# Patient Record
Sex: Female | Born: 1935 | Race: White | Smoking: Former smoker
Health system: Southern US, Community
[De-identification: ages and names within clinical notes are randomized; demographics above are authoritative.]

## PROBLEM LIST (undated history)

## (undated) DIAGNOSIS — I639 Cerebral infarction, unspecified: Secondary | ICD-10-CM

## (undated) DIAGNOSIS — F419 Anxiety disorder, unspecified: Secondary | ICD-10-CM

## (undated) DIAGNOSIS — I2510 Atherosclerotic heart disease of native coronary artery without angina pectoris: Secondary | ICD-10-CM

## (undated) DIAGNOSIS — I10 Essential (primary) hypertension: Secondary | ICD-10-CM

## (undated) DIAGNOSIS — T7840XA Allergy, unspecified, initial encounter: Secondary | ICD-10-CM

## (undated) DIAGNOSIS — K219 Gastro-esophageal reflux disease without esophagitis: Secondary | ICD-10-CM

## (undated) DIAGNOSIS — K5790 Diverticulosis of intestine, part unspecified, without perforation or abscess without bleeding: Secondary | ICD-10-CM

## (undated) DIAGNOSIS — G459 Transient cerebral ischemic attack, unspecified: Secondary | ICD-10-CM

## (undated) DIAGNOSIS — R319 Hematuria, unspecified: Secondary | ICD-10-CM

## (undated) DIAGNOSIS — E785 Hyperlipidemia, unspecified: Secondary | ICD-10-CM

## (undated) DIAGNOSIS — N289 Disorder of kidney and ureter, unspecified: Secondary | ICD-10-CM

## (undated) HISTORY — PX: APPENDECTOMY: SHX54

## (undated) HISTORY — DX: Anxiety disorder, unspecified: F41.9

## (undated) HISTORY — PX: TONSILLECTOMY: SUR1361

## (undated) HISTORY — DX: Allergy, unspecified, initial encounter: T78.40XA

## (undated) HISTORY — DX: Atherosclerotic heart disease of native coronary artery without angina pectoris: I25.10

## (undated) HISTORY — DX: Gastro-esophageal reflux disease without esophagitis: K21.9

## (undated) HISTORY — PX: EYE SURGERY: SHX253

## (undated) HISTORY — DX: Diverticulosis of intestine, part unspecified, without perforation or abscess without bleeding: K57.90

## (undated) HISTORY — PX: CHOLECYSTECTOMY: SHX55

## (undated) HISTORY — PX: ABDOMINAL HYSTERECTOMY: SHX81

## (undated) HISTORY — DX: Transient cerebral ischemic attack, unspecified: G45.9

## (undated) HISTORY — DX: Hyperlipidemia, unspecified: E78.5

## (undated) HISTORY — DX: Cerebral infarction, unspecified: I63.9

## (undated) HISTORY — PX: OTHER SURGICAL HISTORY: SHX169

## (undated) HISTORY — DX: Essential (primary) hypertension: I10

## (undated) HISTORY — DX: Hematuria, unspecified: R31.9

---
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
## 1998-09-22 ENCOUNTER — Encounter: Payer: Self-pay | Admitting: General Surgery

## 1998-09-24 ENCOUNTER — Observation Stay (HOSPITAL_COMMUNITY): Admission: RE | Admit: 1998-09-24 | Discharge: 1998-09-25 | Payer: Self-pay | Admitting: General Surgery

## 1999-02-03 ENCOUNTER — Ambulatory Visit (HOSPITAL_COMMUNITY): Admission: RE | Admit: 1999-02-03 | Discharge: 1999-02-03 | Payer: Self-pay | Admitting: Gastroenterology

## 2000-12-19 ENCOUNTER — Other Ambulatory Visit: Admission: RE | Admit: 2000-12-19 | Discharge: 2000-12-19 | Payer: Self-pay | Admitting: Family Medicine

## 2001-06-06 DIAGNOSIS — I639 Cerebral infarction, unspecified: Secondary | ICD-10-CM

## 2001-06-06 HISTORY — DX: Cerebral infarction, unspecified: I63.9

## 2001-06-25 ENCOUNTER — Emergency Department (HOSPITAL_COMMUNITY): Admission: EM | Admit: 2001-06-25 | Discharge: 2001-06-25 | Payer: Self-pay | Admitting: Emergency Medicine

## 2001-06-25 ENCOUNTER — Encounter: Payer: Self-pay | Admitting: Emergency Medicine

## 2001-06-25 ENCOUNTER — Encounter: Payer: Self-pay | Admitting: Neurology

## 2001-08-23 ENCOUNTER — Observation Stay (HOSPITAL_COMMUNITY): Admission: EM | Admit: 2001-08-23 | Discharge: 2001-08-24 | Payer: Self-pay | Admitting: *Deleted

## 2001-09-06 ENCOUNTER — Encounter: Admission: RE | Admit: 2001-09-06 | Discharge: 2001-09-06 | Payer: Self-pay | Admitting: Family Medicine

## 2004-06-21 ENCOUNTER — Ambulatory Visit (HOSPITAL_COMMUNITY): Admission: RE | Admit: 2004-06-21 | Discharge: 2004-06-21 | Payer: Self-pay | Admitting: Gastroenterology

## 2006-09-25 ENCOUNTER — Ambulatory Visit: Payer: Self-pay | Admitting: Cardiology

## 2006-09-25 LAB — CONVERTED CEMR LAB
BUN: 19 mg/dL (ref 6–23)
Basophils Relative: 1.3 % — ABNORMAL HIGH (ref 0.0–1.0)
CO2: 31 meq/L (ref 19–32)
Calcium: 10.3 mg/dL (ref 8.4–10.5)
GFR calc Af Amer: 52 mL/min
GFR calc non Af Amer: 43 mL/min
INR: 1 (ref 0.9–2.0)
Lymphocytes Relative: 37.5 % (ref 12.0–46.0)
Monocytes Relative: 12.6 % — ABNORMAL HIGH (ref 3.0–11.0)
Neutro Abs: 2.7 10*3/uL (ref 1.4–7.7)
Platelets: 226 10*3/uL (ref 150–400)
Prothrombin Time: 11.9 s (ref 10.0–14.0)
RBC: 4.36 M/uL (ref 3.87–5.11)

## 2006-09-26 ENCOUNTER — Inpatient Hospital Stay (HOSPITAL_BASED_OUTPATIENT_CLINIC_OR_DEPARTMENT_OTHER): Admission: RE | Admit: 2006-09-26 | Discharge: 2006-09-26 | Payer: Self-pay | Admitting: Internal Medicine

## 2006-09-26 ENCOUNTER — Ambulatory Visit: Payer: Self-pay | Admitting: Internal Medicine

## 2006-09-29 ENCOUNTER — Encounter: Payer: Self-pay | Admitting: Cardiology

## 2006-09-29 ENCOUNTER — Ambulatory Visit: Payer: Self-pay | Admitting: Cardiology

## 2006-09-29 ENCOUNTER — Ambulatory Visit (HOSPITAL_COMMUNITY): Admission: RE | Admit: 2006-09-29 | Discharge: 2006-09-29 | Payer: Self-pay | Admitting: Cardiology

## 2006-10-06 ENCOUNTER — Ambulatory Visit: Payer: Self-pay

## 2006-10-06 ENCOUNTER — Encounter: Payer: Self-pay | Admitting: Internal Medicine

## 2006-10-13 ENCOUNTER — Ambulatory Visit: Payer: Self-pay | Admitting: Internal Medicine

## 2007-01-30 ENCOUNTER — Inpatient Hospital Stay (HOSPITAL_COMMUNITY): Admission: EM | Admit: 2007-01-30 | Discharge: 2007-02-01 | Payer: Self-pay | Admitting: Emergency Medicine

## 2007-01-30 ENCOUNTER — Ambulatory Visit: Payer: Self-pay | Admitting: Cardiology

## 2007-02-19 ENCOUNTER — Ambulatory Visit: Payer: Self-pay

## 2008-01-21 LAB — CONVERTED CEMR LAB: LDL Cholesterol: 30 mg/dL

## 2008-07-15 ENCOUNTER — Ambulatory Visit: Payer: Self-pay | Admitting: Cardiology

## 2008-07-15 LAB — CONVERTED CEMR LAB: TSH: 2.14 microintl units/mL (ref 0.35–5.50)

## 2008-07-31 ENCOUNTER — Encounter: Payer: Self-pay | Admitting: Cardiology

## 2008-07-31 ENCOUNTER — Ambulatory Visit: Payer: Self-pay | Admitting: Cardiology

## 2008-07-31 LAB — CONVERTED CEMR LAB
Cholesterol: 116 mg/dL
LDL Cholesterol: 30 mg/dL

## 2008-08-07 ENCOUNTER — Ambulatory Visit: Payer: Self-pay

## 2008-09-22 DIAGNOSIS — Z8673 Personal history of transient ischemic attack (TIA), and cerebral infarction without residual deficits: Secondary | ICD-10-CM | POA: Insufficient documentation

## 2008-09-22 DIAGNOSIS — I10 Essential (primary) hypertension: Secondary | ICD-10-CM

## 2008-09-22 DIAGNOSIS — K5730 Diverticulosis of large intestine without perforation or abscess without bleeding: Secondary | ICD-10-CM | POA: Insufficient documentation

## 2008-09-22 DIAGNOSIS — E785 Hyperlipidemia, unspecified: Secondary | ICD-10-CM

## 2008-09-22 DIAGNOSIS — I6350 Cerebral infarction due to unspecified occlusion or stenosis of unspecified cerebral artery: Secondary | ICD-10-CM | POA: Insufficient documentation

## 2008-09-22 DIAGNOSIS — K219 Gastro-esophageal reflux disease without esophagitis: Secondary | ICD-10-CM

## 2008-09-22 DIAGNOSIS — I2510 Atherosclerotic heart disease of native coronary artery without angina pectoris: Secondary | ICD-10-CM | POA: Insufficient documentation

## 2008-09-24 ENCOUNTER — Ambulatory Visit: Payer: Self-pay | Admitting: Cardiology

## 2008-10-09 ENCOUNTER — Encounter: Payer: Self-pay | Admitting: Cardiology

## 2008-10-09 ENCOUNTER — Ambulatory Visit: Payer: Self-pay

## 2008-10-22 ENCOUNTER — Ambulatory Visit: Payer: Self-pay | Admitting: Cardiology

## 2008-10-23 ENCOUNTER — Telehealth: Payer: Self-pay | Admitting: Cardiology

## 2008-12-10 ENCOUNTER — Ambulatory Visit: Payer: Self-pay | Admitting: Cardiology

## 2009-04-02 ENCOUNTER — Encounter (INDEPENDENT_AMBULATORY_CARE_PROVIDER_SITE_OTHER): Payer: Self-pay | Admitting: *Deleted

## 2009-05-18 ENCOUNTER — Encounter: Admission: RE | Admit: 2009-05-18 | Discharge: 2009-05-18 | Payer: Self-pay | Admitting: Neurology

## 2009-06-30 ENCOUNTER — Inpatient Hospital Stay (HOSPITAL_COMMUNITY)
Admission: EM | Admit: 2009-06-30 | Discharge: 2009-07-02 | Payer: Self-pay | Source: Home / Self Care | Admitting: Emergency Medicine

## 2009-12-02 ENCOUNTER — Ambulatory Visit: Payer: Self-pay | Admitting: Cardiology

## 2010-06-30 LAB — HEMOGLOBIN AND HEMATOCRIT, BLOOD
HCT: 37.3 % (ref 36.0–46.0)
Hemoglobin: 12.5 g/dL (ref 12.0–15.0)

## 2010-06-30 LAB — BASIC METABOLIC PANEL
Calcium: 10.3 mg/dL (ref 8.4–10.5)
Creatinine, Ser: 1.02 mg/dL (ref 0.4–1.2)
GFR calc Af Amer: >60 mL/min (ref >60)
GFR calc non Af Amer: 53 mL/min — ABNORMAL LOW (ref >60)

## 2010-07-08 ENCOUNTER — Ambulatory Visit (HOSPITAL_COMMUNITY)
Admission: RE | Admit: 2010-07-08 | Discharge: 2010-07-08 | Disposition: A | Discharge status: Home / Self Care | Payer: No Typology Code available for payment source | Attending: Ophthalmology | Admitting: Ophthalmology

## 2010-07-08 DIAGNOSIS — Z79899 Other long term (current) drug therapy: Secondary | ICD-10-CM | POA: Insufficient documentation

## 2010-07-08 DIAGNOSIS — H2510 Age-related nuclear cataract, unspecified eye: Secondary | ICD-10-CM | POA: Insufficient documentation

## 2010-07-08 DIAGNOSIS — Z7982 Long term (current) use of aspirin: Secondary | ICD-10-CM | POA: Insufficient documentation

## 2010-07-08 DIAGNOSIS — I10 Essential (primary) hypertension: Secondary | ICD-10-CM | POA: Insufficient documentation

## 2010-08-12 ENCOUNTER — Encounter (HOSPITAL_COMMUNITY): Payer: No Typology Code available for payment source | Attending: Ophthalmology

## 2010-08-12 DIAGNOSIS — Z01818 Encounter for other preprocedural examination: Secondary | ICD-10-CM | POA: Insufficient documentation

## 2010-08-16 ENCOUNTER — Ambulatory Visit (HOSPITAL_COMMUNITY)
Admission: RE | Admit: 2010-08-16 | Discharge: 2010-08-16 | Disposition: A | Discharge status: Home / Self Care | Payer: No Typology Code available for payment source | Source: Ambulatory Visit | Attending: Ophthalmology | Admitting: Ophthalmology

## 2010-08-16 DIAGNOSIS — I10 Essential (primary) hypertension: Secondary | ICD-10-CM | POA: Insufficient documentation

## 2010-08-16 DIAGNOSIS — Z79899 Other long term (current) drug therapy: Secondary | ICD-10-CM | POA: Insufficient documentation

## 2010-08-16 DIAGNOSIS — H2510 Age-related nuclear cataract, unspecified eye: Secondary | ICD-10-CM | POA: Insufficient documentation

## 2010-08-22 LAB — DIFFERENTIAL
Basophils Absolute: 0 10*3/uL (ref 0.0–0.1)
Basophils Relative: 0 % (ref 0–1)
Neutro Abs: 4 10*3/uL (ref 1.7–7.7)
Neutrophils Relative %: 55 % (ref 43–77)

## 2010-08-22 LAB — BASIC METABOLIC PANEL
BUN: 11 mg/dL (ref 6–23)
Calcium: 9.6 mg/dL (ref 8.4–10.5)
GFR calc non Af Amer: 54 mL/min — ABNORMAL LOW (ref >60)
Potassium: 4.9 mEq/L (ref 3.5–5.1)
Sodium: 142 mEq/L (ref 135–145)

## 2010-08-22 LAB — CBC
HCT: 31.3 % — ABNORMAL LOW (ref 36.0–46.0)
HCT: 31.8 % — ABNORMAL LOW (ref 36.0–46.0)
HCT: 36.3 % (ref 36.0–46.0)
Hemoglobin: 10.3 g/dL — ABNORMAL LOW (ref 12.0–15.0)
Hemoglobin: 12.1 g/dL (ref 12.0–15.0)
Platelets: 230 10*3/uL (ref 150–400)
RBC: 3.58 MIL/uL — ABNORMAL LOW (ref 3.87–5.11)
RDW: 15.8 % — ABNORMAL HIGH (ref 11.5–15.5)
WBC: 5 10*3/uL (ref 4.0–10.5)
WBC: 7.3 10*3/uL (ref 4.0–10.5)

## 2010-08-22 LAB — COMPREHENSIVE METABOLIC PANEL
ALT: 13 U/L (ref 0–35)
Alkaline Phosphatase: 79 U/L (ref 39–117)
Alkaline Phosphatase: 88 U/L (ref 39–117)
BUN: 12 mg/dL (ref 6–23)
BUN: 17 mg/dL (ref 6–23)
CO2: 25 mEq/L (ref 19–32)
CO2: 27 mEq/L (ref 19–32)
Chloride: 101 mEq/L (ref 96–112)
Chloride: 107 mEq/L (ref 96–112)
GFR calc non Af Amer: 57 mL/min — ABNORMAL LOW (ref >60)
Glucose, Bld: 113 mg/dL — ABNORMAL HIGH (ref 70–99)
Glucose, Bld: 116 mg/dL — ABNORMAL HIGH (ref 70–99)
Potassium: 3.8 mEq/L (ref 3.5–5.1)
Potassium: 4.3 mEq/L (ref 3.5–5.1)
Total Bilirubin: 0.2 mg/dL — ABNORMAL LOW (ref 0.3–1.2)
Total Bilirubin: 0.4 mg/dL (ref 0.3–1.2)
Total Protein: 6.1 g/dL (ref 6.0–8.3)

## 2010-10-19 NOTE — Assessment & Plan Note (Signed)
[facility]                            CARDIOLOGY OFFICE NOTE   NAME:Amanda [name], Amanda [name]                           MRN:          [id number]  DATE:12/10/2008                            DOB:          03/01/1931    REASON FOR PRESENTATION:  Evaluate the patient with hypertrophic  cardiomyopathy and shortness of breath.   HISTORY OF PRESENT ILLNESS:  The patient returns for followup of the  above.  At the last appointment, I stopped her Diovan and started  verapamil as she was complaining of dyspnea.  She says she feels much  better with this.  She is able to do her volunteering without the  limitation she was having.  She has had no presyncope or  lightheadedness.  She thinks she would be able to walk 50 yards on a  football field without getting dyspneic.  The only time she has a  problem is if she comes in from the heat and walks up some stairs she  may get some chest discomfort that goes away quickly when she stops.  She is otherwise not having any resting complaints.   PAST MEDICAL HISTORY:  1. Hypertrophic cardiomyopathy (EF 65-70%).  Peak outflow gradient 225      with Valsalva.  This was on echo in May 2010).  2. Coronary artery disease (see the Oct 22, 2008, note for details).  3. Hypertension.  4. Hyperlipidemia.  5. CVA in 2003 with left MCA occlusion.  6. Diverticulosis.  7. Gastroesophageal reflux disease.  8. Cholecystectomy.  9. Hysterectomy.  10.Appendectomy.  11.Tonsillectomy.   ALLERGIES:  PENICILLIN.   MEDICATIONS:  1. Aspirin 81 mg daily.  2. Lipitor 40 mg daily.  3. Fish oil.  4. Omeprazole.  5. Vitamin D.  6. Verapamil 120 mg daily.   REVIEW OF SYSTEMS:  As stated in the HPI and otherwise negative for all  other systems.   PHYSICAL EXAMINATION:  GENERAL:  The patient is in no distress.  VITAL SIGNS:  Blood pressure 130/66, heart rate 73 and regular, weight  150 pounds.  NECK:  No jugular venous distention at 45 degrees,  carotid upstroke  brisk and symmetric, no bruits, no thyromegaly.  LYMPHATICS:  No adenopathy.  LUNGS:  Clear to auscultation bilaterally.  HEART:  PMI not displaced or sustained, S1 and S2 within normal limits,  no S3, 2/6 apical systolic murmur, no diastolic murmurs.  ABDOMEN:  Flat, positive bowel sounds normal in frequency and pitch, no  bruits, no rebound, no guarding, no midline pulsatile mass, no  hepatomegaly, no splenomegaly.  SKIN:  No rashes, no nodules.  EXTREMITIES:  Pulses 2+, no edema, no cyanosis, no clubbing.  NEUROLOGIC:  Grossly intact throughout.   ASSESSMENT AND PLAN:  1. Hypertrophic cardiomyopathy.  The patient seems to be improved on      the current dose of verapamil female.  At this point, I will leave it      where it is though we could go up if she has continued symptoms.      She will let me know.  She will otherwise remain on the meds as      listed.  2. Hypertension.  Blood pressure seems to be controlled with this dose      of verapamil.  She will continue on this.  3. Coronary disease.  She will continue with risk reduction.  At this      point, no further cardiovascular testing is suggested.  She had no      scar or ischemia on Cardiolite in March 2010.   FOLLOWUP:  I will see her back in 6 months or sooner if needed.     Rollene Rotunda, MD, [facility]  Electronically Signed    JH/MedQ  DD: 12/10/2008  DT: 12/10/2008  Job #: [id number]   cc:   Ernestina Penna, M.D.

## 2010-10-19 NOTE — Assessment & Plan Note (Signed)
[facility]                            CARDIOLOGY OFFICE NOTE   Amanda [name], Amanda [name]                           MRN:          [id number]  DATE:09/24/2008                            DOB:          September 09, 1930    PRIMARY CARE PHYSICIAN:  Ernestina Penna, MD   REASON FOR PRESENTATION:  Evaluate the patient with shortness of breath  and chest discomfort.   HISTORY OF PRESENT ILLNESS:  The patient presents for followup of the  above.  When I last saw her in February I sent her for stress perfusion  study to evaluate her known coronary artery disease and the above  symptoms.  There was no evidence of scar or ischemia.  Her EF was 83%,  which was normal.  She states she is still having symptoms such as  dyspnea with any exertion such as walking through her yard.  Her heart  will race.  She does have some chest discomfort, it is a heaviness.  She  has to stop which she is doing and it will go away.  It is sporadic.  It  is not always reproducible.  She does not have any resting complaints.  She is not describing any PND or orthopnea.  She is not having any  palpitations.  Denies any presyncope or syncope.   PAST MEDICAL HISTORY:  Coronary artery disease (LAD 30% followed by 60%  stenosis.  She had a pressure wire measurement demonstrating no  significant gradient.  The circumflex had 30% stenosis, the right  coronary artery had 40% stenosis.  The EF was 75-80%), hypertension,  hyperlipidemia, mild focal basal septal hypertrophy noted on echo in  2008, CVA in 2003 with a left MCA occlusion, diverticulosis,  gastroesophageal reflux disease, cholecystectomy, hysterectomy,  appendectomy, and tonsillectomy.   ALLERGIES:  PENICILLIN.   MEDICATIONS:  1. Aspirin 81 mg daily.  2. Lipitor 40 mg daily.  3. Fish oil.  4. Omeprazole.  5. Vitamin D.  6. Diovan 160/12.5.   REVIEW OF SYSTEMS:  As stated in the HPI and otherwise negative for all  other systems.   PHYSICAL EXAMINATION:  GENERAL:  The patient is pleasant and in no  distress.  VITAL SIGNS:  Blood pressure 128/78, heart rate 87 and regular.  HEENT:  Eyelids are unremarkable; pupils are equal, round, and reactive  to light; fundi not visualized; oral mucosa unremarkable.  NECK:  No jugular venous distention at 45 degrees, carotid upstroke  brisk and symmetric, no bruits, no thyromegaly.  LYMPHATICS:  No cervical, axillary, or inguinal adenopathy.  LUNGS:  Clear to auscultation bilaterally.  BACK:  No costovertebral angle tenderness.  CHEST:  Unremarkable.  HEART:  PMI not displaced or sustained; S1 and S2 within normal limits,  no S3, no S4; 3/6 apical systolic murmur radiating slightly at the  aortic outflow tract and holosystolic, no diastolic murmurs.  ABDOMEN:  Flat; positive bowel sounds, normal in frequency and pitch; no  bruits, no rebound, no guarding, no midline pulsatile mass; no  hepatomegaly, no splenomegaly.  SKIN:  No rashes, no nodules.  EXTREMITIES:  Pulses 2+, no edema.   ASSESSMENT AND PLAN:  1. Chest discomfort and dyspnea.  This is still not explained.  She      does not have an abnormal stress perfusion study.  I do not think      catheterization is indicated.  I wonder if this could be related to      some basal septal hypertrophy.  She has more prominent systolic      murmur than I had appreciated.  At this point, I am going to get an      echocardiogram to further evaluate this.  Ultimately, she may need      heart catheterization to sort out hemodynamics as well as to      definitively exclude coronary artery disease progression.  2. Hypertension.  Blood pressure is controlled.  She will continue the      meds as listed.  3. Dyslipidemia.  This is followed by Dr. Christell Constant.  I will make no      change in her medical regimen.  4. Hypertrophic cardiomyopathy as above.  I reviewed the 2008 echo and      we will repeat another.  5. Coronary artery disease.  She  will continue with risk reduction.  6. Follow up.  I will see her back in 6 months or sooner based on      advancing symptoms or the results of the above testing.     Rollene Rotunda, MD, [facility]  Electronically Signed    JH/MedQ  DD: 09/24/2008  DT: 09/25/2008  Job #: [id number]   cc:   Ernestina Penna, M.D.

## 2010-10-19 NOTE — Discharge Summary (Signed)
Amanda [name], [name] NO.:  [id number]   MEDICAL RECORD NO.:  [id number]          PATIENT TYPE:  INP   LOCATION:  2004                         FACILITY:  [facility]   PHYSICIAN:  Luis Abed, MD, FACCDATE OF BIRTH:  1931/03/03   DATE OF ADMISSION:  01/30/2007  DATE OF DISCHARGE:  02/01/2007                         DISCHARGE SUMMARY - REFERRING   DISCHARGE PHYSICIAN:  Luis Abed, MD, [facility]   DISCHARGE DIAGNOSES:  1. Near syncope of uncertain etiology.  2. Hyperlipidemia.  3. History as listed below.   SUMMARY OF HISTORY:  Amanda [name] is a 77 year old female who has spells that  present suddenly.  According to the H&P, they are presyncope.  However,  she denies dizziness.  These spells are followed by extreme chest  pressure, starting in the right, moving to the center of the chest and  then goes into the left eye.  Resolves without medications.  Associated  with right arm heaviness.  Each episode lasts about 10 seconds.  She has  not lost consciousness or fallen, but she feels like she is going to  fall if she does not grab something.  She has 3-4 episodes a day, and  this has been going on since May 2008.   PAST MEDICAL HISTORY:  1. Hypertension.  2. Hyperlipidemia.  3. CVA in 2003 with left MCA branch totaled.  4. Diverticulosis.  5. GERD.  6. Coronary artery disease in April 2008.  Catheterization showed      nonobstructive coronary disease with EF of 75-80%.   ALLERGIES:  PENICILLIN.   STUDIES:  Chest x-ray on August 26 showed no acute disease processes.  EKG showed normal sinus rhythm, normal axis, inferior Q waves, early R  waves, nonspecific ST-T wave changes.   LABORATORY DATA:  On August 26, H&H was 13.3 and 38.8.  Normal indices.  Platelets 247, WBC 7.0, PTT 28, PT 12.7.  Sodium 142, potassium 3.6, BUN  18, creatinine 1.05, glucose 97.  On August 27, sodium was 143,  potassium 3.9, BUN 21, creatinine 1.14, glucose 102, normal LFTs.  CK  MB,  relative index and troponins were within normal limits.  Fasting  lipids showed a total cholesterol of 108, triglycerides 172, HDL 36, LDL  38, TSH was 2.227.  Stools were heme negative x2.  During her  hospitalization telemetry showed sinus bradycardia, normal sinus rhythm  without ectopy.   HOSPITAL COURSE:  The patient was admitted February 2000 by Theodore Demark and Dr. Juanda Chance.  Overnight, she did not have any near syncope  symptoms.  She did have a brief episode of chest discomfort.  She ruled  out for myocardial infarction, and telemetry did not show any evidence  of dysrhythmia.  Dr. Myrtis Ser felt that if she remained another 24 hours  without symptoms or evidence of dysrhythmias on telemetry, she could be  discharged home with outpatient follow up.  By August 28, the patient  states that she felt better.  Telemetry again did not show any evidence  of dysrhythmias.  Dr. Myrtis Ser felt that she could be  discharged home with  outpatient follow up.   DISPOSITION:  The patient is discharged home.  She is asked to continue  her home medications.  These include Prevacid 30 mg p.o. daily, Diovan  HCT 160/12.5 daily, Lipitor 40 mg q.h.s.  She was also asked to begin  aspirin 81 mg daily.  Our office will call her with arrangements for an  event monitor and an adenosine Myoview to further evaluate her symptoms.  After these tests are performed, she will follow up with Dr. Diona Browner.  She was asked not to drive in the interim.  She will bring all  medications to all appointments.  She was asked also to follow up with  [facility] as scheduled.   TIME ON DISCHARGE:  Thirty minutes.      Joellyn Rued, PA-C      Luis Abed, MD, [facility]  Electronically Signed    EW/MEDQ  D:  02/01/2007  T:  02/01/2007  Job:  [id number]   cc:   [facility]  Jonelle Sidle, MD

## 2010-10-19 NOTE — H&P (Signed)
[name], [name] NO.:  [id number]   MEDICAL RECORD NO.:  [id number]          PATIENT TYPE:  INP   LOCATION:  2004                         FACILITY:  [facility]   PHYSICIAN:  Everardo Beals. Juanda Chance, MD, FACCDATE OF BIRTH:  [birth year]   DATE OF ADMISSION:  01/30/2007  DATE OF DISCHARGE:                              HISTORY & PHYSICAL   PRIMARY CARE PHYSICIAN:  Lindaann Pascal, PAC at [facility]   PRIMARY CARDIOLOGIST:  Jonelle Sidle, MD   CHIEF COMPLAINT:  Presyncope/chest pain.   HISTORY OF PRESENT ILLNESS:  Amanda [name] is a 77 year old female with a  history of moderate coronary artery disease.  She was catheterized in  April 2008 for symptoms that she describes as presyncope followed by  chest pain.  Her symptoms improved briefly after her for  catheterization, but in May 2008 she was having episodes 3-4 times a  day.   Each episode starts out as presyncope.  She denies dizziness.  She  states that the symptoms come on suddenly and are not associated with  exertion as they also occur at rest.  Each episode of presyncope is  followed by extreme chest pressure that starts in the right and moves to  the center of her chest and then goes to the left eye.  It then resolves  without medications.  There is associated right arm heaviness.  Each  episode lasts less than 10 seconds.  She has not fallen and has not had  complete syncope.  Her symptoms are exertional and nonexertional.  Each  episode is exactly the same.  She does feel like she may fall if she  does not grab on to something, but if she is standing and she grabs onto  something, then she does not completely lose consciousness and has not  fallen.  Currently she is symptom free.   PAST MEDICAL HISTORY:  1. Status post cardiac catheterization April 2008 showing an LAD 30%      and 60% (FFR 0.915 pressure wire), circumflex 30%, RCA 40%, EF 75-      80%.  2. Hypertension.  3.  Hyperlipidemia.  4. Family history of coronary artery disease  5. History of CVA in 2003 with the left MCA branch totaled.  6. Diverticulosis.  7. Gastroesophageal reflux disease.   SURGICAL HISTORY:  She is status post cholecystectomy, hysterectomy,  appendectomy, tonsillectomy, colonoscopy and cardiac cath.   ALLERGIES:  Penicillin.   CURRENT MEDICATIONS:  1. Prevacid 30 mg day.  2. Diovan/HCT 160/12.5 daily.  3. Lipitor 20 mg a day.   SOCIAL HISTORY:  She lives in [place] with her son.  She is retired from  [employer].  She has been widowed about 3 years.  She quit smoking about 25  years ago, was an approximately 10 pack-year history, and has never  abused alcohol or drugs.   FAMILY HISTORY:  Mother died at age 56.  Father died at age 22.  Neither  one had heart disease.  She has one brother and one sister, one died of  an MI in her 29s, one died of an MI in his early 45s.   REVIEW OF SYSTEMS:  She has vision loss and wears glasses.  She has  chronic dyspnea on exertion as well as orthopnea and says that she also  has PND daily.  She has palpitations.  Presyncope is described above.  She coughs daily which is nonproductive.  She feels that she does have  some issues with depression and anxiety.  She has chronic arthralgias.  She has alternating diarrhea and constipation as well as reflux symptoms  and occasional abdominal pain.   A 14 point review of systems is otherwise negative.   PHYSICAL EXAMINATION:  VITAL SIGNS:  Temperature is 97.1, blood pressure  133/73, pulse 55, respiratory 15, O2 saturation 96% on room air.  GENERAL:  She is a well-developed, well-nourished white female in no  acute distress.  HEENT is normal.  NECK:  There is no lymphadenopathy, thyromegaly, bruit or JVD noted.  CVA:  Heart is regular in rate and rhythm with an S1, S2 and S4, but no  significant murmur or rub is noted.  Distal pulses are intact in all  four extremities and no femoral bruits are  appreciated.  LUNGS:  Clear to auscultation bilaterally.  SKIN:  No rashes or lesions are noted.  ABDOMEN:  Soft, nontender with active bowel sounds.  EXTREMITIES:  No cyanosis, clubbing or edema noted.  MUSCULOSKELETAL:  No joint deformity, No spine or CVA tenderness.  NEUROLOGICAL:  She is alert and oriented.  Cranial nerves II through XII  grossly intact.   STUDIES:  Chest x-ray no acute disease.   EKG:  Sinus rhythm rate 66 with no acute ischemic changes.   LABORATORY VALUES:  Point care markers negative times one.  CK-MB,  troponin negative times one.  Hemoglobin 13.3, hematocrit 38.3, WBC 7.8,  platelets 247.  Sodium 42, potassium 3.6, chloride 104, CO2 28, BUN 18,  creatinine 0.05, glucose 98.   IMPRESSION:  1. Palpitations/presyncope/chest pain:  Her symptoms are suspicious      for cardiac arrhythmia.  We will admit and rule out MI.  She will      be monitored over night.  If cardiac enzymes are negative,      consideration can be given to      discharge with an outpatient Myoview and event monitor with      subsequent follow up.  She will be continued on her home      medications without the addition of beta blocker because of resting      bradycardia.  We will check a magnesium level and TSH .  Will also      check a fasting lipid profile.      Theodore Demark, PA-C      Bruce R. Juanda Chance, MD, [facility]  Electronically Signed    RB/MEDQ  D:  01/30/2007  T:  01/31/2007  Job:  [id number]   cc:   [facility] [place], [place]

## 2010-10-19 NOTE — Assessment & Plan Note (Signed)
[facility]ALTHC[facility]                            CARDIOLOGY OFFICE NOTE   Amanda [name], Amanda [name]                           MRN:          [id number]  DATE:10/22/2008                            DOB:          [birth year]    PRIMARY PHYSICIAN:  Dr. Rudi [name].   REASON FOR PRESENTATION:  Evaluate the patient with shortness of breath  and hypertropic cardiomyopathy.   HISTORY OF PRESENT ILLNESS:  The patient returns for followup.  When I  last saw her on the 21st of April, she was complaining of dyspnea  walking across her yard.  She seems to be changing the story a little  bit.  She states she cannot walk up a flight of stairs without being  short of breath.  She does think she will be able to walk 50 yards on  level ground without any severe symptoms.  However, she does  occasionally also get some episodes of feeling lightheaded.  This  happens occasionally with activity.  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  It lasted for only a second.  She is having some  fleeting chest discomfort but nothing reproducible or in an accelerated  pattern.  She is not having any substernal chest pressure, neck or arm  discomfort.  She is not describing any PND or orthopnea.   I did an echocardiogram earlier this month and it suggests that her  hypertropic cardiomyopathy with septal hypertrophy has increased.  Her  EF is 65-70%.  With the Valsalva maneuver, she had a peak outflow  gradient of 225.  I brought her back to discuss this finding and the  symptoms.   PAST MEDICAL HISTORY:  1. Coronary artery disease (LAD 30% followed by 60% stenosis.  She had      a pressure wire measurement demonstrating no significant gradient.      The circumflex had 30% stenosis.  The right coronary artery had 40%      stenosis.  The EF was 75-80%.  The patient had a stress perfusion      study in February  demonstrating an EF of 83% with no ischemia or      scar), hypertension, hyperlipidemia, basal septal hypertrophy as      described, CVA in 2003 with left MCA occlusion, diverticulosis,      gastroesophageal reflux disease, cholecystectomy, hysterectomy,      appendicectomy, and tonsillectomy.   ALLERGIES:  PENICILLIN.   MEDICATIONS:  1. Aspirin 81 mg.  2. Lipitor 40 mg.  3. Fish oil.  4. Omeprazole 20 mg daily.  5. Vitamin D.  6. Diovan 160/12.5.   REVIEW OF SYSTEMS:  As stated in the HPI and otherwise negative for all  other systems.   PHYSICAL EXAMINATION:  GENERAL:  The patient is pleasant and in no  distress.  VITAL SIGNS:  Blood pressure 138/72, heart rate 73 and regular, weight  152 pounds, and body mass index 29.  HEENT:  Eyelids unremarkable; pupils equal, round,  and reactive to  light; fundi not visualized; oral mucosa unremarkable.  NECK:  No jugular venous distension at 45 degrees, carotid upstroke  brisk and symmetrical, no bruits, no thyromegaly.  LYMPHATICS:  No cervical, axillary, or inguinal adenopathy.  LUNGS:  Clear to auscultation bilaterally.  BACK:  No costovertebral angle tenderness.  CHEST:  Unremarkable.  HEART:  PMI not displaced or sustained, S1 and S2 within normal limits,  no S3, no S4, no clicks, no rubs, no murmurs, 2/6 apical systolic murmur  radiating slightly at the aortic outflow tract and holosystolic, no  diastolic murmurs.  ABDOMEN:  Flat; positive bowel sounds, normal in frequency and pitch; no  bruits; no rebound; no guarding; no midline pulsatile mass; no  hepatomegaly; no splenomegaly.  SKIN:  No rashes, no nodules.  EXTREMITIES:  2+ pulses throughout, no edema, no cyanosis, no clubbing.  NEURO:  Oriented to person, place, and time.  Cranial nerves II through  XII grossly intact, motor grossly intact.   ASSESSMENT AND PLAN:  1. Hypertropic cardiomyopathy.  The patient does have a significant      gradient.  She has some symptoms  though it is a little bit      difficult to gauge these.  At this point, I do think it is prudent      to treat her blood pressure with verapamil, to also manage her      hypertropic cardiomyopathy.  Therefore, I am going to discontinue      the Diovan HCT and start sustained-release verapamil 120 mg daily.      I am going to see if this improves her symptoms as well as controls      her blood pressure.  2. Hypertension.  As above.  3. Dyslipidemia.  This is followed by Dr. Christell [name].  Given her      nonobstructive coronary disease, I do think that aggressive risk      reduction is warranted.  The goal should be an LDL less than 100      and HDL greater than 50.  4. Coronary artery disease.  As described.  She had a recent stress      perfusion study and no new symptoms suggestive of worsening plaque.      She will be managed with risk reduction.  5. Followup.  I would like to see her again in 2 months or sooner if      she has any problems with this change in medications or further      symptoms.     Amanda Rotunda, MD, [facility]  Electronically Signed    JH/MedQ  DD: 10/22/2008  DT: 10/23/2008  Job #: [id number]   cc:   Amanda [name], M.D.

## 2010-10-19 NOTE — Assessment & Plan Note (Signed)
HEALTHCARE                             PULMONARY OFFICE NOTE   NAME:Amanda [name], Amanda [name]                           MRN:          [id number]  DATE:10/13/2006                            DOB:          10-25-1930    CHIEF COMPLAINT:  Dyspnea.   HISTORY:  Delightful 77 year old white female with minimum smoking  history who presents paroxysms of dyspnea lasting from a few seconds to  several minutes going back 7-1/2 years now.  It has been much worse over  the last several months associated with worsening reflux symptoms since  she started a bone pill (she does not know what it is).  She describes  gagging and choking during these spells and feeling like she is going to  pass out.  She underwent a left heart cath because of this symptom plus  chest pain with near syncope and was found to have an isolated lesion  for which she underwent intervention on April 22 but still feels this  helps only a little.  Interestingly, inbetween these spells she has  excellent exercise tolerance with no reproducible dyspnea or chest pain  with exertion.  She has these spells both at rest and while lying down,  they may be a bit worse when she eats.  However, she denies any history  of pneumonia or aspiration syndromes.   Note that she has had a stroke in the past and does have hypertension,  has been on chronic ACE inhibitors but she does not know how long she  has been taking them.   PAST MEDICAL HISTORY:  Significant for hypertension and previous stroke.  Also she is status post cholecystectomy, remote colon surgery and  angioplasty, hysterectomy, and appendectomy.   ALLERGIES:  NONE KNOWN.   MEDICATIONS:  Taken in detail on the worksheet, significant for the fact  that she takes fish oil, moderate doses of beta blocker, and Zantac  twice a day which she says does not help her heartburn.   SOCIAL HISTORY:  She quit smoking 50 years ago.  She is retired.  She  took care of  her husband who died of bone cancer and has been a widow  now for several years, but feels like she has put this all behind her.   FAMILY HISTORY:  Positive for heart disease in her father, negative for  respiratory disease.   REVIEW OF SYSTEMS:  Taken in details on the worksheet, negative except  as outlined above.   PHYSICAL EXAMINATION:  This is a pleasant, ambulatory white female in no  acute distress.  She is afebrile, normal vital signs.  HEENT:  She is edentulous, oropharynx is clear, nasal turbinates normal.  NECK:  Supple without cervical adenopathy, tenderness, trachea is  midline, no thyromegaly.  LUNGS:  Fields reveal classic pseudo-wheeze only, overall air movement  is excellent.  There is minimal kyphotic deformity.  Regular rhythm without murmur, gallop, rub.  ABDOMEN:  Soft, benign with normal excursion in the supine position.  EXTREMITIES:  Warm without calf tenderness, cyanosis, clubbing, edema.   LABORATORY DATA:  Was reviewed from Dr. Kathi Der office indicating a  bicarb level of 23, normal hematocrit dated October 04, 2006 and a chest x-  ray dated April 18 showing mild elevation of the right hemidiaphragm,  unclear chronicity.  Echocardiogram was reviewed from Oct 06, 2006 and  shows normal left atrial size, normal right pressures with normal LV  function.   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  That is, the reflux  sets off upper airway inflammation which in turn goes unabated when  angiotensin converting enzyme inhibitors block the metabolism of upper  airway bradykinin, which is released from the inflammation of reflux in  a cyclical fashion.   This is the only thing  I know that would cause such paroxysms of dyspnea  at rest, associated with gagging and coughing that would resolve without  specific treatment leaving the patient with excellent exercise tolerance  between spells.   I therefore recommended the following:  1. Continue Zantac but take 1 dose with breakfast and 1 at bedtime and      institute a GERD diet (apparently she uses peppermint to control      come of her symptoms which is obviously not a good idea if she is      refluxing).  2. Stop the bone pill for now (I suspect it is biphosphonate which      is aggravating the problem).  3. Stop Altace and fish oil (because if she is refluxing, acid and oil      are both irritating to the upper airway).  4. And also stop Microzide and replace it with Diovan 160/12.5 one      daily (Dr. Antoine Poche feels that this is a reasonable substitution      for Altace in the setting of a patient with known ischemic heart      disease).  5. Follow up with Scott Long in 3 weeks.  If not improving I would      like to see her back here in [place].  If she is improving I      would simply avoid ACE inhibitors, and take caution with adding      back either fish oil or bisphosphonates to be on the lookout for      similar exacerbation symptoms.   One other option might be to treat her more aggressively for reflux but  my experience has been that once the ACE inhibitor is removed, the  reflux is much easier to manage.  If not, a trial of PPI therapy, (even  with Reglan) might be considered in this setting.   I have not addressed the elevation of the right hemidiaphragm which  appears to be chronic and obviously is not limiting this patient's  activity level.  If her x-rays in [place] show the same elevation  chronically then nothing further needs to be done.  If it is a new  elevation I would consider a CT scan of the chest and a fluoroscopy of the right hemidiaphragm to complete the  workup, but I will  defer this to Dr. Kathi Der and Lorin Picket Long's capable  hands and see her on a p.r.n. basis.     Casimiro Needle  Denice Paradise, MD, [facility]  Electronically Signed    MBW/MedQ  DD: 10/13/2006  DT: 10/13/2006  Job #: [id number]   cc:   Vernon Prey, MD

## 2010-10-19 NOTE — Assessment & Plan Note (Signed)
[facility] HEALTHCARE                            CARDIOLOGY OFFICE NOTE   Amanda [name], Amanda [name]                           MRN:          [id number]  DATE:07/15/2008                            DOB:          March 20, 1931    PRIMARY CARE PHYSICIAN:  Ernestina Penna, MD   REASON FOR PRESENTATION:  Evaluate the patient with presyncope.   HISTORY OF PRESENT ILLNESS:  The patient is now 77 years old.  We saw  her in 2008 for evaluation of chest pain.  She had catheterization  demonstrating disease as described below.  However, she was not  scheduled for followup.  She had been managed medically by her primary  care doctor since that time.  She is now referred back for evaluation of  presyncope.   She describes these episodes as happening from 3-4 years and in fact,  they are mentioned in previous notes.  However, they become more  frequent.  She says it happening daily multiple times a day.  It can  happen when she is sitting, standing, or walking.  She cannot bring them  on.  She does not lose consciousness and has had no frank syncope.  However, she says that she will feel things go gray.  It only last for a  second.  She always feels heart palpitations when it happens.  She may  get some chest pressure as well.  This happens after the episodes pass.  This last from a few seconds.  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  With  activity, she cannot bring on any chest pressure, neck or arm  discomfort.  She does not have any PND or orthopnea.   She had an MRI apparently which was normal.  She did wear an event  monitor, which demonstrated episodes of PVCs when she was having the  dizzy spells and the chest pain.   PAST MEDICAL HISTORY:  1. Coronary artery disease (LAD 30% followed by 60% stenosis.      Pressure wire measurement demonstrated no significant gradient.      The circumflex had 30% stenosis, the  right coronary artery had 40%      stenosed.  The EF was 75% to 80%).  2. Hypertension.  3. Hyperlipidemia.  4. 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  5. Diverticulosis.  6. Gastroesophageal reflux disease.   PAST SURGICAL HISTORY:  Cholecystectomy, hysterectomy, appendectomy, and  tonsillectomy.   ALLERGIES/INTOLERANCE:  PENICILLIN.   MEDICATIONS:  1. Omeprazole 20 mg daily.  2. Fish oil.  3. Lipitor 40 mg daily.  4. Aspirin 81 mg daily.   REVIEW OF SYSTEMS:  As stated in the HPI and otherwise negative for  other systems.   PHYSICAL EXAMINATION:  GENERAL:  The patient is pleasant in no distress.  VITAL SIGNS:  Blood pressure 133/60, heart rate 75 and regular, weight  153 pounds, and body mass index 29.  HEENT:  Eyelids unremarkable.  Pupils equal, round, and reactive to  light.  Fundi not visualized.  Oral mucosa unremarkable.  Edentulous,  dentures.  NECK:  No jugular venous distension at 45 degrees.  Carotid upstroke  brisk and symmetric.  No bruits.  No thyromegaly.  LYMPHATICS:  No cervical, axillary, or inguinal adenopathy.  LUNGS:  Clear to auscultation bilaterally.  BACK:  No costovertebral angle tenderness.  CHEST:  Unremarkable.  HEART:  PMI not displaced or sustained.  S1 and S2 within normal limits.  No S3.  A 2/6 apical systolic murmur radiating slightly at the outflow  tract.  No diastolic murmurs.  ABDOMEN:  Flat, positive bowel sounds, normal in frequency and pitch.  No bruits, no rebound, no guarding.  No midline pulsatile mass.  No  hepatomegaly or splenomegaly.  SKIN:  No rashes.  No nodules.  EXTREMITIES:  2+ pulses throughout.  No edema.  No cyanosis.  No  clubbing.  NEUROLOGIC:  Oriented to person, place, and time.  Cranial nerves II  through XII grossly intact.  Motor grossly intact.   EKG, sinus rhythm, rate 76, axis within normal limits, intervals within  normal limits, inferior Q-waves, early transition lead V2.  No acute ST-  T wave  changes.   ASSESSMENT AND PLAN:  1. Presyncope.  The patient is having presyncope.  However, this seems      to correlate with premature ventricular contractions.  I first told      her that she needs to quit the 5 cups of caffeine that she is      having daily to see if these symptoms completely go away.  If not,      might choose a beta-blocker.  If she has any frank syncope she is      to let me know.  I will check a TSH today as well.  2. Coronary disease.  The patient is having some mild chest pain.  She      had a 60% left anterior descending stenosis in the past.  While      there is a risk of having her a false positive study, which she      apparently had with treadmill testing in the past, I do plan on      another exercise treadmill test to follow up the chest pain and the      nonobstructive coronary disease that she had in the past.  She      needs aggressive risk reduction.  3. Dyslipidemia per Dr. Christell Constant.  Her goal should be an LDL less than      100 and HDL greater than 40.  She remains on Lipitor.  4. Hypertension.  Blood pressure is controlled and she will continue      the meds as listed.  5. Followup.  I will see her at the time of her treadmill.     Rollene Rotunda, MD, [facility]  Electronically Signed    JH/MedQ  DD: 07/15/2008  DT: 07/16/2008  Job #: [id number]   cc:   Ernestina Penna, M.D.

## 2010-10-19 NOTE — Cardiovascular Report (Signed)
Amanda [name], Amanda [name] NO.:  [id number]   MEDICAL RECORD NO.:  [id number]          PATIENT TYPE:  OIB   LOCATION:  2899                         FACILITY:  [facility]   PHYSICIAN:  Salvadore Farber, MD  DATE OF BIRTH:  05-22-1931   DATE OF PROCEDURE:  09/29/2006  DATE OF DISCHARGE:                            CARDIAC CATHETERIZATION   PROCEDURE:  Pressure wire assessment of the LAD.   INDICATIONS:  This is a 77 year old woman with hypertension and  hypercholesterolemia.  A Cardiolite in 2003 for chest pain showed  abnormal EKG but normal perfusion.  She has had chest pain with  exertion.  She underwent diagnostic angiography by Dr. Gala Romney on  April 22.  That demonstrated at least moderate stenosis in mid LAD.  Some views appeared to be mild and some potentially more severe in a  napkin ring fashion.  She returns today for pressure wire interrogation  to assess the severity and thus hemodynamic significance.   PROCEDURAL TECHNIQUE:  Informed consent was obtained.  Underwent 1%  lidocaine local anesthesia, a 6-French sheath was placed in the right  common femoral artery using modified Seldinger technique.  Anticoagulation was initiated with bivalirudin.  ACT was confirmed to be  greater than 225 seconds.  A 6-French CLS 3.5 guide was advanced over  wire and engaged in the ostium of the left main.  The pressure wire was  advanced into the left main and pressure normalized.  It was then  advanced beyond the lesion.  Intracoronary adenosine was administered  (40 then 60 mcg).  This demonstrated an FFR of 0.91.  This was well  above the threshold for hemodynamic significance.  Procedure was  therefore completed.  Wire and catheter were removed.  The arteriotomy  was closed using Angio-Seal device.  The patient was transferred to  holding room in stable condition having tolerated the procedure well.   COMPLICATIONS:  None.   FINDINGS:  Moderate LAD stenosis with FFR of  0.91.   IMPRESSION/PLAN:  Will evaluate for other causes of chest discomfort.      Salvadore Farber, MD  Electronically Signed     WED/MEDQ  D:  09/29/2006  T:  09/29/2006  Job:  [id number]

## 2010-10-22 NOTE — Discharge Summary (Signed)
[place]  Patient:    Amanda [name], Amanda [name] Visit Number: [id number] MRN: [id number]          Service Type: MED Location: [phone] Attending Physician:  Doneta Public Dictated by:   Jonah Blue, M.D. Admit Date:  08/23/2001 Discharge Date: 08/24/2001   CC:         Montey Hora, M.D.  Dr. Thad Ranger, Neurology   Discharge Summary  DISCHARGE DIAGNOSES: 1. Presyncope/weakness. 2. History of left basal ganglia cerebrovascular accident. 3. Hypertension. 4. Diverticulosis. 5. Hypercholesterolemia.  DISCHARGE MEDICATIONS: 1. Lipitor 10 mg p.o. q.h.s. 2. Uniretic 7.5/12.5 mg p.o. q.d. 3. Plavix 75 mg p.o. q.d. 4. Aspirin 81 mg p.o. q.d.  FOLLOWUP:  The patient is to follow up with Dr. Montey Hora at [facility] on Tuesday, March 25, at 2 p.m.  Suggest consideration of echocardiogram versus outpatient stress test at that time. The patient also has cholesterol studies pending at [facility] that should be followed up at that time.  PROCEDURES/DIAGNOSTIC STUDIES:  Serial EKGs and cardiac telemetry show the patient in sinus rhythm throughout hospitalization.  CONSULTANTS:  None.  ADMISSION HISTORY AND PHYSICAL:  Please see dictated H&P for further information.  In short, this is a 77 year old white female with a history of a left basal ganglion CVA who presents with intermittent light-headedness, spots before her eyes, nausea, and bilateral arm weakness, associated with palpitations and feeling "stupid."  The patient was sent over for evaluation by Dr. Dimple Casey and for observation on telemetry to rule out arrhythmias as a cause of presyncope.  HOSPITAL COURSE: #1 - CARDIOVASCULAR:  The patient was on telemetry throughout hospitalization and had no evidence of arrhythmias such as atrial fibrillation.  She had cardiac enzymes that were negative and an EKG that shows a history of acute ongoing ischemia.   As the patient may be having some kind of cardiogenic focus for her current issues, would suggest an echocardiogram and/or stress test as an outpatient.  The patient does have 2/6 systolic murmur, unclear whether this has been present in the past.  An echocardiogram may help elucidate this, however, the patient does state that she did have an echocardiogram in January when she was worked up for her stroke, and no evidence of an echocardiogram was available on the chart.  An outpatient stress test may also be a useful indicator of ongoing ischemic difficulties.  #2 - NEUROLOGIC:  The patient was neurologically intact throughout her hospitalization and her symptoms resolved during her hospitalization.  The patient risk factors should be controlled with aggressive hypertension therapy.  She is currently stable on her Uniretic.  The patient should also have aggressive lipid therapy and is on Lipitor at this time.  The patient did have cholesterol laboratories drawn prior to admission at [facility] and this should be followed up as an outpatient. Additionally, Plavix 75 mg p.o. q.d. was added in conjunction with aspirin upon recommendation from doctors Reynolds and Hensel.  No further issues developed during this hospitalization.  DISCHARGE LABORATORIES:  Chemistries on August 23, 2001, show sodium 139, potassium 3.4, chloride 106, CO2 27, BUN 17, creatinine 1.1, glucose 102. Cardiac enzymes x3 were negative.  Calcium was 9.4.  TSH was 4.261.  The patient was discharged to home without further incident. Dictated by:   Jonah Blue, M.D. Attending Physician:  Doneta Public DD:  08/24/01 TD:  08/27/01 Job: [id number]

## 2010-10-22 NOTE — Op Note (Signed)
Amanda [name], Amanda [name]                    ACCOUNT NO.:  [id number]   MEDICAL RECORD NO.:  [id number]          PATIENT TYPE:  AMB   LOCATION:  ENDO                         FACILITY:  [facility]   PHYSICIAN:  John C. Madilyn Fireman, M.D.    DATE OF BIRTH:  Oct 03, 1930   DATE OF PROCEDURE:  06/21/2004  DATE OF DISCHARGE:                                 OPERATIVE REPORT   PROCEDURE PERFORMED:  Colonoscopy.   INDICATIONS FOR PROCEDURE:  History of adenomatous colon polyps, due for  surveillance.   PROCEDURE:  The patient was placed in the left lateral decubitus position  and placed on the pulse monitor with continuous low-flow oxygen delivered by  nasal cannula. She was sedated with 50 mcg IV fentanyl and 5 milligrams IV  Versed. The Olympus video colonoscope was inserted into the rectum and  advanced to cecum, confirmed by transillumination of McBurney's point and  visualization to the ileocecal valve and appendiceal orifice. Prep was  excellent. The cecum, ascending, transverse and descending colon all  appeared normal with no masses, polyps, diverticula or other mucosal  abnormalities.  Within the sigmoid colon was seen several scattered  diverticula. No other abnormalities. The rectum appeared normal. Retroflexed  view of the anus revealed no obvious internal hemorrhoids. The scope was  then withdrawn and the patient returned to the recovery room in stable  condition. She tolerated the procedure well. There were no immediate  complications.   IMPRESSION:  Diverticulosis. Otherwise normal study.   PLAN:  Repeat colonoscopy in five years.      JCH/MEDQ  D:  06/21/2004  T:  06/21/2004  Job:  [id number]   cc:   Ernestina Penna, M.D.  [address] [place]  [place] [address]  Fax: [phone]

## 2010-10-22 NOTE — Consult Note (Signed)
. [facility]  Patient:    Amanda [name], Amanda [name] Visit Number: [id number] MRN: [id number]          Service Type: EMS Location: [place] Attending Physician:  Ilene Qua Dictated by:   Kelli Hope, M.D. Proc. Date: 06/25/01 Admit Date:  06/25/2001   CC:         Juliene Pina, M.D.  Monica Becton, M.D.   Consultation Report  DATE OF BIRTH:  10-17-1930  REASON FOR CONSULTATION:  Right-sided numbness.  HISTORY OF PRESENT ILLNESS:  This is the initial emergency room consultation evaluation of this 77 year old woman with a past medical history including hypertension who reports that two weeks ago she had an acute spell in which her "face became numb."  There was no clear lateralization of the symptoms. It was associated with some slurring of speech.  This got better, but since then, intermittently, she has had "numbness" over the right upper extremity which has been a little bit variable and occurring on a daily basis.  This symptom became significantly worse this morning to the point that she had difx with fine motor functions such as dialing a telephone.  She presented to an outside emergency room and was subsequently transferred.  The patient reports that she still has symptoms, although, these are somewhat better.  She denies any history of previous similar symptoms.  PAST MEDICAL HISTORY:  As above.  She also has gastroesophageal reflux disease and has been on hormone replacement therapy for over 30 years.  FAMILY HISTORY:  She thinks that her mother had a stroke because of difficulty using her arms late in life.  SOCIAL HISTORY:  She has not used tobacco.  She lives with her husband.  ALLERGIES:  ASPIRIN, although this actually gives her GI upset.  PENICILLIN.  MEDICATIONS: 1. "Stomach pill." 2. "Fluid pill." 3. "Hormone."  REVIEW OF SYSTEMS:  No headache, chest pain, shortness of breath, nausea and vomiting, or  diarrhea.  She does have occasional heartburn symptoms.  PHYSICAL EXAMINATION:  VITAL SIGNS:  Temperature 99.2, blood pressure 155/81, pulse 76, respirations 18, O2 sat 95% on room air.  GENERAL:  She is alert and oriented and in no acute distress.  Speech is fluent and not dysarthric.  Mood is euthymic and affect appropriate.  NECK:  Supple without carotid bruit.  HEART:  Regular rate and rhythm without murmurs.  NEUROLOGICAL:  Cranial nerves; funduscopic examination was benign.  Pupils are equal and briskly reactive.  Extraocular movements are normal without nystagmus. Face, tongue, and palate move normally and symmetrically. There is decreased pinprick sensation over the right side of the face compared to the left.  Motor examination; normal bulk and tone.  Normal strength in all tested extremity muscles.  Sensation; she reports a decreased pinprick sensation over the right upper and lower extremities versus the left.  Rapid alternating movements are a bit slow on the right.  Finger-to-nose and heel-to-shin are performed well.  Gait is normal and she is able to perform Romberg maneuver.  LABORATORY DATA:  BMET is normal.  CT of the head is grossly reviewed and demonstrates a couple of old strokes in the left basal ganglia.  Carotid Doppler performed in the emergency room is negative.  MRI performed in the emergency room reveals a small acute cortical infarct in the left frontoparietal area.  MRA reveals distal atherosclerotic disease in the superior branch of the left middle cerebral artery.  IMPRESSION: 1. Small brain left cortical  infarct, suspect an artery to artery embolus due    to distal atherosclerotic disease.  No evidence of cardiac or carotid    source. 2. Hypertension, fair control.  RECOMMENDATIONS: 1. Enteric-coated aspirin q.d.  I recommended a baby aspirin.  If she cannot    tolerate baby aspirin, she could take Plavix. 2. I recommend that she follow up  with her primary physician regarding the    following issues:    a. Blood pressure control with a goal of 130/85.    b. The need for hormone replacement therapy.    c. Cholesterol check. 3. She was advised to call [facility] in three to four    weeks for follow-up, although, this will not be necessary if she is able    to follow up with her primary physician about the above. Dictated by:   Kelli Hope, M.D. Attending Physician:  Ilene Qua DD:  06/25/01 TD:  06/26/01 Job: [id number]

## 2010-10-22 NOTE — H&P (Signed)
[facility]  Patient:    Amanda [name], Amanda [name] Visit Number: [id number] MRN: [id number]          Service Type: MED Location: [phone] Attending Physician:  Doneta Public Dictated by:   Mont Dutton, M.D. Admit Date:  08/23/2001 Discharge Date: 08/24/2001   CC:         [facility]   History and Physical  PRIMARY PHYSICIAN:  [facility].  CHIEF COMPLAINT:  Presyncope and weakness.  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  She presents with intermitted lightheadedness, spots before her eyes, nausea, and bilateral arm weakness.  This was associated with palpitations and "feeling stupid." These events have been witnessed by spouse and he concurs.  These episodes last seconds to minutes and occur daily.  REVIEW OF SYSTEMS:  Negative for URI symptoms.  Negative for GI bleed. Negative UTI symptoms.  Negative for chest pain, shortness of breath, or PND. Negative for heat or cold intolerance.  Negative seizures or headaches.  PAST MEDICAL HISTORY: 1. Left basal ganglion infarct on June 25, 2001.  At that time a CT,    noncontrast, on June 25, 2001, and MRI on June 25, 2001, showed    chronic small infarcts in the left parietal region.  MRA in June 25, 2001, showed occluded left MCA branch with stenosis.  Carotid Dopplers    negative for stenosis.  A neurologic consult with Kelli Hope, M.D. 2. Hypertension. 3. Diverticulosis. 4. Hypercholesterolemia. 5. No CAD history.  MEDICATIONS: 1. Lipitor 10 mg p.o. q.d. 2. Uniretic 7.5/12.5 mg q.d. 3. Aspirin 81 mg p.o. q.d.  ALLERGIES:  PENICILLIN causes hives.  PAST SURGICAL HISTORY:  History of appendectomy, cholecystectomy, hysterectomy, and tonsillectomy.  No prior cardiac surgery.  SOCIAL HISTORY:  The patient quit  smoking 20 years ago, but at that time had only been smoking one pack per month.  No alcohol.  No drugs.  She is a retired Engineer, manufacturing systems.  [place].  FAMILY HISTORY:  Diabetes, MIs, hypertension, stroke, and CAD.  PHYSICAL EXAMINATION:  Blood pressure 140/79, heart rate 107, respiratory rate 16.  GENERAL APPEARANCE:  Alert and oriented x 3.  In no apparent distress. Pleasant and appropriate.  HEENT:  TMs pearly gray bilaterally.  Nares pink.  Oropharynx without lesions.  NECK:  No thyromegaly.  No bruits.  No JVD.  LUNGS:  Clear to auscultation bilaterally.  CARDIOVASCULAR:  Regular.  ABDOMEN:  Soft and nontender.  Positive bowel sounds.  Negative for hepatosplenomegaly.  EXTREMITIES:  No edema.  Good pulses.  NEUROLOGIC:  CN II-XII intact.  Motor 5/5 in all muscle groups.  Sensory is intact.  Cerebellar function intact.  Gait intact.  LABORATORY DATA:  EKG:  Flipped T in V1 and aVL, normal sinus rhythm.  No labs.  ASSESSMENT AND PLAN:  A 77 year old female with known cerebrovascular disease with ongoing symptoms of presyncope and weakness.  EKG with T-wave inversion. No old EKG to compare for now.  Will put her in the hospital for 23-hour observation on telemetry to rule out arrhythmias as the cause of presyncope. EKG/enzymes to rule out acute ongoing ischemia or atrial fibrillation.  Check TSH.  Fasting lipid panel drawn at [facility] today. CVA risk factory control, including aggressive blood pressure decreased to a  goal of less than 135/75.  Aggressive lipid lowering goal of LDL less than 100.  Will add Plavix 75 mg p.o. q.d.  I spoke with Kelli Hope, M.D., and Santiago Bumpers. Hensel, M.D., who concur with plan. Dictated by:   Mont Dutton, M.D. Attending Physician:  Doneta Public DD:  08/23/01 TD:  08/24/01 Job: [id number]

## 2010-10-22 NOTE — Assessment & Plan Note (Signed)
[facility]                            CARDIOLOGY OFFICE NOTE   NAME:Amanda [name], Amanda [name]                           MRN:          [id number]  DATE:09/25/2006                            DOB:          Jul 05, 1930    REASON FOR CONSULTATION:  Chest pain and near-syncope.   HISTORY OF PRESENT ILLNESS:  Amanda [name] is a pleasant 77 year old woman  with a longstanding history of hypertension as well as previously-  documented transient ischemic attacks and hyperlipidemia.  She has no  clearly documented coronary artery disease based on prior testing which  included an exercise Cardiolite in [place] 2003 which did demonstrate  abnormal electrocardiographic changes, although normal perfusion imaging  and an ejection fraction greater than 70%.  She was followed by Dr.  Antoine Poche back in 2003 although has not seen him regularly since that  time.   Symptomatically, Amanda [name] describes a progressive 7-year history of  exertional chest pain.  More recently, over the last several months, she  has had an increase in frequency and intensity of these symptoms,  describing a dull discomfort in her chest associated with sweating,  shortness of breath, and a feeling as if she is nearly going to pass  out, although she has not had any frank syncope by her description.  These episodes occur with exertion, specifically walking up inclines and  steps, and she has had no rest symptoms whatsoever.  She denies any  major sense of palpitations associated with this.  She saw Dr. Christell Constant  recently and was referred to [place] for further discussion.   Copy of the recent electrocardiogram from April 18 showed ST changes  with high lateral ST segment depression and ST elevation of just under 1  mm in lead V1 with a Q wave present.  She also had nonspecific ST  changes in leads V1 through V3.  On today's tracing, these changes have  improved.  She continues with T-wave inversions in the high lateral  leads and is in sinus bradycardia with heart rates in the 50s.  She has  had no interval ischemic evaluation.   ALLERGIES:  No known drug allergies.  No reported contrast allergy or  iodine allergy.   PRESENT MEDICATIONS:  1. Hydrochlorothiazide 12.5 mg p.o. daily.  2. Plavix 75 mg p.o. daily.  3. Lipitor 10 mg p.o. daily.  4. Metoprolol 50 mg p.o. b.i.d.  5. Calcium supplements.  6. Omega 3 fish oil supplements.  7. Aspirin 81 mg p.o. b.i.d.  8. Altace 5 mg p.o. daily.   She was also recently given a prescription for sublingual nitroglycerin  and albuterol metered dose inhaler.   PAST MEDICAL HISTORY:  Is as outlined above.  Review of the chart  indicates prior irritable bowel syndrome and gastroesophageal reflux  disease.  No major surgeries reported.   SOCIAL HISTORY:  The patient is a widow.  She is retired from [employer].  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  She denies any tobacco or alcohol use history.   FAMILY  HISTORY:  Noncontributory for premature cardiovascular disease.  She has a brother with heart disease in his late 2s and sister who died  of a heart attack in her late 94s.   REVIEW OF SYSTEMS:  As described in the history of present illness.  She  denies any frank cough, hemoptysis, wheezing, palpitations, dizziness.  She has had some mild lower extremity edema.  No obvious claudication.  No other bleeding diathesis.  Otherwise, systems negative.   EXAMINATION:  VITAL SIGNS:  Blood pressure is 134/72, heart rate is 48,  weight is 152 pounds.  GENERAL:  The patient is comfortable, normally nourished, in no acute  distress, without active symptoms.  HEENT:  Conjunctivae and lids normal, oropharynx clear.  NECK:  Supple.  No elevated jugular venous pressure or loud bruits.  No  thyromegaly is noted.  LUNGS:  Somewhat diminished breath sounds, no labored breathing or  wheezes noted, no rhonchi.  CARDIAC:   Reveals a regular rate and rhythm.  Soft systolic murmur heard  at the base.  Preserved S2.  No S3 gallop or pericardial rub.  ABDOMEN:  Soft, nontender, normal active bowel sounds.  No obvious  hepatomegaly or bruits.  EXTREMITIES:  Exhibit no significant pitting edema.  Distal pulses are  2+.  SKIN:  Warm and dry.  No ulcerative changes noted.  MUSCULOSKELETAL:  No kyphosis is noted.  NEUROPSYCHIATRIC:  The patient is alert and oriented x3.  Affect is  normal.   IMPRESSION AND RECOMMENDATIONS:  1. Symptoms concerning for progressive exertional angina in a 75-year-      old woman with hypertension, hyperlipidemia, and previous transient      ischemic attack.  Previous noninvasive testing in 2003 demonstrated      no major perfusion defects with normal ejection fraction, although      with abnormal electrocardiographic changes.  I suppose it is      possible she had balanced ischemia at that time.  She does manifest      some dynamic ST-T wave changes based on recent electrocardiograms.      We had a discussion of the issues today and I have recommended a      diagnostic cardiac catheterization to clearly outline the coronary      anatomy and assess for any revascularization options.  We have      reviewed the potential risks and benefits of this and she is in      agreement to proceed.  She reports having a recent chest x-ray with      Dr. Christell Constant and we will try to obtain this report.  Baseline blood      work otherwise will be obtained and we will schedule this procedure      as soon as possible.  I      have encouraged her to continue her present regimen for the time      being.  2. Further plans to follow.     Jonelle Sidle, MD  Electronically Signed    SGM/MedQ  DD: 09/25/2006  DT: 09/25/2006  Job #: [id number]   cc:   Ernestina Penna, M.D.

## 2010-10-22 NOTE — Cardiovascular Report (Signed)
Amanda [name], LIEURANCE NO.:  [id number]   MEDICAL RECORD NO.:  [id number]          PATIENT TYPE:  OIB   LOCATION:  1962                         FACILITY:  [facility]   PHYSICIAN:  Bevelyn Buckles. Bensimhon, MDDATE OF BIRTH:  02-Oct-1930   DATE OF PROCEDURE:  09/26/2006  DATE OF DISCHARGE:                            CARDIAC CATHETERIZATION   PRIMARY CARE PHYSICIAN:  Ernestina Penna, M.D.   CARDIOLOGIST:  Jonelle Sidle, M.D.   INDICATIONS:  Ms. Maes is a very pleasant 77 year old woman with a  history of hypertension and hyperlipidemia.  She has a history as well  of TIAs.  She had a Cardiolite in [place] 2003 for chest pain which showed  abnormal EKG, but normal perfusion and an EF of 70%.  She saw Dr.  Diona Browner yesterday with a 7-year history of exertional chest pain which  has become progressive.  This was quite concerning for angina.  She also  had evidence of ST depression in the high lateral wall which was new.  She was referred for catheterization in our outpatient cardiac lab.  She  has not had nocturnal symptoms.   PROCEDURES PERFORMED:  1. Selective coronary angiography.  2. Left heart cath.  3. Left ventriculogram.   DESCRIPTION OF PROCEDURE:  The risks and benefits of catheterization  were explained, consent was signed and placed on the chart.  A 4-French  arterial sheath was placed in the right femoral artery and standard  catheters including a JL-4, 3-DRC and angled pigtail were used for  procedure.  All catheter exchanges were made over wire.  There were no  apparent complications.  Total of 95 mL of contrast was used for the  procedure.   Central aortic pressure 99/49 with a mean of 70.  LV pressure 120/4 with  an EDP of 11.  There was no aortic stenosis.   Left main was normal.   The LAD was a long vessel coursing to the apex.  It had a 30% tubular  lesion ostially.  In the mid portion of the LAD, there was a focal  calcified lesion and appeared to  be at least 60% stenotic, but perhaps  much worse.   Left circumflex gave off a small OM-1, a moderate-sized branching OM-2  and moderate-to-large OM-3.  There are 30% tandem lesions in the  proximal circumflex and some mild irregularities more distally leading  into the OM-3.   Right coronary artery is a moderate-sized vessel, gave off a moderate-to-  large PDA and 2 small posterolaterals.  There was some minor  irregularities proximally in the RCA and a 40% lesion in the midsection.   Left ventricle was small, was hyperdynamic with an EF of 75-80% with  cavity obliteration at end-systole.  There was significant mitral  regurgitation.  With her ectopic beats, it was difficult to gauge her  mitral regurgitation, otherwise.   ASSESSMENT:  1. Probable significant 1-vessel coronary artery disease with      calcified napkin ring lesion in the mid-left anterior descending.  2. Mild nonobstructive coronary artery disease elsewhere.  3. Hyperdynamic left ventricle.   PLAN/DISCUSSION:  I have reviewed the films with Dr. Samule Ohm and Dr.  Diona Browner.  We all suspect that this LAD lesion is probably high-grade.  We will load her with Plavix and she will be brought back on Friday for  IVUS versus pressure wire interrogation of the LAD with possible  percutaneous angioplasty and stenting.      Bevelyn Buckles. Bensimhon, MD  Electronically Signed     DRB/MEDQ  D:  09/26/2006  T:  09/26/2006  Job:  [id number]   cc:   Ernestina Penna, M.D.  Jonelle Sidle, MD

## 2011-01-11 ENCOUNTER — Encounter: Payer: Self-pay | Admitting: Cardiology

## 2011-03-18 LAB — DIFFERENTIAL
Basophils Absolute: 0
Basophils Relative: 1
Eosinophils Absolute: 0.2
Eosinophils Relative: 2
Lymphocytes Relative: 32
Lymphs Abs: 2.2
Monocytes Absolute: 0.7
Monocytes Relative: 9
Neutro Abs: 3.9
Neutrophils Relative %: 56

## 2011-03-18 LAB — COMPREHENSIVE METABOLIC PANEL
Alkaline Phosphatase: 65
BUN: 21
CO2: 27
Chloride: 103
Creatinine, Ser: 1.14
GFR calc non Af Amer: 47 — ABNORMAL LOW
Glucose, Bld: 102 — ABNORMAL HIGH
Potassium: 3.9
Total Bilirubin: 0.7

## 2011-03-18 LAB — CARDIAC PANEL(CRET KIN+CKTOT+MB+TROPI)
CK, MB: 1.7
Relative Index: INVALID
Total CK: 90
Troponin I: 0.02
Troponin I: 0.02

## 2011-03-18 LAB — LIPID PANEL
LDL Cholesterol: 38
Triglycerides: 172 — ABNORMAL HIGH

## 2011-03-18 LAB — CBC
HCT: 38.3
Hemoglobin: 13.3
MCHC: 34.7
MCV: 93.6
Platelets: 247
RBC: 4.09
RDW: 12.5
WBC: 7

## 2011-03-18 LAB — POCT CARDIAC MARKERS
CKMB, poc: 1.5
Troponin i, poc: <0.05

## 2011-03-18 LAB — APTT: aPTT: 28

## 2011-03-18 LAB — BASIC METABOLIC PANEL
Calcium: 10.3
Creatinine, Ser: 1.05
GFR calc Af Amer: >60
GFR calc non Af Amer: 51 — ABNORMAL LOW
Sodium: 142

## 2011-03-18 LAB — CK TOTAL AND CKMB (NOT AT ARMC)
CK, MB: 2.2
Relative Index: 1.9
Total CK: 114

## 2011-03-18 LAB — BASIC METABOLIC PANEL WITH GFR
BUN: 18
CO2: 28
Chloride: 104
Glucose, Bld: 97
Potassium: 3.6

## 2011-03-18 LAB — PROTIME-INR
INR: 0.9
Prothrombin Time: 12.7

## 2011-03-18 LAB — TROPONIN I: Troponin I: 0.02

## 2011-03-18 LAB — TSH: TSH: 2.227

## 2012-05-19 ENCOUNTER — Encounter: Payer: Self-pay | Admitting: Family Medicine

## 2013-04-09 ENCOUNTER — Inpatient Hospital Stay (HOSPITAL_COMMUNITY): Payer: Medicare Other

## 2013-04-09 ENCOUNTER — Observation Stay (HOSPITAL_COMMUNITY)
Admission: EM | Admit: 2013-04-09 | Discharge: 2013-04-11 | Disposition: A | Discharge status: Home / Self Care | Payer: Medicare Other | Attending: Internal Medicine | Admitting: Internal Medicine

## 2013-04-09 ENCOUNTER — Emergency Department (HOSPITAL_COMMUNITY): Payer: Medicare Other

## 2013-04-09 DIAGNOSIS — I10 Essential (primary) hypertension: Secondary | ICD-10-CM

## 2013-04-09 DIAGNOSIS — K5730 Diverticulosis of large intestine without perforation or abscess without bleeding: Secondary | ICD-10-CM

## 2013-04-09 DIAGNOSIS — G459 Transient cerebral ischemic attack, unspecified: Secondary | ICD-10-CM

## 2013-04-09 DIAGNOSIS — Z8673 Personal history of transient ischemic attack (TIA), and cerebral infarction without residual deficits: Secondary | ICD-10-CM | POA: Insufficient documentation

## 2013-04-09 DIAGNOSIS — I079 Rheumatic tricuspid valve disease, unspecified: Secondary | ICD-10-CM | POA: Insufficient documentation

## 2013-04-09 DIAGNOSIS — E785 Hyperlipidemia, unspecified: Secondary | ICD-10-CM | POA: Insufficient documentation

## 2013-04-09 DIAGNOSIS — R4701 Aphasia: Secondary | ICD-10-CM | POA: Insufficient documentation

## 2013-04-09 DIAGNOSIS — J3489 Other specified disorders of nose and nasal sinuses: Secondary | ICD-10-CM | POA: Insufficient documentation

## 2013-04-09 DIAGNOSIS — K219 Gastro-esophageal reflux disease without esophagitis: Secondary | ICD-10-CM | POA: Insufficient documentation

## 2013-04-09 DIAGNOSIS — I379 Nonrheumatic pulmonary valve disorder, unspecified: Secondary | ICD-10-CM | POA: Insufficient documentation

## 2013-04-09 DIAGNOSIS — I2510 Atherosclerotic heart disease of native coronary artery without angina pectoris: Secondary | ICD-10-CM

## 2013-04-09 DIAGNOSIS — I658 Occlusion and stenosis of other precerebral arteries: Principal | ICD-10-CM | POA: Insufficient documentation

## 2013-04-09 DIAGNOSIS — I6350 Cerebral infarction due to unspecified occlusion or stenosis of unspecified cerebral artery: Secondary | ICD-10-CM

## 2013-04-09 DIAGNOSIS — I639 Cerebral infarction, unspecified: Secondary | ICD-10-CM

## 2013-04-09 DIAGNOSIS — R4789 Other speech disturbances: Secondary | ICD-10-CM | POA: Insufficient documentation

## 2013-04-09 DIAGNOSIS — R4182 Altered mental status, unspecified: Secondary | ICD-10-CM

## 2013-04-09 LAB — COMPREHENSIVE METABOLIC PANEL
ALT: 13 U/L (ref 0–35)
Albumin: 3.9 g/dL (ref 3.5–5.2)
Alkaline Phosphatase: 84 U/L (ref 39–117)
BUN: 16 mg/dL (ref 6–23)
Chloride: 105 mEq/L (ref 96–112)
Potassium: 3.9 mEq/L (ref 3.5–5.1)
Sodium: 138 mEq/L (ref 135–145)
Total Bilirubin: 0.3 mg/dL (ref 0.3–1.2)
Total Protein: 7.2 g/dL (ref 6.0–8.3)

## 2013-04-09 LAB — CBC
Hemoglobin: 13.9 g/dL (ref 12.0–15.0)
MCHC: 35.4 g/dL (ref 30.0–36.0)
Platelets: 216 10*3/uL (ref 150–400)
RBC: 4.21 MIL/uL (ref 3.87–5.11)

## 2013-04-09 LAB — APTT: aPTT: 25 seconds (ref 24–37)

## 2013-04-09 LAB — POCT I-STAT, CHEM 8
Chloride: 108 mEq/L (ref 96–112)
HCT: 39 % (ref 36.0–46.0)
Hemoglobin: 13.3 g/dL (ref 12.0–15.0)
Potassium: 3.7 mEq/L (ref 3.5–5.1)
Sodium: 141 mEq/L (ref 135–145)
TCO2: 22 mmol/L (ref 0–100)

## 2013-04-09 LAB — PROTIME-INR
INR: 1 (ref 0.00–1.49)
Prothrombin Time: 13 seconds (ref 11.6–15.2)

## 2013-04-09 LAB — POCT I-STAT TROPONIN I: Troponin i, poc: 0.01 ng/mL (ref 0.00–0.08)

## 2013-04-09 LAB — DIFFERENTIAL
Basophils Relative: 1 % (ref 0–1)
Eosinophils Absolute: 0.2 10*3/uL (ref 0.0–0.7)
Lymphs Abs: 3 10*3/uL (ref 0.7–4.0)
Monocytes Relative: 9 % (ref 3–12)
Neutro Abs: 4 10*3/uL (ref 1.7–7.7)
Neutrophils Relative %: 51 % (ref 43–77)

## 2013-04-09 LAB — TROPONIN I: Troponin I: <0.3 ng/mL (ref <0.30)

## 2013-04-09 LAB — GLUCOSE, CAPILLARY: Glucose-Capillary: 86 mg/dL (ref 70–99)

## 2013-04-09 MED ORDER — ACETAMINOPHEN 650 MG RE SUPP
650.0000 mg | RECTAL | Status: DC | PRN
  Administered 2013-04-09 – 2013-04-10 (×2): 650 mg via RECTAL
  Filled 2013-04-09 (×2): qty 1

## 2013-04-09 MED ORDER — ACETAMINOPHEN 325 MG PO TABS: 650.0000 mg | ORAL_TABLET | ORAL | Status: DC | PRN

## 2013-04-09 MED ORDER — ENOXAPARIN SODIUM 40 MG/0.4ML ~~LOC~~ SOLN
40.0000 mg | SUBCUTANEOUS | Status: DC
  Administered 2013-04-09 – 2013-04-10 (×2): 40 mg via SUBCUTANEOUS
  Filled 2013-04-09 (×3): qty 0.4

## 2013-04-09 MED ORDER — ONDANSETRON HCL 4 MG/2ML IJ SOLN
INTRAMUSCULAR | Status: AC
  Administered 2013-04-09: 4 mg
  Filled 2013-04-09: qty 2

## 2013-04-09 MED ORDER — PANTOPRAZOLE SODIUM 40 MG PO TBEC
40.0000 mg | DELAYED_RELEASE_TABLET | Freq: Every day | ORAL | Status: DC
  Administered 2013-04-10 – 2013-04-11 (×2): 40 mg via ORAL
  Filled 2013-04-09 (×2): qty 1

## 2013-04-09 MED ORDER — ONDANSETRON HCL 4 MG/2ML IJ SOLN: 4.0000 mg | Freq: Three times a day (TID) | INTRAMUSCULAR | Status: AC | PRN

## 2013-04-09 MED ORDER — SODIUM CHLORIDE 0.9 % IV SOLN
Freq: Once | INTRAVENOUS | Status: AC
  Administered 2013-04-09: 18:00:00 via INTRAVENOUS

## 2013-04-09 MED ORDER — ONDANSETRON HCL 4 MG/2ML IJ SOLN
4.0000 mg | Freq: Once | INTRAMUSCULAR | Status: AC
  Filled 2013-04-09: qty 2

## 2013-04-09 MED ORDER — SENNOSIDES-DOCUSATE SODIUM 8.6-50 MG PO TABS: 1.0000 | ORAL_TABLET | Freq: Every evening | ORAL | Status: DC | PRN

## 2013-04-09 MED ORDER — ONDANSETRON HCL 4 MG/2ML IJ SOLN
4.0000 mg | Freq: Once | INTRAMUSCULAR | Status: AC
  Administered 2013-04-09: 4 mg via INTRAVENOUS
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
## 2013-04-09 NOTE — ED Notes (Signed)
Code stroke called at 1530; pt arrived at 1556; LNK at 1200; EDP exam 1558; Stroke Team arrival 8; neurologist arrival 1554; pt arrival in CT 1600; phlebotomist arrival 9; CT read at 51.
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
## 2013-04-10 ENCOUNTER — Encounter (HOSPITAL_COMMUNITY): Payer: Self-pay | Admitting: Radiology

## 2013-04-10 ENCOUNTER — Observation Stay (HOSPITAL_COMMUNITY): Payer: Medicare Other

## 2013-04-10 ENCOUNTER — Inpatient Hospital Stay (HOSPITAL_COMMUNITY): Payer: Medicare Other

## 2013-04-10 DIAGNOSIS — E785 Hyperlipidemia, unspecified: Secondary | ICD-10-CM

## 2013-04-10 DIAGNOSIS — I2510 Atherosclerotic heart disease of native coronary artery without angina pectoris: Secondary | ICD-10-CM

## 2013-04-10 DIAGNOSIS — I369 Nonrheumatic tricuspid valve disorder, unspecified: Secondary | ICD-10-CM

## 2013-04-10 DIAGNOSIS — G459 Transient cerebral ischemic attack, unspecified: Secondary | ICD-10-CM

## 2013-04-10 LAB — URINALYSIS, ROUTINE W REFLEX MICROSCOPIC
Glucose, UA: NEGATIVE mg/dL
Ketones, ur: NEGATIVE mg/dL
Protein, ur: NEGATIVE mg/dL
Urobilinogen, UA: 1 mg/dL (ref 0.0–1.0)

## 2013-04-10 LAB — URINE MICROSCOPIC-ADD ON

## 2013-04-10 LAB — LIPID PANEL
HDL: 45 mg/dL (ref >39)
LDL Cholesterol: 108 mg/dL — ABNORMAL HIGH (ref 0–99)
VLDL: 30 mg/dL (ref 0–40)

## 2013-04-10 LAB — RAPID URINE DRUG SCREEN, HOSP PERFORMED
Amphetamines: NOT DETECTED
Opiates: NOT DETECTED

## 2013-04-10 LAB — HEMOGLOBIN A1C: Hgb A1c MFr Bld: 5.6 % (ref <5.7)

## 2013-04-10 MED ORDER — IOHEXOL 350 MG/ML SOLN
50.0000 mL | Freq: Once | INTRAVENOUS | Status: AC | PRN
  Administered 2013-04-10: 50 mL via INTRAVENOUS

## 2013-04-10 MED ORDER — ASPIRIN EC 325 MG PO TBEC
325.0000 mg | DELAYED_RELEASE_TABLET | Freq: Every day | ORAL | Status: DC
  Administered 2013-04-10 – 2013-04-11 (×2): 325 mg via ORAL
  Filled 2013-04-10 (×2): qty 1

## 2013-04-10 NOTE — Progress Notes (Signed)
  Echocardiogram 2D Echocardiogram has been performed.  Amanda [name] 04/10/2013, 10:04 AM
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
## 2013-04-10 NOTE — Progress Notes (Signed)
VASCULAR LAB PRELIMINARY  PRELIMINARY  PRELIMINARY  PRELIMINARY  Carotid duplex  completed.    Preliminary report:  Bilateral:  1-39% ICA stenosis.  Vertebral artery flow is antegrade.      Jerre Vandrunen, RVT 04/10/2013, 1:08 PM

## 2013-04-10 NOTE — Progress Notes (Signed)
OT Cancellation Note  Patient Details Name: Jennife T Schlechter MRN: [id number] DOB: 11/11/1930   Cancelled Treatment:    Reason Eval/Treat Not Completed: OT screened, no needs identified, will sign off  Spoke with PT and pt has no OT needs at this time.   Earlie Raveling OTR/L [phone] 04/10/2013, 3:16 PM

## 2013-04-10 NOTE — Progress Notes (Signed)
Routine EEG completed.  

## 2013-04-10 NOTE — Progress Notes (Signed)
Patient arrived back to room, alert, verbalized thst eh si hungry with NPO order. MD of to change it to cardiac diet. Will continue to monitor,   Sim Boast, RN 04/10/13 1025
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
## 2013-04-10 NOTE — Progress Notes (Signed)
UR COMPLETED  

## 2013-04-11 DIAGNOSIS — K219 Gastro-esophageal reflux disease without esophagitis: Secondary | ICD-10-CM

## 2013-04-11 DIAGNOSIS — K5730 Diverticulosis of large intestine without perforation or abscess without bleeding: Secondary | ICD-10-CM

## 2013-04-11 MED ORDER — ASPIRIN 325 MG PO TBEC: 325.0000 mg | DELAYED_RELEASE_TABLET | Freq: Every day | ORAL | Status: DC

## 2013-04-11 NOTE — Procedures (Signed)
EEG report.  Brief clinical history:  79 years old female admitted to [facility] with acute delirium. Technique: this is a 17 channel routine scalp EEG performed at the bedside with bipolar and monopolar montages arranged in accordance to the international 10/20 system of electrode placement. One channel was dedicated to EKG recording.  The study was performed during wakefulness, drowsiness, and stage 2 sleep. No activating procedures performed.  Description:In the wakeful state, the best background consisted of a medium amplitude, posterior dominant, well sustained, symmetric and reactive 10 Hz rhyth Drowsiness demonstrated dropout of the alpha rhythm. Stage 2 sleep showed symmetric and synchronous sleep spindles without intermixed epileptiform discharges. No focal or generalized epileptiform discharges noted.  No pathologic areas of slowing seen.  EKG showed sinus rhythm.  Impression: this is a normal awake and asleep EEG. Please, be aware that a normal EEG does not exclude the possibility of epilepsy.  Clinical correlation is advised.  Wyatt Portela, MD
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
## 2013-04-11 NOTE — Progress Notes (Signed)
UR complete.  Briaunna Grindstaff RN, MSN 

## 2013-04-11 NOTE — Plan of Care (Signed)
D/c instructions given to pt. Pt is stable to be d/c. Med script given to pt. Pt is stable to be d/c. Tele and iv removed.
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
## 2013-07-23 ENCOUNTER — Ambulatory Visit: Payer: Self-pay | Admitting: Nurse Practitioner

## 2013-08-07 ENCOUNTER — Ambulatory Visit: Payer: Self-pay | Admitting: Nurse Practitioner

## 2014-11-25 ENCOUNTER — Encounter (INDEPENDENT_AMBULATORY_CARE_PROVIDER_SITE_OTHER): Payer: Medicare HMO | Admitting: Ophthalmology

## 2014-11-25 DIAGNOSIS — H35371 Puckering of macula, right eye: Secondary | ICD-10-CM | POA: Diagnosis not present

## 2014-11-25 DIAGNOSIS — H43813 Vitreous degeneration, bilateral: Secondary | ICD-10-CM | POA: Diagnosis not present

## 2014-11-25 DIAGNOSIS — H35033 Hypertensive retinopathy, bilateral: Secondary | ICD-10-CM | POA: Diagnosis not present

## 2014-11-25 DIAGNOSIS — H3531 Nonexudative age-related macular degeneration: Secondary | ICD-10-CM

## 2014-11-25 DIAGNOSIS — I10 Essential (primary) hypertension: Secondary | ICD-10-CM

## 2015-02-25 ENCOUNTER — Encounter (INDEPENDENT_AMBULATORY_CARE_PROVIDER_SITE_OTHER): Payer: Medicare HMO | Admitting: Ophthalmology

## 2015-02-25 DIAGNOSIS — H35033 Hypertensive retinopathy, bilateral: Secondary | ICD-10-CM | POA: Diagnosis not present

## 2015-02-25 DIAGNOSIS — H43813 Vitreous degeneration, bilateral: Secondary | ICD-10-CM

## 2015-02-25 DIAGNOSIS — H3562 Retinal hemorrhage, left eye: Secondary | ICD-10-CM | POA: Diagnosis not present

## 2015-02-25 DIAGNOSIS — I10 Essential (primary) hypertension: Secondary | ICD-10-CM

## 2015-02-25 DIAGNOSIS — H3531 Nonexudative age-related macular degeneration: Secondary | ICD-10-CM

## 2016-12-20 ENCOUNTER — Ambulatory Visit (INDEPENDENT_AMBULATORY_CARE_PROVIDER_SITE_OTHER): Payer: Medicare HMO | Admitting: Family Medicine

## 2016-12-20 ENCOUNTER — Encounter: Payer: Self-pay | Admitting: Family Medicine

## 2016-12-20 VITALS — BP 157/78 | HR 74 | Temp 97.0°F | Ht 59.0 in | Wt 141.0 lb

## 2016-12-20 DIAGNOSIS — I10 Essential (primary) hypertension: Secondary | ICD-10-CM | POA: Diagnosis not present

## 2016-12-20 DIAGNOSIS — Z8673 Personal history of transient ischemic attack (TIA), and cerebral infarction without residual deficits: Secondary | ICD-10-CM

## 2016-12-20 DIAGNOSIS — E785 Hyperlipidemia, unspecified: Secondary | ICD-10-CM

## 2016-12-20 NOTE — Progress Notes (Signed)
   HPI  Patient presents today to establish care.  Patient states that her previous PCP she has to find a new doctor.  She has history of CVA, she's tolerating Lipitor easily. She takes aspirin daily. She is active, she feels physically that she can do everything she like to do. She does bruise easily.  No chest pain, headache, leg edema. Medication compliance overall  PMH: CAD nonobstructive, GERD, diverticulosis, hyperlipidemia, CVA, TIA Surgical history: Appendectomy, cholecystectomy, tonsillectomy, hysterectomy Family history Brother and sister with heart attack Social history: Volunteers 2 days a week at [employer], former smoker, no alcohol or drug use. ROS: Per HPI  Objective: BP (!) 157/78   Pulse 74   Temp (!) 97 F (36.1 C) (Oral)   Ht '4\' 11"'$  (1.499 m)   Wt 141 lb (64 kg)   BMI 28.48 kg/m  Gen: NAD, alert, cooperative with exam HEENT: NCAT, EOMI, PERRL, TMs normal bilaterally, oropharynx moist and clear CV: RRR, good S1/S2, no murmur Resp: CTABL, no wheezes, non-labored Abd: SNTND, BS present, no guarding or organomegaly Ext: No edema, warm Neuro: Alert and oriented, No gross deficits  Assessment and plan:  # History of CVA, hyperlipidemia Checking labs today, LDL goal less than 70 Continue Lipitor Tolerating well, no residual symptoms of CVA  # Hypertension Elevated today, first blood pressure reading was better. Patient has evidence of orthostasis already, I don't believe that she could tolerate higher doses of losartan.     Orders Placed This Encounter  Procedures  . CMP14+EGFR  . CBC with Differential/Platelet  . LDL Cholesterol, Direct    Meds ordered this encounter  Medications  . oxybutynin (DITROPAN) 5 MG tablet    Sig: Take 1 tablet by mouth. 2 to 3 times a day    Refill:  2  . atorvastatin (LIPITOR) 20 MG tablet    Sig: Take 1 tablet by mouth at bedtime.  Marland Kitchen DISCONTD: omeprazole (PRILOSEC) 20 MG capsule    Sig: Take 1 capsule by  mouth daily.  Marland Kitchen DISCONTD: losartan (COZAAR) 50 MG tablet    Sig: Take 1 tablet by mouth daily.  Marland Kitchen DISCONTD: aspirin EC 325 MG tablet    Sig: Take 1 tablet by mouth daily.  . Multiple Vitamins-Minerals (ICAPS AREDS 2) CAPS    Sig: Take 2 capsules by mouth daily.    Laroy Apple, MD [facility] 12/20/2016, 2:33 PM

## 2016-12-20 NOTE — Patient Instructions (Signed)
Great to meet you!  Come back in 6  Months unless you need [place] sooner.

## 2016-12-21 LAB — CBC WITH DIFFERENTIAL/PLATELET
BASOS: 1 %
Basophils Absolute: 0 10*3/uL (ref 0.0–0.2)
EOS (ABSOLUTE): 0.3 10*3/uL (ref 0.0–0.4)
EOS: 5 %
HEMOGLOBIN: 13.4 g/dL (ref 11.1–15.9)
Hematocrit: 40.8 % (ref 34.0–46.6)
Immature Grans (Abs): 0 10*3/uL (ref 0.0–0.1)
Immature Granulocytes: 1 %
Lymphocytes Absolute: 2.3 10*3/uL (ref 0.7–3.1)
Lymphs: 34 %
MCH: 31.8 pg (ref 26.6–33.0)
MCHC: 32.8 g/dL (ref 31.5–35.7)
MCV: 97 fL (ref 79–97)
MONOCYTES: 10 %
MONOS ABS: 0.6 10*3/uL (ref 0.1–0.9)
Neutrophils Absolute: 3.3 10*3/uL (ref 1.4–7.0)
Neutrophils: 49 %
Platelets: 193 10*3/uL (ref 150–379)
RBC: 4.21 x10E6/uL (ref 3.77–5.28)
RDW: 13.3 % (ref 12.3–15.4)
WBC: 6.6 10*3/uL (ref 3.4–10.8)

## 2016-12-21 LAB — CMP14+EGFR
ALBUMIN: 4.5 g/dL (ref 3.5–4.7)
ALT: 17 IU/L (ref 0–32)
AST: 21 IU/L (ref 0–40)
Albumin/Globulin Ratio: 2.1 (ref 1.2–2.2)
Alkaline Phosphatase: 85 IU/L (ref 39–117)
BUN/Creatinine Ratio: 13 (ref 12–28)
BUN: 13 mg/dL (ref 8–27)
Bilirubin Total: 0.3 mg/dL (ref 0.0–1.2)
CALCIUM: 11.1 mg/dL — AB (ref 8.7–10.3)
CO2: 23 mmol/L (ref 20–29)
Chloride: 104 mmol/L (ref 96–106)
Creatinine, Ser: 1.04 mg/dL — ABNORMAL HIGH (ref 0.57–1.00)
GFR calc Af Amer: 57 mL/min/{1.73_m2} — ABNORMAL LOW (ref >59)
GFR, EST NON AFRICAN AMERICAN: 50 mL/min/{1.73_m2} — AB (ref >59)
GLOBULIN, TOTAL: 2.1 g/dL (ref 1.5–4.5)
GLUCOSE: 108 mg/dL — AB (ref 65–99)
Potassium: 4.4 mmol/L (ref 3.5–5.2)
SODIUM: 144 mmol/L (ref 134–144)
Total Protein: 6.6 g/dL (ref 6.0–8.5)

## 2016-12-21 LAB — LDL CHOLESTEROL, DIRECT: LDL Direct: 27 mg/dL (ref 0–99)

## 2016-12-22 ENCOUNTER — Other Ambulatory Visit: Payer: Self-pay | Admitting: Family Medicine

## 2016-12-22 ENCOUNTER — Telehealth: Payer: Self-pay | Admitting: Family Medicine

## 2016-12-22 NOTE — Telephone Encounter (Signed)
Pt aware of results 

## 2016-12-23 ENCOUNTER — Telehealth: Payer: Self-pay | Admitting: Family Medicine

## 2017-01-03 ENCOUNTER — Other Ambulatory Visit: Payer: Medicare HMO

## 2017-01-04 LAB — PTH, INTACT AND CALCIUM
Calcium: 10.4 mg/dL — ABNORMAL HIGH (ref 8.7–10.3)
PTH: 62 pg/mL (ref 15–65)

## 2017-01-09 ENCOUNTER — Ambulatory Visit (INDEPENDENT_AMBULATORY_CARE_PROVIDER_SITE_OTHER): Payer: Medicare HMO | Admitting: Family

## 2017-01-09 ENCOUNTER — Encounter: Payer: Self-pay | Admitting: Family

## 2017-01-09 VITALS — BP 142/76 | HR 86 | Temp 96.8°F | Ht 59.0 in | Wt 142.0 lb

## 2017-01-09 DIAGNOSIS — L259 Unspecified contact dermatitis, unspecified cause: Secondary | ICD-10-CM

## 2017-01-09 MED ORDER — TRIAMCINOLONE ACETONIDE 0.5 % EX OINT: 1.0000 "application " | TOPICAL_OINTMENT | Freq: Two times a day (BID) | CUTANEOUS | 0 refills | Status: DC

## 2017-01-09 NOTE — Patient Instructions (Signed)

## 2017-01-09 NOTE — Progress Notes (Signed)
   Subjective:    Patient ID: Amanda [name], female    DOB: 22-Mar-1931, 83 y.o.   MRN: [id number]  Rash  This is a recurrent problem. The current episode started 1 to 4 weeks ago. The problem has been waxing and waning since onset. The affected locations include the chest, left lower leg, right lower leg, right arm and left arm. The rash is characterized by redness and itchiness. She was exposed to plant contact. Pertinent negatives include no congestion, cough, fatigue, fever, joint pain, rhinorrhea, shortness of breath or sore throat. Past treatments include anti-itch cream and cold compress. The treatment provided mild relief.      Review of Systems  Constitutional: Negative for fatigue and fever.  HENT: Negative for congestion, rhinorrhea and sore throat.   Respiratory: Negative for cough and shortness of breath.   Musculoskeletal: Negative for joint pain.  Skin: Positive for rash.  All other systems reviewed and are negative.      Objective:   Physical Exam  Constitutional: She is oriented to person, place, and time. She appears well-developed and well-nourished. No distress.  HENT:  Head: Normocephalic.  Cardiovascular: Normal rate, regular rhythm, normal heart sounds and intact distal pulses.   No murmur heard. Pulmonary/Chest: Effort normal and breath sounds normal. No respiratory distress. She has no wheezes.  Abdominal: Soft. Bowel sounds are normal. She exhibits no distension. There is no tenderness.  Musculoskeletal: Normal range of motion. She exhibits no edema or tenderness.  Neurological: She is alert and oriented to person, place, and time.  Skin: Skin is warm and dry. Rash noted. Rash is papular (erythemas papular rash outlining clothing on chest, lower arm, and lower feet).  Psychiatric: She has a normal mood and affect. Her behavior is normal. Judgment and thought content normal.  Vitals reviewed.   BP (!) 142/76   Pulse 86   Temp (!) 96.8 F (36 C) (Oral)   Ht  4\' 11"  (1.499 m)   Wt 142 lb (64.4 kg)   BMI 28.68 kg/m      Assessment & Plan:  1. Contact dermatitis, unspecified contact dermatitis type, unspecified trigger Do not scratch  Cool compresses  Avoid irritants  Cool baths RTO prn  - triamcinolone ointment (KENALOG) 0.5 %; Apply 1 application topically 2 (two) times daily.  Dispense: 60 g; Refill: 0   Evelina Dun, FNP

## 2017-02-13 ENCOUNTER — Telehealth: Payer: Self-pay | Admitting: Family Medicine

## 2017-02-13 NOTE — Telephone Encounter (Signed)
Patient aware of recommendation.  

## 2017-02-13 NOTE — Telephone Encounter (Signed)
What symptoms do you have? Diarrhea, stomach cramps, severe stomach pains, she has been taking pepto bismol does not seem to help it she is going every 10 to 15 mins but not doing much just feels like she has to go  How long have you been sick? Yesterday at 5 am  Have you been seen for this problem? no  If your provider decides to give you a prescription, which pharmacy would you like for it to be sent to? [facility]   Patient informed that this information will be sent to the clinical staff for review and that they should receive a follow up call.

## 2017-02-13 NOTE — Telephone Encounter (Signed)
Agree with recommendations, there is nothing to do at this point in a diarrheal illness. If pain is very severe she should probably go to the ED considering age.   Recommend aggressive fluids.   Laroy Apple, MD [facility] 02/13/2017, 11:26 AM

## 2017-02-13 NOTE — Telephone Encounter (Signed)
I advised patient should be seen. Patient states that there was no way she could come in and be seen. She states that diarrhea is happening every 10-15 minutes. I advised patient to try immodium to see if it can slow it down so we can get her in here to be seen.

## 2017-04-05 ENCOUNTER — Other Ambulatory Visit: Payer: Self-pay | Admitting: *Deleted

## 2017-04-05 MED ORDER — LOSARTAN POTASSIUM 50 MG PO TABS: 50.0000 mg | ORAL_TABLET | Freq: Every day | ORAL | 3 refills | Status: DC

## 2017-04-05 MED ORDER — OMEPRAZOLE 20 MG PO CPDR: 20.0000 mg | DELAYED_RELEASE_CAPSULE | Freq: Every day | ORAL | 3 refills | Status: DC

## 2017-04-05 MED ORDER — ATORVASTATIN CALCIUM 20 MG PO TABS: 20.0000 mg | ORAL_TABLET | Freq: Every day | ORAL | 3 refills | Status: DC

## 2017-04-05 MED ORDER — VERAPAMIL HCL ER 120 MG PO TBCR
120.0000 mg | EXTENDED_RELEASE_TABLET | Freq: Every day | ORAL | 3 refills | Status: DC
Start: 2017-04-05 — End: 2018-04-03

## 2017-04-05 NOTE — Telephone Encounter (Signed)
Pt notified RXs sent in to [facility]

## 2017-04-05 NOTE — Telephone Encounter (Signed)
Refills sent in for verapamil, losartan, Lipitor, and omeprazole.  We will ask nursing to confirm correct meds were sent  Laroy Apple, MD [facility] 04/05/2017, 5:08 PM

## 2017-06-12 ENCOUNTER — Ambulatory Visit: Payer: Medicare HMO

## 2017-06-14 ENCOUNTER — Encounter: Payer: Self-pay | Admitting: Family Medicine

## 2017-07-18 ENCOUNTER — Ambulatory Visit (INDEPENDENT_AMBULATORY_CARE_PROVIDER_SITE_OTHER): Payer: Medicare HMO | Admitting: Family Medicine

## 2017-07-18 ENCOUNTER — Encounter: Payer: Self-pay | Admitting: Family Medicine

## 2017-07-18 VITALS — BP 156/82 | HR 73 | Temp 97.2°F | Ht 59.0 in | Wt 140.0 lb

## 2017-07-18 DIAGNOSIS — K529 Noninfective gastroenteritis and colitis, unspecified: Secondary | ICD-10-CM | POA: Diagnosis not present

## 2017-07-18 DIAGNOSIS — R5383 Other fatigue: Secondary | ICD-10-CM | POA: Diagnosis not present

## 2017-07-18 DIAGNOSIS — R0982 Postnasal drip: Secondary | ICD-10-CM | POA: Diagnosis not present

## 2017-07-18 DIAGNOSIS — R05 Cough: Secondary | ICD-10-CM | POA: Diagnosis not present

## 2017-07-18 DIAGNOSIS — R059 Cough, unspecified: Secondary | ICD-10-CM

## 2017-07-18 MED ORDER — LORATADINE 10 MG PO TABS: 10.0000 mg | ORAL_TABLET | Freq: Every day | ORAL | 11 refills | Status: DC

## 2017-07-18 NOTE — Progress Notes (Signed)
 Subjective: CC:? flu PCP: Bradshaw, Samuel L, MD HPI:Amanda [name] is a 84 y.o. female presenting to clinic today for:  1. Flu like symptoms  Patient reports mildly productive cough, nasal congestion and postnasal drip that started 2 weeks ago.  Denies hemoptysis, sinus pressure, headache, SOB, dizziness, rash, nausea, vomiting, fevers, chills, myalgia, sick contacts, recent travel.  Patient has used did nothing for symptoms.  Denies history of COPD or asthma.  No current tobacco use/ exposure.  2. Poor energy Patient reports that she has had poor energy for about a month and a half.  She notes that it seems to correlate with the sun going down early.  However she does report chronic intermittent diarrhea.  Denies any hematochezia, melena, fevers, chills, unplanned weight loss.  No personal or family history of thyroid disorder.  She does report cold intolerance.  ROS: Per HPI  Allergies  Allergen Reactions  . Livalo [Pitavastatin] Other (See Comments)    Causes dizziness  . Simvastatin Other (See Comments)    Causes dizziness   Past Medical History:  Diagnosis Date  . Coronary artery disease    LAD 30% followed by 60% stenosis; pressure wire measurement demonstrated no significant gradient; circumflex had 30% stenosis, right coronary artery has 40% stenosed; EF 75-80%  . Diverticulosis   . GERD (gastroesophageal reflux disease)   . Hyperlipidemia   . Hypertension   . Stroke (HCC) 2003   With a left MCA occlusion  . TIA (transient ischemic attack)     Current Outpatient Medications:  .  alendronate (FOSAMAX) 70 MG tablet, , Disp: , Rfl:  .  aspirin EC 325 MG EC tablet, Take 1 tablet (325 mg total) by mouth daily., Disp: 30 tablet, Rfl: 0 .  atorvastatin (LIPITOR) 20 MG tablet, Take 1 tablet (20 mg total) by mouth at bedtime., Disp: 90 tablet, Rfl: 3 .  losartan (COZAAR) 50 MG tablet, Take 1 tablet (50 mg total) by mouth daily., Disp: 90 tablet, Rfl: 3 .  Multiple  Vitamins-Minerals (ICAPS AREDS 2) CAPS, Take 2 capsules by mouth daily., Disp: , Rfl:  .  omeprazole (PRILOSEC) 20 MG capsule, Take 1 capsule (20 mg total) by mouth daily., Disp: 90 capsule, Rfl: 3 .  verapamil (CALAN-SR) 120 MG CR tablet, Take 1 tablet (120 mg total) by mouth at bedtime., Disp: 90 tablet, Rfl: 3 Social History   Socioeconomic History  . Marital status: Widowed    Spouse name: Not on file  . Number of children: Not on file  . Years of education: Not on file  . Highest education level: Not on file  Social Needs  . Financial resource strain: Not on file  . Food insecurity - worry: Not on file  . Food insecurity - inability: Not on file  . Transportation needs - medical: Not on file  . Transportation needs - non-medical: Not on file  Occupational History  . Occupation: [employer]    Employer: RETIRED    Comment: Retired  Tobacco Use  . Smoking status: Former Smoker    Last attempt to quit: 06/06/1985    Years since quitting: 32.1  . Smokeless tobacco: Never Used  . Tobacco comment: Approximately 10-pack-year history  Substance and Sexual Activity  . Alcohol use: No  . Drug use: No  . Sexual activity: Not on file  Other Topics Concern  . Not on file  Social History Narrative   Lives in [place] with her son.   Has been widowed for about   3 years.   Family History  Problem Relation Age of Onset  . Heart attack Sister   . Heart attack Brother     Objective: Office vital signs reviewed. BP (!) 156/82   Pulse 73   Temp (!) 97.2 F (36.2 C) (Oral)   Ht 4' 11" (1.499 m)   Wt 140 lb (63.5 kg)   SpO2 97%   BMI 28.28 kg/m   Physical Examination:  General: Awake, alert, well nourished, well appearing female, appears younger than stated age, No acute distress HEENT: Normal    Neck: No masses palpated. No lymphadenopathy    Ears: Tympanic membranes intact, normal light reflex, no erythema, no bulging    Eyes: PERRLA, extraocular membranes intact, sclera white     Nose: nasal turbinates moist, clear nasal discharge    Throat: moist mucus membranes, cobblestone appearance of oropharynx.  Airway is patent Cardio: regular rate and rhythm, S1S2 heard, no murmurs appreciated Pulm: clear to auscultation bilaterally, no wheezes, rhonchi or rales; normal work of breathing on room air  Assessment/ Plan: 84 y.o. female   1. Chronic diarrhea We will check electrolytes, kidney function and CBC.  Previous labs were reviewed; renal function was slightly impaired in July of last year. - CMP14+EGFR - CBC with Differential  2. Low energy Possibly related to diarrheal illness versus undiagnosed hypothyroidism versus seasonal change - CMP14+EGFR - CBC with Differential - TSH  3. Post-nasal drip No evidence of infection on today's exam.  She is relatively well-appearing.  Will treat as allergic rhinitis with Claritin 10 mg daily.  If persistent, could consider adding Flonase.  4. Cough Likely secondary to postnasal drip.  Pulmonary exam unremarkable today.  Strict return precautions and reasons for emergent evaluation in the emergency department review with patient.  They voiced understanding and will follow-up as needed.  Patient's blood pressure was not at goal despite use of prescribed blood pressure medications.  I did recommend that she follow-up with her PCP within the next 2 weeks for blood pressure recheck.  Elevation may be secondary to above issues.   Orders Placed This Encounter  Procedures  . CMP14+EGFR  . CBC with Differential  . TSH   Meds ordered this encounter  Medications  . loratadine (CLARITIN) 10 MG tablet    Sig: Take 1 tablet (10 mg total) by mouth daily.    Dispense:  30 tablet    Refill:  11      M , DO [facility] [phone]   

## 2017-07-18 NOTE — Patient Instructions (Signed)
You had labs performed today.  You will be contacted with the results of the labs once they are available, usually in the next 3 days for routine lab work.   Postnasal Drip Postnasal drip is the feeling of mucus going down the back of your throat. Mucus is a slimy substance that moistens and cleans your nose and throat, as well as the air pockets in face bones near your forehead and cheeks (sinuses). Small amounts of mucus pass from your nose and sinuses down the back of your throat all the time. This is normal. When you produce too much mucus or the mucus gets too thick, you can feel it. Some common causes of postnasal drip include:  Having more mucus because of: ? A cold or the flu. ? Allergies. ? Cold air. ? Certain medicines.  Having more mucus that is thicker because of: ? A sinus or nasal infection. ? Dry air. ? A food allergy.  Follow these instructions at home: Relieving discomfort  Gargle with a salt-water mixture 3-4 times a day or as needed. To make a salt-water mixture, completely dissolve -1 tsp of salt in 1 cup of warm water.  If the air in your home is dry, use a humidifier to add moisture to the air.  Use a saline spray or container (neti pot) to flush out the nose (nasal irrigation). These methods can help clear away mucus and keep the nasal passages moist. General instructions  Take over-the-counter and prescription medicines only as told by your health care provider.  Follow instructions from your health care provider about eating or drinking restrictions. You may need to avoid caffeine.  Avoid things that you know you are allergic to (allergens), like dust, mold, pollen, pets, or certain foods.  Drink enough fluid to keep your urine pale yellow.  Keep all follow-up visits as told by your health care provider. This is important. Contact a health care provider if:  You have a fever.  You have a sore throat.  You have difficulty swallowing.  You have  headache.  You have sinus pain.  You have a cough that does not go away.  The mucus from your nose becomes thick and is green or yellow in color.  You have cold or flu symptoms that last more than 10 days. Summary  Postnasal drip is the feeling of mucus going down the back of your throat.  If your health care provider approves, use nasal irrigation or a nasal spray 2?4 times a day.  Avoid things that you know you are allergic to (allergens), like dust, mold, pollen, pets, or certain foods. This information is not intended to replace advice given to you by your health care provider. Make sure you discuss any questions you have with your health care provider. Document Released: 09/05/2016 Document Revised: 09/05/2016 Document Reviewed: 09/05/2016 Elsevier Interactive Patient Education  [employer].

## 2017-07-19 LAB — CBC WITH DIFFERENTIAL/PLATELET
BASOS ABS: 0 10*3/uL (ref 0.0–0.2)
Basos: 1 %
EOS (ABSOLUTE): 0.3 10*3/uL (ref 0.0–0.4)
Eos: 5 %
HEMOGLOBIN: 13.9 g/dL (ref 11.1–15.9)
Hematocrit: 42.3 % (ref 34.0–46.6)
IMMATURE GRANS (ABS): 0 10*3/uL (ref 0.0–0.1)
Immature Granulocytes: 0 %
LYMPHS: 31 %
Lymphocytes Absolute: 2.1 10*3/uL (ref 0.7–3.1)
MCH: 31.2 pg (ref 26.6–33.0)
MCHC: 32.9 g/dL (ref 31.5–35.7)
MCV: 95 fL (ref 79–97)
MONOCYTES: 12 %
Monocytes Absolute: 0.8 10*3/uL (ref 0.1–0.9)
Neutrophils Absolute: 3.4 10*3/uL (ref 1.4–7.0)
Neutrophils: 51 %
Platelets: 219 10*3/uL (ref 150–379)
RBC: 4.45 x10E6/uL (ref 3.77–5.28)
RDW: 13.5 % (ref 12.3–15.4)
WBC: 6.6 10*3/uL (ref 3.4–10.8)

## 2017-07-19 LAB — CMP14+EGFR
ALK PHOS: 81 IU/L (ref 39–117)
ALT: 15 IU/L (ref 0–32)
AST: 13 IU/L (ref 0–40)
Albumin/Globulin Ratio: 1.9 (ref 1.2–2.2)
Albumin: 4.6 g/dL (ref 3.5–4.7)
BILIRUBIN TOTAL: 0.3 mg/dL (ref 0.0–1.2)
BUN/Creatinine Ratio: 16 (ref 12–28)
BUN: 15 mg/dL (ref 8–27)
CHLORIDE: 105 mmol/L (ref 96–106)
CO2: 23 mmol/L (ref 20–29)
Calcium: 10.6 mg/dL — ABNORMAL HIGH (ref 8.7–10.3)
Creatinine, Ser: 0.95 mg/dL (ref 0.57–1.00)
GFR calc Af Amer: 64 mL/min/{1.73_m2} (ref >59)
GFR calc non Af Amer: 55 mL/min/{1.73_m2} — ABNORMAL LOW (ref >59)
Globulin, Total: 2.4 g/dL (ref 1.5–4.5)
Glucose: 86 mg/dL (ref 65–99)
Potassium: 4.2 mmol/L (ref 3.5–5.2)
Sodium: 144 mmol/L (ref 134–144)
TOTAL PROTEIN: 7 g/dL (ref 6.0–8.5)

## 2017-07-19 LAB — TSH: TSH: 3.33 u[IU]/mL (ref 0.450–4.500)

## 2017-07-26 ENCOUNTER — Ambulatory Visit: Payer: Medicare HMO

## 2017-07-27 ENCOUNTER — Encounter: Payer: Self-pay | Admitting: Family Medicine

## 2017-08-01 ENCOUNTER — Ambulatory Visit (INDEPENDENT_AMBULATORY_CARE_PROVIDER_SITE_OTHER): Payer: Medicare HMO | Admitting: Family Medicine

## 2017-08-01 ENCOUNTER — Encounter: Payer: Self-pay | Admitting: Family Medicine

## 2017-08-01 VITALS — BP 164/86 | HR 83 | Temp 96.8°F | Ht 59.0 in | Wt 141.2 lb

## 2017-08-01 DIAGNOSIS — I10 Essential (primary) hypertension: Secondary | ICD-10-CM

## 2017-08-01 MED ORDER — LOSARTAN POTASSIUM 50 MG PO TABS: 50.0000 mg | ORAL_TABLET | Freq: Two times a day (BID) | ORAL | 3 refills | Status: DC

## 2017-08-01 NOTE — Progress Notes (Signed)
   HPI  Patient presents today to follow-up for hypertension.  Patient was seen for sick visit couple weeks ago and had elevated blood pressure.  We reviewed her medications carefully together. She tolerates losartan and verapamil well.  She has history of TIA x4 only residual right upper extremity weakness that is mild  PMH: Smoking status noted ROS: Per HPI  Objective: BP (!) 164/86   Pulse 83   Temp (!) 96.8 F (36 C) (Oral)   Ht 4\' 11"  (1.499 m)   Wt 141 lb 3.2 oz (64 kg)   BMI 28.52 kg/m  Gen: NAD, alert, cooperative with exam HEENT: NCAT CV: RRR, good S1/S2, no murmur Resp: CTABL, no wheezes, non-labored Ext: No edema, warm Neuro: Alert and oriented, No gross deficits  Assessment and plan:  #Hypertension Uncontrolled Considering history of TIA would recommend systolic goal of 492, we will start a slow progression of medicine to correct her blood pressure and shoot for 120 as tolerated. For now increase losartan to 50 mg twice daily Continue verapamil at current dose    Meds ordered this encounter  Medications  . losartan (COZAAR) 50 MG tablet    Sig: Take 1 tablet (50 mg total) by mouth 2 (two) times daily.    Dispense:  180 tablet    Refill:  [place], MD [place] 08/01/2017, 9:27 AM

## 2017-08-01 NOTE — Patient Instructions (Signed)
Great to meet you!  Increase losartan to 1 pill twice daily, I have sent a new Rx for you.   Come  Back in 3-4 weeks for repeat blood pressure check

## 2017-08-07 ENCOUNTER — Ambulatory Visit (INDEPENDENT_AMBULATORY_CARE_PROVIDER_SITE_OTHER): Payer: Medicare HMO | Admitting: *Deleted

## 2017-08-07 VITALS — BP 161/80 | HR 69 | Temp 97.2°F | Ht 59.0 in | Wt 142.0 lb

## 2017-08-07 DIAGNOSIS — Z0000 Encounter for general adult medical examination without abnormal findings: Secondary | ICD-10-CM

## 2017-08-07 NOTE — Progress Notes (Signed)
Subjective:   Amanda [name] is a 84 y.o. female who presents for Medicare Annual (Subsequent) preventive examination. She is retired from Orthoptist since 1996. She works several part time jobs in [employer]. 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 She enjoys word search puzzles, TV and candle making. For exercise she walks and she states that she eats somewhat healthy. She is very active in the community and in church. She works with "[employer]", she is in the choir and she attends two church services every Sunday. She lives in her home and her son lives there with her. She also has one daughter who lives local. She has one dog and fall hazards were discussed today. She states that she is doing well and that her health is better than it was a year ago.   Cardiac Risk Factors include: hypertension;advanced age (>62men, >68 women)     Objective:     Vitals: BP (!) 161/80   Pulse 69   Temp (!) 97.2 F (36.2 C) (Oral)   Ht 4\' 11"  (1.499 m)   Wt 142 lb (64.4 kg)   BMI 28.68 kg/m   Body mass index is 28.68 kg/m.  Advanced Directives 08/07/2017  Does Patient Have a Medical Advance Directive? No;Yes  Type of Paramedic of [place];Living will  Does patient want to make changes to medical advance directive? No - Patient declined  Copy of [place] in Chart? No - copy requested  Would patient like information on creating a medical advance directive? No - Patient declined    Tobacco Social History   Tobacco Use  Smoking Status Former Smoker  . Last attempt to quit: 06/06/1985  . Years since quitting: 32.1  Smokeless Tobacco Never Used  Tobacco Comment   Approximately 10-pack-year history     Counseling given: Not Answered Comment: Approximately 10-pack-year history   Clinical Intake:                       Past Medical History:  Diagnosis Date  . Allergy   . Anxiety   . Coronary  artery disease    LAD 30% followed by 60% stenosis; pressure wire measurement demonstrated no significant gradient; circumflex had 30% stenosis, right coronary artery has 40% stenosed; EF 75-80%  . Diverticulosis   . GERD (gastroesophageal reflux disease)   . Hyperlipidemia   . Hypertension   . Stroke [facility]) 2003   With a left MCA occlusion  . TIA (transient ischemic attack)    Past Surgical History:  Procedure Laterality Date  . ABDOMINAL HYSTERECTOMY     partial and complete - x  surgeries  . APPENDECTOMY    . CHOLECYSTECTOMY    . EYE SURGERY Bilateral    cataracts  . Hysterectomy-type unspecified    . TONSILLECTOMY     Family History  Problem Relation Age of Onset  . Cancer Mother        colonoscopy   . Cancer Father        unsure if cancer  --- tumor on brain  . Heart attack Sister   . Heart attack Brother   . Diabetes Brother   . GI problems Son   . COPD Sister   . Hypertension Brother   . Heart disease Brother   . Heart attack Brother   . Heart disease Brother   . Alcohol abuse Brother    Social  History   Socioeconomic History  . Marital status: Widowed    Spouse name: None  . Number of children: None  . Years of education: None  . Highest education level: None  Social Needs  . Financial resource strain: None  . Food insecurity - worry: None  . Food insecurity - inability: None  . Transportation needs - medical: None  . Transportation needs - non-medical: None  Occupational History  . Occupation: Horticulturist, commercial: RETIRED    Comment: Retired  Tobacco Use  . Smoking status: Former Smoker    Last attempt to quit: 06/06/1985    Years since quitting: 32.1  . Smokeless tobacco: Never Used  . Tobacco comment: Approximately 10-pack-year history  Substance and Sexual Activity  . Alcohol use: No  . Drug use: No  . Sexual activity: None  Other Topics Concern  . None  Social History Narrative   Lives in [place] with her son.   Has been widowed for about  3 years.    Outpatient Encounter Medications as of 08/07/2017  Medication Sig  . alendronate (FOSAMAX) 70 MG tablet Take 70 mg by mouth once a week.   Marland Kitchen aspirin EC 325 MG EC tablet Take 1 tablet (325 mg total) by mouth daily. (Patient taking differently: Take 325 mg by mouth daily. Takes 1/2 a day)  . atorvastatin (LIPITOR) 20 MG tablet Take 1 tablet (20 mg total) by mouth at bedtime.  Marland Kitchen losartan (COZAAR) 50 MG tablet Take 1 tablet (50 mg total) by mouth 2 (two) times daily. (Patient taking differently: Take 100 mg by mouth daily. )  . Multiple Vitamins-Minerals (ICAPS AREDS 2) CAPS Take 2 capsules by mouth daily.  Marland Kitchen omeprazole (PRILOSEC) 20 MG capsule Take 1 capsule (20 mg total) by mouth daily.  . verapamil (CALAN-SR) 120 MG CR tablet Take 1 tablet (120 mg total) by mouth at bedtime.  . vitamin E 1000 UNIT capsule Take 1,000 Units by mouth daily.  . [DISCONTINUED] loratadine (CLARITIN) 10 MG tablet Take 1 tablet (10 mg total) by mouth daily.   No facility-administered encounter medications on file as of 08/07/2017.     Activities of Daily Living In your present state of health, do you have any difficulty performing the following activities: 08/07/2017  Hearing? N  Vision? N  Difficulty concentrating or making decisions? N  Walking or climbing stairs? Y  Dressing or bathing? N  Doing errands, shopping? N  Preparing Food and eating ? N  Using the Toilet? N  In the past six months, have you accidently leaked urine? N  Do you have problems with loss of bowel control? N  Managing your Medications? N  Managing your Finances? N  Housekeeping or managing your Housekeeping? N  Some recent data might be hidden    Patient Care Team: Timmothy Euler, MD as PCP - General (Family Medicine) Cathe Mons, MD (Internal Medicine) Harlen Labs, MD as Referring Physician (Optometry)    Assessment:   This is a routine wellness examination for [place].  Exercise Activities and Dietary  recommendations Current Exercise Habits: Home exercise routine, Type of exercise: walking, Time (Minutes): 20, Frequency (Times/Week): 2, Weekly Exercise (Minutes/Week): 40, Intensity: Mild, Exercise limited by: None identified  Goals    . Increase physical activity     Stay active - she would like to find another part time job     . Prevent falls       Fall Risk Fall Risk  08/07/2017 08/01/2017 07/18/2017 01/09/2017 12/20/2016  Falls in the past year? No No No Yes Yes  Number falls in past yr: - - - 2 or more 2 or more  Injury with Fall? - - - No No   Is the patient's home free of loose throw rugs in walkways, pet beds, electrical cords, etc?  Fall hazards and risks were discussed today.  Depression Screen PHQ 2/9 Scores 08/07/2017 08/01/2017 07/18/2017 01/09/2017  PHQ - 2 Score 0 0 0 0  PHQ- 9 Score - - - -     Cognitive Function MMSE - Mini Mental State Exam 08/07/2017  Orientation to time 5  Orientation to Place 5  Registration 3  Attention/ Calculation 5  Recall 3  Language- name 2 objects 2  Language- repeat 1  Language- follow 3 step command 3  Language- read & follow direction 1  Write a sentence 1  Copy design 1  Total score 30         There is no immunization history on file for this patient.  Qualifies for Shingles Vaccine? She denies the shingrix and thinks she had the Zostavax years ago, although we do not have it on file here.   Screening Tests Health Maintenance  Topic Date Due  . INFLUENZA VACCINE  09/09/2017 (Originally 01/04/2017)  . PNA vac Low Risk Adult (1 of 2 - PCV13) 08/01/2018 (Originally 04/04/1998)  . TETANUS/TDAP  08/08/2018 (Originally 04/04/1950)  . DEXA SCAN  Completed    Cancer Screenings: Lung: Low Dose CT Chest recommended if Age 58-80 years, 30 pack-year currently moking OR have quit w/in 15years. Patient does qualify. Breast:  Up to date on Mammogram? Yes   Up to date of Bone Density/Dexa? Yes  Additional Screenings:  Hepatitis  B/HIV/Syphillis: Hepatitis C Screening:  Denied     Plan:   she will keep follow up with DR Wendi Snipes and other specialist  She will bring in a copy of her advanced directives. She has a f/u planned for 08/24/17 - for a BP check.   I have personally reviewed and noted the following in the patient's chart:   . Medical and social history . Use of alcohol, tobacco or illicit drugs  . Current medications and supplements . Functional ability and status . Nutritional status . Physical activity . Advanced directives . List of other physicians . Hospitalizations, surgeries, and ER visits in previous 12 months . Vitals . Screenings to include cognitive, depression, and falls . Referrals and appointments  In addition, I have reviewed and discussed with patient certain preventive protocols, quality metrics, and best practice recommendations. A written personalized care plan for preventive services as well as general preventive health recommendations were provided to patient.     [name] Peacock, Cameron Proud, LPN  12/04/2456   I have reviewed and agree with the above AWV documentation.   Laroy Apple, MD [facility] 08/08/2017, 2:27 PM

## 2017-08-07 NOTE — Patient Instructions (Signed)
  Ms. [place] , Thank you for taking time to come for your Medicare Wellness Visit. I appreciate your ongoing commitment to your health goals. Please review the following plan we discussed and let me know if I can assist you in the future.   These are the goals we discussed: Goals    . Increase physical activity     Stay active - she would like to find another part time job     . Prevent falls       This is a list of the screening recommended for you and due dates:  Health Maintenance  Topic Date Due  . Flu Shot  09/09/2017*  . Pneumonia vaccines (1 of 2 - PCV13) 08/01/2018*  . Tetanus Vaccine  08/08/2018*  . DEXA scan (bone density measurement)  Completed  *Topic was postponed. The date shown is not the original due date.    Keep follow up with DR Wendi Snipes (and then Dr Lajuana Ripple) Keep a check on your BP and see DR Wendi Snipes on the 21st Bring in a copy of advanced directives.

## 2017-08-09 ENCOUNTER — Other Ambulatory Visit: Payer: Self-pay | Admitting: *Deleted

## 2017-08-10 MED ORDER — ALENDRONATE SODIUM 70 MG PO TABS: 70.0000 mg | ORAL_TABLET | ORAL | 3 refills | Status: DC

## 2017-08-24 ENCOUNTER — Ambulatory Visit (INDEPENDENT_AMBULATORY_CARE_PROVIDER_SITE_OTHER): Payer: Medicare HMO | Admitting: Family Medicine

## 2017-08-24 ENCOUNTER — Encounter: Payer: Self-pay | Admitting: Family Medicine

## 2017-08-24 VITALS — BP 155/84 | HR 75 | Temp 96.7°F | Ht 59.0 in | Wt 142.6 lb

## 2017-08-24 DIAGNOSIS — I10 Essential (primary) hypertension: Secondary | ICD-10-CM | POA: Diagnosis not present

## 2017-08-24 MED ORDER — HYDROCHLOROTHIAZIDE 25 MG PO TABS: 25.0000 mg | ORAL_TABLET | Freq: Every day | ORAL | 3 refills | Status: DC

## 2017-08-24 MED ORDER — LISINOPRIL 40 MG PO TABS: 40.0000 mg | ORAL_TABLET | Freq: Every day | ORAL | 3 refills | Status: DC

## 2017-08-24 NOTE — Progress Notes (Signed)
   HPI  Patient presents today to follow-up for hypertension.  Patient had her losartan titrated last visit, the most of this is been recalled since.  Patient is tolerating medication well. Not checking blood pressure at home. Denies headache or chest pain.  Has history of TIA, has mild swelling in the legs at times  PMH: Smoking status noted ROS: Per HPI  Objective: BP (!) 155/84   Pulse 75   Temp (!) 96.7 F (35.9 C) (Oral)   Ht 4\' 11"  (1.499 m)   Wt 142 lb 9.6 oz (64.7 kg)   BMI 28.80 kg/m  Gen: NAD, alert, cooperative with exam HEENT: NCAT CV: RRR, good S1/S2, no murmur Resp: CTABL, no wheezes, non-labored Ext: No edema, warm Neuro: Alert and oriented, No gross deficits  Assessment and plan:  #Hypertension Uncontrolled, improved by about 10 mmHg with increasing losartan to 50 mg Change full dose losartan to full dose lisinopril Adding HCTZ Continue verapamil, was started previous to my treating her  Labs next visit  Meds ordered this encounter  Medications  . lisinopril (PRINIVIL,ZESTRIL) 40 MG tablet    Sig: Take 1 tablet (40 mg total) by mouth daily.    Dispense:  90 tablet    Refill:  3  . hydrochlorothiazide (HYDRODIURIL) 25 MG tablet    Sig: Take 1 tablet (25 mg total) by mouth daily.    Dispense:  90 tablet    Refill:  [place], MD [facility] 08/24/2017, 8:10 AM

## 2017-08-25 ENCOUNTER — Telehealth: Payer: Self-pay | Admitting: Family Medicine

## 2017-08-25 NOTE — Telephone Encounter (Signed)
Pt instructed to continue BP med until she receives med from mail order Pt verbalizes understanding

## 2017-09-05 ENCOUNTER — Encounter: Payer: Self-pay | Admitting: Family Medicine

## 2017-09-05 ENCOUNTER — Ambulatory Visit (INDEPENDENT_AMBULATORY_CARE_PROVIDER_SITE_OTHER): Payer: Medicare HMO | Admitting: Family Medicine

## 2017-09-05 VITALS — BP 117/79 | HR 79 | Temp 96.9°F | Ht 59.0 in | Wt 139.2 lb

## 2017-09-05 DIAGNOSIS — I10 Essential (primary) hypertension: Secondary | ICD-10-CM

## 2017-09-05 DIAGNOSIS — T50905A Adverse effect of unspecified drugs, medicaments and biological substances, initial encounter: Secondary | ICD-10-CM | POA: Diagnosis not present

## 2017-09-05 MED ORDER — TELMISARTAN 80 MG PO TABS
80.0000 mg | ORAL_TABLET | Freq: Every day | ORAL | 3 refills | Status: DC
Start: 2017-09-05 — End: 2017-10-02

## 2017-09-05 NOTE — Patient Instructions (Signed)
Great to see you!  Stop lisinopril, start telmisartan  Come back as scheduled

## 2017-09-05 NOTE — Progress Notes (Signed)
   HPI  Patient presents today here with side effects from blood pressure medication.  Patient was seen about 2 weeks ago and changed from losartan to lisinopril, HCTZ was also added at that time.  Patient states that she has had hallucinations including being sure that there are objects on a shelf and then reaching up not to find any object at all.  She is seeing flashing lights. She has also had confusion, memory loss, and confused speech at times. She denies dysuria, fever, chills, sweats.  She denies any other symptoms or concerns.  She states that this started at the time that she changed her medications.   PMH: Smoking status noted ROS: Per HPI  Objective: BP 117/79   Pulse 79   Temp (!) 96.9 F (36.1 C) (Oral)   Ht 4\' 11"  (1.499 m)   Wt 139 lb 3.2 oz (63.1 kg)   BMI 28.11 kg/m  Gen: NAD, alert, cooperative with exam HEENT: NCAT CV: RRR, good S1/S2, no murmur Resp: CTABL, no wheezes, non-labored Ext: No edema, warm Neuro: Alert and oriented, No gross deficits  Patient denies any suicidal thoughts, thoughts of hurting anyone else  Assessment and plan:  #Hypertension, hallucinations, medication reaction Patient with new onset confusion and hallucinations after changing blood pressure medication. She initially felt that it was due to HCTZ, however we also switched lisinopril at that time.  On review of adverse medication effects there is a less than 1% chance of hallucinations, confusion with lisinopril but not HCTZ. Discontinue lisinopril, changed to Micardis Follow-up as scheduled in 9 days   Meds ordered this encounter  Medications  . telmisartan (MICARDIS) 80 MG tablet    Sig: Take 1 tablet (80 mg total) by mouth daily.    Dispense:  30 tablet    Refill:  [place], MD [place] 09/05/2017, 2:33 PM

## 2017-09-14 ENCOUNTER — Ambulatory Visit (INDEPENDENT_AMBULATORY_CARE_PROVIDER_SITE_OTHER): Payer: Medicare HMO | Admitting: Family Medicine

## 2017-09-14 ENCOUNTER — Encounter: Payer: Self-pay | Admitting: Family Medicine

## 2017-09-14 VITALS — BP 132/71 | HR 69 | Temp 96.8°F | Ht 59.0 in | Wt 137.4 lb

## 2017-09-14 DIAGNOSIS — I10 Essential (primary) hypertension: Secondary | ICD-10-CM | POA: Diagnosis not present

## 2017-09-14 DIAGNOSIS — Z23 Encounter for immunization: Secondary | ICD-10-CM | POA: Diagnosis not present

## 2017-09-14 LAB — BMP8+EGFR
BUN / CREAT RATIO: 21 (ref 12–28)
BUN: 25 mg/dL (ref 8–27)
CO2: 25 mmol/L (ref 20–29)
Calcium: 10.9 mg/dL — ABNORMAL HIGH (ref 8.7–10.3)
Chloride: 101 mmol/L (ref 96–106)
Creatinine, Ser: 1.2 mg/dL — ABNORMAL HIGH (ref 0.57–1.00)
GFR calc Af Amer: 48 mL/min/{1.73_m2} — ABNORMAL LOW (ref >59)
GFR calc non Af Amer: 42 mL/min/{1.73_m2} — ABNORMAL LOW (ref >59)
GLUCOSE: 99 mg/dL (ref 65–99)
POTASSIUM: 4.2 mmol/L (ref 3.5–5.2)
SODIUM: 141 mmol/L (ref 134–144)

## 2017-09-14 NOTE — Progress Notes (Signed)
   HPI  Patient presents today here for follow-up hypertension.  Patient was started on lisinopril and HCTZ on 3/21, patient began having hallucinations and dizziness.  Dizziness was felt to be from orthostasis, however hallucinations could be due to ACE inhibitor.  Lisinopril was stopped, her hallucinations stopped also.  Patient is now taking telmisartan, she is tolerating it well. She has 10 to 20 seconds of dizziness when first standing up out of bed sometimes, she understands to sit up first and then stand up, this usually resolves her symptoms.  We discussed Prevnar, she would like to proceed with that today  PMH: Smoking status noted ROS: Per HPI  Objective: BP 132/71   Pulse 69   Temp (!) 96.8 F (36 C) (Oral)   Ht '4\' 11"'$  (1.499 m)   Wt 137 lb 6.4 oz (62.3 kg)   BMI 27.75 kg/m  Gen: NAD, alert, cooperative with exam HEENT: NCAT CV: RRR, good S1/S2, no murmur Resp: CTABL, no wheezes, non-labored Ext: No edema, warm Neuro: Alert and oriented, No gross deficits  Assessment and plan:  #Hypertension Well controlled on telmisartan plus HCTZ Patient had hallucinations from lisinopril, this was added to the allergy list. labs  #Need for pneumonia vaccine-counseling provided for all vaccines vaccine components     Orders Placed This Encounter  Procedures  . Pneumococcal conjugate vaccine 13-valent  . [place], MD [facility] 09/14/2017, 9:07 AM

## 2017-09-14 NOTE — Patient Instructions (Signed)
Great to see you! Come back to see Dr. Darnell Level in 4 months

## 2017-10-02 ENCOUNTER — Other Ambulatory Visit: Payer: Self-pay | Admitting: *Deleted

## 2017-10-02 MED ORDER — TELMISARTAN 80 MG PO TABS: 80.0000 mg | ORAL_TABLET | Freq: Every day | ORAL | 0 refills | Status: DC

## 2018-01-15 ENCOUNTER — Ambulatory Visit (INDEPENDENT_AMBULATORY_CARE_PROVIDER_SITE_OTHER): Payer: Medicare HMO

## 2018-01-15 ENCOUNTER — Encounter: Payer: Self-pay | Admitting: Family Medicine

## 2018-01-15 ENCOUNTER — Ambulatory Visit (INDEPENDENT_AMBULATORY_CARE_PROVIDER_SITE_OTHER): Payer: Medicare HMO | Admitting: Family Medicine

## 2018-01-15 VITALS — BP 128/66 | HR 65 | Temp 96.9°F | Ht 59.0 in | Wt 135.0 lb

## 2018-01-15 DIAGNOSIS — M810 Age-related osteoporosis without current pathological fracture: Secondary | ICD-10-CM | POA: Diagnosis not present

## 2018-01-15 DIAGNOSIS — Z780 Asymptomatic menopausal state: Secondary | ICD-10-CM | POA: Diagnosis not present

## 2018-01-15 DIAGNOSIS — I10 Essential (primary) hypertension: Secondary | ICD-10-CM

## 2018-01-15 DIAGNOSIS — R2689 Other abnormalities of gait and mobility: Secondary | ICD-10-CM

## 2018-01-15 NOTE — Progress Notes (Signed)
Subjective: CC: HTN PCP: [name], DO XIH:WTUU T Philley is a 84 y.o. female presenting to clinic today for:  1. Hypertension Patient reports compliance with Micardis and hydrochlorothiazide.  She denies any adverse side effects Denies headache, dizziness, nausea, vomiting, chest pain, LE swelling, abdominal pain.  She reports some shortness of breath with going up hills.  Otherwise no shortness of breath with activity.  She notes some problems with vision but she sees an eye specialist who is helping her with this.  Denies any double vision or loss of vision.  2.  Balance issue Patient reports she had many years of balance issues.  She denies any falls but states that when she wakes up each morning it takes her several seconds to collect herself.  At times, she has to hold onto the wall to stabilize herself.  She does have a history of TIA in the past but never any actual CVAs with deficits.  She is not on any sedating medications.  She has mentioned this to her PCP in the past but notes that it was never worked up.    ROS: Per HPI  Allergies  Allergen Reactions  . Livalo [Pitavastatin] Other (See Comments)    Causes dizziness  . Simvastatin Other (See Comments)    Causes dizziness  . Lisinopril     Hallucinations, resolved on ARB   Past Medical History:  Diagnosis Date  . Allergy   . Anxiety   . Coronary artery disease    LAD 30% followed by 60% stenosis; pressure wire measurement demonstrated no significant gradient; circumflex had 30% stenosis, right coronary artery has 40% stenosed; EF 75-80%  . Diverticulosis   . GERD (gastroesophageal reflux disease)   . Hyperlipidemia   . Hypertension   . Stroke [facility]) 2003   With a left MCA occlusion  . TIA (transient ischemic attack)     Current Outpatient Medications:  .  alendronate (FOSAMAX) 70 MG tablet, Take 1 tablet (70 mg total) by mouth once a week., Disp: 12 tablet, Rfl: 3 .  aspirin EC 325 MG EC tablet, Take 1  tablet (325 mg total) by mouth daily. (Patient taking differently: Take 325 mg by mouth daily. Takes 1/2 a day), Disp: 30 tablet, Rfl: 0 .  atorvastatin (LIPITOR) 20 MG tablet, Take 1 tablet (20 mg total) by mouth at bedtime., Disp: 90 tablet, Rfl: 3 .  hydrochlorothiazide (HYDRODIURIL) 25 MG tablet, Take 1 tablet (25 mg total) by mouth daily., Disp: 90 tablet, Rfl: 3 .  Multiple Vitamins-Minerals (ICAPS AREDS 2) CAPS, Take 2 capsules by mouth daily., Disp: , Rfl:  .  omeprazole (PRILOSEC) 20 MG capsule, Take 1 capsule (20 mg total) by mouth daily., Disp: 90 capsule, Rfl: 3 .  telmisartan (MICARDIS) 80 MG tablet, Take 1 tablet (80 mg total) by mouth daily., Disp: 90 tablet, Rfl: 0 .  verapamil (CALAN-SR) 120 MG CR tablet, Take 1 tablet (120 mg total) by mouth at bedtime., Disp: 90 tablet, Rfl: 3 Social History   Socioeconomic History  . Marital status: Widowed    Spouse name: Not on file  . Number of children: Not on file  . Years of education: Not on file  . Highest education level: Not on file  Occupational History  . 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 Financial resource strain: Not on file  . Food insecurity:    Worry: Not on file  Inability: Not on file  . Transportation needs:    Medical: Not on file    Non-medical: Not on file  Tobacco Use  . Smoking status: Former Smoker    Last attempt to quit: 06/06/1985    Years since quitting: 32.6  . Smokeless tobacco: Never Used  . Tobacco comment: Approximately 10-pack-year history  Substance and Sexual Activity  . Alcohol use: No  . Drug use: No  . Sexual activity: Not on file  Lifestyle  . Physical activity:    Days per week: Not on file    Minutes per session: Not on file  . Stress: Not on file  Relationships  . Social connections:    Talks on phone: Not on file    Gets together: Not on file    Attends religious service: Not on file    Active member of club or organization: Not  on file    Attends meetings of clubs or organizations: Not on file    Relationship status: Not on file  . Intimate partner violence:    Fear of current or ex partner: Not on file    Emotionally abused: Not on file    Physically abused: Not on file    Forced sexual activity: Not on file  Other Topics Concern  . Not on file  Social History Narrative   Lives in [place] with her son.   Has been widowed for about 3 years.   Family History  Problem Relation Age of Onset  . Cancer Mother        colonoscopy   . Cancer Father        unsure if cancer  --- tumor on brain  . Heart attack Sister   . Heart attack Brother   . Diabetes Brother   . GI problems Son   . COPD Sister   . Hypertension Brother   . Heart disease Brother   . Heart attack Brother   . Heart disease Brother   . Alcohol abuse Brother     Objective: Office vital signs reviewed. BP 128/66   Pulse 65   Temp (!) 96.9 F (36.1 C) (Oral)   Ht 4\' 11"  (1.499 m)   Wt 135 lb (61.2 kg)   BMI 27.27 kg/m   Physical Examination:  General: Awake, alert, well nourished, No acute distress HEENT: Normal    Eyes: PERRLA, extraocular membranes intact, sclera white    Nose: nasal turbinates moist, no nasal discharge    Throat: moist mucus membranes, no erythema, no tonsillar exudate.  Symmetric rise of palate noted.  Airway is patent Cardio: regular rate and rhythm, S1S2 heard, no murmurs appreciated Pulm: clear to auscultation bilaterally, no wheezes, rhonchi or rales; normal work of breathing on room air Extremities: warm, well perfused, No edema, cyanosis or clubbing; +2 pulses bilaterally MSK: normal gait and normal station Neuro: Cranial nerves II through XII grossly intact.  She has normal upper cerebellar testing.  Negative Romberg.  Assessment/ Plan: 84 y.o. female   1. Essential hypertension Blood pressure at goal.  No changes.  Will check BMP given elevation in creatinine at last visit. - Basic Metabolic  Panel  2. Age-related osteoporosis without current pathological fracture Treated with Fosamax.  From the records she has been on this medication for at least 1 year with [place].  She establish care in 2018 and it is difficult to tell as to how long exactly she is been on this medicine.  She thinks she is been  on it for many years.  If she is been on it for greater than 5 years, would recommend holding bisphosphonate and considering use of Prolia.  Will obtain bone density scan.  Patient to schedule this. - DG [facility] DEXA; Future  3. Balance problem No focal neurologic deficits on exam.  However, if no significant improvement with physical therapy or she develops neurologic symptoms, would recommend imaging given history of TIA.  Will refer to physical therapy and follow-up in the next 3 months. - Ambulatory referral to Physical Therapy   Orders Placed This Encounter  Procedures  . DG [facility] DEXA    Standing Status:   Future    Number of Occurrences:   1    Standing Expiration Date:   03/18/2019    Order Specific Question:   Reason for Exam (SYMPTOM  OR DIAGNOSIS REQUIRED)    Answer:   osteoporosis  . Basic Metabolic Panel  . Ambulatory referral to Physical Therapy    Referral Priority:   Routine    Referral Type:   Physical Medicine    Referral Reason:   Specialty Services Required    Requested Specialty:   Physical Therapy    Number of Visits Requested:   1   No orders of the defined types were placed in this encounter.    [name], DO [place] [phone]

## 2018-01-15 NOTE — Patient Instructions (Addendum)
You had labs performed today.  You will be contacted with the results of the labs once they are available, usually in the next 3 business days for routine lab work.   You have been referred to physical therapy for help with your balance.

## 2018-01-16 ENCOUNTER — Other Ambulatory Visit: Payer: Self-pay | Admitting: Family Medicine

## 2018-01-16 DIAGNOSIS — R7989 Other specified abnormal findings of blood chemistry: Secondary | ICD-10-CM

## 2018-01-16 LAB — BASIC METABOLIC PANEL
BUN / CREAT RATIO: 17 (ref 12–28)
BUN: 24 mg/dL (ref 8–27)
CHLORIDE: 103 mmol/L (ref 96–106)
CO2: 23 mmol/L (ref 20–29)
CREATININE: 1.4 mg/dL — AB (ref 0.57–1.00)
Calcium: 10.3 mg/dL (ref 8.7–10.3)
GFR calc Af Amer: 40 mL/min/{1.73_m2} — ABNORMAL LOW (ref >59)
GFR calc non Af Amer: 35 mL/min/{1.73_m2} — ABNORMAL LOW (ref >59)
GLUCOSE: 104 mg/dL — AB (ref 65–99)
POTASSIUM: 4.3 mmol/L (ref 3.5–5.2)
Sodium: 141 mmol/L (ref 134–144)

## 2018-01-17 ENCOUNTER — Other Ambulatory Visit: Payer: Self-pay

## 2018-01-17 MED ORDER — TELMISARTAN 80 MG PO TABS: 80.0000 mg | ORAL_TABLET | Freq: Every day | ORAL | 1 refills | Status: DC

## 2018-02-09 DIAGNOSIS — H04123 Dry eye syndrome of bilateral lacrimal glands: Secondary | ICD-10-CM | POA: Diagnosis not present

## 2018-02-14 ENCOUNTER — Telehealth: Payer: Self-pay | Admitting: *Deleted

## 2018-02-14 NOTE — Telephone Encounter (Signed)
Pt would like for Kristen to call her back

## 2018-02-14 NOTE — Telephone Encounter (Signed)
Yes given renal dysfunction and I believe she has been on bisphosphonate for several years (she is due for drug holiday), I would prefer her to use Prolia.  I will be happy to fill out any forms she may need to get financial assistance if that is the limiting factor.

## 2018-02-14 NOTE — Telephone Encounter (Signed)
    Insurance will not cover Evenity until she has tried and failed a bisphosphonate and Prolia. Summary of benefits for Prolia received. Patient would owe around $250. Would probably be eligible for [employer] that would likely cover that entire cost.   She is currently taking alendronate without any problems but her most recent labs show decreased kidney function. Would it be better for her to switch to Prolia because of kidney disease? She prefers to stay on alendronate if that is an option.

## 2018-02-15 ENCOUNTER — Telehealth: Payer: Self-pay | Admitting: Family Medicine

## 2018-02-15 NOTE — Telephone Encounter (Signed)
Spoke with pt and she has an appt with [facility]

## 2018-02-15 NOTE — Telephone Encounter (Signed)
Patient is calling back to speak with someone who called her yesterday to see if  she had her from her Kidney specialist. Patient just got a letter in the mail from them and calling the nurse back to let her know what the letter stated. Please advise.

## 2018-02-24 ENCOUNTER — Telehealth: Payer: Self-pay | Admitting: Family Medicine

## 2018-02-26 ENCOUNTER — Encounter: Payer: Self-pay | Admitting: Family Medicine

## 2018-02-26 ENCOUNTER — Ambulatory Visit (INDEPENDENT_AMBULATORY_CARE_PROVIDER_SITE_OTHER): Payer: Medicare HMO | Admitting: Family Medicine

## 2018-02-26 ENCOUNTER — Ambulatory Visit: Payer: Medicare HMO | Admitting: Family Medicine

## 2018-02-26 VITALS — BP 124/64 | HR 66 | Temp 97.0°F | Ht 59.0 in | Wt 133.0 lb

## 2018-02-26 DIAGNOSIS — I10 Essential (primary) hypertension: Secondary | ICD-10-CM

## 2018-02-26 DIAGNOSIS — H6593 Unspecified nonsuppurative otitis media, bilateral: Secondary | ICD-10-CM | POA: Diagnosis not present

## 2018-02-26 DIAGNOSIS — M25511 Pain in right shoulder: Secondary | ICD-10-CM

## 2018-02-26 NOTE — Progress Notes (Signed)
Subjective:    Patient ID: Amanda [name], female    DOB: 04-Nov-1930, 84 y.o.   MRN: [id number]  Chief Complaint:  BP elevated on Friday, some visual changes at the time, BP n (159/65 on Friday)   HPI: Amanda [name] is a 84 y.o. female presenting on 02/26/2018 for BP elevated on Friday, some visual changes at the time, BP n (159/65 on Friday)  Pt presents today for evaluation of elevated blood pressure and visual changes that occurred on Friday 02/23/18. Pt states she woke up Friday morning with blurred vision. She states she got up to go to the kitchen and felt as if the room was spinning. States she then developed pain in her right shoulder that radiated across the center of her chest and into her left shoulder. She reports this lasted for a brief period of time and completely resolved. Pt states she checked her blood pressure and it was 159/65 and her heart rated was 63. She denies any confusion, weakness, slurred speech, facial droop, or loss of function during this episode. She states she did have chills. She denies any repeat of these symptoms over the weekend and states her blood pressure this morning was 115/57 and her heart rate was 75. She denies chest pain, shortness of breath, diaphoresis, headache, or dizziness.   1. Essential hypertension   2. Acute pain of right shoulder   3. Bilateral otitis media with effusion      Relevant past medical, surgical, family and social history reviewed and updated as indicated. Interim medical history since our last visit reviewed. Allergies and medications reviewed and updated. DATA REVIEWED: CHART IN EPIC  Family History reviewed for pertinent findings.  Past Medical History:  Diagnosis Date  . Allergy   . Anxiety   . Coronary artery disease    LAD 30% followed by 60% stenosis; pressure wire measurement demonstrated no significant gradient; circumflex had 30% stenosis, right coronary artery has 40% stenosed; EF 75-80%  . Diverticulosis   .  GERD (gastroesophageal reflux disease)   . Hyperlipidemia   . Hypertension   . Stroke [facility]) 2003   With a left MCA occlusion  . TIA (transient ischemic attack)     Past Surgical History:  Procedure Laterality Date  . ABDOMINAL HYSTERECTOMY     partial and complete - x  surgeries  . APPENDECTOMY    . CHOLECYSTECTOMY    . EYE SURGERY Bilateral    cataracts  . Hysterectomy-type unspecified    . TONSILLECTOMY      Social History   Socioeconomic History  . Marital status: Widowed    Spouse name: Not on file  . Number of children: Not on file  . Years of education: Not on file  . Highest education level: Not on file  Occupational History  . 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 Financial resource strain: Not on file  . Food insecurity:    Worry: Not on file    Inability: Not on file  . Transportation needs:    Medical: Not on file    Non-medical: Not on file  Tobacco Use  . Smoking status: Former Smoker    Last attempt to quit: 06/06/1985    Years since quitting: 32.7  . Smokeless tobacco: Never Used  . Tobacco comment: Approximately 10-pack-year history  Substance and Sexual Activity  . Alcohol use: No  . Drug use: No  .  Sexual activity: Not on file  Lifestyle  . Physical activity:    Days per week: Not on file    Minutes per session: Not on file  . Stress: Not on file  Relationships  . Social connections:    Talks on phone: Not on file    Gets together: Not on file    Attends religious service: Not on file    Active member of club or organization: Not on file    Attends meetings of clubs or organizations: Not on file    Relationship status: Not on file  . Intimate partner violence:    Fear of current or ex partner: Not on file    Emotionally abused: Not on file    Physically abused: Not on file    Forced sexual activity: Not on file  Other Topics Concern  . Not on file  Social History Narrative   Lives in [place]  with her son.   Has been widowed for about 3 years.    Outpatient Encounter Medications as of 02/26/2018  Medication Sig  . alendronate (FOSAMAX) 70 MG tablet Take 1 tablet (70 mg total) by mouth once a week.  Marland Kitchen aspirin EC 81 MG tablet Take 81 mg by mouth daily.  Marland Kitchen atorvastatin (LIPITOR) 20 MG tablet Take 1 tablet (20 mg total) by mouth at bedtime.  . hydrochlorothiazide (HYDRODIURIL) 25 MG tablet Take 1 tablet (25 mg total) by mouth daily.  . Multiple Vitamins-Minerals (ICAPS AREDS 2) CAPS Take 2 capsules by mouth daily.  Marland Kitchen omeprazole (PRILOSEC) 20 MG capsule Take 1 capsule (20 mg total) by mouth daily.  Marland Kitchen telmisartan (MICARDIS) 80 MG tablet Take 1 tablet (80 mg total) by mouth daily.  . verapamil (CALAN-SR) 120 MG CR tablet Take 1 tablet (120 mg total) by mouth at bedtime.  . [DISCONTINUED] aspirin EC 325 MG EC tablet Take 1 tablet (325 mg total) by mouth daily. (Patient taking differently: Take 81 mg by mouth daily. )   No facility-administered encounter medications on file as of 02/26/2018.     Allergies  Allergen Reactions  . Livalo [Pitavastatin] Other (See Comments)    Causes dizziness  . Simvastatin Other (See Comments)    Causes dizziness  . Lisinopril     Hallucinations, resolved on ARB    Review of Systems  Constitutional: Positive for chills. Negative for diaphoresis, fatigue and fever.  HENT: Negative for ear discharge, ear pain, sinus pressure, sinus pain, sore throat and tinnitus.        Fullness in ears  Eyes: Positive for visual disturbance. Negative for photophobia.  Respiratory: Negative for cough, chest tightness and shortness of breath.   Cardiovascular: Positive for chest pain (One episode on Friday) and palpitations.  Gastrointestinal: Negative for abdominal pain, constipation, diarrhea, nausea and vomiting.  Musculoskeletal:       One episode of bilateral shoulder pain on Friday  Skin: Negative for color change and pallor.  Neurological: Positive for  dizziness. Negative for tremors, seizures, syncope, facial asymmetry, speech difficulty, weakness, light-headedness, numbness and headaches.  Psychiatric/Behavioral: Negative for confusion.  All other systems reviewed and are negative.       Objective:    BP 124/64   Pulse 66   Temp (!) 97 F (36.1 C) (Oral)   Ht 4\' 11"  (1.499 m)   Wt 133 lb (60.3 kg)   BMI 26.86 kg/m    Wt Readings from Last 3 Encounters:  02/26/18 133 lb (60.3 kg)  01/15/18 135 lb (  61.2 kg)  09/14/17 137 lb 6.4 oz (62.3 kg)    Physical Exam  Constitutional: She is oriented to person, place, and time. Vital signs are normal. She appears well-developed and well-nourished. She is cooperative. No distress.  HENT:  Head: Normocephalic and atraumatic.  Right Ear: Hearing, external ear and ear canal normal. Tympanic membrane is not injected, not perforated and not erythematous. A middle ear effusion is present.  Left Ear: Hearing, external ear and ear canal normal. Tympanic membrane is not injected, not perforated and not erythematous. A middle ear effusion is present.  Nose: Nose normal.  Mouth/Throat: Uvula is midline, oropharynx is clear and moist and mucous membranes are normal.  Eyes: Pupils are equal, round, and reactive to light. Conjunctivae and EOM are normal.  Neck: Trachea normal, normal range of motion and phonation normal. Neck supple. Normal carotid pulses and no JVD present. Carotid bruit is not present. No tracheal deviation present. No thyromegaly present.  Cardiovascular: Normal rate, regular rhythm, S1 normal, S2 normal, normal heart sounds and intact distal pulses. Exam reveals no gallop.  No murmur heard. Pulmonary/Chest: Effort normal and breath sounds normal. No respiratory distress.  Abdominal: Soft. Normal appearance and bowel sounds are normal.  Lymphadenopathy:    She has no cervical adenopathy.  Neurological: She is alert and oriented to person, place, and time. She has normal strength  and normal reflexes. No cranial nerve deficit or sensory deficit. She displays a negative Romberg sign. GCS eye subscore is 4. GCS verbal subscore is 5. GCS motor subscore is 6.  Skin: Skin is warm, dry and intact. Capillary refill takes less than 2 seconds.  Psychiatric: She has a normal mood and affect. Her speech is normal and behavior is normal. Judgment and thought content normal. Cognition and memory are normal.  Nursing note and vitals reviewed.   Results for orders placed or performed in visit on 25/00/37  Basic Metabolic Panel  Result Value Ref Range   Glucose 104 (H) 65 - 99 mg/dL   BUN 24 8 - 27 mg/dL   Creatinine, Ser 1.40 (H) 0.57 - 1.00 mg/dL   GFR calc non Af Amer 35 (L) >59 mL/min/1.73   GFR calc Af Amer 40 (L) >59 mL/min/1.73   BUN/Creatinine Ratio 17 12 - 28   Sodium 141 134 - 144 mmol/L   Potassium 4.3 3.5 - 5.2 mmol/L   Chloride 103 96 - 106 mmol/L   CO2 23 20 - 29 mmol/L   Calcium 10.3 8.7 - 10.3 mg/dL   EKG: SR with partial BBB, no acute changes, no ST elevation or ectopy. Compared to EKG from 04/10/2013, comparable findings. Monia Pouch, FNP-C    Assessment & Plan:   1. Essential hypertension Symptoms concerning for TIA. Continue to monitor BP at home and keep a log. Call 911 for return of symptoms and for any confusion, severe headache, unilateral weakness, slurred speech, loss of function, or syncope. Increase baby Aspirin to two 81 mg tablets per day.  -EKG, Future  2. Acute pain of right shoulder - EKG; Future  3. Bilateral otitis media with effusion Pt to take Claritin 10 mg daily. Pt states she will buy this over the counter.   Continue all other maintenance medications.  Follow up plan: Return in about 4 weeks (around 03/26/2018), or if symptoms worsen or fail to improve.  Educational handout given for Otitis media with effusion, hypertension  The above assessment and management plan was discussed with the patient. The patient verbalized  understanding of and has agreed to the management plan. Patient is aware to call the clinic if symptoms persist or worsen. Patient is aware when to return to the clinic for a follow-up visit. Patient educated on when it is appropriate to go to the emergency department.   Monia Pouch, FNP-C [facility] [phone]

## 2018-02-26 NOTE — Patient Instructions (Addendum)
Otitis Media With Effusion Otitis media with effusion (OME) occurs when there is inflammation of the middle ear and fluid in the middle ear space. There are no signs and symptoms of infection. The middle ear space contains air and the bones for hearing. Air in the middle ear space helps to transmit sound to the brain. OME is a common condition in children, and it often occurs after an ear infection. This condition may be present for several weeks or longer after an ear infection. Most cases of this condition get better on their own. What are the causes? OME is caused by a blockage of the eustachian tube in one or both ears. These tubes drain fluid in the ears to the back of the nose (nasopharynx). If the tissue in the tube swells up (edema), the tube closes. This prevents fluid from draining. Blockage can be caused by:  Ear infections.  Colds and other upper respiratory infections.  Allergies.  Irritants, such as tobacco smoke.  Enlarged adenoids. The adenoids are areas of soft tissue located high in the back of the throat, behind the nose and the roof of the mouth. They are part of the body's natural defense (immune) system.  A mass in the nasopharynx.  Damage to the ear caused by pressure changes (barotrauma).  What increases the risk? Your child is more likely to develop this condition if:  He or she has repeated ear and sinus infections.  He or she has allergies.  He or she is exposed to tobacco smoke.  He or she attends daycare.  He or she is not breastfed.  What are the signs or symptoms? Symptoms of this condition may not be obvious. Sometimes this condition does not have any symptoms, or symptoms may overlap with those of a cold or upper respiratory tract illness. Symptoms of this condition include:  Temporary hearing loss.  A feeling of fullness in the ear without pain.  Irritability or agitation.  Balance (vestibular) problems.  As a result of hearing loss, your  child may:  Listen to the TV at a loud volume.  Not respond to questions.  Ask "What?" often when spoken to.  Mistake or confuse one sound or word for another.  Perform poorly at school.  Have a poor attention span.  Become agitated or irritated easily.  How is this diagnosed? This condition is diagnosed with an ear exam. Your child's health care provider will look inside your child's ear with an instrument (otoscope) to check for redness, swelling, and fluid. Other tests may be done, including:  A test to check the movement of the eardrum (pneumatic otoscopy). This is done by squeezing a small amount of air into the ear.  A test that changes air pressure in the middle ear to check how well the eardrum moves and to see if the eustachian tube is working (tympanogram).  Hearing test (audiogram). This test involves playing tones at different pitches to see if your child can hear each tone.  How is this treated? Treatment for this condition depends on the cause. In many cases, the fluid goes away on its own. In some cases, your child may need a procedure to create a hole in the eardrum to allow fluid to drain (myringotomy) and to insert small drainage tubes (tympanostomy tubes) into the eardrums. These tubes help to drain fluid and prevent infection. This procedure may be recommended if:  OME does not get better over several months.  Your child has many ear infections  within several months.  Your child has noticeable hearing loss.  Your child has problems with speech and language development.  Surgery may also be done to remove the adenoids (adenoidectomy). Follow these instructions at home:  Give over-the-counter and prescription medicines only as told by your child's health care provider.  Keep children away from any tobacco smoke.  Keep all follow-up visits as told by your child's health care provider. This is important. How is this prevented?  Keep your child's  vaccinations up to date. Make sure your child gets all recommended vaccinations, including a pneumonia and flu vaccine.  Encourage hand washing. Your child should wash his or her hands often with soap and water. If there is no soap and water, he or she should use hand sanitizer.  Avoid exposing your child to tobacco smoke.  Breastfeed your baby, if possible. Babies who are breastfed as long as possible are less likely to develop this condition. Contact a health care provider if:  Your child's hearing does not get better after 3 months.  Your child's hearing is worse.  Your child has ear pain.  Your child has a fever.  Your child has drainage from the ear.  Your child is dizzy.  Your child has a lump on his or her neck. Get help right away if:  Your child has bleeding from the nose.  Your child cannot move part of her or his face.  Your child has trouble breathing.  Your child cannot smell.  Your child develops severe congestion.  Your child develops weakness.  Your child who is younger than 3 months has a temperature of 100F (38C) or higher. Summary  Otitis media with effusion (OME) occurs when there is inflammation of the middle ear and fluid in the middle ear space.  This condition is caused by blockage of one or both eustachian tubes, which drain fluid in the ears to the back of the nose.  Symptoms of this condition can include temporary hearing loss, a feeling of fullness in the ear, irritability or agitation, and balance (vestibular) problems. Sometimes, there are no symptoms.  This condition is diagnosed with an ear exam and tests, such as pneumatic otoscopy, tympanogram, and audiogram.  Treatment for this condition depends on the cause. In many cases, the fluid goes away on its own. This information is not intended to replace advice given to you by your health care provider. Make sure you discuss any questions you have with your health care provider. Document  Released: 08/13/2003 Document Revised: 04/14/2016 Document Reviewed: 04/14/2016 Elsevier Interactive Patient Education  2017 [employer]  Hypertension Hypertension is another name for high blood pressure. High blood pressure forces your heart to work harder to pump blood. This can cause problems over time. There are two numbers in a blood pressure reading. There is a top number (systolic) over a bottom number (diastolic). It is best to have a blood pressure below 120/80. Healthy choices can help lower your blood pressure. You may need medicine to help lower your blood pressure if:  Your blood pressure cannot be lowered with healthy choices.  Your blood pressure is higher than 130/80.  Follow these instructions at home: Eating and drinking  If directed, follow the DASH eating plan. This diet includes: ? Filling half of your plate at each meal with fruits and vegetables. ? Filling one quarter of your plate at each meal with whole grains. Whole grains include whole wheat pasta, brown rice, and whole grain bread. ? Eating  or drinking low-fat dairy products, such as skim milk or low-fat yogurt. ? Filling one quarter of your plate at each meal with low-fat (lean) proteins. Low-fat proteins include fish, skinless chicken, eggs, beans, and tofu. ? Avoiding fatty meat, cured and processed meat, or chicken with skin. ? Avoiding premade or processed food.  Eat less than 1,500 mg of salt (sodium) a day.  Limit alcohol use to no more than 1 drink a day for nonpregnant women and 2 drinks a day for men. One drink equals 12 oz of beer, 5 oz of wine, or 1 oz of hard liquor. Lifestyle  Work with your doctor to stay at a healthy weight or to lose weight. Ask your doctor what the best weight is for you.  Get at least 30 minutes of exercise that causes your heart to beat faster (aerobic exercise) most days of the week. This may include walking, swimming, or biking.  Get at least 30 minutes of exercise  that strengthens your muscles (resistance exercise) at least 3 days a week. This may include lifting weights or pilates.  Do not use any products that contain nicotine or tobacco. This includes cigarettes and e-cigarettes. If you need help quitting, ask your doctor.  Check your blood pressure at home as told by your doctor.  Keep all follow-up visits as told by your doctor. This is important. Medicines  Take over-the-counter and prescription medicines only as told by your doctor. Follow directions carefully.  Do not skip doses of blood pressure medicine. The medicine does not work as well if you skip doses. Skipping doses also puts you at risk for problems.  Ask your doctor about side effects or reactions to medicines that you should watch for. Contact a doctor if:  You think you are having a reaction to the medicine you are taking.  You have headaches that keep coming back (recurring).  You feel dizzy.  You have swelling in your ankles.  You have trouble with your vision. Get help right away if:  You get a very bad headache.  You start to feel confused.  You feel weak or numb.  You feel faint.  You get very bad pain in your: ? Chest. ? Belly (abdomen).  You throw up (vomit) more than once.  You have trouble breathing. Summary  Hypertension is another name for high blood pressure.  Making healthy choices can help lower blood pressure. If your blood pressure cannot be controlled with healthy choices, you may need to take medicine. This information is not intended to replace advice given to you by your health care provider. Make sure you discuss any questions you have with your health care provider. Document Released: 11/09/2007 Document Revised: 04/20/2016 Document Reviewed: 04/20/2016 Elsevier Interactive Patient Education  [employer].

## 2018-02-26 NOTE — Telephone Encounter (Signed)
LMTCB

## 2018-02-28 NOTE — Telephone Encounter (Signed)
Talked with patient. Advised that Dr Lajuana Ripple prefers Prolia over Fosamax because of her history of kidney failure. Patient would like more information before making a decision. I will mail her info on Prolia. She has a follow up appt at the end of October and will discuss this then.

## 2018-03-05 ENCOUNTER — Telehealth: Payer: Self-pay | Admitting: Family Medicine

## 2018-03-06 NOTE — Telephone Encounter (Signed)
Patient states that she thinks the pharmacy called her and not [place] and she will call them.

## 2018-03-12 NOTE — Telephone Encounter (Signed)
Patient seen for office visit

## 2018-03-23 DIAGNOSIS — N183 Chronic kidney disease, stage 3 (moderate): Secondary | ICD-10-CM | POA: Diagnosis not present

## 2018-03-26 ENCOUNTER — Encounter: Payer: Self-pay | Admitting: Family Medicine

## 2018-03-26 ENCOUNTER — Ambulatory Visit (INDEPENDENT_AMBULATORY_CARE_PROVIDER_SITE_OTHER): Payer: Medicare HMO | Admitting: Family Medicine

## 2018-03-26 VITALS — BP 124/68 | HR 68 | Temp 97.0°F | Ht 59.0 in | Wt 135.0 lb

## 2018-03-26 DIAGNOSIS — I10 Essential (primary) hypertension: Secondary | ICD-10-CM

## 2018-03-26 DIAGNOSIS — H6591 Unspecified nonsuppurative otitis media, right ear: Secondary | ICD-10-CM | POA: Diagnosis not present

## 2018-03-26 MED ORDER — LORATADINE 10 MG PO TABS: 10.0000 mg | ORAL_TABLET | Freq: Every day | ORAL | 11 refills | Status: DC

## 2018-03-26 MED ORDER — FLUTICASONE PROPIONATE 50 MCG/ACT NA SUSP: 2.0000 | Freq: Every day | NASAL | 6 refills | Status: DC

## 2018-03-26 NOTE — Patient Instructions (Signed)
Otitis Media With Effusion Otitis media with effusion (OME) occurs when there is inflammation of the middle ear and fluid in the middle ear space. There are no signs and symptoms of infection. The middle ear space contains air and the bones for hearing. Air in the middle ear space helps to transmit sound to the brain. OME is a common condition in children, and it often occurs after an ear infection. This condition may be present for several weeks or longer after an ear infection. Most cases of this condition get better on their own. What are the causes? OME is caused by a blockage of the eustachian tube in one or both ears. These tubes drain fluid in the ears to the back of the nose (nasopharynx). If the tissue in the tube swells up (edema), the tube closes. This prevents fluid from draining. Blockage can be caused by:  Ear infections.  Colds and other upper respiratory infections.  Allergies.  Irritants, such as tobacco smoke.  Enlarged adenoids. The adenoids are areas of soft tissue located high in the back of the throat, behind the nose and the roof of the mouth. They are part of the body's natural defense (immune) system.  A mass in the nasopharynx.  Damage to the ear caused by pressure changes (barotrauma).  What increases the risk? Your child is more likely to develop this condition if:  He or she has repeated ear and sinus infections.  He or she has allergies.  He or she is exposed to tobacco smoke.  He or she attends daycare.  He or she is not breastfed.  What are the signs or symptoms? Symptoms of this condition may not be obvious. Sometimes this condition does not have any symptoms, or symptoms may overlap with those of a cold or upper respiratory tract illness. Symptoms of this condition include:  Temporary hearing loss.  A feeling of fullness in the ear without pain.  Irritability or agitation.  Balance (vestibular) problems.  As a result of hearing loss, your  child may:  Listen to the TV at a loud volume.  Not respond to questions.  Ask "What?" often when spoken to.  Mistake or confuse one sound or word for another.  Perform poorly at school.  Have a poor attention span.  Become agitated or irritated easily.  How is this diagnosed? This condition is diagnosed with an ear exam. Your child's health care provider will look inside your child's ear with an instrument (otoscope) to check for redness, swelling, and fluid. Other tests may be done, including:  A test to check the movement of the eardrum (pneumatic otoscopy). This is done by squeezing a small amount of air into the ear.  A test that changes air pressure in the middle ear to check how well the eardrum moves and to see if the eustachian tube is working (tympanogram).  Hearing test (audiogram). This test involves playing tones at different pitches to see if your child can hear each tone.  How is this treated? Treatment for this condition depends on the cause. In many cases, the fluid goes away on its own. In some cases, your child may need a procedure to create a hole in the eardrum to allow fluid to drain (myringotomy) and to insert small drainage tubes (tympanostomy tubes) into the eardrums. These tubes help to drain fluid and prevent infection. This procedure may be recommended if:  OME does not get better over several months.  Your child has many ear infections   within several months.  Your child has noticeable hearing loss.  Your child has problems with speech and language development.  Surgery may also be done to remove the adenoids (adenoidectomy). Follow these instructions at home:  Give over-the-counter and prescription medicines only as told by your child's health care provider.  Keep children away from any tobacco smoke.  Keep all follow-up visits as told by your child's health care provider. This is important. How is this prevented?  Keep your child's  vaccinations up to date. Make sure your child gets all recommended vaccinations, including a pneumonia and flu vaccine.  Encourage hand washing. Your child should wash his or her hands often with soap and water. If there is no soap and water, he or she should use hand sanitizer.  Avoid exposing your child to tobacco smoke.  Breastfeed your baby, if possible. Babies who are breastfed as long as possible are less likely to develop this condition. Contact a health care provider if:  Your child's hearing does not get better after 3 months.  Your child's hearing is worse.  Your child has ear pain.  Your child has a fever.  Your child has drainage from the ear.  Your child is dizzy.  Your child has a lump on his or her neck. Get help right away if:  Your child has bleeding from the nose.  Your child cannot move part of her or his face.  Your child has trouble breathing.  Your child cannot smell.  Your child develops severe congestion.  Your child develops weakness.  Your child who is younger than 3 months has a temperature of 100F (38C) or higher. Summary  Otitis media with effusion (OME) occurs when there is inflammation of the middle ear and fluid in the middle ear space.  This condition is caused by blockage of one or both eustachian tubes, which drain fluid in the ears to the back of the nose.  Symptoms of this condition can include temporary hearing loss, a feeling of fullness in the ear, irritability or agitation, and balance (vestibular) problems. Sometimes, there are no symptoms.  This condition is diagnosed with an ear exam and tests, such as pneumatic otoscopy, tympanogram, and audiogram.  Treatment for this condition depends on the cause. In many cases, the fluid goes away on its own. This information is not intended to replace advice given to you by your health care provider. Make sure you discuss any questions you have with your health care provider. Document  Released: 08/13/2003 Document Revised: 04/14/2016 Document Reviewed: 04/14/2016 Elsevier Interactive Patient Education  2017 [employer]  

## 2018-03-26 NOTE — Progress Notes (Signed)
Subjective:    Patient ID: Amanda [name], female    DOB: 1930/10/14, 84 y.o.   MRN: [id number]  Chief Complaint:  Hypertension (4 week rck on BP. Dr Hinda Lenis stopped her HCTZ and questions if Fosamax is necessary considering she has elevated kidney functions)   HPI: Amanda [name] is a 84 y.o. female presenting on 03/26/2018 for Hypertension (4 week rck on BP. Dr Hinda Lenis stopped her HCTZ and questions if Fosamax is necessary considering she has elevated kidney functions)  Pt presents today for follow up from hypertension on 02/26/18. Pt states she has been doing well since the previous visit. States she still has dizziness when she gets out of the bed on some days. States this is not every day and it only lasts a short time and then subsides. Denies syncope, weakness, loss of function, confusion, or slurred speech during these dizzy episodes. States her HCTZ was stopped by her kidney doctor and has been tolerating this change well. Pt states he would also like her to stop her Fosamax. Her last T-Score was -2.7 on 01/15/18. Pt has osteopenia per results and does require treatment. She will follow up with her PCP to discuss this further. States her blood pressure readings have been good at home.   Relevant past medical, surgical, family, and social history reviewed and updated as indicated.  Allergies and medications reviewed and updated.   Past Medical History:  Diagnosis Date  . Allergy   . Anxiety   . Coronary artery disease    LAD 30% followed by 60% stenosis; pressure wire measurement demonstrated no significant gradient; circumflex had 30% stenosis, right coronary artery has 40% stenosed; EF 75-80%  . Diverticulosis   . GERD (gastroesophageal reflux disease)   . Hyperlipidemia   . Hypertension   . Stroke [facility]) 2003   With a left MCA occlusion  . TIA (transient ischemic attack)     Past Surgical History:  Procedure Laterality Date  . ABDOMINAL HYSTERECTOMY     partial and complete  - x  surgeries  . APPENDECTOMY    . CHOLECYSTECTOMY    . EYE SURGERY Bilateral    cataracts  . Hysterectomy-type unspecified    . TONSILLECTOMY      Social History   Socioeconomic History  . Marital status: Widowed    Spouse name: Not on file  . Number of children: Not on file  . Years of education: Not on file  . Highest education level: Not on file  Occupational History  . 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 Financial resource strain: Not on file  . Food insecurity:    Worry: Not on file    Inability: Not on file  . Transportation needs:    Medical: Not on file    Non-medical: Not on file  Tobacco Use  . Smoking status: Former Smoker    Last attempt to quit: 06/06/1985    Years since quitting: 32.8  . Smokeless tobacco: Never Used  . Tobacco comment: Approximately 10-pack-year history  Substance and Sexual Activity  . Alcohol use: No  . Drug use: No  . Sexual activity: Not on file  Lifestyle  . Physical activity:    Days per week: Not on file    Minutes per session: Not on file  . Stress: Not on file  Relationships  . Social connections:    Talks on phone: Not on file  Gets together: Not on file    Attends religious service: Not on file    Active member of club or organization: Not on file    Attends meetings of clubs or organizations: Not on file    Relationship status: Not on file  . Intimate partner violence:    Fear of current or ex partner: Not on file    Emotionally abused: Not on file    Physically abused: Not on file    Forced sexual activity: Not on file  Other Topics Concern  . Not on file  Social History Narrative   Lives in [place] with her son.   Has been widowed for about 3 years.    Outpatient Encounter Medications as of 03/26/2018  Medication Sig  . alendronate (FOSAMAX) 70 MG tablet Take 1 tablet (70 mg total) by mouth once a week.  Marland Kitchen aspirin EC 81 MG tablet Take 81 mg by mouth daily. Take 2  tablets daily  . atorvastatin (LIPITOR) 20 MG tablet Take 1 tablet (20 mg total) by mouth at bedtime.  . fluticasone (FLONASE) 50 MCG/ACT nasal spray Place 2 sprays into both nostrils daily.  Marland Kitchen loratadine (CLARITIN) 10 MG tablet Take 1 tablet (10 mg total) by mouth daily.  . Multiple Vitamins-Minerals (ICAPS AREDS 2) CAPS Take 2 capsules by mouth daily.  Marland Kitchen omeprazole (PRILOSEC) 20 MG capsule Take 1 capsule (20 mg total) by mouth daily.  Marland Kitchen telmisartan (MICARDIS) 80 MG tablet Take 1 tablet (80 mg total) by mouth daily.  . verapamil (CALAN-SR) 120 MG CR tablet Take 1 tablet (120 mg total) by mouth at bedtime.  . [DISCONTINUED] hydrochlorothiazide (HYDRODIURIL) 25 MG tablet Take 1 tablet (25 mg total) by mouth daily.   No facility-administered encounter medications on file as of 03/26/2018.     Allergies  Allergen Reactions  . Livalo [Pitavastatin] Other (See Comments)    Causes dizziness  . Simvastatin Other (See Comments)    Causes dizziness  . Lisinopril     Hallucinations, resolved on ARB    Review of Systems  Constitutional: Negative for chills, fatigue and fever.  HENT: Negative for congestion, sinus pressure and sinus pain.        Ear pressure  Eyes: Negative for photophobia and visual disturbance.  Respiratory: Negative for cough, chest tightness and shortness of breath.   Cardiovascular: Negative for chest pain, palpitations and leg swelling.  Gastrointestinal: Negative for abdominal pain, constipation, diarrhea, nausea and vomiting.  Musculoskeletal: Negative for gait problem.  Neurological: Positive for dizziness (intermittent with getting out of bed in morning ). Negative for tremors, seizures, syncope, facial asymmetry, speech difficulty, weakness, light-headedness, numbness and headaches.  Psychiatric/Behavioral: Negative for confusion.  All other systems reviewed and are negative.       Objective:    BP 124/68 (BP Location: Left Arm, Patient Position: Sitting, Cuff  Size: Normal)   Pulse 68   Temp (!) 97 F (36.1 C) (Oral)   Ht 4\' 11"  (1.499 m)   Wt 135 lb (61.2 kg)   BMI 27.27 kg/m    Wt Readings from Last 3 Encounters:  03/26/18 135 lb (61.2 kg)  02/26/18 133 lb (60.3 kg)  01/15/18 135 lb (61.2 kg)    Physical Exam  Constitutional: She is oriented to person, place, and time. She appears well-developed and well-nourished. She is cooperative.  HENT:  Head: Normocephalic and atraumatic.  Right Ear: Hearing, external ear and ear canal normal. A middle ear effusion is present.  Left Ear:  Hearing, tympanic membrane, external ear and ear canal normal.  Mouth/Throat: Uvula is midline, oropharynx is clear and moist and mucous membranes are normal.  Eyes: Pupils are equal, round, and reactive to light. Conjunctivae, EOM and lids are normal.  Neck: Trachea normal, normal range of motion, full passive range of motion without pain and phonation normal. Neck supple. No JVD present. Carotid bruit is not present. No thyroid mass and no thyromegaly present.  Cardiovascular: Normal rate, regular rhythm, S1 normal, S2 normal, normal heart sounds and intact distal pulses. Exam reveals no gallop and no friction rub.  No murmur heard. Pulmonary/Chest: Effort normal and breath sounds normal. No respiratory distress.  Lymphadenopathy:    She has no cervical adenopathy.  Neurological: She is alert and oriented to person, place, and time. She has normal strength and normal reflexes. No cranial nerve deficit or sensory deficit. She displays a negative Romberg sign.  Skin: Skin is warm and dry. Capillary refill takes less than 2 seconds.  Psychiatric: She has a normal mood and affect. Her speech is normal and behavior is normal. Judgment and thought content normal. Cognition and memory are normal.  Nursing note and vitals reviewed.   Results for orders placed or performed in visit on 56/38/93  Basic Metabolic Panel  Result Value Ref Range   Glucose 104 (H) 65 - 99  mg/dL   BUN 24 8 - 27 mg/dL   Creatinine, Ser 1.40 (H) 0.57 - 1.00 mg/dL   GFR calc non Af Amer 35 (L) >59 mL/min/1.73   GFR calc Af Amer 40 (L) >59 mL/min/1.73   BUN/Creatinine Ratio 17 12 - 28   Sodium 141 134 - 144 mmol/L   Potassium 4.3 3.5 - 5.2 mmol/L   Chloride 103 96 - 106 mmol/L   CO2 23 20 - 29 mmol/L   Calcium 10.3 8.7 - 10.3 mg/dL       Pertinent labs & imaging results that were available during my care of the patient were reviewed by me and considered in my medical decision making.  Assessment & Plan:  Amanda [name] was seen today for hypertension.  Diagnoses and all orders for this visit:  Right otitis media with effusion Avoid cigarette smoke. Medications as prescribed.  -     fluticasone (FLONASE) 50 MCG/ACT nasal spray; Place 2 sprays into both nostrils daily. -     loratadine (CLARITIN) 10 MG tablet; Take 1 tablet (10 mg total) by mouth daily.  Essential hypertension Dr. Lowanda Foster discontinued HCTZ. Pt to monitor BP at home and keep a record. Follow up with PCP in 2 weeks for reevaluation of BP and to discuss discontinuing Fosamax.      Continue all other maintenance medications.  Follow up plan: Return in about 2 weeks (around 04/09/2018), or if symptoms worsen or fail to improve.  Educational handout given for otitis media with effusion  The above assessment and management plan was discussed with the patient. The patient verbalized understanding of and has agreed to the management plan. Patient is aware to call the clinic if symptoms persist or worsen. Patient is aware when to return to the clinic for a follow-up visit. Patient educated on when it is appropriate to go to the emergency department.   Monia Pouch, FNP-C [facility] [phone]

## 2018-03-27 ENCOUNTER — Other Ambulatory Visit: Payer: Medicare HMO

## 2018-03-27 DIAGNOSIS — R809 Proteinuria, unspecified: Secondary | ICD-10-CM | POA: Diagnosis not present

## 2018-03-27 DIAGNOSIS — D509 Iron deficiency anemia, unspecified: Secondary | ICD-10-CM | POA: Diagnosis not present

## 2018-03-27 DIAGNOSIS — D519 Vitamin B12 deficiency anemia, unspecified: Secondary | ICD-10-CM | POA: Diagnosis not present

## 2018-03-27 DIAGNOSIS — Z79899 Other long term (current) drug therapy: Secondary | ICD-10-CM | POA: Diagnosis not present

## 2018-03-27 DIAGNOSIS — Z1159 Encounter for screening for other viral diseases: Secondary | ICD-10-CM | POA: Diagnosis not present

## 2018-03-27 DIAGNOSIS — E559 Vitamin D deficiency, unspecified: Secondary | ICD-10-CM | POA: Diagnosis not present

## 2018-03-27 DIAGNOSIS — I10 Essential (primary) hypertension: Secondary | ICD-10-CM | POA: Diagnosis not present

## 2018-03-27 DIAGNOSIS — N183 Chronic kidney disease, stage 3 (moderate): Secondary | ICD-10-CM | POA: Diagnosis not present

## 2018-03-29 ENCOUNTER — Other Ambulatory Visit: Payer: Medicare HMO

## 2018-04-03 ENCOUNTER — Other Ambulatory Visit: Payer: Self-pay

## 2018-04-03 MED ORDER — TELMISARTAN 80 MG PO TABS: 80.0000 mg | ORAL_TABLET | Freq: Every day | ORAL | 1 refills | Status: DC

## 2018-04-03 MED ORDER — VERAPAMIL HCL ER 120 MG PO TBCR: 120.0000 mg | EXTENDED_RELEASE_TABLET | Freq: Every day | ORAL | 1 refills | Status: DC

## 2018-04-04 ENCOUNTER — Other Ambulatory Visit: Payer: Self-pay | Admitting: *Deleted

## 2018-04-04 MED ORDER — ATORVASTATIN CALCIUM 20 MG PO TABS: 20.0000 mg | ORAL_TABLET | Freq: Every day | ORAL | 0 refills | Status: DC

## 2018-04-04 MED ORDER — OMEPRAZOLE 20 MG PO CPDR: 20.0000 mg | DELAYED_RELEASE_CAPSULE | Freq: Every day | ORAL | 0 refills | Status: DC

## 2018-04-06 ENCOUNTER — Other Ambulatory Visit (HOSPITAL_COMMUNITY): Payer: Self-pay | Admitting: Nephrology

## 2018-04-06 DIAGNOSIS — N183 Chronic kidney disease, stage 3 unspecified: Secondary | ICD-10-CM

## 2018-04-09 ENCOUNTER — Other Ambulatory Visit (HOSPITAL_COMMUNITY): Payer: Self-pay | Admitting: Nephrology

## 2018-04-09 DIAGNOSIS — I10 Essential (primary) hypertension: Secondary | ICD-10-CM

## 2018-04-13 ENCOUNTER — Ambulatory Visit (HOSPITAL_COMMUNITY)
Admission: RE | Admit: 2018-04-13 | Discharge: 2018-04-13 | Disposition: A | Discharge status: Home / Self Care | Payer: Medicare HMO | Source: Ambulatory Visit | Attending: Nephrology | Admitting: Nephrology

## 2018-04-13 ENCOUNTER — Ambulatory Visit (HOSPITAL_COMMUNITY): Payer: Self-pay

## 2018-04-13 DIAGNOSIS — I10 Essential (primary) hypertension: Secondary | ICD-10-CM | POA: Diagnosis not present

## 2018-04-13 DIAGNOSIS — N183 Chronic kidney disease, stage 3 unspecified: Secondary | ICD-10-CM

## 2018-04-13 DIAGNOSIS — N189 Chronic kidney disease, unspecified: Secondary | ICD-10-CM | POA: Diagnosis not present

## 2018-04-13 DIAGNOSIS — I129 Hypertensive chronic kidney disease with stage 1 through stage 4 chronic kidney disease, or unspecified chronic kidney disease: Secondary | ICD-10-CM | POA: Diagnosis not present

## 2018-04-13 NOTE — Progress Notes (Signed)
*  PRELIMINARY RESULTS* Echocardiogram 2D Echocardiogram has been performed.  Amanda [name] 04/13/2018, 12:02 PM

## 2018-04-19 DIAGNOSIS — N183 Chronic kidney disease, stage 3 (moderate): Secondary | ICD-10-CM | POA: Diagnosis not present

## 2018-04-19 DIAGNOSIS — D638 Anemia in other chronic diseases classified elsewhere: Secondary | ICD-10-CM | POA: Diagnosis not present

## 2018-04-19 DIAGNOSIS — I509 Heart failure, unspecified: Secondary | ICD-10-CM | POA: Diagnosis not present

## 2018-04-23 ENCOUNTER — Encounter: Payer: Self-pay | Admitting: Family Medicine

## 2018-04-23 ENCOUNTER — Ambulatory Visit (INDEPENDENT_AMBULATORY_CARE_PROVIDER_SITE_OTHER): Payer: Medicare HMO | Admitting: Family Medicine

## 2018-04-23 VITALS — BP 140/71 | HR 72 | Temp 97.2°F | Wt 140.0 lb

## 2018-04-23 DIAGNOSIS — I10 Essential (primary) hypertension: Secondary | ICD-10-CM | POA: Diagnosis not present

## 2018-04-23 DIAGNOSIS — R42 Dizziness and giddiness: Secondary | ICD-10-CM | POA: Insufficient documentation

## 2018-04-23 DIAGNOSIS — N289 Disorder of kidney and ureter, unspecified: Secondary | ICD-10-CM

## 2018-04-23 DIAGNOSIS — M810 Age-related osteoporosis without current pathological fracture: Secondary | ICD-10-CM

## 2018-04-23 DIAGNOSIS — Z23 Encounter for immunization: Secondary | ICD-10-CM

## 2018-04-23 HISTORY — DX: Disorder of kidney and ureter, unspecified: N28.9

## 2018-04-23 NOTE — Progress Notes (Signed)
Subjective: CC: Osteoporosis PCP: [name], DO Amanda [name] is a 84 y.o. female presenting to clinic today for:  1. Osteoporosis/ HTN/ Impaired renal function/ dizziness Patient noted to have a T score of -2.7 on last DEXA scan, despite use of Fosamax.  Is my recommendation to proceed with Prolia at that time.  Patient notes today that she is afraid to use Prolia and that her kidney specialist told her not to use anything for her bones.  She would like to discuss this further with him prior to starting it.  She does report a fall since her last visit, citing that she became dizzy when she got up from bed to go to the bathroom.  She notes this happens often when she gets up in the morning and occasionally when she changes positions during the day.  She notes that he took her off of her hydrochlorothiazide as well.  She reports compliance with her other blood pressure medication.  She saw Monia Pouch here in office for the inner ear issue recently, who recommended starting Claritin.  She is taking this currently and states that does help with some of the sinuses.  No chest pain, shortness of breath, lower extremity edema.   ROS: Per HPI  Allergies  Allergen Reactions  . Livalo [Pitavastatin] Other (See Comments)    Causes dizziness  . Simvastatin Other (See Comments)    Causes dizziness  . Lisinopril     Hallucinations, resolved on ARB   Past Medical History:  Diagnosis Date  . Allergy   . Anxiety   . Coronary artery disease    LAD 30% followed by 60% stenosis; pressure wire measurement demonstrated no significant gradient; circumflex had 30% stenosis, right coronary artery has 40% stenosed; EF 75-80%  . Diverticulosis   . GERD (gastroesophageal reflux disease)   . Hyperlipidemia   . Hypertension   . Stroke [facility]) 2003   With a left MCA occlusion  . TIA (transient ischemic attack)     Current Outpatient Medications:  .  aspirin EC 81 MG tablet, Take 81 mg by mouth  daily. Take 2 tablets daily, Disp: , Rfl:  .  atorvastatin (LIPITOR) 20 MG tablet, Take 1 tablet (20 mg total) by mouth at bedtime., Disp: 90 tablet, Rfl: 0 .  fluticasone (FLONASE) 50 MCG/ACT nasal spray, Place 2 sprays into both nostrils daily., Disp: 16 g, Rfl: 6 .  loratadine (CLARITIN) 10 MG tablet, Take 1 tablet (10 mg total) by mouth daily., Disp: 30 tablet, Rfl: 11 .  Multiple Vitamins-Minerals (ICAPS AREDS 2) CAPS, Take 2 capsules by mouth daily., Disp: , Rfl:  .  omeprazole (PRILOSEC) 20 MG capsule, Take 1 capsule (20 mg total) by mouth daily., Disp: 90 capsule, Rfl: 0 .  telmisartan (MICARDIS) 80 MG tablet, Take 1 tablet (80 mg total) by mouth daily., Disp: 90 tablet, Rfl: 1 .  verapamil (CALAN-SR) 120 MG CR tablet, Take 1 tablet (120 mg total) by mouth at bedtime., Disp: 90 tablet, Rfl: 1 .  alendronate (FOSAMAX) 70 MG tablet, Take 1 tablet (70 mg total) by mouth once a week. (Patient not taking: Reported on 04/23/2018), Disp: 12 tablet, Rfl: 3 Social History   Socioeconomic History  . Marital status: Widowed    Spouse name: Not on file  . Number of children: Not on file  . Years of education: Not on file  . Highest education level: Not on file  Occupational History  . Occupation: [employer]  Employer: RETIRED    Comment: Retired  Scientific laboratory technician  . Financial resource strain: Not on file  . Food insecurity:    Worry: Not on file    Inability: Not on file  . Transportation needs:    Medical: Not on file    Non-medical: Not on file  Tobacco Use  . Smoking status: Former Smoker    Last attempt to quit: 06/06/1985    Years since quitting: 32.9  . Smokeless tobacco: Never Used  . Tobacco comment: Approximately 10-pack-year history  Substance and Sexual Activity  . Alcohol use: No  . Drug use: No  . Sexual activity: Not on file  Lifestyle  . Physical activity:    Days per week: Not on file    Minutes per session: Not on file  . Stress: Not on file  Relationships  . Social  connections:    Talks on phone: Not on file    Gets together: Not on file    Attends religious service: Not on file    Active member of club or organization: Not on file    Attends meetings of clubs or organizations: Not on file    Relationship status: Not on file  . Intimate partner violence:    Fear of current or ex partner: Not on file    Emotionally abused: Not on file    Physically abused: Not on file    Forced sexual activity: Not on file  Other Topics Concern  . Not on file  Social History Narrative   Lives in [place] with her son.   Has been widowed for about 3 years.   Family History  Problem Relation Age of Onset  . Cancer Mother        colonoscopy   . Cancer Father        unsure if cancer  --- tumor on brain  . Heart attack Sister   . Heart attack Brother   . Diabetes Brother   . GI problems Son   . COPD Sister   . Hypertension Brother   . Heart disease Brother   . Heart attack Brother   . Heart disease Brother   . Alcohol abuse Brother     Objective: Office vital signs reviewed. BP 140/71   Pulse 72   Temp (!) 97.2 F (36.2 C)   Wt 140 lb (63.5 kg)   BMI 28.28 kg/m   Physical Examination:  General: Awake, alert, well nourished, No acute distress HEENT: Normal; sclera white, MMM Cardio: regular rate and rhythm, S1S2 heard, no murmurs appreciated Pulm: clear to auscultation bilaterally, no wheezes, rhonchi or rales; normal work of breathing on room air Extremities: warm, well perfused, trace pedal edema. No cyanosis or clubbing; +2 pulses bilaterally MSK: normal gait/ station  Assessment/ Plan: 84 y.o. female   1. Age-related osteoporosis without current pathological fracture We discussed the recommendation to start Prolia.  I will also reach out to her nephrologist to see what the concern was regarding medication.  I suspect that the recommendation was discontinued the bisphosphonates.  Pearly is typically well tolerated in the renal impairment.   We will recheck the patient again once I have spoken to Dr. Maryruth Eve.  In the interim, I have given her a handout with regards to adequate vitamin D and calcium intake and weightbearing exercises.  2. Essential hypertension Controlled during today's visit.  Tolerating discontinuation of hydrochlorothiazide.  3. Postural dizziness I suspect that her symptoms are related to orthostatic hypotension.  She also has a history of balance issue in the past (hx CVA) and was recommended to physical therapy.  We discussed changing positions slowly, hydrating well on these of compression hose.  Handout was provided today.  4. Impaired renal function Med list updated to reflect recommendations by nephrologist.   [name], [place] [phone]

## 2018-04-23 NOTE — Patient Instructions (Signed)
As we discussed, I think that your dizziness is related to something called orthostatic hypotension.  This is when your blood pressure drops related to you changing positions.  You can consider obtaining compression hose from the pharmacy to help reduce this during the day.  Making sure that you stay hydrated and change positions slowly will also help.  As we discussed, I do recommend Prolia, particularly given your kidney function.  I will reach out to your kidney specialist so that he can discuss this with you further as well.  Orthostatic Hypotension Orthostatic hypotension is a sudden drop in blood pressure that happens when you quickly change positions, such as when you get up from a seated or lying position. Blood pressure is a measurement of how strongly, or weakly, your blood is pressing against the walls of your arteries. Arteries are blood vessels that carry blood from your heart throughout your body. When blood pressure is too low, you may not get enough blood to your brain or to the rest of your organs. This can cause weakness, light-headedness, rapid heartbeat, and fainting. This can last for just a few seconds or for up to a few minutes. Orthostatic hypotension is usually not a serious problem. However, if it happens frequently or gets worse, it may be a sign of something more serious. What are the causes? This condition may be caused by:  Sudden changes in posture, such as standing up quickly after you have been sitting or lying down.  Blood loss.  Loss of body fluids (dehydration).  Heart problems.  Hormone (endocrine) problems.  Pregnancy.  Severe infection.  Lack of certain nutrients.  Severe allergic reactions (anaphylaxis).  Certain medicines, such as blood pressure medicine or medicines that make the body lose excess fluids (diuretics). Sometimes, this condition can be caused by not taking medicine as directed, such as taking too much of a certain medicine.  What  increases the risk? Certain factors can make you more likely to develop orthostatic hypotension, including:  Age. Risk increases as you get older.  Conditions that affect the heart or the central nervous system.  Taking certain medicines, such as blood pressure medicine or diuretics.  Being pregnant.  What are the signs or symptoms? Symptoms of this condition may include:  Weakness.  Light-headedness.  Dizziness.  Blurred vision.  Fatigue.  Rapid heartbeat.  Fainting, in severe cases.  How is this diagnosed? This condition is diagnosed based on:  Your medical history.  Your symptoms.  Your blood pressure measurement. Your health care provider will check your blood pressure when you are: ? Lying down. ? Sitting. ? Standing.  A blood pressure reading is recorded as two numbers, such as "120 over 80" (or 120/80). The first ("top") number is called the systolic pressure. It is a measure of the pressure in your arteries as your heart beats. The second ("bottom") number is called the diastolic pressure. It is a measure of the pressure in your arteries when your heart relaxes between beats. Blood pressure is measured in a unit called mm Hg. Healthy blood pressure for adults is 120/80. If your blood pressure is below 90/60, you may be diagnosed with hypotension. Other information or tests that may be used to diagnose orthostatic hypotension include:  Your other vital signs, such as your heart rate and temperature.  Blood tests.  Tilt table test. For this test, you will be safely secured to a table that moves you from a lying position to an upright position. Your  heart rhythm and blood pressure will be monitored during the test.  How is this treated? Treatment for this condition may include:  Changing your diet. This may involve eating more salt (sodium) or drinking more water.  Taking medicines to raise your blood pressure.  Changing the dosage of certain medicines you  are taking that might be lowering your blood pressure.  Wearing compression stockings. These stockings help to prevent blood clots and reduce swelling in your legs.  In some cases, you may need to go to the hospital for:  Fluid replacement. This means you will receive fluids through an IV tube.  Blood replacement. This means you will receive donated blood through an IV tube (transfusion).  Treating an infection or heart problems, if this applies.  Monitoring. You may need to be monitored while medicines that you are taking wear off.  Follow these instructions at home: Eating and drinking   Drink enough fluid to keep your urine clear or pale yellow.  Eat a healthy diet and follow instructions from your health care provider about eating or drinking restrictions. A healthy diet includes: ? Fresh fruits and vegetables. ? Whole grains. ? Lean meats. ? Low-fat dairy products.  Eat extra salt only as directed. Do not add extra salt to your diet unless your health care provider told you to do that.  Eat frequent, small meals.  Avoid standing up suddenly after eating. Medicines  Take over-the-counter and prescription medicines only as told by your health care provider. ? Follow instructions from your health care provider about changing the dosage of your current medicines, if this applies. ? Do not stop or adjust any of your medicines on your own. General instructions  Wear compression stockings as told by your health care provider.  Get up slowly from lying down or sitting positions. This gives your blood pressure a chance to adjust.  Avoid hot showers and excessive heat as directed by your health care provider.  Return to your normal activities as told by your health care provider. Ask your health care provider what activities are safe for you.  Do not use any products that contain nicotine or tobacco, such as cigarettes and e-cigarettes. If you need help quitting, ask your  health care provider.  Keep all follow-up visits as told by your health care provider. This is important. Contact a health care provider if:  You vomit.  You have diarrhea.  You have a fever for more than 2-3 days.  You feel more thirsty than usual.  You feel weak and tired. Get help right away if:  You have chest pain.  You have a fast or irregular heartbeat.  You develop numbness in any part of your body.  You cannot move your arms or your legs.  You have trouble speaking.  You become sweaty or feel lightheaded.  You faint.  You feel short of breath.  You have trouble staying awake.  You feel confused. This information is not intended to replace advice given to you by your health care provider. Make sure you discuss any questions you have with your health care provider. Document Released: 05/13/2002 Document Revised: 02/09/2016 Document Reviewed: 11/13/2015 Elsevier Interactive Patient Education  2018 [employer].

## 2018-05-28 ENCOUNTER — Encounter: Payer: Self-pay | Admitting: Family Medicine

## 2018-05-28 ENCOUNTER — Ambulatory Visit (INDEPENDENT_AMBULATORY_CARE_PROVIDER_SITE_OTHER): Payer: Medicare HMO | Admitting: Family Medicine

## 2018-05-28 VITALS — BP 134/77 | HR 86 | Temp 97.3°F | Ht 59.0 in | Wt 137.0 lb

## 2018-05-28 DIAGNOSIS — M5442 Lumbago with sciatica, left side: Secondary | ICD-10-CM

## 2018-05-28 MED ORDER — METHYLPREDNISOLONE ACETATE 80 MG/ML IJ SUSP
80.0000 mg | Freq: Once | INTRAMUSCULAR | Status: AC
  Administered 2018-05-28: 80 mg via INTRAMUSCULAR

## 2018-05-28 MED ORDER — LIDOCAINE 5 % EX PTCH: 1.0000 | MEDICATED_PATCH | CUTANEOUS | 0 refills | Status: DC

## 2018-05-28 NOTE — Patient Instructions (Addendum)
Biofreeze or Thermacare topical  Sciatica  Sciatica is pain, numbness, weakness, or tingling along your sciatic nerve. The sciatic nerve starts in the lower back and goes down the back of each leg. Sciatica happens when this nerve is pinched or has pressure put on it. Sciatica usually goes away on its own or with treatment. Sometimes, sciatica may keep coming back (recur). Follow these instructions at home: Medicines  Take over-the-counter and prescription medicines only as told by your doctor.  Do not drive or use heavy machinery while taking prescription pain medicine. Managing pain  If directed, put ice on the affected area. ? Put ice in a plastic bag. ? Place a towel between your skin and the bag. ? Leave the ice on for 20 minutes, 2-3 times a day.  After icing, apply heat to the affected area before you exercise or as often as told by your doctor. Use the heat source that your doctor tells you to use, such as a moist heat pack or a heating pad. ? Place a towel between your skin and the heat source. ? Leave the heat on for 20-30 minutes. ? Remove the heat if your skin turns bright red. This is especially important if you are unable to feel pain, heat, or cold. You may have a greater risk of getting burned. Activity  Return to your normal activities as told by your doctor. Ask your doctor what activities are safe for you. ? Avoid activities that make your sciatica worse.  Take short rests during the day. Rest in a lying or standing position. This is usually better than sitting to rest. ? When you rest for a long time, do some physical activity or stretching between periods of rest. ? Avoid sitting for a long time without moving. Get up and move around at least one time each hour.  Exercise and stretch regularly, as told by your doctor.  Do not lift anything that is heavier than 10 lb (4.5 kg) while you have symptoms of sciatica. ? Avoid lifting heavy things even when you do not  have symptoms. ? Avoid lifting heavy things over and over.  When you lift objects, always lift in a way that is safe for your body. To do this, you should: ? Bend your knees. ? Keep the object close to your body. ? Avoid twisting. General instructions  Use good posture. ? Avoid leaning forward when you are sitting. ? Avoid hunching over when you are standing.  Stay at a healthy weight.  Wear comfortable shoes that support your feet. Avoid wearing high heels.  Avoid sleeping on a mattress that is too soft or too hard. You might have less pain if you sleep on a mattress that is firm enough to support your back.  Keep all follow-up visits as told by your doctor. This is important. Contact a doctor if:  You have pain that: ? Wakes you up when you are sleeping. ? Gets worse when you lie down. ? Is worse than the pain you have had in the past. ? Lasts longer than 4 weeks.  You lose weight for without trying. Get help right away if:  You cannot control when you pee (urinate) or poop (have a bowel movement).  You have weakness in any of these areas and it gets worse. ? Lower back. ? Lower belly (pelvis). ? Butt (buttocks). ? Legs.  You have redness or swelling of your back.  You have a burning feeling when you pee. This  information is not intended to replace advice given to you by your health care provider. Make sure you discuss any questions you have with your health care provider. Document Released: 03/01/2008 Document Revised: 10/29/2015 Document Reviewed: 01/30/2015 Elsevier Interactive Patient Education  2019 [employer] Acute Back Pain, Adult Acute back pain is sudden and usually short-lived. It is often caused by an injury to the muscles and tissues in the back. The injury may result from:  A muscle or ligament getting overstretched or torn (strained). Ligaments are tissues that connect bones to each other. Lifting something improperly can cause a back  strain.  Wear and tear (degeneration) of the spinal disks. Spinal disks are circular tissue that provides cushioning between the bones of the spine (vertebrae).  Twisting motions, such as while playing sports or doing yard work.  A hit to the back.  Arthritis. You may have a physical exam, lab tests, and imaging tests to find the cause of your pain. Acute back pain usually goes away with rest and home care. Follow these instructions at home: Managing pain, stiffness, and swelling  Take over-the-counter and prescription medicines only as told by your health care provider.  Your health care provider may recommend applying ice during the first 24-48 hours after your pain starts. To do this: ? Put ice in a plastic bag. ? Place a towel between your skin and the bag. ? Leave the ice on for 20 minutes, 2-3 times a day.  If directed, apply heat to the affected area as often as told by your health care provider. Use the heat source that your health care provider recommends, such as a moist heat pack or a heating pad. ? Place a towel between your skin and the heat source. ? Leave the heat on for 20-30 minutes. ? Remove the heat if your skin turns bright red. This is especially important if you are unable to feel pain, heat, or cold. You have a greater risk of getting burned. Activity   Do not stay in bed. Staying in bed for more than 1-2 days can delay your recovery.  Sit up and stand up straight. Avoid leaning forward when you sit, or hunching over when you stand. ? If you work at a desk, sit close to it so you do not need to lean over. Keep your chin tucked in. Keep your neck drawn back, and keep your elbows bent at a right angle. Your arms should look like the letter "L." ? Sit high and close to the steering wheel when you drive. Add lower back (lumbar) support to your car seat, if needed.  Take short walks on even surfaces as soon as you are able. Try to increase the length of time you  walk each day.  Do not sit, drive, or stand in one place for more than 30 minutes at a time. Sitting or standing for long periods of time can put stress on your back.  Do not drive or use heavy machinery while taking prescription pain medicine.  Use proper lifting techniques. When you bend and lift, use positions that put less stress on your back: ? [place] your knees. ? Keep the load close to your body. ? Avoid twisting.  Exercise regularly as told by your health care provider. Exercising helps your back heal faster and helps prevent back injuries by keeping muscles strong and flexible.  Work with a physical therapist to make a safe exercise program, as recommended by your health care provider. Do  any exercises as told by your physical therapist. Lifestyle  Maintain a healthy weight. Extra weight puts stress on your back and makes it difficult to have good posture.  Avoid activities or situations that make you feel anxious or stressed. Stress and anxiety increase muscle tension and can make back pain worse. Learn ways to manage anxiety and stress, such as through exercise. General instructions  Sleep on a firm mattress in a comfortable position. Try lying on your side with your knees slightly bent. If you lie on your back, put a pillow under your knees.  Follow your treatment plan as told by your health care provider. This may include: ? Cognitive or behavioral therapy. ? Acupuncture or massage therapy. ? Meditation or yoga. Contact a health care provider if:  You have pain that is not relieved with rest or medicine.  You have increasing pain going down into your legs or buttocks.  Your pain does not improve after 2 weeks.  You have pain at night.  You lose weight without trying.  You have a fever or chills. Get help right away if:  You develop new bowel or bladder control problems.  You have unusual weakness or numbness in your arms or legs.  You develop nausea or  vomiting.  You develop abdominal pain.  You feel faint. Summary  Acute back pain is sudden and usually short-lived.  Use proper lifting techniques. When you bend and lift, use positions that put less stress on your back.  Take over-the-counter and prescription medicines and apply heat or ice as directed by your health care provider. This information is not intended to replace advice given to you by your health care provider. Make sure you discuss any questions you have with your health care provider. Document Released: 05/23/2005 Document Revised: 12/28/2017 Document Reviewed: 01/04/2017 Elsevier Interactive Patient Education  2019 [employer].

## 2018-05-28 NOTE — Progress Notes (Signed)
Subjective:    Patient ID: Amanda [name], female    DOB: 07-19-1930, 84 y.o.   MRN: [id number]  Chief Complaint:  Lower back pain radiating into left leg   HPI: Amanda [name] is a 84 y.o. female presenting on 05/28/2018 for Lower back pain radiating into left leg  Pt presents today with complaints of lower back pain that is radiating into her left leg. This is a new problem. The current episode started around 8 days ago. The problem occurs daily. The problem has been waxing and waning. The quality of the pain is described as aching and throbbing with shooting pains into left leg. The pain is at a severity of 6/10. The pain is worse with certain movements and activities. Associated symptoms include left leg and hip pain. Pt denies fever, chills, weakness, loss of function, bowel or bladder incontinence, or saddle anesthesia. Pt has tried nothing for the symptoms.    Relevant past medical, surgical, family, and social history reviewed and updated as indicated.  Allergies and medications reviewed and updated.   Past Medical History:  Diagnosis Date  . Allergy   . Anxiety   . Coronary artery disease    LAD 30% followed by 60% stenosis; pressure wire measurement demonstrated no significant gradient; circumflex had 30% stenosis, right coronary artery has 40% stenosed; EF 75-80%  . Diverticulosis   . GERD (gastroesophageal reflux disease)   . Hyperlipidemia   . Hypertension   . Stroke [facility] 2003   With a left MCA occlusion  . TIA (transient ischemic attack)     Past Surgical History:  Procedure Laterality Date  . ABDOMINAL HYSTERECTOMY     partial and complete - x  surgeries  . APPENDECTOMY    . CHOLECYSTECTOMY    . EYE SURGERY Bilateral    cataracts  . Hysterectomy-type unspecified    . TONSILLECTOMY      Social History   Socioeconomic History  . Marital status: Widowed    Spouse name: Not on file  . Number of children: Not on file  . Years of education: Not on file  .  Highest education level: Not on file  Occupational History  . 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 Financial resource strain: Not on file  . Food insecurity:    Worry: Not on file    Inability: Not on file  . Transportation needs:    Medical: Not on file    Non-medical: Not on file  Tobacco Use  . Smoking status: Former Smoker    Last attempt to quit: 06/06/1985    Years since quitting: 32.9  . Smokeless tobacco: Never Used  . Tobacco comment: Approximately 10-pack-year history  Substance and Sexual Activity  . Alcohol use: No  . Drug use: No  . Sexual activity: Not on file  Lifestyle  . Physical activity:    Days per week: Not on file    Minutes per session: Not on file  . Stress: Not on file  Relationships  . Social connections:    Talks on phone: Not on file    Gets together: Not on file    Attends religious service: Not on file    Active member of club or organization: Not on file    Attends meetings of clubs or organizations: Not on file    Relationship status: Not on file  . Intimate partner violence:    Fear  of current or ex partner: Not on file    Emotionally abused: Not on file    Physically abused: Not on file    Forced sexual activity: Not on file  Other Topics Concern  . Not on file  Social History Narrative   Lives in [place] with her son.   Has been widowed for about 3 years.    Outpatient Encounter Medications as of 05/28/2018  Medication Sig  . aspirin EC 81 MG tablet Take 81 mg by mouth daily. Take 2 tablets daily  . atorvastatin (LIPITOR) 20 MG tablet Take 1 tablet (20 mg total) by mouth at bedtime.  . fluticasone (FLONASE) 50 MCG/ACT nasal spray Place 2 sprays into both nostrils daily.  Marland Kitchen loratadine (CLARITIN) 10 MG tablet Take 1 tablet (10 mg total) by mouth daily.  . Multiple Vitamins-Minerals (ICAPS AREDS 2) CAPS Take 2 capsules by mouth daily.  Marland Kitchen omeprazole (PRILOSEC) 20 MG capsule Take 1 capsule (20 mg  total) by mouth daily.  Marland Kitchen telmisartan (MICARDIS) 80 MG tablet Take 1 tablet (80 mg total) by mouth daily.  . verapamil (CALAN-SR) 120 MG CR tablet Take 1 tablet (120 mg total) by mouth at bedtime.  . lidocaine (LIDODERM) 5 % Place 1 patch onto the skin daily. Remove & Discard patch within 12 hours or as directed by MD  . [EXPIRED] methylPREDNISolone acetate (DEPO-MEDROL) injection 80 mg    No facility-administered encounter medications on file as of 05/28/2018.     Allergies  Allergen Reactions  . Livalo [Pitavastatin] Other (See Comments)    Causes dizziness  . Simvastatin Other (See Comments)    Causes dizziness  . Lisinopril     Hallucinations, resolved on ARB    Review of Systems  Constitutional: Negative for chills, fatigue and fever.  Respiratory: Negative for chest tightness and shortness of breath.   Cardiovascular: Negative for chest pain, palpitations and leg swelling.  Gastrointestinal: Negative for abdominal pain, constipation, diarrhea, nausea and vomiting.  Genitourinary: Positive for difficulty urinating. Negative for flank pain, frequency and urgency.  Musculoskeletal: Positive for back pain. Negative for gait problem, myalgias, neck pain and neck stiffness.  Neurological: Negative for weakness, numbness and headaches.  Psychiatric/Behavioral: Negative for confusion.  All other systems reviewed and are negative.       Objective:    BP 134/77 (BP Location: Left Arm, Cuff Size: Normal)   Pulse 86   Temp (!) 97.3 F (36.3 C) (Oral)   Ht 4\' 11"  (1.499 m)   Wt 137 lb (62.1 kg)   BMI 27.67 kg/m    Wt Readings from Last 3 Encounters:  05/28/18 137 lb (62.1 kg)  04/23/18 140 lb (63.5 kg)  03/26/18 135 lb (61.2 kg)    Physical Exam Vitals signs and nursing note reviewed.  Constitutional:      General: She is in acute distress (mild).     Appearance: Normal appearance. She is well-developed and well-groomed.  HENT:     Head: Normocephalic and atraumatic.    Eyes:     Conjunctiva/sclera: Conjunctivae normal.     Pupils: Pupils are equal, round, and reactive to light.  Neck:     Musculoskeletal: Normal range of motion and neck supple. No neck rigidity or muscular tenderness.  Cardiovascular:     Rate and Rhythm: Normal rate and regular rhythm.     Pulses: Normal pulses.     Heart sounds: Normal heart sounds. No murmur. No friction rub. No gallop.   Pulmonary:  Effort: Pulmonary effort is normal. No respiratory distress.     Breath sounds: Normal breath sounds.  Abdominal:     General: Bowel sounds are normal.     Palpations: Abdomen is soft.     Tenderness: There is no abdominal tenderness.  Musculoskeletal:     Cervical back: Normal.     Thoracic back: Normal.     Lumbar back: She exhibits decreased range of motion, tenderness and pain. She exhibits no bony tenderness, no swelling, no edema, no deformity, no laceration, no spasm and normal pulse.       Back:     Comments: Positive left straight leg raise test. Pain with flexion, extension, and lateral rotation.   Skin:    General: Skin is warm and dry.     Capillary Refill: Capillary refill takes less than 2 seconds.  Neurological:     General: No focal deficit present.     Mental Status: She is alert and oriented to person, place, and time.  Psychiatric:        Mood and Affect: Mood normal.        Behavior: Behavior normal. Behavior is cooperative.        Thought Content: Thought content normal.        Judgment: Judgment normal.     Results for orders placed or performed in visit on 03/00/92  Basic Metabolic Panel  Result Value Ref Range   Glucose 104 (H) 65 - 99 mg/dL   BUN 24 8 - 27 mg/dL   Creatinine, Ser 1.40 (H) 0.57 - 1.00 mg/dL   GFR calc non Af Amer 35 (L) >59 mL/min/1.73   GFR calc Af Amer 40 (L) >59 mL/min/1.73   BUN/Creatinine Ratio 17 12 - 28   Sodium 141 134 - 144 mmol/L   Potassium 4.3 3.5 - 5.2 mmol/L   Chloride 103 96 - 106 mmol/L   CO2 23 20 - 29  mmol/L   Calcium 10.3 8.7 - 10.3 mg/dL       Pertinent labs & imaging results that were available during my care of the patient were reviewed by me and considered in my medical decision making.  Assessment & Plan:  Aithana was seen today for lower back pain radiating into left leg.  Diagnoses and all orders for this visit:  Acute midline low back pain with left-sided sciatica Back strengthening and stretching exercises. Can take overt the counter Tylenol as needed for pain. Topical Biofreeze or Thermacare if beneficial. Lidoderm patches. Report any new or worsening symptoms.  -     methylPREDNISolone acetate (DEPO-MEDROL) injection 80 mg -     lidocaine (LIDODERM) 5 %; Place 1 patch onto the skin daily. Remove & Discard patch within 12 hours or as directed by MD     Continue all other maintenance medications.  Follow up plan: Return in about 4 weeks (around 06/25/2018), or if symptoms worsen or fail to improve.  Educational handout given for back pain, sciatica   The above assessment and management plan was discussed with the patient. The patient verbalized understanding of and has agreed to the management plan. Patient is aware to call the clinic if symptoms persist or worsen. Patient is aware when to return to the clinic for a follow-up visit. Patient educated on when it is appropriate to go to the emergency department.   Monia Pouch, FNP-C [facility] [phone]

## 2018-05-31 ENCOUNTER — Telehealth: Payer: Self-pay

## 2018-05-31 NOTE — Telephone Encounter (Signed)
Patient aware and verbalizes understanding. 

## 2018-05-31 NOTE — Telephone Encounter (Signed)
She can get this over the counter.  

## 2018-05-31 NOTE — Telephone Encounter (Signed)
Insurance denied prior auth for Lidocaine patch

## 2018-06-07 ENCOUNTER — Other Ambulatory Visit: Payer: Self-pay | Admitting: Family Medicine

## 2018-07-20 ENCOUNTER — Ambulatory Visit (INDEPENDENT_AMBULATORY_CARE_PROVIDER_SITE_OTHER): Payer: Medicare HMO | Admitting: Family Medicine

## 2018-07-20 VITALS — BP 165/77 | HR 64 | Temp 97.4°F | Ht 59.0 in | Wt 137.0 lb

## 2018-07-20 DIAGNOSIS — N183 Chronic kidney disease, stage 3 unspecified: Secondary | ICD-10-CM

## 2018-07-20 DIAGNOSIS — R42 Dizziness and giddiness: Secondary | ICD-10-CM | POA: Diagnosis not present

## 2018-07-20 DIAGNOSIS — I10 Essential (primary) hypertension: Secondary | ICD-10-CM

## 2018-07-20 DIAGNOSIS — Z79899 Other long term (current) drug therapy: Secondary | ICD-10-CM | POA: Diagnosis not present

## 2018-07-20 DIAGNOSIS — E782 Mixed hyperlipidemia: Secondary | ICD-10-CM | POA: Diagnosis not present

## 2018-07-20 DIAGNOSIS — E559 Vitamin D deficiency, unspecified: Secondary | ICD-10-CM | POA: Diagnosis not present

## 2018-07-20 DIAGNOSIS — R809 Proteinuria, unspecified: Secondary | ICD-10-CM | POA: Diagnosis not present

## 2018-07-20 DIAGNOSIS — D509 Iron deficiency anemia, unspecified: Secondary | ICD-10-CM | POA: Diagnosis not present

## 2018-07-20 MED ORDER — TELMISARTAN 80 MG PO TABS: 80.0000 mg | ORAL_TABLET | Freq: Every day | ORAL | 1 refills | Status: DC

## 2018-07-20 NOTE — Addendum Note (Signed)
Addended by: [name] on: 07/20/2018 08:56 AM   Modules accepted: Orders

## 2018-07-20 NOTE — Progress Notes (Signed)
Subjective: CC: HTN PCP: [name], DO Amanda [name] is a 85 y.o. female presenting to clinic today for:  1. HTN, HLD, CKD3 Patient here for interval follow-up of hypertension, hyperlipidemia.  She reports compliance with Lipitor, verapamil.  She was unaware that she was taking Micardis until she found at the bottom of her purse.  Hydrochlorothiazide was discontinued by her renal specialist because she was having orthostasis.  Of note, she also has history of CKD 3 and is under the care of Dr. Lowanda Foster in [place].  She has labs to collect for him today and will be following up with him within the next week.  She denies any chest pain, shortness of breath, lower extremity edema.  She does have some dizziness with position changes or if she bends over too far.  She denies any recent falls.   ROS: Per HPI  Allergies  Allergen Reactions  . Livalo [Pitavastatin] Other (See Comments)    Causes dizziness  . Simvastatin Other (See Comments)    Causes dizziness  . Lisinopril     Hallucinations, resolved on ARB   Past Medical History:  Diagnosis Date  . Allergy   . Anxiety   . Coronary artery disease    LAD 30% followed by 60% stenosis; pressure wire measurement demonstrated no significant gradient; circumflex had 30% stenosis, right coronary artery has 40% stenosed; EF 75-80%  . Diverticulosis   . GERD (gastroesophageal reflux disease)   . Hyperlipidemia   . Hypertension   . Stroke [facility]) 2003   With a left MCA occlusion  . TIA (transient ischemic attack)     Current Outpatient Medications:  .  aspirin EC 81 MG tablet, Take 81 mg by mouth daily. Take 2 tablets daily, Disp: , Rfl:  .  atorvastatin (LIPITOR) 20 MG tablet, TAKE 1 TABLET (20 MG TOTAL) BY MOUTH AT BEDTIME., Disp: 90 tablet, Rfl: 0 .  fluticasone (FLONASE) 50 MCG/ACT nasal spray, Place 2 sprays into both nostrils daily., Disp: 16 g, Rfl: 6 .  lidocaine (LIDODERM) 5 %, Place 1 patch onto the skin daily. Remove  & Discard patch within 12 hours or as directed by MD, Disp: 30 patch, Rfl: 0 .  loratadine (CLARITIN) 10 MG tablet, Take 1 tablet (10 mg total) by mouth daily., Disp: 30 tablet, Rfl: 11 .  Multiple Vitamins-Minerals (ICAPS AREDS 2) CAPS, Take 2 capsules by mouth daily., Disp: , Rfl:  .  omeprazole (PRILOSEC) 20 MG capsule, TAKE 1 CAPSULE (20 MG TOTAL) BY MOUTH DAILY., Disp: 90 capsule, Rfl: 0 .  telmisartan (MICARDIS) 80 MG tablet, Take 1 tablet (80 mg total) by mouth daily., Disp: 90 tablet, Rfl: 1 .  verapamil (CALAN-SR) 120 MG CR tablet, Take 1 tablet (120 mg total) by mouth at bedtime., Disp: 90 tablet, Rfl: 1 Social History   Socioeconomic History  . Marital status: Widowed    Spouse name: Not on file  . Number of children: Not on file  . Years of education: Not on file  . Highest education level: Not on file  Occupational History  . 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 Financial resource strain: Not on file  . Food insecurity:    Worry: Not on file    Inability: Not on file  . Transportation needs:    Medical: Not on file    Non-medical: Not on file  Tobacco Use  . Smoking status: Former Smoker  Last attempt to quit: 06/06/1985    Years since quitting: 33.1  . Smokeless tobacco: Never Used  . Tobacco comment: Approximately 10-pack-year history  Substance and Sexual Activity  . Alcohol use: No  . Drug use: No  . Sexual activity: Not on file  Lifestyle  . Physical activity:    Days per week: Not on file    Minutes per session: Not on file  . Stress: Not on file  Relationships  . Social connections:    Talks on phone: Not on file    Gets together: Not on file    Attends religious service: Not on file    Active member of club or organization: Not on file    Attends meetings of clubs or organizations: Not on file    Relationship status: Not on file  . Intimate partner violence:    Fear of current or ex partner: Not on file     Emotionally abused: Not on file    Physically abused: Not on file    Forced sexual activity: Not on file  Other Topics Concern  . Not on file  Social History Narrative   Lives in [place] with her son.   Has been widowed for about 3 years.   Family History  Problem Relation Age of Onset  . Cancer Mother        colonoscopy   . Cancer Father        unsure if cancer  --- tumor on brain  . Heart attack Sister   . Heart attack Brother   . Diabetes Brother   . GI problems Son   . COPD Sister   . Hypertension Brother   . Heart disease Brother   . Heart attack Brother   . Heart disease Brother   . Alcohol abuse Brother     Objective: Office vital signs reviewed. BP (!) 165/77   Pulse 64   Temp (!) 97.4 F (36.3 C) (Oral)   Ht 4\' 11"  (1.499 m)   Wt 137 lb (62.1 kg)   BMI 27.67 kg/m   Physical Examination:  General: Awake, alert, well nourished, No acute distress HEENT: Normal, sclera white, MMM Cardio: regular rate and rhythm, S1S2 heard, no murmurs appreciated Pulm: clear to auscultation bilaterally, no wheezes, rhonchi or rales; normal work of breathing on room air Extremities: warm, well perfused, No edema, cyanosis or clubbing; +2 pulses bilaterally  Orthostatic VS for the past 24 hrs:  BP- Lying Pulse- Lying BP- Sitting Pulse- Sitting BP- Standing at 0 minutes Pulse- Standing at 0 minutes  07/20/18 0831 151/74 70 160/75 67 148/84 66     Assessment/ Plan: 85 y.o. female   1. Essential hypertension Not at goal but has been previously at goal.  Unsure why the sudden change.  We collected orthostatic vital signs which were fairly unremarkable.  I am unsure that patient is totally been compliant with medication regimen as she seemed surprised that she was on Micardis until she found the bottle in her purse.  I have recommended that she resume use of dual therapy and continue to monitor blood pressures closely.  She has follow-up with nephrology soon.  We will wait her  recheck with them before augmenting her blood pressure regimen.  She is currently on max dose of Micardis at 80 mg and on verapamil 120 mg daily.  Could consider increasing verapamil dose.  2. CKD (chronic kidney disease) stage 3, GFR 30-59 ml/min (HCC) Has follow-up with Dr. Lowanda Foster in  the next week.  3. Postural dizziness We discussed hydration and changing positions slowly.  4. Mixed hyperlipidemia Continue atorvastatin 20 mg daily.   No orders of the defined types were placed in this encounter.  No orders of the defined types were placed in this encounter.    [name], DO [place] [phone]

## 2018-07-20 NOTE — Patient Instructions (Signed)
I will reach out to your kidney specialist about your blood pressure regimen. I was under the impression you were still taking the Micardis (temisartan).  Keep an eye on blood pressures.  Your goal is less than 150/90.

## 2018-07-25 DIAGNOSIS — D509 Iron deficiency anemia, unspecified: Secondary | ICD-10-CM | POA: Diagnosis not present

## 2018-07-25 DIAGNOSIS — N183 Chronic kidney disease, stage 3 (moderate): Secondary | ICD-10-CM | POA: Diagnosis not present

## 2018-08-14 ENCOUNTER — Encounter: Payer: Self-pay | Admitting: Nurse Practitioner

## 2018-08-14 ENCOUNTER — Ambulatory Visit (INDEPENDENT_AMBULATORY_CARE_PROVIDER_SITE_OTHER): Payer: Medicare HMO | Admitting: Nurse Practitioner

## 2018-08-14 VITALS — BP 151/80 | HR 81 | Temp 96.9°F | Ht 59.0 in | Wt 138.0 lb

## 2018-08-14 DIAGNOSIS — N3000 Acute cystitis without hematuria: Secondary | ICD-10-CM

## 2018-08-14 DIAGNOSIS — R300 Dysuria: Secondary | ICD-10-CM

## 2018-08-14 LAB — URINALYSIS, COMPLETE
BILIRUBIN UA: NEGATIVE
Ketones, UA: NEGATIVE
Nitrite, UA: POSITIVE — AB
PH UA: 7 (ref 5.0–7.5)
Specific Gravity, UA: 1.01 (ref 1.005–1.030)
Urobilinogen, Ur: 2 mg/dL — ABNORMAL HIGH (ref 0.2–1.0)

## 2018-08-14 LAB — MICROSCOPIC EXAMINATION: WBC, UA: >30 /hpf — AB (ref 0–5)

## 2018-08-14 MED ORDER — CEPHALEXIN 500 MG PO CAPS: 500.0000 mg | ORAL_CAPSULE | Freq: Two times a day (BID) | ORAL | 0 refills | Status: DC

## 2018-08-14 NOTE — Patient Instructions (Signed)
Urinary Tract Infection, Adult A urinary tract infection (UTI) is an infection of any part of the urinary tract. The urinary tract includes:  The kidneys.  The ureters.  The bladder.  The urethra. These organs make, store, and get rid of pee (urine) in the body. What are the causes? This is caused by germs (bacteria) in your genital area. These germs grow and cause swelling (inflammation) of your urinary tract. What increases the risk? You are more likely to develop this condition if:  You have a small, thin tube (catheter) to drain pee.  You cannot control when you pee or poop (incontinence).  You are female, and: ? You use these methods to prevent pregnancy: ? A medicine that kills sperm (spermicide). ? A device that blocks sperm (diaphragm). ? You have low levels of a female hormone (estrogen). ? You are pregnant.  You have genes that add to your risk.  You are sexually active.  You take antibiotic medicines.  You have trouble peeing because of: ? A prostate that is bigger than normal, if you are female. ? A blockage in the part of your body that drains pee from the bladder (urethra). ? A kidney stone. ? A nerve condition that affects your bladder (neurogenic bladder). ? Not getting enough to drink. ? Not peeing often enough.  You have other conditions, such as: ? Diabetes. ? A weak disease-fighting system (immune system). ? Sickle cell disease. ? Gout. ? Injury of the spine. What are the signs or symptoms? Symptoms of this condition include:  Needing to pee right away (urgently).  Peeing often.  Peeing small amounts often.  Pain or burning when peeing.  Blood in the pee.  Pee that smells bad or not like normal.  Trouble peeing.  Pee that is cloudy.  Fluid coming from the vagina, if you are female.  Pain in the belly or lower back. Other symptoms include:  Throwing up (vomiting).  No urge to eat.  Feeling mixed up (confused).  Being tired  and grouchy (irritable).  A fever.  Watery poop (diarrhea). How is this treated? This condition may be treated with:  Antibiotic medicine.  Other medicines.  Drinking enough water. Follow these instructions at home:  Medicines  Take over-the-counter and prescription medicines only as told by your doctor.  If you were prescribed an antibiotic medicine, take it as told by your doctor. Do not stop taking it even if you start to feel better. General instructions  Make sure you: ? Pee until your bladder is empty. ? Do not hold pee for a long time. ? Empty your bladder after sex. ? Wipe from front to back after pooping if you are a female. Use each tissue one time when you wipe.  Drink enough fluid to keep your pee pale yellow.  Keep all follow-up visits as told by your doctor. This is important. Contact a doctor if:  You do not get better after 1-2 days.  Your symptoms go away and then come back. Get help right away if:  You have very bad back pain.  You have very bad pain in your lower belly.  You have a fever.  You are sick to your stomach (nauseous).  You are throwing up. Summary  A urinary tract infection (UTI) is an infection of any part of the urinary tract.  This condition is caused by germs in your genital area.  There are many risk factors for a UTI. These include having a small, thin   tube to drain pee and not being able to control when you pee or poop.  Treatment includes antibiotic medicines for germs.  Drink enough fluid to keep your pee pale yellow. This information is not intended to replace advice given to you by your health care provider. Make sure you discuss any questions you have with your health care provider. Document Released: 11/09/2007 Document Revised: 11/30/2017 Document Reviewed: 11/30/2017 Elsevier Interactive Patient Education  2019 [employer] Inc[employer]  

## 2018-08-14 NOTE — Progress Notes (Signed)
   Subjective:    Patient ID: Marshayla T Bonnell, female    DOB: 08/25/1930, 85 y.o.   MRN: [id number]   Chief Complaint: Dysuria   HPI Patient come sin c/o dysuria, urgency  and frequency . She has been drinking cranberry juice and not helping. Started yesterday and is worse today.   Review of Systems  Constitutional: Negative.   HENT: Negative.   Respiratory: Negative.   Cardiovascular: Negative.   Genitourinary: Positive for dysuria, frequency and urgency.  Neurological: Negative.   Psychiatric/Behavioral: Negative.   All other systems reviewed and are negative.      Objective:   Physical Exam Vitals signs and nursing note reviewed.  Constitutional:      Appearance: Normal appearance.  Cardiovascular:     Rate and Rhythm: Normal rate and regular rhythm.     Heart sounds: Normal heart sounds.  Pulmonary:     Effort: Pulmonary effort is normal.     Breath sounds: Normal breath sounds.  Abdominal:     General: Abdomen is flat.     Palpations: Abdomen is soft.     Tenderness: There is no abdominal tenderness. There is no right CVA tenderness or left CVA tenderness.  Skin:    General: Skin is warm and dry.  Neurological:     General: No focal deficit present.     Mental Status: She is alert and oriented to person, place, and time.  Psychiatric:        Mood and Affect: Mood normal.        Behavior: Behavior normal.    BP (!) 151/80   Pulse 81   Temp (!) 96.9 F (36.1 C) (Oral)   Ht 4\' 11"  (1.499 m)   Wt 138 lb (62.6 kg)   BMI 27.87 kg/m   UA- nit (+) WBC >30       Assessment & Plan:  Shanette T Sosa in today with chief complaint of Dysuria   1. Dysuria - Urinalysis, Complete  2. Acute cystitis without hematuria Take medication as prescribe Cotton underwear Take shower not bath Cranberry juice, yogurt Force fluids AZO over the counter X2 days Culture pending RTO prn   Mary-Margaret Hassell Done, FNP  - Urine Culture - cephALEXin (KEFLEX) 500 MG capsule;  Take 1 capsule (500 mg total) by mouth 2 (two) times daily.  Dispense: 14 capsule; Refill: 0

## 2018-08-16 LAB — URINE CULTURE

## 2018-10-02 ENCOUNTER — Other Ambulatory Visit: Payer: Self-pay | Admitting: Family Medicine

## 2018-11-05 ENCOUNTER — Other Ambulatory Visit: Payer: Self-pay

## 2018-11-05 ENCOUNTER — Encounter (INDEPENDENT_AMBULATORY_CARE_PROVIDER_SITE_OTHER): Payer: Self-pay

## 2018-11-06 ENCOUNTER — Other Ambulatory Visit: Payer: Self-pay

## 2018-11-06 ENCOUNTER — Ambulatory Visit (INDEPENDENT_AMBULATORY_CARE_PROVIDER_SITE_OTHER): Payer: Medicare HMO | Admitting: Family Medicine

## 2018-11-06 ENCOUNTER — Encounter: Payer: Self-pay | Admitting: Family Medicine

## 2018-11-06 VITALS — BP 151/70 | HR 65 | Temp 97.7°F | Ht 59.0 in | Wt 135.0 lb

## 2018-11-06 DIAGNOSIS — I10 Essential (primary) hypertension: Secondary | ICD-10-CM

## 2018-11-06 DIAGNOSIS — Z23 Encounter for immunization: Secondary | ICD-10-CM | POA: Diagnosis not present

## 2018-11-06 DIAGNOSIS — E782 Mixed hyperlipidemia: Secondary | ICD-10-CM | POA: Diagnosis not present

## 2018-11-06 DIAGNOSIS — N183 Chronic kidney disease, stage 3 unspecified: Secondary | ICD-10-CM

## 2018-11-06 DIAGNOSIS — R42 Dizziness and giddiness: Secondary | ICD-10-CM

## 2018-11-06 LAB — LIPID PANEL

## 2018-11-06 MED ORDER — VERAPAMIL HCL ER 180 MG PO TBCR: 180.0000 mg | EXTENDED_RELEASE_TABLET | Freq: Every day | ORAL | 0 refills | Status: DC

## 2018-11-06 MED ORDER — PRESERVISION AREDS 2 PO CAPS: 1.0000 | ORAL_CAPSULE | Freq: Two times a day (BID) | ORAL | 2 refills | Status: DC

## 2018-11-06 NOTE — Addendum Note (Signed)
Addended by: [name] on: 11/06/2018 12:42 PM   Modules accepted: Orders

## 2018-11-06 NOTE — Addendum Note (Signed)
Addended byCarrolyn Leigh on: 11/06/2018 01:27 PM   Modules accepted: Orders

## 2018-11-06 NOTE — Patient Instructions (Signed)
You had labs performed today.  You will be contacted with the results of the labs once they are available, usually in the next 3 business days for routine lab work.  If you had a pap smear or biopsy performed, expect to be contacted in about 7-10 days.  We discussed changing positions slowly. Monitor your blood pressure daily.  Call me if your blood pressures continue to be >150/90 or go below 120/70 or if you feel bad on the new dose of the Verapamil.  Remember to stop the 120mg  tablet when you get the new 180mg  tablets in the mail.  Orthostatic Hypotension Blood pressure is a measurement of how strongly, or weakly, your blood is pressing against the walls of your arteries. Orthostatic hypotension is a sudden drop in blood pressure that happens when you quickly change positions, such as when you get up from sitting or lying down. Arteries are blood vessels that carry blood from your heart throughout your body. When blood pressure is too low, you may not get enough blood to your brain or to the rest of your organs. This can cause weakness, light-headedness, rapid heartbeat, and fainting. This can last for just a few seconds or for up to a few minutes. Orthostatic hypotension is usually not a serious problem. However, if it happens frequently or gets worse, it may be a sign of something more serious. What are the causes? This condition may be caused by:  Sudden changes in posture, such as standing up quickly after you have been sitting or lying down.  Blood loss.  Loss of body fluids (dehydration).  Heart problems.  Hormone (endocrine) problems.  Pregnancy.  Severe infection.  Lack of certain nutrients.  Severe allergic reactions (anaphylaxis).  Certain medicines, such as blood pressure medicine or medicines that make the body lose excess fluids (diuretics). Sometimes, this condition can be caused by not taking medicine as directed, such as taking too much of a certain medicine. What  increases the risk? The following factors may make you more likely to develop this condition:  Age. Risk increases as you get older.  Conditions that affect the heart or the central nervous system.  Taking certain medicines, such as blood pressure medicine or diuretics.  Being pregnant. What are the signs or symptoms? Symptoms of this condition may include:  Weakness.  Light-headedness.  Dizziness.  Blurred vision.  Fatigue.  Rapid heartbeat.  Fainting, in severe cases. How is this diagnosed? This condition is diagnosed based on:  Your medical history.  Your symptoms.  Your blood pressure measurement. Your health care provider will check your blood pressure when you are: ? Lying down. ? Sitting. ? Standing. A blood pressure reading is recorded as two numbers, such as "120 over 80" (or 120/80). The first ("top") number is called the systolic pressure. It is a measure of the pressure in your arteries as your heart beats. The second ("bottom") number is called the diastolic pressure. It is a measure of the pressure in your arteries when your heart relaxes between beats. Blood pressure is measured in a unit called mm Hg. Healthy blood pressure for most adults is 120/80. If your blood pressure is below 90/60, you may be diagnosed with hypotension. Other information or tests that may be used to diagnose orthostatic hypotension include:  Your other vital signs, such as your heart rate and temperature.  Blood tests.  Tilt table test. For this test, you will be safely secured to a table that moves you from  a lying position to an upright position. Your heart rhythm and blood pressure will be monitored during the test. How is this treated? This condition may be treated by:  Changing your diet. This may involve eating more salt (sodium) or drinking more water.  Taking medicines to raise your blood pressure.  Changing the dosage of certain medicines you are taking that might be  lowering your blood pressure.  Wearing compression stockings. These stockings help to prevent blood clots and reduce swelling in your legs. In some cases, you may need to go to the hospital for:  Fluid replacement. This means you will receive fluids through an IV.  Blood replacement. This means you will receive donated blood through an IV (transfusion).  Treating an infection or heart problems, if this applies.  Monitoring. You may need to be monitored while medicines that you are taking wear off. Follow these instructions at home: Eating and drinking   Drink enough fluid to keep your urine pale yellow.  Eat a healthy diet, and follow instructions from your health care provider about eating or drinking restrictions. A healthy diet includes: ? Fresh fruits and vegetables. ? Whole grains. ? Lean meats. ? Low-fat dairy products.  Eat extra salt only as directed. Do not add extra salt to your diet unless your health care provider told you to do that.  Eat frequent, small meals.  Avoid standing up suddenly after eating. Medicines  Take over-the-counter and prescription medicines only as told by your health care provider. ? Follow instructions from your health care provider about changing the dosage of your current medicines, if this applies. ? Do not stop or adjust any of your medicines on your own. General instructions   Wear compression stockings as told by your health care provider.  Get up slowly from lying down or sitting positions. This gives your blood pressure a chance to adjust.  Avoid hot showers and excessive heat as directed by your health care provider.  Return to your normal activities as told by your health care provider. Ask your health care provider what activities are safe for you.  Do not use any products that contain nicotine or tobacco, such as cigarettes, e-cigarettes, and chewing tobacco. If you need help quitting, ask your health care provider.  Keep  all follow-up visits as told by your health care provider. This is important. Contact a health care provider if you:  Vomit.  Have diarrhea.  Have a fever for more than 2-3 days.  Feel more thirsty than usual.  Feel weak and tired. Get help right away if you:  Have chest pain.  Have a fast or irregular heartbeat.  Develop numbness in any part of your body.  Cannot move your arms or your legs.  Have trouble speaking.  Become sweaty or feel light-headed.  Faint.  Feel short of breath.  Have trouble staying awake.  Feel confused. Summary  Orthostatic hypotension is a sudden drop in blood pressure that happens when you quickly change positions.  Orthostatic hypotension is usually not a serious problem.  It is diagnosed by having your blood pressure taken lying down, sitting, and then standing.  It may be treated by changing your diet or adjusting your medicines. This information is not intended to replace advice given to you by your health care provider. Make sure you discuss any questions you have with your health care provider. Document Released: 05/13/2002 Document Revised: 11/16/2017 Document Reviewed: 11/16/2017 Elsevier Interactive Patient Education  [employer].

## 2018-11-06 NOTE — Progress Notes (Signed)
Subjective: CC: HTN PCP: [name], DO YIR:SWNI T Deshazo is a 85 y.o. female presenting to clinic today for:  1.  Hypertension/hyperlipidemia/CKD 3 Patient here for interval follow-up on above issues.  She reports compliance with verapamil 120 mg daily and Micardis 80 mg daily.  She also takes Lipitor 20 mg daily.  Medical history significant for CVA in the past.  She continues to follow with Dr. Lowanda Foster regularly and was told to get her labs done today here as they are not seeing patients in office right now.  She denies any chest pain, shortness of breath, lower extremity edema.  She does have occasional dizziness particularly if she bends over and then goes to stand up.  ROS: Per HPI  Allergies  Allergen Reactions  . Livalo [Pitavastatin] Other (See Comments)    Causes dizziness  . Simvastatin Other (See Comments)    Causes dizziness  . Lisinopril     Hallucinations, resolved on ARB   Past Medical History:  Diagnosis Date  . Allergy   . Anxiety   . Coronary artery disease    LAD 30% followed by 60% stenosis; pressure wire measurement demonstrated no significant gradient; circumflex had 30% stenosis, right coronary artery has 40% stenosed; EF 75-80%  . Diverticulosis   . GERD (gastroesophageal reflux disease)   . Hyperlipidemia   . Hypertension   . Impaired renal function 04/23/2018  . Stroke [facility]) 2003   With a left MCA occlusion  . TIA (transient ischemic attack)     Current Outpatient Medications:  .  aspirin EC 81 MG tablet, Take 81 mg by mouth daily. Take 2 tablets daily, Disp: , Rfl:  .  atorvastatin (LIPITOR) 20 MG tablet, TAKE 1 TABLET AT BEDTIME, Disp: 90 tablet, Rfl: 0 .  iron polysaccharides (NIFEREX) 150 MG capsule, Take 150 mg by mouth daily., Disp: , Rfl:  .  loratadine (CLARITIN) 10 MG tablet, Take 1 tablet (10 mg total) by mouth daily., Disp: 30 tablet, Rfl: 11 .  Multiple Vitamins-Minerals (ICAPS AREDS 2) CAPS, Take 2 capsules by mouth daily.,  Disp: , Rfl:  .  omeprazole (PRILOSEC) 20 MG capsule, TAKE 1 CAPSULE EVERY DAY, Disp: 90 capsule, Rfl: 0 .  telmisartan (MICARDIS) 80 MG tablet, Take 1 tablet (80 mg total) by mouth daily., Disp: 90 tablet, Rfl: 1 .  verapamil (CALAN-SR) 120 MG CR tablet, TAKE 1 TABLET AT BEDTIME, Disp: 90 tablet, Rfl: 1 Social History   Socioeconomic History  . Marital status: Widowed    Spouse name: Not on file  . Number of children: Not on file  . Years of education: Not on file  . Highest education level: Not on file  Occupational History  . 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 Financial resource strain: Not on file  . Food insecurity:    Worry: Not on file    Inability: Not on file  . Transportation needs:    Medical: Not on file    Non-medical: Not on file  Tobacco Use  . Smoking status: Former Smoker    Last attempt to quit: 06/06/1985    Years since quitting: 33.4  . Smokeless tobacco: Never Used  . Tobacco comment: Approximately 10-pack-year history  Substance and Sexual Activity  . Alcohol use: No  . Drug use: No  . Sexual activity: Not on file  Lifestyle  . Physical activity:    Days per week: Not on file  Minutes per session: Not on file  . Stress: Not on file  Relationships  . Social connections:    Talks on phone: Not on file    Gets together: Not on file    Attends religious service: Not on file    Active member of club or organization: Not on file    Attends meetings of clubs or organizations: Not on file    Relationship status: Not on file  . Intimate partner violence:    Fear of current or ex partner: Not on file    Emotionally abused: Not on file    Physically abused: Not on file    Forced sexual activity: Not on file  Other Topics Concern  . Not on file  Social History Narrative   Lives in [place] with her son.   Has been widowed for about 3 years.   Family History  Problem Relation Age of Onset  . Cancer Mother         colonoscopy   . Cancer Father        unsure if cancer  --- tumor on brain  . Heart attack Sister   . Heart attack Brother   . Diabetes Brother   . GI problems Son   . COPD Sister   . Hypertension Brother   . Heart disease Brother   . Heart attack Brother   . Heart disease Brother   . Alcohol abuse Brother     Objective: Office vital signs reviewed. BP (!) 151/70   Pulse 65   Temp 97.7 F (36.5 C) (Oral)   Ht _0  (1.499 m)   Wt 135 lb (61.2 kg)   BMI 27.27 kg/m   Physical Examination:  General: Awake, alert, well nourished, No acute distress HEENT: Normal, sclera white, moist mucous membranes Cardio: regular rate and rhythm, S1S2 heard, no murmurs appreciated Pulm: clear to auscultation bilaterally, no wheezes, rhonchi or rales; normal work of breathing on room air Extremities: warm, well perfused, No edema, cyanosis or clubbing; +2 pulses bilaterally   Assessment/ Plan: 85 y.o. female   1. Essential hypertension Not controlled.   Increase verapamil to 100 mg daily.  Patient to continue Micardis at max dose of 80 mg daily.  Check renal function panel.  We will forward this to her nephrologist once resulted.  She is to continue monitoring blood pressures daily and will follow-up in 3 months, sooner if needed.  We discussed goals and thresholds which require further evaluation. - verapamil (CALAN-SR) 180 MG CR tablet; Take 1 tablet (180 mg total) by mouth at bedtime.  Dispense: 90 tablet; Refill: 0  2. CKD (chronic kidney disease) stage 3, GFR 30-59 ml/min (HCC) As above - CBC - VITAMIN D 25 Hydroxy (Vit-D Deficiency, Fractures) - CMP14+EGFR  3. Mixed hyperlipidemia Check lipid panel - Lipid Panel  4. Postural dizziness We discussed changing positions slowly and adequate hydration.    Orders Placed This Encounter  Procedures  . CBC  . Lipid Panel  . VITAMIN D 25 Hydroxy (Vit-D Deficiency, Fractures)  . CMP14+EGFR   Meds ordered this encounter   Medications  . verapamil (CALAN-SR) 180 MG CR tablet    Sig: Take 1 tablet (180 mg total) by mouth at bedtime.    Dispense:  90 tablet    Refill:  0    To replace 158m.     AJanora Norlander DO [phone]

## 2018-11-07 LAB — CMP14+EGFR
ALT: 9 IU/L (ref 0–32)
AST: 14 IU/L (ref 0–40)
Albumin/Globulin Ratio: 2.2 (ref 1.2–2.2)
Albumin: 4.4 g/dL (ref 3.6–4.6)
Alkaline Phosphatase: 92 IU/L (ref 39–117)
BUN/Creatinine Ratio: 13 (ref 12–28)
BUN: 13 mg/dL (ref 8–27)
Bilirubin Total: 0.3 mg/dL (ref 0.0–1.2)
CO2: 24 mmol/L (ref 20–29)
Calcium: 10.5 mg/dL — ABNORMAL HIGH (ref 8.7–10.3)
Chloride: 105 mmol/L (ref 96–106)
Creatinine, Ser: 1.03 mg/dL — ABNORMAL HIGH (ref 0.57–1.00)
GFR calc Af Amer: 57 mL/min/{1.73_m2} — ABNORMAL LOW (ref >59)
GFR calc non Af Amer: 50 mL/min/{1.73_m2} — ABNORMAL LOW (ref >59)
Globulin, Total: 2 g/dL (ref 1.5–4.5)
Glucose: 77 mg/dL (ref 65–99)
Potassium: 4.3 mmol/L (ref 3.5–5.2)
Sodium: 142 mmol/L (ref 134–144)
Total Protein: 6.4 g/dL (ref 6.0–8.5)

## 2018-11-07 LAB — CBC
Hematocrit: 39.5 % (ref 34.0–46.6)
Hemoglobin: 12.9 g/dL (ref 11.1–15.9)
MCH: 30.9 pg (ref 26.6–33.0)
MCHC: 32.7 g/dL (ref 31.5–35.7)
MCV: 95 fL (ref 79–97)
Platelets: 175 10*3/uL (ref 150–450)
RBC: 4.17 x10E6/uL (ref 3.77–5.28)
RDW: 13.9 % (ref 11.7–15.4)
WBC: 7.5 10*3/uL (ref 3.4–10.8)

## 2018-11-07 LAB — LIPID PANEL
Chol/HDL Ratio: 2.5 ratio (ref 0.0–4.4)
Cholesterol, Total: 102 mg/dL (ref 100–199)
HDL: 41 mg/dL (ref >39)
LDL Calculated: 14 mg/dL (ref 0–99)
Triglycerides: 234 mg/dL — ABNORMAL HIGH (ref 0–149)
VLDL Cholesterol Cal: 47 mg/dL — ABNORMAL HIGH (ref 5–40)

## 2018-11-07 LAB — VITAMIN D 25 HYDROXY (VIT D DEFICIENCY, FRACTURES): Vit D, 25-Hydroxy: 35.1 ng/mL (ref 30.0–100.0)

## 2018-11-23 ENCOUNTER — Other Ambulatory Visit: Payer: Self-pay | Admitting: Family Medicine

## 2018-12-31 ENCOUNTER — Telehealth: Payer: Self-pay | Admitting: Family Medicine

## 2018-12-31 ENCOUNTER — Ambulatory Visit (INDEPENDENT_AMBULATORY_CARE_PROVIDER_SITE_OTHER): Payer: Medicare HMO | Admitting: Physician Assistant

## 2018-12-31 ENCOUNTER — Encounter: Payer: Self-pay | Admitting: Physician Assistant

## 2018-12-31 DIAGNOSIS — K921 Melena: Secondary | ICD-10-CM

## 2018-12-31 NOTE — Progress Notes (Signed)
Telephone visit  Subjective: IR:JJOACZYS rectal PCP: [name], DO AYT:KZSW Amanda [name] is a 85 y.o. female calls for telephone consult today. Patient provides verbal consent for consult held via phone.  Patient is identified with 2 separate identifiers.  At this time the entire area is on COVID-19 social distancing and stay home orders are in place.  Patient is of higher risk and therefore we are performing this by a virtual method.  Location of patient: home Location of provider: [facility] Others present for call: no   I am seeing this patient for Dr. Lajuana Ripple, she is out of town this week.  This patient is having a phone visit for recent problem.  She has had diarrhea for several days.  She has had bright red bleeding in the rectal area and was concerned about this.  Her mother had colon cancer  She states that she is not having pain at the rectum.  She does take iron.  But she has taken that for many years.  She does not remember it ever making a change in her stools.  She also reports that her stools are black, very black.  She has not been using any over-the-counter stomach aids like Pepto-Bismol.  She reports that on a colonoscopy a few years ago she did have polyps.  However she was seen by Dr. Britta Mccreedy, and he is no longer in the area.  We will try to get her in with someone in [place] as soon as possible.   ROS: Per HPI  Allergies  Allergen Reactions  . Livalo [Pitavastatin] Other (See Comments)    Causes dizziness  . Simvastatin Other (See Comments)    Causes dizziness  . Lisinopril     Hallucinations, resolved on ARB   Past Medical History:  Diagnosis Date  . Allergy   . Anxiety   . Coronary artery disease    LAD 30% followed by 60% stenosis; pressure wire measurement demonstrated no significant gradient; circumflex had 30% stenosis, right coronary artery has 40% stenosed; EF 75-80%  . Diverticulosis   . GERD (gastroesophageal reflux disease)   .  Hyperlipidemia   . Hypertension   . Impaired renal function 04/23/2018  . Stroke [facility]) 2003   With a left MCA occlusion  . TIA (transient ischemic attack)     Current Outpatient Medications:  .  aspirin EC 81 MG tablet, Take 81 mg by mouth daily. Take 2 tablets daily, Disp: , Rfl:  .  atorvastatin (LIPITOR) 20 MG tablet, TAKE 1 TABLET AT BEDTIME, Disp: 90 tablet, Rfl: 0 .  iron polysaccharides (NIFEREX) 150 MG capsule, Take 150 mg by mouth daily., Disp: , Rfl:  .  loratadine (CLARITIN) 10 MG tablet, Take 1 tablet (10 mg total) by mouth daily., Disp: 30 tablet, Rfl: 11 .  Multiple Vitamins-Minerals (PRESERVISION AREDS 2) CAPS, Take 1 capsule by mouth 2 (two) times a day., Disp: 180 capsule, Rfl: 2 .  omeprazole (PRILOSEC) 20 MG capsule, TAKE 1 CAPSULE EVERY DAY, Disp: 90 capsule, Rfl: 0 .  telmisartan (MICARDIS) 80 MG tablet, Take 1 tablet (80 mg total) by mouth daily., Disp: 90 tablet, Rfl: 1 .  verapamil (CALAN-SR) 180 MG CR tablet, Take 1 tablet (180 mg total) by mouth at bedtime., Disp: 90 tablet, Rfl: 0  Assessment/ Plan: 85 y.o. female   1. Melena - CBC with Differential/Platelet; Future - CMP14+EGFR; Future - Iron; Future - Ambulatory referral to Gastroenterology  2. Hematochezia - CBC with  Differential/Platelet; Future - CMP14+EGFR; Future - Iron; Future - Ambulatory referral to Gastroenterology    No follow-ups on file.  Continue all other maintenance medications as listed above.  Start time: 9:20 AM End time: 9:32 AM  No orders of the defined types were placed in this encounter.   Particia Nearing PA-C [place] [phone]

## 2018-12-31 NOTE — Chronic Care Management (AMB) (Signed)
°  Chronic Care Management   Outreach Note  12/31/2018 Name: Nerida T Tuller MRN: [id number] DOB: [birth year]  Referred by: [name], DO Reason for referral : Chronic Care Management (Initial CCM outreach was unsuccessful. )   An unsuccessful telephone outreach was attempted today. The patient was referred to the case management team by for assistance with chronic care management and care coordination.   Follow Up Plan: The care management team will reach out to the patient again over the next 7 days.   [place]  [email or web address]   ??[phone]

## 2018-12-31 NOTE — Addendum Note (Signed)
Addended by: Liliane Bade on: 12/31/2018 09:51 AM   Modules accepted: Orders

## 2019-01-01 LAB — CBC WITH DIFFERENTIAL/PLATELET
Basophils Absolute: 0.1 10*3/uL (ref 0.0–0.2)
Basos: 1 %
EOS (ABSOLUTE): 0.3 10*3/uL (ref 0.0–0.4)
Eos: 4 %
Hematocrit: 34 % (ref 34.0–46.6)
Hemoglobin: 11.6 g/dL (ref 11.1–15.9)
Immature Grans (Abs): 0 10*3/uL (ref 0.0–0.1)
Immature Granulocytes: 0 %
Lymphocytes Absolute: 2.1 10*3/uL (ref 0.7–3.1)
Lymphs: 30 %
MCH: 32.5 pg (ref 26.6–33.0)
MCHC: 34.1 g/dL (ref 31.5–35.7)
MCV: 95 fL (ref 79–97)
Monocytes Absolute: 0.8 10*3/uL (ref 0.1–0.9)
Monocytes: 12 %
Neutrophils Absolute: 3.5 10*3/uL (ref 1.4–7.0)
Neutrophils: 53 %
Platelets: 157 10*3/uL (ref 150–450)
RBC: 3.57 x10E6/uL — ABNORMAL LOW (ref 3.77–5.28)
RDW: 13.2 % (ref 11.7–15.4)
WBC: 6.8 10*3/uL (ref 3.4–10.8)

## 2019-01-01 LAB — CMP14+EGFR
ALT: 8 IU/L (ref 0–32)
AST: 12 IU/L (ref 0–40)
Albumin/Globulin Ratio: 2.3 — ABNORMAL HIGH (ref 1.2–2.2)
Albumin: 4.1 g/dL (ref 3.6–4.6)
Alkaline Phosphatase: 75 IU/L (ref 39–117)
BUN/Creatinine Ratio: 18 (ref 12–28)
BUN: 20 mg/dL (ref 8–27)
Bilirubin Total: 0.3 mg/dL (ref 0.0–1.2)
CO2: 23 mmol/L (ref 20–29)
Calcium: 9.9 mg/dL (ref 8.7–10.3)
Chloride: 105 mmol/L (ref 96–106)
Creatinine, Ser: 1.14 mg/dL — ABNORMAL HIGH (ref 0.57–1.00)
GFR calc Af Amer: 51 mL/min/{1.73_m2} — ABNORMAL LOW (ref >59)
GFR calc non Af Amer: 44 mL/min/{1.73_m2} — ABNORMAL LOW (ref >59)
Globulin, Total: 1.8 g/dL (ref 1.5–4.5)
Glucose: 97 mg/dL (ref 65–99)
Potassium: 3.9 mmol/L (ref 3.5–5.2)
Sodium: 141 mmol/L (ref 134–144)
Total Protein: 5.9 g/dL — ABNORMAL LOW (ref 6.0–8.5)

## 2019-01-01 LAB — IRON: Iron: 88 ug/dL (ref 27–139)

## 2019-01-02 NOTE — Chronic Care Management (AMB) (Signed)
Chronic Care Management   Note  01/02/2019 Name: Amanda [name] MRN: [id number] DOB: 03-19-1931  Amanda [name] is a 85 y.o. year old female who is a primary care patient of [name], DO. I reached out to Myisha T Hopkin by phone today in response to a referral sent by [name]'s health plan.    [name] was given information about Chronic Care Management services today including:  1. CCM service includes personalized support from designated clinical staff supervised by her physician, including individualized plan of care and coordination with other care providers 2. 24/7 contact phone numbers for assistance for urgent and routine care needs. 3. Service will only be billed when office clinical staff spend 20 minutes or more in a month to coordinate care. 4. Only one practitioner may furnish and bill the service in a calendar month. 5. The patient may stop CCM services at any time (effective at the end of the month) by phone call to the office staff. 6. The patient will be responsible for cost sharing (co-pay) of up to 20% of the service fee (after annual deductible is met).  Patient agreed to services and verbal consent obtained.   Follow up plan: Telephone appointment with CCM team member scheduled for: 01/08/2019  [place]  [email or web address]   ??[phone]

## 2019-01-03 ENCOUNTER — Encounter: Payer: Self-pay | Admitting: Gastroenterology

## 2019-01-07 ENCOUNTER — Encounter: Payer: Self-pay | Admitting: *Deleted

## 2019-01-08 ENCOUNTER — Telehealth: Payer: Medicare HMO

## 2019-01-29 ENCOUNTER — Ambulatory Visit (INDEPENDENT_AMBULATORY_CARE_PROVIDER_SITE_OTHER): Payer: Medicare HMO | Admitting: *Deleted

## 2019-01-29 DIAGNOSIS — N183 Chronic kidney disease, stage 3 unspecified: Secondary | ICD-10-CM

## 2019-01-29 DIAGNOSIS — K921 Melena: Secondary | ICD-10-CM

## 2019-01-29 DIAGNOSIS — D508 Other iron deficiency anemias: Secondary | ICD-10-CM | POA: Diagnosis not present

## 2019-01-30 ENCOUNTER — Other Ambulatory Visit: Payer: Self-pay | Admitting: Family Medicine

## 2019-01-30 DIAGNOSIS — I10 Essential (primary) hypertension: Secondary | ICD-10-CM

## 2019-01-31 ENCOUNTER — Encounter: Payer: Self-pay | Admitting: *Deleted

## 2019-01-31 DIAGNOSIS — D649 Anemia, unspecified: Secondary | ICD-10-CM | POA: Insufficient documentation

## 2019-01-31 NOTE — Chronic Care Management (AMB) (Signed)
Chronic Care Management   Initial Visit Note  01/29/2019 Name: Amanda [name] MRN: [id number] DOB: 12/19/1930  Referred by: [name], DO Reason for referral : Chronic Care Management (RNCM Initial Visit)   Amanda [name] is a 85 y.o. year old female who is a primary care patient of [name], DO. The CCM team was consulted for assistance with chronic disease management and care coordination needs.   Review of patient status, including review of consultants reports, relevant laboratory and other test results, and collaboration with appropriate care team members and the patient's provider was performed as part of comprehensive patient evaluation and provision of chronic care management services.    SDOH (Social Determinants of Health) screening performed today: Physical Activity. See Care Plan for related entries.   I talked with Amanda [name] by telephone today. She retired from [employer] in 1996 but works part time for [employer] in [place]. 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 She typically works a couple of hours twice a week and really enjoys the work that she does. She lives at home and her son lives in her basement. He has it fixed up like an apartment but often joins her for meals and socialization. She was seen recently for episodes of rectal bleeding and that has resolved but she continues to have significant difficulty with constipation which is likely in part due to taking an iron supplement. A referral to GI was made and she has an upcoming appt with Dr Gordy Levan in [place] on 02/13/19.   Medications: Outpatient Encounter Medications as of 01/29/2019  Medication Sig  . aspirin EC 81 MG tablet Take 81 mg by mouth daily. Take 2 tablets daily  . atorvastatin (LIPITOR) 20 MG tablet TAKE 1 TABLET AT BEDTIME  . iron polysaccharides (NIFEREX) 150 MG capsule Take 150 mg by mouth daily.  Marland Kitchen loratadine (CLARITIN) 10 MG tablet Take 1  tablet (10 mg total) by mouth daily.  . Multiple Vitamins-Minerals (PRESERVISION AREDS 2) CAPS Take 1 capsule by mouth 2 (two) times a day.  Marland Kitchen omeprazole (PRILOSEC) 20 MG capsule TAKE 1 CAPSULE EVERY DAY  . telmisartan (MICARDIS) 80 MG tablet Take 1 tablet (80 mg total) by mouth daily.  . [DISCONTINUED] verapamil (CALAN-SR) 180 MG CR tablet Take 1 tablet (180 mg total) by mouth at bedtime.   No facility-administered encounter medications on file as of 01/29/2019.      Objective:  Lab Results  Component Value Date   WBC 6.8 12/31/2018   HGB 11.6 12/31/2018   HCT 34.0 12/31/2018   MCV 95 12/31/2018   PLT 157 12/31/2018     Chemistry      Component Value Date/Time   NA 141 12/31/2018 0951   K 3.9 12/31/2018 0951   CL 105 12/31/2018 0951   CO2 23 12/31/2018 0951   BUN 20 12/31/2018 0951   CREATININE 1.14 (H) 12/31/2018 0951      Component Value Date/Time   CALCIUM 9.9 12/31/2018 0951   ALKPHOS 75 12/31/2018 0951   AST 12 12/31/2018 0951   ALT 8 12/31/2018 0951   BILITOT 0.3 12/31/2018 0951       Assessment: Goals Addressed            This Visit's Progress     Patient Stated   . "I need to refill my iron pills" (pt-stated)       Current Barriers:  . Chronic  Disease Management support and education needs related to anemia  Nurse Case Manager Clinical Goal(s):  Marland Kitchen Over the next 5 days, patient will talk with Dr Florentina Addison office and request a refill on niferex (iron supplement)  Interventions:  . Reviewed recent labs including CBC . Discussed her need for iron supplements . Discussed side effects of iron . Advised to continue taking iron as directed by provider . Advised to call her pharmacy or Dr Florentina Addison office for refills of Niferex and to let me know if there are any problems with getting a refill  Patient Self Care Activities:  . Performs ADL's independently . Performs IADL's independently  Initial goal documentation     . "I would like to keep from  getting constipated" (pt-stated)       Current Barriers:  . Chronic Disease Management support and education needs related to constipation.  Nurse Case Manager Clinical Goal(s):  Marland Kitchen Over the next 30 days, patient will demonstrate improved self-management by experiencing less frequent episodes of constipation.  . Over the next 30 days, patient will utilize OTC treatments and lifestyle modifications to decrease constipation.  . Over the next 30 days, patient will keep all scheduled medical appointments including new patient appointment with gastroenterologist to address recent history of rectal bleeding  Interventions:  . Advised patient to increase water intake, especially if she is using a fiber supplement.  . Provided education to patient re: OTC medications for constipation treatment and prevention . Reviewed medications with patient and discussed how iron supplements can cause constipation . Discussed plans with patient for ongoing care management follow up and provided patient with direct contact information for care management team . Discussed current treatment methods: o Patient is using "fiber power" in water and enemas as needed . Recommended Mirilax or docusate sodium 100mg  (1 to 3 capsules a day) taken daily for maintenance and prevention . Discussed other OTC options for treatment in addition to above that is used for prevention o Glycerin suppositories, fleets enema. Patient uses these occasionally . Advised to keep appointment with GI in September although rectal bleeding has stopped  Patient Self Care Activities:  . Performs ADL's independently . Performs IADL's independently  Initial goal documentation          Plan:   The care management team will reach out to the patient again over the next 30 days.    Patient will keep appt with Dr Gordy Levan on 02/13/19 for GI eval  Patient will contact Dr Lowanda Foster for refill of iron  [name] BSN, RN-BC [place] / [place] Management Direct Dial: [phone]

## 2019-01-31 NOTE — Patient Instructions (Signed)
Visit Information  Goals Addressed            This Visit's Progress     Patient Stated   . "I need to refill my iron pills" (pt-stated)       Current Barriers:  . Chronic Disease Management support and education needs related to anemia  Nurse Case Manager Clinical Goal(s):  Marland Kitchen Over the next 5 days, patient will talk with Dr Florentina Addison office and request a refill on niferex (iron supplement)  Interventions:  . Reviewed recent labs including CBC . Discussed her need for iron supplements . Discussed side effects of iron . Advised to continue taking iron as directed by provider . Advised to call her pharmacy or Dr Florentina Addison office for refills of Niferex and to let me know if there are any problems with getting a refill  Patient Self Care Activities:  . Performs ADL's independently . Performs IADL's independently  Initial goal documentation     . "I would like to keep from getting constipated" (pt-stated)       Current Barriers:  . Chronic Disease Management support and education needs related to constipation.  Nurse Case Manager Clinical Goal(s):  Marland Kitchen Over the next 30 days, patient will demonstrate improved self-management by experiencing less frequent episodes of constipation.  . Over the next 30 days, patient will utilize OTC treatments and lifestyle modifications to decrease constipation.  . Over the next 30 days, patient will keep all scheduled medical appointments including new patient appointment with gastroenterologist to address recent history of rectal bleeding  Interventions:  . Advised patient to increase water intake, especially if she is using a fiber supplement.  . Provided education to patient re: OTC medications for constipation treatment and prevention . Reviewed medications with patient and discussed how iron supplements can cause constipation . Discussed plans with patient for ongoing care management follow up and provided patient with direct contact  information for care management team . Discussed current treatment methods: o Patient is using "fiber power" in water and enemas as needed . Recommended Mirilax or docusate sodium '100mg'$  (1 to 3 capsules a day) taken daily for maintenance and prevention . Discussed other OTC options for treatment in addition to above that is used for prevention o Glycerin suppositories, fleets enema. Patient uses these occasionally . Advised to keep appointment with GI in September although rectal bleeding has stopped  Patient Self Care Activities:  . Performs ADL's independently . Performs IADL's independently  Initial goal documentation         The patient verbalized understanding of instructions provided today and declined a print copy of patient instruction materials.   The care management team will reach out to the patient again over the next 30 days.    Ms. Coval was given information about Chronic Care Management services today including:  1. CCM service includes personalized support from designated clinical staff supervised by her physician, including individualized plan of care and coordination with other care providers 2. 24/7 contact phone numbers for assistance for urgent and routine care needs. 3. Service will only be billed when office clinical staff spend 20 minutes or more in a month to coordinate care. 4. Only one practitioner may furnish and bill the service in a calendar month. 5. The patient may stop CCM services at any time (effective at the end of the month) by phone call to the office staff. 6. The patient will be responsible for cost sharing (co-pay) of up to 20% of the service  fee (after annual deductible is met).  Patient agreed to services and verbal consent obtained.   [name] BSN, RN-BC Embedded Chronic Care Manager [facility] / [place] Management Direct Dial: [phone]

## 2019-02-05 ENCOUNTER — Other Ambulatory Visit: Payer: Self-pay

## 2019-02-06 ENCOUNTER — Ambulatory Visit (INDEPENDENT_AMBULATORY_CARE_PROVIDER_SITE_OTHER): Payer: Medicare HMO | Admitting: Family Medicine

## 2019-02-06 ENCOUNTER — Encounter: Payer: Self-pay | Admitting: Family Medicine

## 2019-02-06 VITALS — BP 163/69 | HR 61 | Temp 97.1°F | Ht 59.0 in | Wt 133.0 lb

## 2019-02-06 DIAGNOSIS — I10 Essential (primary) hypertension: Secondary | ICD-10-CM

## 2019-02-06 DIAGNOSIS — E782 Mixed hyperlipidemia: Secondary | ICD-10-CM | POA: Diagnosis not present

## 2019-02-06 DIAGNOSIS — Z8673 Personal history of transient ischemic attack (TIA), and cerebral infarction without residual deficits: Secondary | ICD-10-CM | POA: Diagnosis not present

## 2019-02-06 DIAGNOSIS — K5903 Drug induced constipation: Secondary | ICD-10-CM

## 2019-02-06 DIAGNOSIS — I129 Hypertensive chronic kidney disease with stage 1 through stage 4 chronic kidney disease, or unspecified chronic kidney disease: Secondary | ICD-10-CM

## 2019-02-06 DIAGNOSIS — N183 Chronic kidney disease, stage 3 unspecified: Secondary | ICD-10-CM

## 2019-02-06 DIAGNOSIS — Z23 Encounter for immunization: Secondary | ICD-10-CM | POA: Diagnosis not present

## 2019-02-06 DIAGNOSIS — K921 Melena: Secondary | ICD-10-CM | POA: Diagnosis not present

## 2019-02-06 LAB — HEMOGLOBIN, FINGERSTICK: Hemoglobin: 9.6 g/dL — ABNORMAL LOW (ref 11.1–15.9)

## 2019-02-06 NOTE — Progress Notes (Signed)
Subjective: CC: HTN PCP: [name], DO LU:[id number] Amanda [name] is a 85 y.o. female presenting to clinic today for:  1. HTN, HLD, CKD3 History: Nephrologist is Dr. Lowanda Foster in [place]. Patient here for interval follow-up of hypertension, hyperlipidemia and CKD 3.  She reports compliance with Micardis, verapamil and Lipitor.  She did not take her medicines this morning because she wanted to be fasting for labs.  No chest pain, shortness of breath.  Vision is stable.  Urine output normal.  She does not often monitor blood pressures at home but when she does she notes that systolics can sometimes be in the 150s.  She is not sure if she is taking her medicine when she does take her blood pressure though.  2.  GI bleed Patient was seen on 27 July for melena and hematochezia.  She notes that this was an isolated incident and she has not seen further bleeding.  She did discontinue her iron last week because she was having significant constipation.  She is wanting to know if she needs to resume this.  She otherwise notes that she feels well.  No dizziness, loss of consciousness, fatigue.  She denies frank blood in stool.  No abdominal pain.  3.  Skin lesions Patient reports very skin lesion along bilateral forearms and chest.  She notes that a couple of them are crusty and wants to know what they are.  Denies any significant change in size, shape or color.  ROS: Per HPI  Allergies  Allergen Reactions  . Livalo [Pitavastatin] Other (See Comments)    Causes dizziness  . Simvastatin Other (See Comments)    Causes dizziness  . Lisinopril     Hallucinations, resolved on ARB   Past Medical History:  Diagnosis Date  . Allergy   . Anxiety   . Coronary artery disease    LAD 30% followed by 60% stenosis; pressure wire measurement demonstrated no significant gradient; circumflex had 30% stenosis, right coronary artery has 40% stenosed; EF 75-80%  . Diverticulosis   . GERD (gastroesophageal  reflux disease)   . Hyperlipidemia   . Hypertension   . Impaired renal function 04/23/2018  . Stroke [facility]) 2003   With a left MCA occlusion  . TIA (transient ischemic attack)     Current Outpatient Medications:  .  aspirin EC 81 MG tablet, Take 81 mg by mouth daily. Take 2 tablets daily, Disp: , Rfl:  .  atorvastatin (LIPITOR) 20 MG tablet, TAKE 1 TABLET AT BEDTIME, Disp: 90 tablet, Rfl: 0 .  iron polysaccharides (NIFEREX) 150 MG capsule, Take 150 mg by mouth daily., Disp: , Rfl:  .  loratadine (CLARITIN) 10 MG tablet, Take 1 tablet (10 mg total) by mouth daily., Disp: 30 tablet, Rfl: 11 .  Multiple Vitamins-Minerals (PRESERVISION AREDS 2) CAPS, Take 1 capsule by mouth 2 (two) times a day., Disp: 180 capsule, Rfl: 2 .  omeprazole (PRILOSEC) 20 MG capsule, TAKE 1 CAPSULE EVERY DAY, Disp: 90 capsule, Rfl: 0 .  telmisartan (MICARDIS) 80 MG tablet, Take 1 tablet (80 mg total) by mouth daily., Disp: 90 tablet, Rfl: 1 .  verapamil (CALAN-SR) 180 MG CR tablet, TAKE 1 TABLET (180 MG TOTAL) BY MOUTH AT BEDTIME. REPLACES 120MG ., Disp: 90 tablet, Rfl: 1 Social History   Socioeconomic History  . Marital status: Widowed    Spouse name: Not on file  . Number of children: 2  . Years of education: 40  . Highest education level: High school graduate  Occupational History  . 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 Financial resource strain: Not very hard  . Food insecurity    Worry: Never true    Inability: Never true  . Transportation needs    Medical: No    Non-medical: No  Tobacco Use  . Smoking status: Former Smoker    Quit date: 06/06/1985    Years since quitting: 33.6  . Smokeless tobacco: Never Used  . Tobacco comment: Approximately 10-pack-year history  Substance and Sexual Activity  . Alcohol use: No  . Drug use: No  . Sexual activity: Not on file  Lifestyle  . Physical activity    Days per week: 0 days    Minutes per session: 0 min  .  Stress: Only a little  Relationships  . Social connections    Talks on phone: More than three times a week    Gets together: More than three times a week    Attends religious service: More than 4 times per year    Active member of club or organization: Yes    Attends meetings of clubs or organizations: More than 4 times per year    Relationship status: Widowed  . Intimate partner violence    Fear of current or ex partner: No    Emotionally abused: No    Physically abused: No    Forced sexual activity: No  Other Topics Concern  . Not on file  Social History Narrative   Lives in [place] with her son.   Has been widowed for about 3 years.   Family History  Problem Relation Age of Onset  . Cancer Mother        colonoscopy   . Cancer Father        unsure if cancer  --- tumor on brain  . Heart attack Sister   . Heart attack Brother   . Diabetes Brother   . GI problems Son   . COPD Sister   . Hypertension Brother   . Heart disease Brother   . Heart attack Brother   . Heart disease Brother   . Alcohol abuse Brother     Objective: Office vital signs reviewed. BP (!) 163/69   Pulse 61   Temp (!) 97.1 F (36.2 C) (Temporal)   Ht 4\' 11"  (1.499 m)   Wt 133 lb (60.3 kg)   BMI 26.86 kg/m   Physical Examination:  General: Awake, alert, well nourished, No acute distress HEENT: Normal, sclera white, MMM Cardio: regular rate and rhythm, S1S2 heard, no murmurs appreciated Pulm: clear to auscultation bilaterally, no wheezes, rhonchi or rales; normal work of breathing on room air Extremities: warm, well perfused, No edema, cyanosis or clubbing; +2 pulses bilaterally Skin: Several small actinic keratosis noted along bilateral forearms.  She has several sun spots noted along the chest and upper extremities.  She is a Fitzpatrick 1.   Assessment/ Plan: 85 y.o. female   1. Essential hypertension Not controlled.  I suspect this is because she has not taken her medications this  morning.  I would like her to come back in 2 weeks and have the blood pressure checked with the nurse.  I instructed her to take her blood pressure medicine prior to arrival.  She voiced good understanding.  2. CKD (chronic kidney disease) stage 3, GFR 30-59 ml/min (HCC) She is followed by nephrology and had a chemistry done at the end of July  which showed slightly worsening kidney disease.  Her creatinine was up from 1.03-1.14.  3. Mixed hyperlipidemia Continue statin for secondary prevention  4. History of CVA (cerebrovascular accident)  5. Melena Her hemoglobin has dropped with an additional 2 points.  In July it was 11.6.  Today a fingerstick hemoglobin was down to 9.6.  I have advised her to go back on her iron and keep her GI follow-up on 9/9.  We discussed red flag signs and symptoms warranting further evaluation emergency department. - Hemoglobin, fingerstick  6. Drug induced constipation Encourage stool softener and adequate water intake.   Orders Placed This Encounter  Procedures  . Hemoglobin, fingerstick   No orders of the defined types were placed in this encounter.    [name], DO [place] [phone]

## 2019-02-06 NOTE — Patient Instructions (Addendum)
Keep your appointment with the GI doctor for the blood you saw in your stool. You can use Colace to soften stools.  Make sure you drink enough water for the constipation.  Come back in 2 weeks to have your blood pressure checked by the nurse.  Make sure that you take your blood pressure medication before that visit.

## 2019-02-07 ENCOUNTER — Ambulatory Visit: Payer: Medicare HMO | Admitting: Nurse Practitioner

## 2019-02-13 ENCOUNTER — Encounter: Payer: Self-pay | Admitting: *Deleted

## 2019-02-13 ENCOUNTER — Telehealth: Payer: Self-pay | Admitting: *Deleted

## 2019-02-13 ENCOUNTER — Other Ambulatory Visit: Payer: Self-pay | Admitting: *Deleted

## 2019-02-13 ENCOUNTER — Ambulatory Visit (INDEPENDENT_AMBULATORY_CARE_PROVIDER_SITE_OTHER): Payer: Medicare HMO | Admitting: Nurse Practitioner

## 2019-02-13 ENCOUNTER — Encounter: Payer: Self-pay | Admitting: Nurse Practitioner

## 2019-02-13 ENCOUNTER — Other Ambulatory Visit: Payer: Self-pay

## 2019-02-13 ENCOUNTER — Encounter: Payer: Self-pay | Admitting: Gastroenterology

## 2019-02-13 ENCOUNTER — Telehealth: Payer: Self-pay | Admitting: Family Medicine

## 2019-02-13 DIAGNOSIS — I10 Essential (primary) hypertension: Secondary | ICD-10-CM

## 2019-02-13 DIAGNOSIS — K589 Irritable bowel syndrome without diarrhea: Secondary | ICD-10-CM

## 2019-02-13 DIAGNOSIS — K5900 Constipation, unspecified: Secondary | ICD-10-CM | POA: Insufficient documentation

## 2019-02-13 DIAGNOSIS — K921 Melena: Secondary | ICD-10-CM | POA: Insufficient documentation

## 2019-02-13 DIAGNOSIS — D649 Anemia, unspecified: Secondary | ICD-10-CM | POA: Diagnosis not present

## 2019-02-13 MED ORDER — PEG 3350-KCL-NA BICARB-NACL 420 G PO SOLR: 4000.0000 mL | Freq: Once | ORAL | 0 refills | Status: AC

## 2019-02-13 MED ORDER — VERAPAMIL HCL ER 180 MG PO TBCR: EXTENDED_RELEASE_TABLET | ORAL | 0 refills | Status: DC

## 2019-02-13 NOTE — Patient Instructions (Signed)
Your health issues we discussed today were:   Anemia (low blood count) with rectal bleeding and black stools: 1. Because of your family history of colon cancer and your bleeding we will plan for a colonoscopy and upper endoscopy to further evaluate these 2. Further recommendations to follow based on the results of your procedures. 3. As we discussed, if you start having worsening weakness, fatigue, notice a large amount of bleeding proceed to the emergency room or call our office 4. Call us if you have any worsening or severe symptoms  Constipation: 1. Take Colace stool softener once a day, every day 2. Call us in 1 to 2 weeks if this is not improving your constipation symptoms 3. Call us for any worsening or severe symptoms  Overall I recommend:  1. Continue your other current medications 2. Return for follow-up in 4 months 3. Call us if you have any questions or concerns   Because of recent events of COVID-19 ("Coronavirus"), follow CDC recommendations:  1. Wash your hand frequently 2. Avoid touching your face 3. Stay away from people who are sick 4. If you have symptoms such as fever, cough, shortness of breath then call your healthcare provider for further guidance 5. If you are sick, STAY AT HOME unless otherwise directed by your healthcare provider. 6. Follow directions from state and national officials regarding staying safe   At [facility] we value your feedback. You may receive a survey about your visit today. Please share your experience as we strive to create trusting relationships with our patients to provide genuine, compassionate, quality care.  We appreciate your understanding and patience as we review any laboratory studies, imaging, and other diagnostic tests that are ordered as we care for you. Our office policy is 5 business days for review of these results, and any emergent or urgent results are addressed in a timely manner for your best interest.  If you do not hear from our office in 1 week, please contact us.   We also encourage the use of MyChart, which contains your medical information for your review as well. If you are not enrolled in this feature, an access code is on this after visit summary for your convenience. Thank you for allowing [place] to be involved in your care.  It was great to see you today!  I hope you have a great Fall!!

## 2019-02-13 NOTE — Telephone Encounter (Signed)
PA approved for EGD via [employer]. Auth# [id number] dates 04/08/2019-05/08/2019

## 2019-02-13 NOTE — Assessment & Plan Note (Addendum)
The patient described a isolated experience with hematochezia and melena.  Her hemoglobin is typically baseline 13 but a hemoglobin drawn at her first evaluation of this by primary care found to be in the 11 range.  Since then she has had a follow-up with a fingerstick hemoglobin that had declined into the 9 range.  She does have a family history of colon cancer.  Because of hematochezia and family history, last colonoscopy in 2016 we will proceed with a colonoscopy to evaluate given active and ongoing symptoms.  Proceed with colonoscopy with Dr. Oneida Alar in the near future. The risks, benefits, and alternatives have been discussed in detail with the patient. They state understanding and desire to proceed.   The patient is not on any anticoagulants, anxiolytics, chronic pain medications, antidepressants, or antidiabetics.  She does take iron which we will have her hold for 1 week prior to her procedures.

## 2019-02-13 NOTE — Progress Notes (Signed)
Primary Care Physician:  [name], DO Primary Gastroenterologist:  Dr. Oneida Alar  Chief Complaint  Patient presents with   Rectal Bleeding    month ago   Constipation    HPI:   Amanda [name] is a 85 y.o. female who presents on referral from primary care for melena and hematochezia.  Reviewed information provided with the referral including primary care office visit dated 12/31/2018 for melena and hematochezia.  There is a call from the patient and this was apparently an urgent/working appointment.  This is a virtual office visit due to COVID-19/coronavirus pandemic.  Did several days of bright red blood per rectum.  Mother had colon cancer.  She takes iron but has been for many years.  She also reports pitch black stools, denies use of Pepto-Bismol.  Colonoscopy a few years ago and did have polyps and this is apparently completed by Dr. Britta Mccreedy.  Labs ordered including CBC, CMP, iron.  Recommended referral to GI.  Reviewed labs which were drawn 12/31/2018.  Iron normal at 88, CMP with stable elevated creatinine at 1.14, LFTs normal.  CBC completed which found hemoglobin normal at 11.6 though baseline appears to be 12.9-13.9.  Patient had a follow-up primary care visit 02/06/2019 and notes no further bleeding.  She stopped her iron last week due to significant constipation.  Point-of-care hemoglobin fingerstick found hemoglobin at that time to be 9.6 which was a 2 g drop from previous value 1 month prior.  Recommended restart iron and keep GI appointment.  Her precautions were given.  Also recommended a stool softener with adequate water and fiber intake for constipation.  It appears the patient had a colonoscopy 07/16/2014 at [facility], with indications of patient's immediate family history of colon cancer.  This found moderate diverticulosis throughout the entire examined colon, otherwise normal.  Presumed 5-year repeat exam.  Today she states she's doing ok overall. She  states she has not seen any blood or black stools in about a month. Once in a while GERD symptoms, is on Prilosec once daily. Has mid-to-lower abdominal pain, intermittent, crampy. Has a history of IBS for years. Has about 2 stools a day but with straining. Stool softener hasn't helped her constipation and at times has to use an enema; admits she hasn't been taking stool softener in a while. About 2 weeks ago, she started having symptoms when she lays down at night "like she's going to get sick" but self resolved. Denies N/V, fever, chills, unintentional weight loss. Denies URI or flu-like symptoms. Denies loss of sense of taste or smell. Has chronic dizziness x 5 years. Denies any other symptoms of significant anemia.Denies chest pain, dyspnea, lightheadedness, syncope, near syncope. Denies any other upper or lower GI symptoms.  Past Medical History:  Diagnosis Date   Allergy    Anxiety    Coronary artery disease    LAD 30% followed by 60% stenosis; pressure wire measurement demonstrated no significant gradient; circumflex had 30% stenosis, right coronary artery has 40% stenosed; EF 75-80%   Diverticulosis    GERD (gastroesophageal reflux disease)    Hyperlipidemia    Hypertension    Impaired renal function 04/23/2018   Stroke ([place]) 2003   With a left MCA occlusion   TIA (transient ischemic attack)     Past Surgical History:  Procedure Laterality Date   ABDOMINAL HYSTERECTOMY     partial and complete - x  surgeries   APPENDECTOMY     CHOLECYSTECTOMY  EYE SURGERY Bilateral    cataracts   Hysterectomy-type unspecified     TONSILLECTOMY      Current Outpatient Medications  Medication Sig Dispense Refill   aspirin EC 81 MG tablet Take 81 mg by mouth daily.      atorvastatin (LIPITOR) 20 MG tablet TAKE 1 TABLET AT BEDTIME 90 tablet 0   iron polysaccharides (NIFEREX) 150 MG capsule Take 150 mg by mouth daily.     loratadine (CLARITIN) 10 MG tablet Take 1 tablet  (10 mg total) by mouth daily. 30 tablet 11   Multiple Vitamins-Minerals (PRESERVISION AREDS 2) CAPS Take 1 capsule by mouth 2 (two) times a day. 180 capsule 2   omeprazole (PRILOSEC) 20 MG capsule TAKE 1 CAPSULE EVERY DAY 90 capsule 0   telmisartan (MICARDIS) 80 MG tablet Take 1 tablet (80 mg total) by mouth daily. 90 tablet 1   verapamil (CALAN-SR) 180 MG CR tablet TAKE 1 TABLET (180 MG TOTAL) BY MOUTH AT BEDTIME. REPLACES 120MG . 90 tablet 1   No current facility-administered medications for this visit.     Allergies as of 02/13/2019 - Review Complete 02/13/2019  Allergen Reaction Noted   Livalo [pitavastatin] Other (See Comments) 05/19/2012   Simvastatin Other (See Comments) 05/19/2012   Lisinopril  09/14/2017    Family History  Problem Relation Age of Onset   Colon cancer Mother 44       dx on colonoscopy    Cancer Father        unsure if cancer  --- tumor on brain   Heart attack Sister    Heart attack Brother    Diabetes Brother    GI problems Son    COPD Sister    Hypertension Brother    Heart disease Brother    Heart attack Brother    Heart disease Brother    Alcohol abuse Brother     Social History   Socioeconomic History   Marital status: Widowed    Spouse name: Not on file   Number of children: 2   Years of education: 12   Highest education level: High school graduate  Occupational History   Occupation: Horticulturist, commercial: RETIRED    Comment: Retired  Scientist, product/process development strain: Not very hard   Food insecurity    Worry: Never true    Inability: Never true   Transportation needs    Medical: No    Non-medical: No  Tobacco Use   Smoking status: Former Smoker    Quit date: 06/06/1985    Years since quitting: 33.7   Smokeless tobacco: Never Used   Tobacco comment: Approximately 10-pack-year history  Substance and Sexual Activity   Alcohol use: No   Drug use: No   Sexual activity: Not on file  Lifestyle    Physical activity    Days per week: 0 days    Minutes per session: 0 min   Stress: Only a little  Relationships   Social connections    Talks on phone: More than three times a week    Gets together: More than three times a week    Attends religious service: More than 4 times per year    Active member of club or organization: Yes    Attends meetings of clubs or organizations: More than 4 times per year    Relationship status: Widowed   Intimate partner violence    Fear of current or ex partner: No    Emotionally abused:  No    Physically abused: No    Forced sexual activity: No  Other Topics Concern   Not on file  Social History Narrative   Lives in [place] with her son.   Has been widowed for about 3 years.    Review of Systems: Complete ROS negative except as per HPI.    Physical Exam: BP (!) 155/80    Pulse 69    Temp (!) 96.8 F (36 C) (Temporal)    Ht 5' (1.524 m)    Wt 134 lb 9.6 oz (61.1 kg)    BMI 26.29 kg/m  General:   Alert and oriented. Pleasant and cooperative. Well-nourished and well-developed.  Eyes:  Without icterus, sclera clear and conjunctiva pink.  Ears:  Normal auditory acuity. Cardiovascular:  S1, S2 present without murmurs appreciated. Extremities without clubbing or edema. Respiratory:  Clear to auscultation bilaterally. No wheezes, rales, or rhonchi. No distress.  Gastrointestinal:  +BS, soft, non-tender and non-distended. No HSM noted. No guarding or rebound. No masses appreciated.  Rectal:  Deferred  Musculoskalatal:  Symmetrical without gross deformities. Neurologic:  Alert and oriented x4;  grossly normal neurologically. Psych:  Alert and cooperative. Normal mood and affect. Heme/Lymph/Immune: No excessive bruising noted.    02/13/2019 9:44 AM   Disclaimer: This note was dictated with voice recognition software. Similar sounding words can inadvertently be transcribed and may not be corrected upon review.

## 2019-02-13 NOTE — Assessment & Plan Note (Signed)
The patient complains of constipation.  She does have stools about twice a day but not significant straining.  She feels her rectal bleeding was likely due to hemorrhoids related to her constipation.  It appears primary care told her to take Colace stool softener daily, although she states she has not been taking this because she was not told to.  I recommended she take Colace once a day, every day.  Call in 1 to 2 weeks if no improvement in constipation which point we can likely add MiraLAX 1-2 times a day as needed.  Follow-up in 4 months.

## 2019-02-13 NOTE — Telephone Encounter (Signed)
30 day supply sent to [facility]

## 2019-02-13 NOTE — Assessment & Plan Note (Addendum)
Noted anemia with precipitous drop in hemoglobin from 13-range to a 9 range over the course of about 2 months.  She did have isolated frank hematochezia and melena.  She does have a history of anemia and is on iron for this.  The patient is not having any symptoms of significant anemia.  She does have some mild kidney disease but this likely would not result in 2 g drop in hemoglobin.  She does have a family history of colon cancer.  She does have a history of GERD.  Given this we will plan for endoscopic evaluation as per below.  Further recommendations to follow.  Follow-up in 4 months.  ER precautions were given related to bleeding and anemia symptoms.

## 2019-02-13 NOTE — Assessment & Plan Note (Addendum)
The patient was previously having melena.  She is on iron chronically, although her melena has resolved despite the fact that she is on iron.  Seems to be isolated in about 1 month ago.  She is had a significant decline in her hemoglobin.  She does have a history of GERD and is on PPI daily.  At this point we will plan for colonoscopy as well as upper endoscopy to evaluate because of significant anemia and a precipitous drop in her hemoglobin.  Further recommendations to follow.  Proceed with EGD with Dr. Oneida Alar in near future: the risks, benefits, and alternatives have been discussed with the patient in detail. The patient states understanding and desires to proceed.  The patient is not on any anticoagulants, anxiolytics, chronic pain medications, antidepressants, or antidiabetics.  She does take iron which we will have her hold for 1 week prior to her procedures.

## 2019-02-14 ENCOUNTER — Other Ambulatory Visit: Payer: Self-pay | Admitting: Family Medicine

## 2019-02-18 ENCOUNTER — Other Ambulatory Visit: Payer: Self-pay | Admitting: Nurse Practitioner

## 2019-02-18 DIAGNOSIS — N3000 Acute cystitis without hematuria: Secondary | ICD-10-CM

## 2019-02-19 ENCOUNTER — Other Ambulatory Visit: Payer: Self-pay

## 2019-02-20 ENCOUNTER — Ambulatory Visit (INDEPENDENT_AMBULATORY_CARE_PROVIDER_SITE_OTHER): Payer: Medicare HMO | Admitting: Family Medicine

## 2019-02-20 ENCOUNTER — Ambulatory Visit (INDEPENDENT_AMBULATORY_CARE_PROVIDER_SITE_OTHER): Payer: Medicare HMO | Admitting: *Deleted

## 2019-02-20 ENCOUNTER — Encounter: Payer: Self-pay | Admitting: Family Medicine

## 2019-02-20 ENCOUNTER — Telehealth: Payer: Medicare HMO

## 2019-02-20 VITALS — BP 138/65 | HR 66 | Temp 98.3°F | Ht 60.0 in | Wt 134.2 lb

## 2019-02-20 VITALS — BP 138/65 | HR 66

## 2019-02-20 DIAGNOSIS — N3001 Acute cystitis with hematuria: Secondary | ICD-10-CM | POA: Diagnosis not present

## 2019-02-20 DIAGNOSIS — I10 Essential (primary) hypertension: Secondary | ICD-10-CM

## 2019-02-20 DIAGNOSIS — R399 Unspecified symptoms and signs involving the genitourinary system: Secondary | ICD-10-CM | POA: Diagnosis not present

## 2019-02-20 LAB — URINALYSIS, COMPLETE
Bilirubin, UA: NEGATIVE
Glucose, UA: NEGATIVE
Ketones, UA: NEGATIVE
Nitrite, UA: NEGATIVE
Protein,UA: NEGATIVE
Specific Gravity, UA: 1.015 (ref 1.005–1.030)
Urobilinogen, Ur: 0.2 mg/dL (ref 0.2–1.0)
pH, UA: 7 (ref 5.0–7.5)

## 2019-02-20 LAB — MICROSCOPIC EXAMINATION
Renal Epithel, UA: NONE SEEN /hpf
WBC, UA: >30 /hpf — AB (ref 0–5)

## 2019-02-20 MED ORDER — CEPHALEXIN 500 MG PO CAPS: 500.0000 mg | ORAL_CAPSULE | Freq: Two times a day (BID) | ORAL | 0 refills | Status: AC

## 2019-02-20 NOTE — Progress Notes (Signed)
Patient is here today for a 2 week nurse bp recheck. Patient BP 138/65   Pulse 66

## 2019-02-20 NOTE — Progress Notes (Signed)
Subjective: CC: UTi PCP: [name], DO [id number] Amanda [name] is a 85 y.o. female presenting to clinic today for:  1. Urinary symptoms Patient reports onset Friday .  Reports dysuria, urinary frequency, urgency. Denies hematuria, fevers, chills, abdominal pain, nausea, vomiting, back pain, vaginal discharge.  Patient has used cranberry juice and tablets for symptoms.  Patient denies a h/o frequent or recurrent UTIs.      ROS: Per HPI  Allergies  Allergen Reactions  . Livalo [Pitavastatin] Other (See Comments)    Causes dizziness  . Simvastatin Other (See Comments)    Causes dizziness  . Lisinopril     Hallucinations, resolved on ARB   Past Medical History:  Diagnosis Date  . Allergy   . Anxiety   . Coronary artery disease    LAD 30% followed by 60% stenosis; pressure wire measurement demonstrated no significant gradient; circumflex had 30% stenosis, right coronary artery has 40% stenosed; EF 75-80%  . Diverticulosis   . GERD (gastroesophageal reflux disease)   . Hyperlipidemia   . Hypertension   . Impaired renal function 04/23/2018  . Stroke [facility]) 2003   With a left MCA occlusion  . TIA (transient ischemic attack)     Current Outpatient Medications:  .  aspirin EC 81 MG tablet, Take 81 mg by mouth daily. , Disp: , Rfl:  .  atorvastatin (LIPITOR) 20 MG tablet, TAKE 1 TABLET AT BEDTIME, Disp: 90 tablet, Rfl: 0 .  iron polysaccharides (NIFEREX) 150 MG capsule, Take 150 mg by mouth daily., Disp: , Rfl:  .  loratadine (CLARITIN) 10 MG tablet, Take 1 tablet (10 mg total) by mouth daily., Disp: 30 tablet, Rfl: 11 .  Multiple Vitamins-Minerals (PRESERVISION AREDS 2) CAPS, Take 1 capsule by mouth 2 (two) times a day., Disp: 180 capsule, Rfl: 2 .  omeprazole (PRILOSEC) 20 MG capsule, TAKE 1 CAPSULE EVERY DAY, Disp: 90 capsule, Rfl: 0 .  telmisartan (MICARDIS) 80 MG tablet, Take 1 tablet (80 mg total) by mouth daily., Disp: 90 tablet, Rfl: 1 .  verapamil (CALAN-SR) 180 MG CR  tablet, TAKE 1 TABLET (180 MG TOTAL) BY MOUTH AT BEDTIME. REPLACES 120MG ., Disp: 30 tablet, Rfl: 0 Social History   Socioeconomic History  . Marital status: Widowed    Spouse name: Not on file  . Number of children: 2  . Years of education: 67  . Highest education level: High school graduate  Occupational History  . 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 Financial resource strain: Not very hard  . Food insecurity    Worry: Never true    Inability: Never true  . Transportation needs    Medical: No    Non-medical: No  Tobacco Use  . Smoking status: Former Smoker    Quit date: 06/06/1985    Years since quitting: 33.7  . Smokeless tobacco: Never Used  . Tobacco comment: Approximately 10-pack-year history  Substance and Sexual Activity  . Alcohol use: No  . Drug use: No  . Sexual activity: Not on file  Lifestyle  . Physical activity    Days per week: 0 days    Minutes per session: 0 min  . Stress: Only a little  Relationships  . Social connections    Talks on phone: More than three times a week    Gets together: More than three times a week    Attends religious service: More than 4 times per year  Active member of club or organization: Yes    Attends meetings of clubs or organizations: More than 4 times per year    Relationship status: Widowed  . Intimate partner violence    Fear of current or ex partner: No    Emotionally abused: No    Physically abused: No    Forced sexual activity: No  Other Topics Concern  . Not on file  Social History Narrative   Lives in [place] with her son.   Has been widowed for about 3 years.   Family History  Problem Relation Age of Onset  . Colon cancer Mother 54       dx on colonoscopy   . Cancer Father        unsure if cancer  --- tumor on brain  . Heart attack Sister   . Heart attack Brother   . Diabetes Brother   . GI problems Son   . COPD Sister   . Hypertension Brother   . Heart  disease Brother   . Heart attack Brother   . Heart disease Brother   . Alcohol abuse Brother     Objective: Office vital signs reviewed. BP 138/65   Pulse 66   Temp 98.3 F (36.8 C) (Temporal)   Ht 5' (1.524 m)   Wt 134 lb 3.2 oz (60.9 kg)   SpO2 96%   BMI 26.21 kg/m   Physical Examination:  General: Awake, alert, well nourished, No acute distress GU: No suprapubic tenderness palpation.  No CVA tenderness palpation.  Assessment/ Plan: 85 y.o. female   1. Acute cystitis with hematuria Home care instructions reviewed and reasons for return discussed.  Follow-up PRN - Urinalysis, Complete - Urine Culture - cephALEXin (KEFLEX) 500 MG capsule; Take 1 capsule (500 mg total) by mouth 2 (two) times daily for 7 days.  Dispense: 14 capsule; Refill: 0   Orders Placed This Encounter  Procedures  . Urine Culture  . Urinalysis, Complete   No orders of the defined types were placed in this encounter.    [name], DO [name] [phone]

## 2019-02-20 NOTE — Patient Instructions (Signed)
Urinary Tract Infection, Adult A urinary tract infection (UTI) is an infection of any part of the urinary tract. The urinary tract includes:  The kidneys.  The ureters.  The bladder.  The urethra. These organs make, store, and get rid of pee (urine) in the body. What are the causes? This is caused by germs (bacteria) in your genital area. These germs grow and cause swelling (inflammation) of your urinary tract. What increases the risk? You are more likely to develop this condition if:  You have a small, thin tube (catheter) to drain pee.  You cannot control when you pee or poop (incontinence).  You are female, and: ? You use these methods to prevent pregnancy: ? A medicine that kills sperm (spermicide). ? A device that blocks sperm (diaphragm). ? You have low levels of a female hormone (estrogen). ? You are pregnant.  You have genes that add to your risk.  You are sexually active.  You take antibiotic medicines.  You have trouble peeing because of: ? A prostate that is bigger than normal, if you are female. ? A blockage in the part of your body that drains pee from the bladder (urethra). ? A kidney stone. ? A nerve condition that affects your bladder (neurogenic bladder). ? Not getting enough to drink. ? Not peeing often enough.  You have other conditions, such as: ? Diabetes. ? A weak disease-fighting system (immune system). ? Sickle cell disease. ? Gout. ? Injury of the spine. What are the signs or symptoms? Symptoms of this condition include:  Needing to pee right away (urgently).  Peeing often.  Peeing small amounts often.  Pain or burning when peeing.  Blood in the pee.  Pee that smells bad or not like normal.  Trouble peeing.  Pee that is cloudy.  Fluid coming from the vagina, if you are female.  Pain in the belly or lower back. Other symptoms include:  Throwing up (vomiting).  No urge to eat.  Feeling mixed up (confused).  Being tired  and grouchy (irritable).  A fever.  Watery poop (diarrhea). How is this treated? This condition may be treated with:  Antibiotic medicine.  Other medicines.  Drinking enough water. Follow these instructions at home:  Medicines  Take over-the-counter and prescription medicines only as told by your doctor.  If you were prescribed an antibiotic medicine, take it as told by your doctor. Do not stop taking it even if you start to feel better. General instructions  Make sure you: ? Pee until your bladder is empty. ? Do not hold pee for a long time. ? Empty your bladder after sex. ? Wipe from front to back after pooping if you are a female. Use each tissue one time when you wipe.  Drink enough fluid to keep your pee pale yellow.  Keep all follow-up visits as told by your doctor. This is important. Contact a doctor if:  You do not get better after 1-2 days.  Your symptoms go away and then come back. Get help right away if:  You have very bad back pain.  You have very bad pain in your lower belly.  You have a fever.  You are sick to your stomach (nauseous).  You are throwing up. Summary  A urinary tract infection (UTI) is an infection of any part of the urinary tract.  This condition is caused by germs in your genital area.  There are many risk factors for a UTI. These include having a small, thin   tube to drain pee and not being able to control when you pee or poop.  Treatment includes antibiotic medicines for germs.  Drink enough fluid to keep your pee pale yellow. This information is not intended to replace advice given to you by your health care provider. Make sure you discuss any questions you have with your health care provider. Document Released: 11/09/2007 Document Revised: 05/10/2018 Document Reviewed: 11/30/2017 Elsevier Patient Education  2020 Elsevier Inc.  

## 2019-02-21 ENCOUNTER — Telehealth: Payer: Self-pay

## 2019-02-21 NOTE — Telephone Encounter (Signed)
Called pt to reschedule TCS/EGD w/SLF that was for 04/08/19 d/t SLF will not be available 04/08/19. Procedure rescheduled to 05/10/19 at 9:45am. Pt is aware we will call her to move procedure up sooner if someone cancels. COVID test rescheduled to 05/08/19 at 9:00am. Endo scheduler informed. New appt letter and procedure instructions mailed.

## 2019-02-21 NOTE — Telephone Encounter (Signed)
Date of service for procedure updated on [employer] website for PA of EGD. PA# [id number], valid 05/10/19-06/09/19.

## 2019-02-22 ENCOUNTER — Ambulatory Visit: Payer: Medicare HMO | Admitting: *Deleted

## 2019-02-22 DIAGNOSIS — I10 Essential (primary) hypertension: Secondary | ICD-10-CM

## 2019-02-22 DIAGNOSIS — D508 Other iron deficiency anemias: Secondary | ICD-10-CM

## 2019-02-22 DIAGNOSIS — K5903 Drug induced constipation: Secondary | ICD-10-CM

## 2019-02-22 LAB — URINE CULTURE

## 2019-02-22 NOTE — Patient Instructions (Signed)
    Follow up plan: A HIPPA compliant phone message was left for the patient providing contact information and requesting a return call.  The care management team will reach out to the patient again over the next 7 days.   [name], BSN, RN-BC Embedded Chronic Care Manager [facility] / [place] Management Direct Dial: [phone]

## 2019-02-22 NOTE — Chronic Care Management (AMB) (Signed)
  Chronic Care Management   Follow-Up Note  02/22/2019 Name: Amanda [name] MRN: [id number] DOB: 06/21/1930  I reached out to Ms Schlanger today to follow-up on our recent CCM visit but was unable to speak with her. Chart review was performed in preparation for today's call. Of note, her most recent blood pressure reading was WNL. Previous readings were elevated.   Goals Addressed            This Visit's Progress     Patient Stated   . "I would like to keep from getting constipated" (pt-stated)       Current Barriers:  . Chronic Disease Management support and education needs related to constipation.  Nurse Case Manager Clinical Goal(s):  Marland Kitchen Over the next 30 days, patient will demonstrate improved self-management by experiencing less frequent episodes of constipation.  . Over the next 30 days, patient will utilize OTC treatments and lifestyle modifications to decrease constipation.   Interventions:  . Chart reviewed o Virtual visit last week with Dr Gordy Levan. He recommended colace as well for constipation and mentioned that they may add Miralax if necessary o Scheduled for colonoscopy in December. Will be put on a cancellation list to work-in sooner if a spot opens up . Reached out to patient by telephone but had to leave a message .  RN CCM will reach back out next week  Patient Self Care Activities:  . Performs ADL's independently . Performs IADL's independently  Please see past updates related to this goal by clicking on the "Past Updates" button in the selected goal           Follow up plan: A HIPPA compliant phone message was left for the patient providing contact information and requesting a return call.  The care management team will reach out to the patient again over the next 7 days.   [name], BSN, RN-BC Embedded Chronic Care Manager [facility] / [place] [facility] Direct Dial: [phone]

## 2019-02-25 ENCOUNTER — Other Ambulatory Visit: Payer: Self-pay | Admitting: Family Medicine

## 2019-02-26 ENCOUNTER — Telehealth: Payer: Self-pay | Admitting: *Deleted

## 2019-02-26 ENCOUNTER — Other Ambulatory Visit: Payer: Self-pay | Admitting: *Deleted

## 2019-02-26 NOTE — Telephone Encounter (Signed)
Called patient. We have moved procedure up to 11/13 at 10:30am. Patient aware will mail new prep ionstructions. She is scheduled for new covid testing appt for 11/11 at 9:05am. Called endo and aware of appt change.   humana PA updated for EGD. [id number] dates 04/19/2019-05/19/2019

## 2019-03-06 ENCOUNTER — Ambulatory Visit (INDEPENDENT_AMBULATORY_CARE_PROVIDER_SITE_OTHER): Payer: Medicare HMO | Admitting: Family Medicine

## 2019-03-06 ENCOUNTER — Encounter: Payer: Self-pay | Admitting: Family Medicine

## 2019-03-06 DIAGNOSIS — J329 Chronic sinusitis, unspecified: Secondary | ICD-10-CM

## 2019-03-06 DIAGNOSIS — J40 Bronchitis, not specified as acute or chronic: Secondary | ICD-10-CM | POA: Diagnosis not present

## 2019-03-06 MED ORDER — HYDROCODONE-HOMATROPINE 5-1.5 MG/5ML PO SYRP: 5.0000 mL | ORAL_SOLUTION | Freq: Four times a day (QID) | ORAL | 0 refills | Status: AC | PRN

## 2019-03-06 MED ORDER — AZITHROMYCIN 250 MG PO TABS: ORAL_TABLET | ORAL | 0 refills | Status: DC

## 2019-03-06 NOTE — Progress Notes (Signed)
    Subjective:    Patient ID: Amanda [name], female    DOB: 23-Dec-1930, 85 y.o.   MRN: [id number]   HPI: Amanda [name] is a 85 y.o. female presenting for coughed all night. Couldn't sleep. Couldn't lay down. Walked from room to room all night long. Deep cough. Dry. Denies dyspnea. Denies fever. Couldn't speak, voice was gone. Having rhinorrhea and PND as well as UR congestion.   Depression screen [facility] 2/9 02/06/2019 01/29/2019 11/06/2018 08/14/2018 07/20/2018  Decreased Interest 1 1 0 0 0  Down, Depressed, Hopeless 1 0 0 0 0  PHQ - 2 Score 2 1 0 0 0  Altered sleeping 0 - 0 - 0  Tired, decreased energy 2 - 0 - 0  Change in appetite - - 0 - 0  Feeling bad or failure about yourself  0 - 0 - 0  Trouble concentrating 1 - 0 - 0  Moving slowly or fidgety/restless 0 - 0 - 0  Suicidal thoughts 0 - 0 - 0  PHQ-9 Score 5 - 0 - 0  Difficult doing work/chores Not difficult at all - - - -  Some recent data might be hidden     Relevant past medical, surgical, family and social history reviewed and updated as indicated.  Interim medical history since our last visit reviewed. Allergies and medications reviewed and updated.  ROS:  Review of Systems  Constitutional: Negative.   HENT: Positive for congestion and sore throat.   Eyes: Negative for visual disturbance.  Respiratory: Positive for cough. Negative for shortness of breath.   Cardiovascular: Negative for chest pain.  Gastrointestinal: Negative for abdominal pain.  Musculoskeletal: Negative for arthralgias.     Social History   Tobacco Use  Smoking Status Former Smoker  . Quit date: 06/06/1985  . Years since quitting: 33.7  Smokeless Tobacco Never Used  Tobacco Comment   Approximately 10-pack-year history       Objective:     Wt Readings from Last 3 Encounters:  02/20/19 134 lb 3.2 oz (60.9 kg)  02/13/19 134 lb 9.6 oz (61.1 kg)  02/06/19 133 lb (60.3 kg)     Exam deferred. Pt. Harboring due to COVID 19. Phone visit performed.    Assessment & Plan:  No diagnosis found.  No orders of the defined types were placed in this encounter.   No orders of the defined types were placed in this encounter.     There are no diagnoses linked to this encounter.  Virtual Visit via telephone Note  I discussed the limitations, risks, security and privacy concerns of performing an evaluation and management service by telephone and the availability of in person appointments. The patient was identified with two identifiers. Pt.expressed understanding and agreed to proceed. Pt. Is at home. Dr. Livia Snellen is in his office.  Follow Up Instructions:   I discussed the assessment and treatment plan with the patient. The patient was provided an opportunity to ask questions and all were answered. The patient agreed with the plan and demonstrated an understanding of the instructions.   The patient was advised to call back or seek an in-person evaluation if the symptoms worsen or if the condition fails to improve as anticipated.   Total minutes including chart review and phone contact time: 12   Follow up plan: No follow-ups on file.  Claretta Fraise, MD [place]

## 2019-03-29 ENCOUNTER — Other Ambulatory Visit: Payer: Self-pay | Admitting: Family Medicine

## 2019-03-29 DIAGNOSIS — H6591 Unspecified nonsuppurative otitis media, right ear: Secondary | ICD-10-CM

## 2019-04-05 ENCOUNTER — Other Ambulatory Visit (HOSPITAL_COMMUNITY): Payer: Medicare HMO

## 2019-04-15 ENCOUNTER — Other Ambulatory Visit: Payer: Self-pay | Admitting: Family Medicine

## 2019-04-17 ENCOUNTER — Other Ambulatory Visit (HOSPITAL_COMMUNITY)
Admission: RE | Admit: 2019-04-17 | Discharge: 2019-04-17 | Disposition: A | Discharge status: Home / Self Care | Payer: Medicare HMO | Source: Ambulatory Visit | Attending: Gastroenterology | Admitting: Gastroenterology

## 2019-04-17 ENCOUNTER — Other Ambulatory Visit: Payer: Self-pay

## 2019-04-17 DIAGNOSIS — Z01812 Encounter for preprocedural laboratory examination: Secondary | ICD-10-CM | POA: Insufficient documentation

## 2019-04-17 DIAGNOSIS — Z20828 Contact with and (suspected) exposure to other viral communicable diseases: Secondary | ICD-10-CM | POA: Insufficient documentation

## 2019-04-17 LAB — SARS CORONAVIRUS 2 (TAT 6-24 HRS): SARS Coronavirus 2: NEGATIVE

## 2019-04-19 ENCOUNTER — Ambulatory Visit (HOSPITAL_COMMUNITY)
Admission: RE | Admit: 2019-04-19 | Discharge: 2019-04-19 | Disposition: A | Discharge status: Home / Self Care | Payer: Medicare HMO | Source: Ambulatory Visit | Attending: Gastroenterology | Admitting: Gastroenterology

## 2019-04-19 ENCOUNTER — Encounter (HOSPITAL_COMMUNITY)
Admission: RE | Disposition: A | Discharge status: Home / Self Care | Payer: Self-pay | Source: Ambulatory Visit | Attending: Gastroenterology

## 2019-04-19 ENCOUNTER — Encounter (HOSPITAL_COMMUNITY): Payer: Self-pay | Admitting: *Deleted

## 2019-04-19 ENCOUNTER — Other Ambulatory Visit: Payer: Self-pay

## 2019-04-19 DIAGNOSIS — Z888 Allergy status to other drugs, medicaments and biological substances status: Secondary | ICD-10-CM | POA: Diagnosis not present

## 2019-04-19 DIAGNOSIS — K449 Diaphragmatic hernia without obstruction or gangrene: Secondary | ICD-10-CM | POA: Insufficient documentation

## 2019-04-19 DIAGNOSIS — Z7982 Long term (current) use of aspirin: Secondary | ICD-10-CM | POA: Diagnosis not present

## 2019-04-19 DIAGNOSIS — I2510 Atherosclerotic heart disease of native coronary artery without angina pectoris: Secondary | ICD-10-CM | POA: Diagnosis not present

## 2019-04-19 DIAGNOSIS — K644 Residual hemorrhoidal skin tags: Secondary | ICD-10-CM | POA: Insufficient documentation

## 2019-04-19 DIAGNOSIS — Z87891 Personal history of nicotine dependence: Secondary | ICD-10-CM | POA: Diagnosis not present

## 2019-04-19 DIAGNOSIS — K317 Polyp of stomach and duodenum: Secondary | ICD-10-CM | POA: Diagnosis not present

## 2019-04-19 DIAGNOSIS — I10 Essential (primary) hypertension: Secondary | ICD-10-CM | POA: Diagnosis not present

## 2019-04-19 DIAGNOSIS — D122 Benign neoplasm of ascending colon: Secondary | ICD-10-CM | POA: Insufficient documentation

## 2019-04-19 DIAGNOSIS — R1310 Dysphagia, unspecified: Secondary | ICD-10-CM | POA: Insufficient documentation

## 2019-04-19 DIAGNOSIS — K222 Esophageal obstruction: Secondary | ICD-10-CM

## 2019-04-19 DIAGNOSIS — K921 Melena: Secondary | ICD-10-CM | POA: Insufficient documentation

## 2019-04-19 DIAGNOSIS — K648 Other hemorrhoids: Secondary | ICD-10-CM | POA: Diagnosis not present

## 2019-04-19 DIAGNOSIS — K635 Polyp of colon: Secondary | ICD-10-CM | POA: Diagnosis not present

## 2019-04-19 DIAGNOSIS — K219 Gastro-esophageal reflux disease without esophagitis: Secondary | ICD-10-CM | POA: Diagnosis not present

## 2019-04-19 DIAGNOSIS — D123 Benign neoplasm of transverse colon: Secondary | ICD-10-CM | POA: Insufficient documentation

## 2019-04-19 DIAGNOSIS — E785 Hyperlipidemia, unspecified: Secondary | ICD-10-CM | POA: Diagnosis not present

## 2019-04-19 DIAGNOSIS — Z79899 Other long term (current) drug therapy: Secondary | ICD-10-CM | POA: Diagnosis not present

## 2019-04-19 DIAGNOSIS — Z8673 Personal history of transient ischemic attack (TIA), and cerebral infarction without residual deficits: Secondary | ICD-10-CM | POA: Diagnosis not present

## 2019-04-19 DIAGNOSIS — K5710 Diverticulosis of small intestine without perforation or abscess without bleeding: Secondary | ICD-10-CM | POA: Diagnosis not present

## 2019-04-19 DIAGNOSIS — Q438 Other specified congenital malformations of intestine: Secondary | ICD-10-CM | POA: Diagnosis not present

## 2019-04-19 DIAGNOSIS — D120 Benign neoplasm of cecum: Secondary | ICD-10-CM | POA: Insufficient documentation

## 2019-04-19 DIAGNOSIS — K589 Irritable bowel syndrome without diarrhea: Secondary | ICD-10-CM

## 2019-04-19 DIAGNOSIS — K5730 Diverticulosis of large intestine without perforation or abscess without bleeding: Secondary | ICD-10-CM | POA: Diagnosis not present

## 2019-04-19 HISTORY — PX: ESOPHAGOGASTRODUODENOSCOPY: SHX5428

## 2019-04-19 HISTORY — PX: COLONOSCOPY: SHX5424

## 2019-04-19 HISTORY — PX: POLYPECTOMY: SHX5525

## 2019-04-19 SURGERY — COLONOSCOPY
Anesthesia: Moderate Sedation

## 2019-04-19 MED ORDER — MEPERIDINE HCL 100 MG/ML IJ SOLN
INTRAMUSCULAR | Status: DC | PRN
  Administered 2019-04-19 (×2): 25 mg

## 2019-04-19 MED ORDER — MIDAZOLAM HCL 5 MG/5ML IJ SOLN
INTRAMUSCULAR | Status: AC
  Filled 2019-04-19: qty 10

## 2019-04-19 MED ORDER — MEPERIDINE HCL 100 MG/ML IJ SOLN
INTRAMUSCULAR | Status: AC
  Filled 2019-04-19: qty 2

## 2019-04-19 MED ORDER — LIDOCAINE VISCOUS HCL 2 % MT SOLN
OROMUCOSAL | Status: AC
  Filled 2019-04-19: qty 15

## 2019-04-19 MED ORDER — STERILE WATER FOR IRRIGATION IR SOLN
Status: DC | PRN
  Administered 2019-04-19: 1.5 mL

## 2019-04-19 MED ORDER — SODIUM CHLORIDE 0.9 % IV SOLN
INTRAVENOUS | Status: DC
  Administered 2019-04-19: 09:00:00 1000 mL via INTRAVENOUS

## 2019-04-19 MED ORDER — OMEPRAZOLE 20 MG PO CPDR: 20.0000 mg | DELAYED_RELEASE_CAPSULE | Freq: Every day | ORAL | 3 refills | Status: DC

## 2019-04-19 MED ORDER — LIDOCAINE VISCOUS HCL 2 % MT SOLN
OROMUCOSAL | Status: DC | PRN
  Administered 2019-04-19: 1 via OROMUCOSAL

## 2019-04-19 MED ORDER — MIDAZOLAM HCL 5 MG/5ML IJ SOLN
INTRAMUSCULAR | Status: DC | PRN
  Administered 2019-04-19 (×2): 2 mg via INTRAVENOUS

## 2019-04-19 NOTE — H&P (Addendum)
Primary Care Physician:  [name], DO Primary Gastroenterologist:  Dr. Oneida Alar  Pre-Procedure History & Physical: HPI:  Amanda [name] is a 85 y.o. female here for BRBPR/MELENA/DYSPHAGIA.  Past Medical History:  Diagnosis Date  . Allergy   . Anxiety   . Coronary artery disease    LAD 30% followed by 60% stenosis; pressure wire measurement demonstrated no significant gradient; circumflex had 30% stenosis, right coronary artery has 40% stenosed; EF 75-80%  . Diverticulosis   . GERD (gastroesophageal reflux disease)   . Hyperlipidemia   . Hypertension   . Impaired renal function 04/23/2018  . Stroke [facility]) 2003   With a left MCA occlusion  . TIA (transient ischemic attack)     Past Surgical History:  Procedure Laterality Date  . ABDOMINAL HYSTERECTOMY     partial and complete - x  surgeries  . APPENDECTOMY    . CHOLECYSTECTOMY    . EYE SURGERY Bilateral    cataracts  . Hysterectomy-type unspecified    . TONSILLECTOMY      Prior to Admission medications   Medication Sig Start Date End Date Taking? Authorizing Provider  aspirin EC 81 MG tablet Take 81 mg by mouth daily.    Yes [provider]  iron polysaccharides (NIFEREX) 150 MG capsule Take 150 mg by mouth daily.   Yes [provider]  loratadine (CLARITIN) 10 MG tablet Take 1 tablet (10 mg total) by mouth daily. 03/29/19  Yes Rakes, Connye Burkitt, FNP  Multiple Vitamins-Minerals (PRESERVISION AREDS 2) CAPS Take 1 capsule by mouth 2 (two) times a day. 11/06/18  Yes Gottschalk, Ashly M, DO  omeprazole (PRILOSEC) 20 MG capsule TAKE 1 CAPSULE EVERY DAY 04/15/19  Yes Ronnie Doss M, DO  telmisartan (MICARDIS) 80 MG tablet TAKE 1 TABLET EVERY DAY 02/26/19  Yes Gottschalk, Ashly M, DO  verapamil (CALAN-SR) 180 MG CR tablet TAKE 1 TABLET (180 MG TOTAL) BY MOUTH AT BEDTIME. REPLACES 120MG . 02/13/19  Yes Gottschalk, Ashly M, DO  atorvastatin (LIPITOR) 20 MG tablet TAKE 1 TABLET EVERY NIGHT AT BEDTIME 04/15/19    Ronnie Doss M, DO    Allergies as of 02/13/2019 - Review Complete 02/13/2019  Allergen Reaction Noted  . Livalo [pitavastatin] Other (See Comments) 05/19/2012  . Simvastatin Other (See Comments) 05/19/2012  . Lisinopril  09/14/2017    Family History  Problem Relation Age of Onset  . Colon cancer Mother 70       dx on colonoscopy   . Cancer Father        unsure if cancer  --- tumor on brain  . Heart attack Sister   . Heart attack Brother   . Diabetes Brother   . GI problems Son   . COPD Sister   . Hypertension Brother   . Heart disease Brother   . Heart attack Brother   . Heart disease Brother   . Alcohol abuse Brother     Social History   Socioeconomic History  . Marital status: Widowed    Spouse name: Not on file  . Number of children: 2  . Years of education: 27  . Highest education level: High school graduate  Occupational History  . 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 Financial resource strain: Not very hard  . Food insecurity    Worry: Never true    Inability: Never true  . Transportation needs    Medical: No    Non-medical:  No  Tobacco Use  . Smoking status: Former Smoker    Quit date: 06/06/1985    Years since quitting: 33.8  . Smokeless tobacco: Never Used  . Tobacco comment: Approximately 10-pack-year history  Substance and Sexual Activity  . Alcohol use: No  . Drug use: No  . Sexual activity: Not on file  Lifestyle  . Physical activity    Days per week: 0 days    Minutes per session: 0 min  . Stress: Only a little  Relationships  . Social connections    Talks on phone: More than three times a week    Gets together: More than three times a week    Attends religious service: More than 4 times per year    Active member of club or organization: Yes    Attends meetings of clubs or organizations: More than 4 times per year    Relationship status: Widowed  . Intimate partner violence    Fear of  current or ex partner: No    Emotionally abused: No    Physically abused: No    Forced sexual activity: No  Other Topics Concern  . Not on file  Social History Narrative   Lives in [place] with her son.   Has been widowed for about 3 years.    Review of Systems: See HPI, otherwise negative ROS   Physical Exam: BP (!) 159/73   Pulse 77   Temp 97.8 F (36.6 C) (Axillary)   Resp 16   Ht 5' (1.524 m)   Wt 59 kg   SpO2 96%   BMI 25.39 kg/m  General:   Alert,  pleasant and cooperative in NAD Head:  Normocephalic and atraumatic. Neck:  Supple; Lungs:  Clear throughout to auscultation.    Heart:  Regular rate and rhythm. Abdomen:  Soft, nontender and nondistended. Normal bowel sounds, without guarding, and without rebound.   Neurologic:  Alert and  oriented x4;  grossly normal neurologically.  Impression/Plan:     BRBPR/MELENA/DYSPHAGIA  PLAN: EGD/?DILATION/TCS TODAY. DISCUSSED PROCEDURE, BENEFITS, & RISKS: < 1% chance of medication reaction, bleeding, perforation, ASPIRATION, or rupture of spleen/liver requiring surgery to fix it and missed polyps < 1 cm 10-20% of the time.

## 2019-04-19 NOTE — Op Note (Signed)
[facility] Patient Name: Amanda [name] Procedure Date: 04/19/2019 10:10 AM MRN: [id number] Date of Birth: 1931/04/19 Attending MD: Barney Drain MD, MD CSN: [id number] Age: 85 Admit Type: Outpatient Procedure:                Upper GI endoscopy with COLD FORCEPS                            BIOPSY/ESOPHAGEAL DILATION Indications:              Dysphagia, Melena, PMHX: CVA. ON ASA DAILY. Providers:                Barney Drain MD, MD, Gerome Sam, RN, Aram Candela Referring MD:             Koleen Distance. Gottschalk Medicines:                TCS + Meperidine 25 mg IV, Midazolam 1 mg IV Complications:            No immediate complications. Estimated Blood Loss:     Estimated blood loss was minimal. Procedure:                Pre-Anesthesia Assessment:                           - Prior to the procedure, a History and Physical                            was performed, and patient medications and                            allergies were reviewed. The patient's tolerance of                            previous anesthesia was also reviewed. The risks                            and benefits of the procedure and the sedation                            options and risks were discussed with the patient.                            All questions were answered, and informed consent                            was obtained. Prior Anticoagulants: The patient has                            taken no previous anticoagulant or antiplatelet                            agents except for aspirin. ASA Grade Assessment: II                            -  A patient with mild systemic disease. After                            reviewing the risks and benefits, the patient was                            deemed in satisfactory condition to undergo the                            procedure. After obtaining informed consent, the                            endoscope was passed under direct vision.                         Throughout the procedure, the patient's blood                            pressure, pulse, and oxygen saturations were                            monitored continuously. The GIF-H190 [id number]                            scope was introduced through the mouth, and                            advanced to the second part of duodenum. The upper                            GI endoscopy was accomplished without difficulty.                            The patient tolerated the procedure well. Scope In: 11:10:42 AM Scope Out: 11:22:44 AM Total Procedure Duration: 0 hours 12 minutes 2 seconds  Findings:      A mild Schatzki ring was found at the gastroesophageal junction. A       guidewire was placed and the scope was withdrawn. Dilation was performed       with a Savary dilator with mild resistance at 16 mm and 17 mm.      A small hiatal hernia was present.      Multiple small sessile polyps with no bleeding and no stigmata of recent       bleeding were found in the gastric fundus, in the gastric body and in       the gastric antrum. Biopsies were taken with a cold forceps for       histology.      The duodenal bulb was normal. Biopsies for histology were taken with a       cold forceps for evaluation of celiac disease.      A medium non-bleeding diverticulum was found in the second portion of       the duodenum.      Normal mucosa was found in the second portion of the duodenum. Biopsies       for histology were taken with a cold forceps  for evaluation of celiac       disease. Impression:               - Mild Schatzki ring. Dilated.                           - Small hiatal hernia.                           - Multiple gastric polyps. Biopsied.                           - Non-bleeding duodenal diverticulum.                           - NO OBVIOUS SOURCE FOR MELENA/ANEMIA IDENTIFIED Moderate Sedation:      Moderate (conscious) sedation was administered by the endoscopy nurse        and supervised by the endoscopist. The following parameters were       monitored: oxygen saturation, heart rate, blood pressure, and response       to care. Total physician intraservice time was 55 minutes. Recommendation:           - Patient has a contact number available for                            emergencies. The signs and symptoms of potential                            delayed complications were discussed with the                            patient. Return to normal activities tomorrow.                            Written discharge instructions were provided to the                            patient.                           - Resume previous diet.                           - Continue present medications. OMEPRAZOLE DAILY.                           - Await pathology results.                           - Return to GI office in 4 months. Procedure Code(s):        --- Professional ---                           [phone], Esophagogastroduodenoscopy, flexible,                            transoral; with insertion of guide wire followed by  passage of dilator(s) through esophagus over guide                            wire                           43239, 59, Esophagogastroduodenoscopy, flexible,                            transoral; with biopsy, single or multiple                           99153, Moderate sedation; each additional 15                            minutes intraservice time                           99153, Moderate sedation; each additional 15                            minutes intraservice time                           99153, Moderate sedation; each additional 15                            minutes intraservice time                           G0500, Moderate sedation services provided by the                            same physician or other qualified health care                            professional performing a gastrointestinal                             endoscopic service that sedation supports,                            requiring the presence of an independent trained                            observer to assist in the monitoring of the                            patient's level of consciousness and physiological                            status; initial 15 minutes of intra-service time;                            patient age 54 years or older (additional time 68  be reported with [phone], as appropriate) Diagnosis Code(s):        --- Professional ---                           K22.2, Esophageal obstruction                           K44.9, Diaphragmatic hernia without obstruction or                            gangrene                           K31.7, Polyp of stomach and duodenum                           R13.10, Dysphagia, unspecified                           K92.1, Melena (includes Hematochezia)                           K57.10, Diverticulosis of small intestine without                            perforation or abscess without bleeding CPT copyright 2019 [employer]. All rights reserved. The codes documented in this report are preliminary and upon coder review may  be revised to meet current compliance requirements. Barney Drain, MD Barney Drain MD, MD 04/19/2019 11:47:26 AM This report has been signed electronically. Number of Addenda: 0

## 2019-04-19 NOTE — Op Note (Signed)
[facility] Patient Name: Amanda [name] Procedure Date: 04/19/2019 10:11 AM MRN: [id number] Date of Birth: 03-26-1931 Attending MD: Barney Drain MD, MD CSN: [id number] Age: 85 Admit Type: Outpatient Procedure:                Colonoscopy WITH COLD SNARE POLYPECTOMY Indications:              Hematochezia Providers:                Barney Drain MD, MD, Gerome Sam, RN, Aram Candela Referring MD:             Koleen Distance. Gottschalk Medicines:                Meperidine 50 mg IV, Midazolam 4 mg IV Complications:            No immediate complications. Estimated Blood Loss:     Estimated blood loss was minimal. Procedure:                Pre-Anesthesia Assessment:                           - Prior to the procedure, a History and Physical                            was performed, and patient medications and                            allergies were reviewed. The patient's tolerance of                            previous anesthesia was also reviewed. The risks                            and benefits of the procedure and the sedation                            options and risks were discussed with the patient.                            All questions were answered, and informed consent                            was obtained. Prior Anticoagulants: The patient has                            taken no previous anticoagulant or antiplatelet                            agents except for aspirin. ASA Grade Assessment: II                            - A patient with mild systemic disease. After  reviewing the risks and benefits, the patient was                            deemed in satisfactory condition to undergo the                            procedure. After obtaining informed consent, the                            colonoscope was passed under direct vision.                            Throughout the procedure, the patient's blood            pressure, pulse, and oxygen saturations were                            monitored continuously. The PCF-H190DL [id number]                            was introduced through the anus and advanced to the                            8 cm into the ileum. The colonoscopy was somewhat                            difficult due to a tortuous colon. Successful                            completion of the procedure was aided by                            straightening and shortening the scope to obtain                            bowel loop reduction and COLOWRAP. The patient                            tolerated the procedure well. The quality of the                            bowel preparation was good. The terminal ileum,                            ileocecal valve, appendiceal orifice, and rectum                            were photographed. Scope In: 10:40:58 AM Scope Out: 11:03:39 AM Scope Withdrawal Time: 0 hours 18 minutes 13 seconds  Total Procedure Duration: 0 hours 22 minutes 41 seconds  Findings:      The terminal ileum appeared normal.      Four sessile polyps were found in the hepatic flexure, proximal       ascending colon and cecum. The polyps were 2 to 6 mm  in size. These       polyps were removed with a cold snare. Resection and retrieval were       complete.      Multiple small and large-mouthed diverticula were found in the entire       colon.      External and internal hemorrhoids were found.      The recto-sigmoid colon, sigmoid colon and descending colon were       moderately tortuous. Impression:               - The examined portion of the ileum was normal.                           - Four 2 to 6 mm polyps at the hepatic flexure, in                            the proximal ascending colon and in the cecum(2),                            removed with a cold snare. Resected and retrieved.                           - Diverticulosis in the THE ENTIRE COLON.                            - External and internal hemorrhoids.                           - Tortuous LEFT colon.                           - BRBPR LIKELY DUE TO HEMORRHOIDS OR DIVERTICULOSIS Moderate Sedation:      Moderate (conscious) sedation was administered by the endoscopy nurse       and supervised by the endoscopist. The following parameters were       monitored: oxygen saturation, heart rate, blood pressure, and response       to care. Total physician intraservice time was 55 minutes. Recommendation:           - Patient has a contact number available for                            emergencies. The signs and symptoms of potential                            delayed complications were discussed with the                            patient. Return to normal activities tomorrow.                            Written discharge instructions were provided to the                            patient.                           -  High fiber diet.                           - Continue present medications.                           - Await pathology results.                           - Repeat colonoscopy is not recommended due to                            current age (25 years or older) for surveillance.                           - Return to GI office in 4 months. Procedure Code(s):        --- Professional ---                           [phone], Colonoscopy, flexible; with removal of                            tumor(s), polyp(s), or other lesion(s) by snare                            technique                           99153, Moderate sedation; each additional 15                            minutes intraservice time                           99153, Moderate sedation; each additional 15                            minutes intraservice time                           99153, Moderate sedation; each additional 15                            minutes intraservice time                           G0500, Moderate sedation services  provided by the                            same physician or other qualified health care                            professional performing a gastrointestinal                            endoscopic service that sedation supports,  requiring the presence of an independent trained                            observer to assist in the monitoring of the                            patient's level of consciousness and physiological                            status; initial 15 minutes of intra-service time;                            patient age 43 years or older (additional time may                            be reported with [phone], as appropriate) Diagnosis Code(s):        --- Professional ---                           K64.8, Other hemorrhoids                           K63.5, Polyp of colon                           K92.1, Melena (includes Hematochezia)                           K57.30, Diverticulosis of large intestine without                            perforation or abscess without bleeding                           Q43.8, Other specified congenital malformations of                            intestine CPT copyright 2019 American Medical Association. All rights reserved. The codes documented in this report are preliminary and upon coder review may  be revised to meet current compliance requirements. Barney Drain, MD Barney Drain MD, MD 04/19/2019 11:41:13 AM This report has been signed electronically. Number of Addenda: 0

## 2019-04-19 NOTE — Discharge Instructions (Signed)
I STRETCHED YOUR ESOPHAGUS BECAUSE OF YOUR PROBLEM SWALLOWING, WHICH IS DUE TO AN ESOPHAGEAL STRICTURE. YOU HAVE GASTRITIS DUE TO ASPIRIN and stomach polyps. YOUR RECTAL BLEEDING IS DUE TO HAVE MODERATE INTERNAL HEMORRHOIDS or diverticulosis IN YOUR LEFT and RIGHT COLON. You had four polyps removed. You have external and internal hemorrhoids.    FOLLOW A HIGH FIBER/LOW FAT DIET. AVOID ITEMS THAT CAUSE BLOATING.    CONTINUE OMEPRAZOLE.  TAKE 30 MINUTES PRIOR TO YOUR FIRST MEAL.   YOUR BIOPSY RESULTS WILL BE BACK IN 5 BUSINESS DAYS.  Follow up in 4 mos.  We do not routinely screen for polyps after the age of 21.   ENDOSCOPY Care After Read the instructions outlined below and refer to this sheet in the next week. These discharge instructions provide you with general information on caring for yourself after you leave the hospital. While your treatment has been planned according to the most current medical practices available, unavoidable complications occasionally occur. If you have any problems or questions after discharge, call DR. FIELDS, [phone].  ACTIVITY  You may resume your regular activity, but move at a slower pace for the next 24 hours.   Take frequent rest periods for the next 24 hours.   Walking will help get rid of the air and reduce the bloated feeling in your belly (abdomen).   No driving for 24 hours (because of the medicine (anesthesia) used during the test).   You may shower.   Do not sign any important legal documents or operate any machinery for 24 hours (because of the anesthesia used during the test).    NUTRITION  Drink plenty of fluids.   You may resume your normal diet as instructed by your doctor.   Begin with a light meal and progress to your normal diet. Heavy or fried foods are harder to digest and may make you feel sick to your stomach (nauseated).   Avoid alcoholic beverages for 24 hours or as instructed.    MEDICATIONS  You may resume  your normal medications.   WHAT YOU CAN EXPECT TODAY  Some feelings of bloating in the abdomen.   Passage of more gas than usual.   Spotting of blood in your stool or on the toilet paper  .  IF YOU HAD POLYPS REMOVED DURING THE ENDOSCOPY:  Eat a soft diet IF YOU HAVE NAUSEA, BLOATING, ABDOMINAL PAIN, OR VOMITING.    FINDING OUT THE RESULTS OF YOUR TEST Not all test results are available during your visit. DR. Oneida Alar WILL CALL YOU WITHIN 14 DAYS OF YOUR PROCEDUE WITH YOUR RESULTS. Do not assume everything is normal if you have not heard from DR. FIELDS, CALL HER OFFICE AT [phone].  SEEK IMMEDIATE MEDICAL ATTENTION AND CALL THE OFFICE: [phone] IF:  You have more than a spotting of blood in your stool.   Your belly is swollen (abdominal distention).   You are nauseated or vomiting.   You have a temperature over 101F.   You have abdominal pain or discomfort that is severe or gets worse throughout the day.   Gastritis  Gastritis is an inflammation (the body's way of reacting to injury and/or infection) of the stomach. It is often caused by viral or bacterial (germ) infections. It can also be caused BY ASPIRIN, BC/GOODY POWDER'S, (IBUPROFEN) MOTRIN, OR ALEVE (NAPROXEN), chemicals (including alcohol), SPICY FOODS, and medications. This illness may be associated with generalized malaise (feeling tired, not well), UPPER ABDOMINAL STOMACH cramps, and fever. One common bacterial cause  of gastritis is an organism known as H. Pylori. This can be treated with antibiotics.   Polyps, Colon  A polyp is extra tissue that grows inside your body. Colon polyps grow in the large intestine. The large intestine, also called the colon, is part of your digestive system. It is a long, hollow tube at the end of your digestive tract where your body makes and stores stool. Most polyps are not dangerous. They are benign. This means they are not cancerous. But over time, some types of polyps can turn  into cancer. Polyps that are smaller than a pea are usually not harmful. But larger polyps could someday become or may already be cancerous. To be safe, doctors remove all polyps and test them.   PREVENTION There is not one sure way to prevent polyps. You might be able to lower your risk of getting them if you:  Eat more fruits and vegetables and less fatty food.   Do not smoke.   Avoid alcohol.   Exercise every day.   Lose weight if you are overweight.   Eating more calcium and folate can also lower your risk of getting polyps. Some foods that are rich in calcium are milk, cheese, and broccoli. Some foods that are rich in folate are chickpeas, kidney beans, and spinach.  Diverticulosis Diverticulosis is a common condition that develops when small pouches (diverticula) form in the wall of the colon. The risk of diverticulosis increases with age. It happens more often in people who eat a low-fiber diet. Most individuals with diverticulosis have no symptoms. Those individuals with symptoms usually experience belly (abdominal) pain, constipation, or loose stools (diarrhea).  HOME CARE INSTRUCTIONS Increase the amount of fiber in your diet as directed by your caregiver or dietician. This may reduce symptoms of diverticulosis.  Drink at least 6 to 8 glasses of water each day to prevent constipation.  Try not to strain when you have a bowel movement.  Avoiding nuts and seeds to prevent complications is NOT NECESSARY.  FOODS HAVING HIGH FIBER CONTENT INCLUDE: Fruits. Apple, peach, pear, tangerine, raisins, prunes.  Vegetables. Brussels sprouts, asparagus, broccoli, cabbage, carrot, cauliflower, romaine lettuce, spinach, summer squash, tomato, winter squash, zucchini.  Starchy Vegetables. Baked beans, kidney beans, lima beans, split peas, lentils, potatoes (with skin).    SEEK IMMEDIATE MEDICAL CARE IF: You develop increasing pain or severe bloating.  You have an oral temperature above 101F.   You develop vomiting or bowel movements that are bloody or black.

## 2019-04-22 LAB — SURGICAL PATHOLOGY

## 2019-04-22 NOTE — Progress Notes (Signed)
CC'D TO PCP °

## 2019-04-24 ENCOUNTER — Encounter (HOSPITAL_COMMUNITY): Payer: Self-pay | Admitting: Gastroenterology

## 2019-04-25 NOTE — Progress Notes (Signed)
PATIENT SCHEDULED AND ON RECALL  °

## 2019-05-03 ENCOUNTER — Telehealth: Payer: Self-pay | Admitting: Family Medicine

## 2019-05-03 NOTE — Telephone Encounter (Signed)
Patient is on cephelexin and is having dysuria again and has 2 pills left. Recommended to wait til Monday and come leave a urine for culture, will call Monday for appt. Caryl Pina, MD [facility] 05/03/2019, 1:01 PM

## 2019-05-06 ENCOUNTER — Other Ambulatory Visit: Payer: Self-pay

## 2019-05-07 ENCOUNTER — Ambulatory Visit (INDEPENDENT_AMBULATORY_CARE_PROVIDER_SITE_OTHER): Payer: Medicare HMO | Admitting: Family Medicine

## 2019-05-07 ENCOUNTER — Encounter: Payer: Self-pay | Admitting: Family Medicine

## 2019-05-07 VITALS — BP 156/69 | HR 80 | Temp 97.3°F | Ht 60.0 in | Wt 133.0 lb

## 2019-05-07 DIAGNOSIS — N390 Urinary tract infection, site not specified: Secondary | ICD-10-CM

## 2019-05-07 DIAGNOSIS — J302 Other seasonal allergic rhinitis: Secondary | ICD-10-CM | POA: Diagnosis not present

## 2019-05-07 DIAGNOSIS — N3001 Acute cystitis with hematuria: Secondary | ICD-10-CM

## 2019-05-07 DIAGNOSIS — R300 Dysuria: Secondary | ICD-10-CM | POA: Diagnosis not present

## 2019-05-07 LAB — MICROSCOPIC EXAMINATION: WBC, UA: >30 /hpf — AB (ref 0–5)

## 2019-05-07 LAB — URINALYSIS, COMPLETE
Bilirubin, UA: NEGATIVE
Glucose, UA: NEGATIVE
Ketones, UA: NEGATIVE
Nitrite, UA: POSITIVE — AB
Specific Gravity, UA: 1.02 (ref 1.005–1.030)
Urobilinogen, Ur: 0.2 mg/dL (ref 0.2–1.0)
pH, UA: 7 (ref 5.0–7.5)

## 2019-05-07 MED ORDER — CEPHALEXIN 500 MG PO CAPS: 500.0000 mg | ORAL_CAPSULE | Freq: Two times a day (BID) | ORAL | 0 refills | Status: AC

## 2019-05-07 MED ORDER — LORATADINE 10 MG PO TABS: 10.0000 mg | ORAL_TABLET | Freq: Every day | ORAL | 3 refills | Status: DC

## 2019-05-07 MED ORDER — PHENAZOPYRIDINE HCL 100 MG PO TABS: 100.0000 mg | ORAL_TABLET | Freq: Three times a day (TID) | ORAL | 0 refills | Status: AC | PRN

## 2019-05-07 NOTE — Progress Notes (Signed)
Subjective: CC: UTI PCP: [name], DO LU:[id number] Amanda [name] is a 85 y.o. female presenting to clinic today for:  1. Urinary symptoms Patient reports onset of dysuria, urinary frequency and urinary incontinence on Friday.  Denies hematuria, fevers, chills, abdominal pain, nausea, vomiting, back pain, vaginal discharge.  Patient has used nothing for symptoms.  Patient reports a h/o frequent or recurrent UTIs.  Infection he has been treated for a UTI at least 3 other times this year.  She notes a history of having her "bladder stretched" many years ago in [place].  She has not since seen a urologist.  Medical history is significant for CKD 3, she is followed by [facility].  She does admit to not drinking enough water.  She uses depend pads.   ROS: Per HPI  Allergies  Allergen Reactions  . Livalo [Pitavastatin] Other (See Comments)    Causes dizziness  . Simvastatin Other (See Comments)    Causes dizziness  . Lisinopril     Hallucinations, resolved on ARB   Past Medical History:  Diagnosis Date  . Allergy   . Anxiety   . Coronary artery disease    LAD 30% followed by 60% stenosis; pressure wire measurement demonstrated no significant gradient; circumflex had 30% stenosis, right coronary artery has 40% stenosed; EF 75-80%  . Diverticulosis   . GERD (gastroesophageal reflux disease)   . Hyperlipidemia   . Hypertension   . Impaired renal function 04/23/2018  . Stroke [facility]) 2003   With a left MCA occlusion  . TIA (transient ischemic attack)     Current Outpatient Medications:  .  aspirin EC 81 MG tablet, Take 81 mg by mouth daily. , Disp: , Rfl:  .  atorvastatin (LIPITOR) 20 MG tablet, TAKE 1 TABLET EVERY NIGHT AT BEDTIME, Disp: 90 tablet, Rfl: 0 .  iron polysaccharides (NIFEREX) 150 MG capsule, Take 150 mg by mouth daily., Disp: , Rfl:  .  loratadine (CLARITIN) 10 MG tablet, Take 1 tablet (10 mg total) by mouth daily., Disp: 30 tablet, Rfl: 3 .  Multiple  Vitamins-Minerals (PRESERVISION AREDS 2) CAPS, Take 1 capsule by mouth 2 (two) times a day., Disp: 180 capsule, Rfl: 2 .  omeprazole (PRILOSEC) 20 MG capsule, Take 1 capsule (20 mg total) by mouth daily., Disp: 90 capsule, Rfl: 3 .  telmisartan (MICARDIS) 80 MG tablet, TAKE 1 TABLET EVERY DAY, Disp: 90 tablet, Rfl: 1 .  verapamil (CALAN-SR) 180 MG CR tablet, TAKE 1 TABLET (180 MG TOTAL) BY MOUTH AT BEDTIME. REPLACES 120MG ., Disp: 30 tablet, Rfl: 0 Social History   Socioeconomic History  . Marital status: Widowed    Spouse name: Not on file  . Number of children: 2  . Years of education: 33  . Highest education level: High school graduate  Occupational History  . 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 Financial resource strain: Not very hard  . Food insecurity    Worry: Never true    Inability: Never true  . Transportation needs    Medical: No    Non-medical: No  Tobacco Use  . Smoking status: Former Smoker    Quit date: 06/06/1985    Years since quitting: 33.9  . Smokeless tobacco: Never Used  . Tobacco comment: Approximately 10-pack-year history  Substance and Sexual Activity  . Alcohol use: No  . Drug use: No  . Sexual activity: Not on file  Lifestyle  .  Physical activity    Days per week: 0 days    Minutes per session: 0 min  . Stress: Only a little  Relationships  . Social connections    Talks on phone: More than three times a week    Gets together: More than three times a week    Attends religious service: More than 4 times per year    Active member of club or organization: Yes    Attends meetings of clubs or organizations: More than 4 times per year    Relationship status: Widowed  . Intimate partner violence    Fear of current or ex partner: No    Emotionally abused: No    Physically abused: No    Forced sexual activity: No  Other Topics Concern  . Not on file  Social History Narrative   Lives in [place] with her son.    Has been widowed for about 3 years.   Family History  Problem Relation Age of Onset  . Colon cancer Mother 50       dx on colonoscopy   . Cancer Father        unsure if cancer  --- tumor on brain  . Heart attack Sister   . Heart attack Brother   . Diabetes Brother   . GI problems Son   . COPD Sister   . Hypertension Brother   . Heart disease Brother   . Heart attack Brother   . Heart disease Brother   . Alcohol abuse Brother     Objective: Office vital signs reviewed. BP (!) 156/69   Pulse 80   Temp (!) 97.3 F (36.3 C) (Temporal)   Ht 5' (1.524 m)   Wt 133 lb (60.3 kg)   SpO2 97%   BMI 25.97 kg/m   Physical Examination:  General: Awake, alert, well nourished, No acute distress HEENT: Normal, sclera white, MMM GU: no suprapubic TTP, no CVA TTP  Assessment/ Plan: 85 y.o. female   1. Acute cystitis with hematuria Urinalysis with nitrites, leukocytes and trace blood.  Urine microscopy with greater than 30 white blood cells, 3-10 red blood cells, many bacteria.  Urine has been sent for culture.  Will treat with Keflex twice daily for 7 days.  Low-dose Pyridium also sent for dysuria.  I have referred her to urology for recurrent UTI. - urinalysis- dip and micro - Urine culture - cephALEXin (KEFLEX) 500 MG capsule; Take 1 capsule (500 mg total) by mouth 2 (two) times daily for 7 days.  Dispense: 14 capsule; Refill: 0 - phenazopyridine (PYRIDIUM) 100 MG tablet; Take 1 tablet (100 mg total) by mouth 3 (three) times daily as needed for up to 2 days for pain (urinary).  Dispense: 6 tablet; Refill: 0  2. Recurrent UTI - Ambulatory referral to Urology  3.  Allergic rhinitis Not discussed during today's visit.  Patient simply needed a refill. - loratadine (CLARITIN) 10 MG tablet; Take 1 tablet (10 mg total) by mouth daily.  Dispense: 90 tablet; Refill: 3   Orders Placed This Encounter  Procedures  . Urine culture  . urinalysis- dip and micro  . Ambulatory referral to  Urology    Referral Priority:   Routine    Referral Type:   Consultation    Referral Reason:   Specialty Services Required    Requested Specialty:   Urology    Number of Visits Requested:   1   Meds ordered this encounter  Medications  . cephALEXin (KEFLEX)  500 MG capsule    Sig: Take 1 capsule (500 mg total) by mouth 2 (two) times daily for 7 days.    Dispense:  14 capsule    Refill:  0  . phenazopyridine (PYRIDIUM) 100 MG tablet    Sig: Take 1 tablet (100 mg total) by mouth 3 (three) times daily as needed for up to 2 days for pain (urinary).    Dispense:  6 tablet    Refill:  0  . loratadine (CLARITIN) 10 MG tablet    Sig: Take 1 tablet (10 mg total) by mouth daily.    Dispense:  90 tablet    Refill:  Harrison, Harman [phone]

## 2019-05-08 ENCOUNTER — Other Ambulatory Visit (HOSPITAL_COMMUNITY): Payer: Medicare HMO

## 2019-05-09 LAB — URINE CULTURE

## 2019-06-12 ENCOUNTER — Other Ambulatory Visit: Payer: Self-pay | Admitting: Family Medicine

## 2019-06-12 DIAGNOSIS — I10 Essential (primary) hypertension: Secondary | ICD-10-CM

## 2019-06-18 ENCOUNTER — Ambulatory Visit: Payer: Medicare HMO | Admitting: Nurse Practitioner

## 2019-06-18 ENCOUNTER — Encounter: Payer: Self-pay | Admitting: Gastroenterology

## 2019-06-18 ENCOUNTER — Other Ambulatory Visit: Payer: Self-pay

## 2019-06-18 ENCOUNTER — Encounter: Payer: Self-pay | Admitting: Nurse Practitioner

## 2019-06-18 VITALS — BP 148/73 | HR 71 | Temp 96.6°F | Ht 60.0 in | Wt 135.4 lb

## 2019-06-18 DIAGNOSIS — D649 Anemia, unspecified: Secondary | ICD-10-CM

## 2019-06-18 DIAGNOSIS — K5900 Constipation, unspecified: Secondary | ICD-10-CM

## 2019-06-18 DIAGNOSIS — K219 Gastro-esophageal reflux disease without esophagitis: Secondary | ICD-10-CM

## 2019-06-18 MED ORDER — OMEPRAZOLE 20 MG PO CPDR: 20.0000 mg | DELAYED_RELEASE_CAPSULE | Freq: Two times a day (BID) | ORAL | 3 refills | Status: DC

## 2019-06-18 NOTE — Progress Notes (Signed)
CC'ED TO PCP 

## 2019-06-18 NOTE — Assessment & Plan Note (Signed)
Noted anemia with recent colonoscopy and endoscopy unremarkable.  She has not had her CBC checked in about 6 months.  I will put in to have this rechecked.  Follow-up in 6 months, continue taking iron in the meantime.

## 2019-06-18 NOTE — Patient Instructions (Signed)
Your health issues we discussed today were:   Nausea and "reflux" symptoms: 1. I have sent a prescription to your pharmacy to increase Prilosec to 20 mg twice a day.  Take this first thing in the morning and 30 minutes before your last meal the day 2. You can use the Prilosec/omeprazole that you have on hand.  When you run out you can call the pharmacy to get the new prescription 3. Call us if you have any worsening or severe symptoms  Constipation: 1. Start taking Colace stool softener 100 mg (available over-the-counter).  Take this once a day. 2. If you still have hard stools despite once a day Colace, you can increase this to twice a day 3. If you have diarrhea or loose stools cut back on or stop taking Colace 4. Call us if you have persistent constipation despite Colace twice a day or if you have any side effects to Colace  Anemia (low blood): 1. Your last labs were completed in July 2020 and it looks like your blood levels are doing well 2. Continue taking iron 3. Check your blood cell count when you are able to.  We will call you with the results 4. Call us for any obvious worsening or significant bleeding  Overall I recommend:  1. Continue your other current medications 2. Return for follow-up in 6 months 3. Call us if you have any questions or concerns   Because of recent events of COVID-19 ("Coronavirus"), follow CDC recommendations:  Wash your hand frequently Avoid touching your face Stay away from people who are sick If you have symptoms such as fever, cough, shortness of breath then call your healthcare provider for further guidance If you are sick, STAY AT HOME unless otherwise directed by your healthcare provider. Follow directions from state and national officials regarding staying safe   At [facility] we value your feedback. You may receive a survey about your visit today. Please share your experience as we strive to create trusting relationships  with our patients to provide genuine, compassionate, quality care.  We appreciate your understanding and patience as we review any laboratory studies, imaging, and other diagnostic tests that are ordered as we care for you. Our office policy is 5 business days for review of these results, and any emergent or urgent results are addressed in a timely manner for your best interest. If you do not hear from our office in 1 week, please contact us.   We also encourage the use of MyChart, which contains your medical information for your review as well. If you are not enrolled in this feature, an access code is on this after visit summary for your convenience. Thank you for allowing [place] to be involved in your care.  It was great to see you today!  I hope you have a Happy New Year!!

## 2019-06-18 NOTE — Progress Notes (Signed)
Referring Provider: [name], DO Primary Care Physician:  [name], DO Primary GI:  Dr. Oneida Alar  Chief Complaint  Patient presents with  . Constipation  . black stool    takes Iron    HPI:   Amanda [name] is a 86 y.o. female who presents for constipation and black stool.  Patient was last seen in our office 02/13/2019 for hematochezia, melena, anemia, constipation.  Noted family history of colon cancer in her mother.  On iron for many years.  Intermittent bleeding per primary care.  Primary care point of care hemoglobin fingerstick with results found to be 9.6 which was a 2 g drop from previous value 1 month prior.  Last colonoscopy 2016 at [facility] which found moderate diverticulosis and otherwise normal.  Presumed repeat exam in 5 years.  At her last visit she noted no blood or black stools in a month.  Intermittent GERD symptoms on Prilosec daily.  Lower abdominal intermittent crampy pain with a history of IBS for years.  2 stools a day with straining and stool softeners have not helped and has had to use an enema intermittently.  However, she did admit she was not using stool softener in a while.  Chronic dizziness for 5 years.  No other overt GI complaints.  Recommended colonoscopy and upper endoscopy, Colace once a day every day, call in 1 to 2 weeks if no improvement in constipation, follow-up in 4 months.  Colonoscopy completed 04/19/2019 which found four 2 to 6 mm polyps at hepatic flexure, proximal ascending colon, and cecum.  Diverticulosis in the entire examined colon, external and internal hemorrhoids, BRBPR likely due to hemorrhoids or diverticulosis.  Surgical pathology, polyps to be tubular adenoma and recommended high-fiber diet, no further colonoscopies due to age.  EGD completed the same day found mild Schatzki's ring status post dilation, small hiatal hernia, multiple gastric polyps status post biopsy, no obvious source for melena/anemia.   Surgical pathology found the biopsies to be benign small bowel mucosa in the duodenum and fundic gland polyps without metaplasia, dysplasia, or malignancy in the stomach.  Recommended continue omeprazole daily, follow-up in 4 months.  Today she states she's doing ok overall. Has dark/black stools on iron. Also with constipation, has a bowel movement about twice a day, stools are hard and black, intermittent straining. Typically sits on the commode 3-5 minutes with good sense of complete emptying. Did have some hematochezia a couple weeks ago, noted to be "more than normal" and on the stool/in the commode. Blood was seen on the pad. Occasional abdominal discomfort, typically as a heral of need to have a bowel movement; resolves after a bowel movement. Some recent nausea but no vomiting. Worse with bending forward. Feels like a sensation of reflux. She is on Prilosec 20 mg once daily. Denies fever, chills, unintentional weight loss. Denies URI or flu-like symptoms. Denies loss of sense of taste or smell.   Past Medical History:  Diagnosis Date  . Allergy   . Anxiety   . Coronary artery disease    LAD 30% followed by 60% stenosis; pressure wire measurement demonstrated no significant gradient; circumflex had 30% stenosis, right coronary artery has 40% stenosed; EF 75-80%  . Diverticulosis   . GERD (gastroesophageal reflux disease)   . Hyperlipidemia   . Hypertension   . Impaired renal function 04/23/2018  . Stroke [facility]) 2003   With a left MCA occlusion  . TIA (transient ischemic attack)  Past Surgical History:  Procedure Laterality Date  . ABDOMINAL HYSTERECTOMY     partial and complete - x  surgeries  . APPENDECTOMY    . CHOLECYSTECTOMY    . COLONOSCOPY N/A 04/19/2019   Procedure: COLONOSCOPY;  Surgeon: Danie Binder, MD;  Location: [facility];  Service: Endoscopy;  Laterality: N/A;  1:00pm-office rescheduled 11/13 @ 10:30am  . ESOPHAGOGASTRODUODENOSCOPY N/A 04/19/2019   Procedure:  ESOPHAGOGASTRODUODENOSCOPY (EGD);  Surgeon: Danie Binder, MD;  Location: [facility];  Service: Endoscopy;  Laterality: N/A;  . EYE SURGERY Bilateral    cataracts  . Hysterectomy-type unspecified    . POLYPECTOMY  04/19/2019   Procedure: POLYPECTOMY;  Surgeon: Danie Binder, MD;  Location: [facility];  Service: Endoscopy;;  colon   . TONSILLECTOMY      Current Outpatient Medications  Medication Sig Dispense Refill  . aspirin EC 81 MG tablet Take 81 mg by mouth daily.     Marland Kitchen atorvastatin (LIPITOR) 20 MG tablet TAKE 1 TABLET EVERY NIGHT AT BEDTIME 90 tablet 0  . iron polysaccharides (NIFEREX) 150 MG capsule Take 150 mg by mouth daily.    Marland Kitchen loratadine (CLARITIN) 10 MG tablet Take 1 tablet (10 mg total) by mouth daily. 90 tablet 3  . Multiple Vitamins-Minerals (PRESERVISION AREDS 2) CAPS Take 1 capsule by mouth 2 (two) times a day. 180 capsule 2  . telmisartan (MICARDIS) 80 MG tablet TAKE 1 TABLET EVERY DAY 90 tablet 1  . verapamil (CALAN-SR) 180 MG CR tablet TAKE 1 TABLET AT BEDTIME (REPLACES 120MG ) 90 tablet 0  . omeprazole (PRILOSEC) 20 MG capsule Take 1 capsule (20 mg total) by mouth 2 (two) times daily before a meal. 180 capsule 3   No current facility-administered medications for this visit.    Allergies as of 06/18/2019 - Review Complete 06/18/2019  Allergen Reaction Noted  . Livalo [pitavastatin] Other (See Comments) 05/19/2012  . Simvastatin Other (See Comments) 05/19/2012  . Lisinopril  09/14/2017    Family History  Problem Relation Age of Onset  . Colon cancer Mother 65       dx on colonoscopy   . Cancer Father        unsure if cancer  --- tumor on brain  . Heart attack Sister   . Heart attack Brother   . Diabetes Brother   . GI problems Son   . COPD Sister   . Hypertension Brother   . Heart disease Brother   . Heart attack Brother   . Heart disease Brother   . Alcohol abuse Brother     Social History   Socioeconomic History  . Marital status:  Widowed    Spouse name: Not on file  . Number of children: 2  . Years of education: 60  . Highest education level: High school graduate  Occupational History  . Occupation: Horticulturist, commercial: RETIRED    Comment: Retired  Tobacco Use  . Smoking status: Former Smoker    Quit date: 06/06/1985    Years since quitting: 34.0  . Smokeless tobacco: Never Used  . Tobacco comment: Approximately 10-pack-year history  Substance and Sexual Activity  . Alcohol use: No  . Drug use: No  . Sexual activity: Not on file  Other Topics Concern  . Not on file  Social History Narrative   Lives in [place] with her son.   Has been widowed for about 3 years.   Social Determinants of Radio broadcast assistant  Strain: Low Risk   . Difficulty of Paying Living Expenses: Not very hard  Food Insecurity: No Food Insecurity  . Worried About Charity fundraiser in the Last Year: Never true  . Ran Out of Food in the Last Year: Never true  Transportation Needs: No Transportation Needs  . Lack of Transportation (Medical): No  . Lack of Transportation (Non-Medical): No  Physical Activity: Inactive  . Days of Exercise per Week: 0 days  . Minutes of Exercise per Session: 0 min  Stress: No Stress Concern Present  . Feeling of Stress : Only a little  Social Connections: Slightly Isolated  . Frequency of Communication with Friends and Family: More than three times a week  . Frequency of Social Gatherings with Friends and Family: More than three times a week  . Attends Religious Services: More than 4 times per year  . Active Member of Clubs or Organizations: Yes  . Attends Archivist Meetings: More than 4 times per year  . Marital Status: Widowed    Review of Systems: General: Negative for anorexia, weight loss, fever, chills, fatigue, weakness. ENT: Negative for hoarseness, difficulty swallowing. CV: Negative for chest pain, angina, palpitations, peripheral edema.  Respiratory: Negative for  dyspnea at rest, cough, sputum, wheezing.  GI: See history of present illness.  Endo: Negative for unusual weight change.  Heme: Negative for bruising or bleeding. Allergy: Negative for rash or hives.   Physical Exam: BP (!) 148/73   Pulse 71   Temp (!) 96.6 F (35.9 C) (Temporal)   Ht 5' (1.524 m)   Wt 135 lb 6.4 oz (61.4 kg)   BMI 26.44 kg/m  General:   Alert and oriented. Pleasant and cooperative. Well-nourished and well-developed.  Eyes:  Without icterus, sclera clear and conjunctiva pink.  Ears:  Normal auditory acuity. Cardiovascular:  S1, S2 present without murmurs appreciated. Extremities without clubbing or edema. Respiratory:  Clear to auscultation bilaterally. No wheezes, rales, or rhonchi. No distress.  Gastrointestinal:  +BS, soft, non-tender and non-distended. No HSM noted. No guarding or rebound. No masses appreciated.  Rectal:  Deferred  Musculoskalatal:  Symmetrical without gross deformities. Neurologic:  Alert and oriented x4;  grossly normal neurologically. Psych:  Alert and cooperative. Normal mood and affect. Heme/Lymph/Immune: No excessive bruising noted.    06/18/2019 11:47 AM   Disclaimer: This note was dictated with voice recognition software. Similar sounding words can inadvertently be transcribed and may not be corrected upon review.

## 2019-06-18 NOTE — Assessment & Plan Note (Signed)
Patient has chronic constipation on iron.  Iron is likely responsible for this but she needs it due to ongoing anemia as per below.  At this point she is not on anything for constipation.  She does not have significant straining for multiple days between bowel movements.  However, she does note hard stools.  I will have her start Colace 100 mg daily.  She can increase this to twice daily as needed.  Call for any worsening or severe symptoms and follow-up in 6 months otherwise.

## 2019-06-18 NOTE — Assessment & Plan Note (Signed)
GERD symptoms currently on Prilosec 20 mg daily.  She is having some increased nausea/"feels like reflux" symptoms.  At this point I will increase her Prilosec to 20 mg twice daily.  I will send a new prescription to her pharmacy.  Follow-up in 6 months.  Call for any worsening or severe symptoms.

## 2019-06-19 ENCOUNTER — Other Ambulatory Visit: Payer: Self-pay | Admitting: Family Medicine

## 2019-06-19 ENCOUNTER — Ambulatory Visit (INDEPENDENT_AMBULATORY_CARE_PROVIDER_SITE_OTHER): Payer: Medicare HMO | Admitting: Urology

## 2019-06-19 ENCOUNTER — Encounter: Payer: Self-pay | Admitting: Urology

## 2019-06-19 ENCOUNTER — Other Ambulatory Visit (HOSPITAL_COMMUNITY)
Admission: RE | Admit: 2019-06-19 | Discharge: 2019-06-19 | Disposition: A | Discharge status: Home / Self Care | Payer: Medicare HMO | Source: Ambulatory Visit | Attending: Urology | Admitting: Urology

## 2019-06-19 VITALS — BP 161/73 | HR 68 | Temp 97.5°F | Ht 60.0 in | Wt 135.0 lb

## 2019-06-19 DIAGNOSIS — N3021 Other chronic cystitis with hematuria: Secondary | ICD-10-CM | POA: Insufficient documentation

## 2019-06-19 LAB — POCT URINALYSIS DIPSTICK
Bilirubin, UA: NEGATIVE
Blood, UA: NEGATIVE
Glucose, UA: NEGATIVE
Ketones, UA: NEGATIVE
Nitrite, UA: NEGATIVE
Protein, UA: NEGATIVE
Spec Grav, UA: 1.025 (ref 1.010–1.025)
Urobilinogen, UA: NEGATIVE E.U./dL — AB
pH, UA: 5 (ref 5.0–8.0)

## 2019-06-19 LAB — BLADDER SCAN AMB NON-IMAGING: Scan Result: 31.5

## 2019-06-19 MED ORDER — TRIMETHOPRIM 100 MG PO TABS: 100.0000 mg | ORAL_TABLET | Freq: Every evening | ORAL | 11 refills | Status: DC | PRN

## 2019-06-19 NOTE — Addendum Note (Signed)
Addended byIris Pert on: 06/19/2019 02:24 PM   Modules accepted: Orders

## 2019-06-19 NOTE — Patient Instructions (Signed)
Urinary Tract Infection, Adult A urinary tract infection (UTI) is an infection of any part of the urinary tract. The urinary tract includes:  The kidneys.  The ureters.  The bladder.  The urethra. These organs make, store, and get rid of pee (urine) in the body. What are the causes? This is caused by germs (bacteria) in your genital area. These germs grow and cause swelling (inflammation) of your urinary tract. What increases the risk? You are more likely to develop this condition if:  You have a small, thin tube (catheter) to drain pee.  You cannot control when you pee or poop (incontinence).  You are female, and: ? You use these methods to prevent pregnancy:  A medicine that kills sperm (spermicide).  A device that blocks sperm (diaphragm). ? You have low levels of a female hormone (estrogen). ? You are pregnant.  You have genes that add to your risk.  You are sexually active.  You take antibiotic medicines.  You have trouble peeing because of: ? A prostate that is bigger than normal, if you are female. ? A blockage in the part of your body that drains pee from the bladder (urethra). ? A kidney stone. ? A nerve condition that affects your bladder (neurogenic bladder). ? Not getting enough to drink. ? Not peeing often enough.  You have other conditions, such as: ? Diabetes. ? A weak disease-fighting system (immune system). ? Sickle cell disease. ? Gout. ? Injury of the spine. What are the signs or symptoms? Symptoms of this condition include:  Needing to pee right away (urgently).  Peeing often.  Peeing small amounts often.  Pain or burning when peeing.  Blood in the pee.  Pee that smells bad or not like normal.  Trouble peeing.  Pee that is cloudy.  Fluid coming from the vagina, if you are female.  Pain in the belly or lower back. Other symptoms include:  Throwing up (vomiting).  No urge to eat.  Feeling mixed up (confused).  Being tired  and grouchy (irritable).  A fever.  Watery poop (diarrhea). How is this treated? This condition may be treated with:  Antibiotic medicine.  Other medicines.  Drinking enough water. Follow these instructions at home:  Medicines  Take over-the-counter and prescription medicines only as told by your doctor.  If you were prescribed an antibiotic medicine, take it as told by your doctor. Do not stop taking it even if you start to feel better. General instructions  Make sure you: ? Pee until your bladder is empty. ? Do not hold pee for a long time. ? Empty your bladder after sex. ? Wipe from front to back after pooping if you are a female. Use each tissue one time when you wipe.  Drink enough fluid to keep your pee pale yellow.  Keep all follow-up visits as told by your doctor. This is important. Contact a doctor if:  You do not get better after 1-2 days.  Your symptoms go away and then come back. Get help right away if:  You have very bad back pain.  You have very bad pain in your lower belly.  You have a fever.  You are sick to your stomach (nauseous).  You are throwing up. Summary  A urinary tract infection (UTI) is an infection of any part of the urinary tract.  This condition is caused by germs in your genital area.  There are many risk factors for a UTI. These include having a small, thin   tube to drain pee and not being able to control when you pee or poop.  Treatment includes antibiotic medicines for germs.  Drink enough fluid to keep your pee pale yellow. This information is not intended to replace advice given to you by your health care provider. Make sure you discuss any questions you have with your health care provider. Document Revised: 05/10/2018 Document Reviewed: 11/30/2017 Elsevier Patient Education  2020 [employer]  

## 2019-06-19 NOTE — Progress Notes (Signed)
06/19/2019 2:07 PM   Amanda [name] 03-06-1931 [id number]  Referring provider: [name], DO [place],  [place] [address]  Recurrent UTI  HPI: Amanda [name] is an 86yo her for evaluation of recurrent UTI. She has had 4 UTIs in the past year. Last culture was Klebsiella and prior to that E coli. She knows she has a UTI when she develops urinary urgency, frequency, dysuria and occasional gross hematuria during UTI. She has stress urinary incontinence and uses 2 pads per day which are wet when she changes them. No Urge urinary incontinence. She had he bladder stretched over 20 years ago. No hx of nephrolithiasis.  She underwent renal US in 04/2018 which showed no calculi.    PMH: Past Medical History:  Diagnosis Date  . Allergy   . Anxiety   . Coronary artery disease    LAD 30% followed by 60% stenosis; pressure wire measurement demonstrated no significant gradient; circumflex had 30% stenosis, right coronary artery has 40% stenosed; EF 75-80%  . Diverticulosis   . GERD (gastroesophageal reflux disease)   . Hematuria   . Hyperlipidemia   . Hypertension   . Impaired renal function 04/23/2018  . Stroke [facility]) 2003   With a left MCA occlusion  . TIA (transient ischemic attack)     Surgical History: Past Surgical History:  Procedure Laterality Date  . ABDOMINAL HYSTERECTOMY     partial and complete - x  surgeries  . APPENDECTOMY    . CHOLECYSTECTOMY    . COLONOSCOPY N/A 04/19/2019   Procedure: COLONOSCOPY;  Surgeon: Danie Binder, MD;  [facility];  Service: Endoscopy;  Laterality: N/A;  1:00pm-office rescheduled 11/13 @ 10:30am  . ESOPHAGOGASTRODUODENOSCOPY N/A 04/19/2019   Procedure: ESOPHAGOGASTRODUODENOSCOPY (EGD);  Surgeon: Danie Binder, MD;  [facility];  Service: Endoscopy;  Laterality: N/A;  . EYE SURGERY Bilateral    cataracts  . Hysterectomy-type unspecified    . POLYPECTOMY  04/19/2019   Procedure: POLYPECTOMY;  Surgeon:  Danie Binder, MD;  [facility];  Service: Endoscopy;;  colon   . TONSILLECTOMY      Home Medications:  Allergies as of 06/19/2019      Reactions   Livalo [pitavastatin] Other (See Comments)   Causes dizziness   Simvastatin Other (See Comments)   Causes dizziness   Lisinopril    Hallucinations, resolved on ARB      Medication List       Accurate as of June 19, 2019  2:07 PM. If you have any questions, ask your nurse or doctor.        aspirin EC 81 MG tablet Take 81 mg by mouth daily.   atorvastatin 20 MG tablet Commonly known as: LIPITOR TAKE 1 TABLET EVERY NIGHT AT BEDTIME   docusate sodium 100 MG capsule Commonly known as: COLACE Take 100 mg by mouth 2 (two) times daily.   iron polysaccharides 150 MG capsule Commonly known as: NIFEREX Take 150 mg by mouth daily.   loratadine 10 MG tablet Commonly known as: CLARITIN Take 1 tablet (10 mg total) by mouth daily.   omeprazole 20 MG capsule Commonly known as: PRILOSEC Take 1 capsule (20 mg total) by mouth 2 (two) times daily before a meal.   PreserVision AREDS 2 Caps Take 1 capsule by mouth 2 (two) times a day.   telmisartan 80 MG tablet Commonly known as: MICARDIS TAKE 1 TABLET EVERY DAY   verapamil 180 MG CR  tablet Commonly known as: CALAN-SR TAKE 1 TABLET AT BEDTIME (REPLACES 120MG )       Allergies:  Allergies  Allergen Reactions  . Livalo [Pitavastatin] Other (See Comments)    Causes dizziness  . Simvastatin Other (See Comments)    Causes dizziness  . Lisinopril     Hallucinations, resolved on ARB    Family History: Family History  Problem Relation Age of Onset  . Colon cancer Mother 1       dx on colonoscopy   . Cancer Father        unsure if cancer  --- tumor on brain  . Heart attack Sister   . Heart attack Brother   . Diabetes Brother   . GI problems Son   . COPD Sister   . Hypertension Brother   . Heart disease Brother   . Heart attack Brother   . Heart disease  Brother   . Alcohol abuse Brother     Social History:  reports that she quit smoking about 34 years ago. She has never used smokeless tobacco. She reports that she does not drink alcohol or use drugs.  ROS:                                        Physical Exam: BP (!) 161/73   Pulse 68   Temp (!) 97.5 F (36.4 C)   Ht 5' (1.524 m)   Wt 135 lb (61.2 kg)   BMI 26.37 kg/m   Constitutional:  Alert and oriented, No acute distress. HEENT: [place] AT, moist mucus membranes.  Trachea midline, no masses. Cardiovascular: No clubbing, cyanosis, or edema. Respiratory: Normal respiratory effort, no increased work of breathing. GI: Abdomen is soft, nontender, nondistended, no abdominal masses GU: No CVA tenderness Lymph: No cervical or inguinal lymphadenopathy. Skin: No rashes, bruises or suspicious lesions. Neurologic: Grossly intact, no focal deficits, moving all 4 extremities. Psychiatric: Normal mood and affect.  Laboratory Data: Lab Results  Component Value Date   WBC 6.8 12/31/2018   HGB 11.6 12/31/2018   HCT 34.0 12/31/2018   MCV 95 12/31/2018   PLT 157 12/31/2018    Lab Results  Component Value Date   CREATININE 1.14 (H) 12/31/2018    No results found for: PSA  No results found for: TESTOSTERONE  Lab Results  Component Value Date   HGBA1C 5.6 04/10/2013    Urinalysis    Component Value Date/Time   COLORURINE YELLOW 04/10/2013 2115   APPEARANCEUR Cloudy (A) 05/07/2019 1017   LABSPEC 1.016 04/10/2013 2115   PHURINE 6.5 04/10/2013 2115   GLUCOSEU Negative 05/07/2019 1017   HGBUR NEGATIVE 04/10/2013 2115   BILIRUBINUR Negative 05/07/2019 1017   KETONESUR NEGATIVE 04/10/2013 2115   PROTEINUR 1+ (A) 05/07/2019 [place] 04/10/2013 2115   UROBILINOGEN 1.0 04/10/2013 2115   NITRITE Positive (A) 05/07/2019 1017   NITRITE NEGATIVE 04/10/2013 2115   LEUKOCYTESUR 3+ (A) 05/07/2019 1017    Lab Results  Component Value Date    LABMICR See below: 05/07/2019   WBCUA >30 (A) 05/07/2019   RBCUA 11-30 (A) 08/14/2018   LABEPIT 0-10 05/07/2019   MUCUS Present 05/07/2019   BACTERIA Many (A) 05/07/2019    Pertinent Imaging: Renal US No results found for this or any previous visit. No results found for this or any previous visit. No results found for this or any previous visit.  No results found for this or any previous visit. Results for orders placed during the hospital encounter of 04/13/18  US RENAL   Narrative CLINICAL DATA:  Chronic kidney disease.  History of hypertension.  EXAM: RENAL / URINARY TRACT ULTRASOUND COMPLETE  COMPARISON:  None.  FINDINGS: Right Kidney:  Renal measurements: 9.9 x 4 x 4.9 cm = volume: 102 mL . Echogenicity within normal limits. No mass or hydronephrosis visualized.  Left Kidney:  Renal measurements: 9.6 x 3.9 x 5.5 cm = volume: 108 mL. Echogenicity within normal limits. No mass or hydronephrosis visualized.  Bladder:  Appears normal for degree of bladder distention. Ureteral jets not identified.  Included liver appears mildly echogenic seen with hepatocellular disease/steatosis.  IMPRESSION: Negative renal ultrasound.   Electronically Signed   By: Elon Alas M.D.   On: 04/13/2018 14:24    No results found for this or any previous visit. No results found for this or any previous visit. No results found for this or any previous visit.  Assessment & Plan:    1. Recurrent cystitis: -We the natural hx of recurrent UTI and the various causes. Her UTIs are likely related to her incontinence pad usage. Since she is not a candidate for incontinence surgery we will proceed with prophylactic antibiotics No follow-ups on file.  Nicolette Bang, MD  [facility] [place]

## 2019-06-19 NOTE — Progress Notes (Signed)
Urological Symptom Review  Patient is experiencing the following symptoms: Frequent urination Burning/pain with urination Get up at night to urinate Leakage of urine Stream starts and stops Trouble starting stream Blood in urine Urinary tract infection   Review of Systems  Gastrointestinal (upper)  : Negative for upper GI symptoms  Gastrointestinal (lower) : Constipation  Constitutional : fatigue  Skin: Negative for skin symptoms  Eyes: Negative for eye symptoms  Ear/Nose/Throat : Sinus problems  Hematologic/Lymphatic: Easy bruising  Cardiovascular : Negative for cardiovascular symptoms  Respiratory  Negative for respiratory  symptoms  Endocrine: Excessive thirst  Musculoskeletal: Back pain  Neurological: Dizziness  Psychologic: Depression Anxiety

## 2019-06-20 ENCOUNTER — Other Ambulatory Visit: Payer: Self-pay

## 2019-06-20 ENCOUNTER — Other Ambulatory Visit: Payer: Medicare HMO

## 2019-06-20 ENCOUNTER — Telehealth: Payer: Self-pay | Admitting: Gastroenterology

## 2019-06-20 DIAGNOSIS — D649 Anemia, unspecified: Secondary | ICD-10-CM | POA: Diagnosis not present

## 2019-06-20 NOTE — Telephone Encounter (Signed)
Noted. Lab orders were changed to [facility]

## 2019-06-20 NOTE — Telephone Encounter (Signed)
[facility] called to ask for [place] to change lab order from [facility] to [facility]

## 2019-06-21 LAB — CBC WITH DIFFERENTIAL/PLATELET
Basophils Absolute: 0.1 10*3/uL (ref 0.0–0.2)
Basos: 1 %
EOS (ABSOLUTE): 0.3 10*3/uL (ref 0.0–0.4)
Eos: 4 %
Hematocrit: 38.7 % (ref 34.0–46.6)
Hemoglobin: 12.9 g/dL (ref 11.1–15.9)
Immature Grans (Abs): 0 10*3/uL (ref 0.0–0.1)
Immature Granulocytes: 0 %
Lymphocytes Absolute: 2.3 10*3/uL (ref 0.7–3.1)
Lymphs: 35 %
MCH: 31 pg (ref 26.6–33.0)
MCHC: 33.3 g/dL (ref 31.5–35.7)
MCV: 93 fL (ref 79–97)
Monocytes Absolute: 0.8 10*3/uL (ref 0.1–0.9)
Monocytes: 12 %
Neutrophils Absolute: 3.1 10*3/uL (ref 1.4–7.0)
Neutrophils: 48 %
Platelets: 177 10*3/uL (ref 150–450)
RBC: 4.16 x10E6/uL (ref 3.77–5.28)
RDW: 14.4 % (ref 11.7–15.4)
WBC: 6.5 10*3/uL (ref 3.4–10.8)

## 2019-06-21 LAB — URINE CULTURE

## 2019-08-26 ENCOUNTER — Telehealth: Payer: Self-pay | Admitting: Family Medicine

## 2019-08-26 ENCOUNTER — Other Ambulatory Visit: Payer: Self-pay | Admitting: Family Medicine

## 2019-08-26 DIAGNOSIS — I10 Essential (primary) hypertension: Secondary | ICD-10-CM

## 2019-08-26 NOTE — Telephone Encounter (Signed)
Patient needs to be seen.

## 2019-08-26 NOTE — Telephone Encounter (Signed)
Appointment scheduled tomorrow at 11:40 am with Amanda [name], patient aware.

## 2019-08-27 ENCOUNTER — Other Ambulatory Visit: Payer: Medicare HMO

## 2019-08-27 ENCOUNTER — Ambulatory Visit: Payer: Medicare HMO | Admitting: Family

## 2019-08-27 ENCOUNTER — Ambulatory Visit (INDEPENDENT_AMBULATORY_CARE_PROVIDER_SITE_OTHER): Payer: Medicare HMO | Admitting: Family Medicine

## 2019-08-27 ENCOUNTER — Other Ambulatory Visit: Payer: Self-pay

## 2019-08-27 DIAGNOSIS — K625 Hemorrhage of anus and rectum: Secondary | ICD-10-CM

## 2019-08-27 MED ORDER — HYDROCORTISONE ACETATE 25 MG RE SUPP: 25.0000 mg | Freq: Two times a day (BID) | RECTAL | 0 refills | Status: DC

## 2019-08-27 NOTE — Progress Notes (Signed)
Telephone visit  Subjective: CC: blood in stool PCP: Amanda Norlander, DO [id number] Amanda [name] is a 85 y.o. female calls for telephone consult today. Patient provides verbal consent for consult held via phone.  Due to COVID-19 pandemic this visit was conducted virtually. This visit type was conducted due to national recommendations for restrictions regarding the COVID-19 Pandemic (e.g. social distancing, sheltering in place) in an effort to limit this patient's exposure and mitigate transmission in our community. All issues noted in this document were discussed and addressed.  A physical exam was not performed with this format.   Location of patient: home Location of provider: [facility] Others present for call: none  1. Blood in stool Patient reports onset Friday night.  She reports that she has been passing blood clots in her stool.  She reports melena and bright red blood.  No rectal pain, abdominal pain, nausea, vomiting.  She ran out of baby aspirin so she took motrin a week ago.  No rectal itching or burning.  No hemorrhoids.  Bowel movements are normal.  No straining.  She goes normally 2-3 times daily.  She is compliant with her iron pill.  No dizziness, shortness of breath, fatigue, change in exercise tolerance, pallor.  ROS: Per HPI  Allergies  Allergen Reactions  . Livalo [Pitavastatin] Other (See Comments)    Causes dizziness  . Simvastatin Other (See Comments)    Causes dizziness  . Lisinopril     Hallucinations, resolved on ARB   Past Medical History:  Diagnosis Date  . Allergy   . Anxiety   . Coronary artery disease    LAD 30% followed by 60% stenosis; pressure wire measurement demonstrated no significant gradient; circumflex had 30% stenosis, right coronary artery has 40% stenosed; EF 75-80%  . Diverticulosis   . GERD (gastroesophageal reflux disease)   . Hematuria   . Hyperlipidemia   . Hypertension   . Impaired renal function 04/23/2018  . Stroke [facility]) 2003   With a  left MCA occlusion  . TIA (transient ischemic attack)     Current Outpatient Medications:  .  aspirin EC 81 MG tablet, Take 81 mg by mouth daily. , Disp: , Rfl:  .  atorvastatin (LIPITOR) 20 MG tablet, Take 1 tablet (20 mg total) by mouth at bedtime. Needs to be seen before next refill, Disp: 30 tablet, Rfl: 0 .  docusate sodium (COLACE) 100 MG capsule, Take 100 mg by mouth 2 (two) times daily., Disp: , Rfl:  .  iron polysaccharides (NIFEREX) 150 MG capsule, Take 150 mg by mouth daily., Disp: , Rfl:  .  loratadine (CLARITIN) 10 MG tablet, Take 1 tablet (10 mg total) by mouth daily., Disp: 90 tablet, Rfl: 3 .  Multiple Vitamins-Minerals (PRESERVISION AREDS 2) CAPS, Take 1 capsule by mouth 2 (two) times a day., Disp: 180 capsule, Rfl: 2 .  omeprazole (PRILOSEC) 20 MG capsule, Take 1 capsule (20 mg total) by mouth 2 (two) times daily before a meal., Disp: 180 capsule, Rfl: 3 .  telmisartan (MICARDIS) 80 MG tablet, TAKE 1 TABLET EVERY DAY, Disp: 90 tablet, Rfl: 1 .  trimethoprim (TRIMPEX) 100 MG tablet, Take 1 tablet (100 mg total) by mouth at bedtime as needed., Disp: 30 tablet, Rfl: 11 .  verapamil (CALAN-SR) 180 MG CR tablet, TAKE 1 TABLET AT BEDTIME (REPLACES 120MG ).  Needs to be seen for future refills., Disp: 30 tablet, Rfl: 0  Assessment/ Plan: 86 y.o. female   1. Rectal bleeding I will try  and coordinate an appointment with GI to be seen as soon as possible.  At this time she is not exhibiting any anemic symptoms.  Continue iron pill.  I have added Anusol HC to see if perhaps this is an internal bleeding hemorrhoid that can be improved with Anusol.  We discussed red flag signs and symptoms warranting further evaluation emergency department.  She voiced understanding of follow-up as needed - hydrocortisone (ANUSOL-HC) 25 MG suppository; Place 1 suppository (25 mg total) rectally 2 (two) times daily.  Dispense: 12 suppository; Refill: 0 - FOBT  Start time: 9:31am End time: 9:41am  Total  time spent on patient care (including telephone call/ virtual visit): 20 minutes  [place], [place] [phone]

## 2019-08-28 LAB — FECAL OCCULT BLOOD, IMMUNOCHEMICAL: Fecal Occult Bld: POSITIVE — AB

## 2019-08-28 NOTE — Progress Notes (Signed)
Amanda [name], can we bump this patient up to an ASAP appointment (first available, any APP)?  Thanks, Randall Hiss

## 2019-08-28 NOTE — Progress Notes (Signed)
Thanks for the heads up! I've sent to our front desk to get her bumped up ASAP with first available appointment. We'll call her with scheduling and get her evaluated  Randall Hiss, NP.

## 2019-08-30 ENCOUNTER — Other Ambulatory Visit: Payer: Self-pay

## 2019-08-30 ENCOUNTER — Encounter: Payer: Self-pay | Admitting: Gastroenterology

## 2019-08-30 ENCOUNTER — Ambulatory Visit: Payer: Medicare HMO | Admitting: Gastroenterology

## 2019-08-30 VITALS — BP 138/76 | HR 73 | Temp 97.1°F | Ht 60.0 in | Wt 138.8 lb

## 2019-08-30 DIAGNOSIS — K625 Hemorrhage of anus and rectum: Secondary | ICD-10-CM | POA: Diagnosis not present

## 2019-08-30 NOTE — Progress Notes (Signed)
CC'ED TO PCP 

## 2019-08-30 NOTE — Progress Notes (Signed)
Referring Provider: [name], DO Primary Care Physician:  [name], DO Primary GI: Dr. Oneida Alar   Chief Complaint  Patient presents with  . Blood In Stools    HPI:   Amanda [name] is a 86 y.o. female presenting today with a history of GERD, constipation, history of rectal bleeding, undergoing EGD and colonoscopy Nov 2020 due to rectal bleeding and reports of melena. Likely source of hematochezia diverticular or related to internal hemorrhoids. EGD without obvious source. Returns today due to recurrent large volume bleeding reported. Her las Hgb was 12.9 in Jan 2021.   Fell Monday and Wednesday. Losing her balance. Denies neuropathy. Has bruise on her backside. Fell between bed and drawers. Son had to pull her up. Recently has noticed increase in falls, now every other day. No dizziness. When getting up Wednesday, was turning around to go to bathroom and fell. Rug in bedroom doesn't slide. No edges that are raised. Hgb was 12.9 in Jan 2021.   Noticed bleeding Friday-Tuesday. Tuesday bleeding stopped. Was black and red. States clots were in the toilet. [place] clots. When bleeding started, was having stool 3-4 times per day. No abdominal pain or cramping. No rectal burning or itching. Started off large amounts and got less and less. Stool has been black on iron. Sometimes constipated. Takes stool softener as needed. Was prescribed Anusol suppositories by PCP.   Ran out of aspirin and took Ibuprofen last week. Believes bleeding started after this. Only one day of taking Ibuprofen.   Prior to this, had no bleeding.    Past Medical History:  Diagnosis Date  . Allergy   . Anxiety   . Coronary artery disease    LAD 30% followed by 60% stenosis; pressure wire measurement demonstrated no significant gradient; circumflex had 30% stenosis, right coronary artery has 40% stenosed; EF 75-80%  . Diverticulosis   . GERD (gastroesophageal reflux disease)   . Hematuria   .  Hyperlipidemia   . Hypertension   . Impaired renal function 04/23/2018  . Stroke [facility]) 2003   With a left MCA occlusion  . TIA (transient ischemic attack)     Past Surgical History:  Procedure Laterality Date  . ABDOMINAL HYSTERECTOMY     partial and complete - x  surgeries  . APPENDECTOMY    . CHOLECYSTECTOMY    . COLONOSCOPY N/A 04/19/2019   Procedure: COLONOSCOPY;  Surgeon: Danie Binder, MD;  [facility];  Service: Endoscopy;  Laterality: N/A;  1:00pm-office rescheduled 11/13 @ 10:30am  . ESOPHAGOGASTRODUODENOSCOPY N/A 04/19/2019   Procedure: ESOPHAGOGASTRODUODENOSCOPY (EGD);  Surgeon: Danie Binder, MD;  [facility];  Service: Endoscopy;  Laterality: N/A;  . EYE SURGERY Bilateral    cataracts  . Hysterectomy-type unspecified    . POLYPECTOMY  04/19/2019   Procedure: POLYPECTOMY;  Surgeon: Danie Binder, MD;  [facility];  Service: Endoscopy;;  colon   . TONSILLECTOMY      Current Outpatient Medications  Medication Sig Dispense Refill  . aspirin EC 81 MG tablet Take 81 mg by mouth daily.     Marland Kitchen atorvastatin (LIPITOR) 20 MG tablet Take 1 tablet (20 mg total) by mouth at bedtime. Needs to be seen before next refill 30 tablet 0  . docusate sodium (COLACE) 100 MG capsule Take 100 mg by mouth as needed.     . hydrocortisone (ANUSOL-HC) 25 MG suppository Place 1 suppository (25 mg total) rectally 2 (two)  times daily. 12 suppository 0  . iron polysaccharides (NIFEREX) 150 MG capsule Take 150 mg by mouth daily.    Marland Kitchen loratadine (CLARITIN) 10 MG tablet Take 1 tablet (10 mg total) by mouth daily. 90 tablet 3  . Multiple Vitamins-Minerals (PRESERVISION AREDS 2) CAPS Take 1 capsule by mouth 2 (two) times a day. 180 capsule 2  . omeprazole (PRILOSEC) 20 MG capsule Take 1 capsule (20 mg total) by mouth 2 (two) times daily before a meal. 180 capsule 3  . telmisartan (MICARDIS) 80 MG tablet TAKE 1 TABLET EVERY DAY 90 tablet 1  . trimethoprim (TRIMPEX)  100 MG tablet Take 1 tablet (100 mg total) by mouth at bedtime as needed. 30 tablet 11  . verapamil (CALAN-SR) 180 MG CR tablet TAKE 1 TABLET AT BEDTIME (REPLACES 120MG ).  Needs to be seen for future refills. 30 tablet 0   No current facility-administered medications for this visit.    Allergies as of 08/30/2019 - Review Complete 08/30/2019  Allergen Reaction Noted  . Livalo [pitavastatin] Other (See Comments) 05/19/2012  . Simvastatin Other (See Comments) 05/19/2012  . Lisinopril  09/14/2017    Family History  Problem Relation Age of Onset  . Colon cancer Mother 76       dx on colonoscopy   . Cancer Father        unsure if cancer  --- tumor on brain  . Heart attack Sister   . Heart attack Brother   . Diabetes Brother   . GI problems Son   . COPD Sister   . Hypertension Brother   . Heart disease Brother   . Heart attack Brother   . Heart disease Brother   . Alcohol abuse Brother     Social History   Socioeconomic History  . Marital status: Widowed    Spouse name: Not on file  . Number of children: 2  . Years of education: 29  . Highest education level: High school graduate  Occupational History  . Occupation: Horticulturist, commercial: RETIRED    Comment: Retired  Tobacco Use  . Smoking status: Former Smoker    Quit date: 06/06/1985    Years since quitting: 34.2  . Smokeless tobacco: Never Used  . Tobacco comment: Approximately 10-pack-year history  Substance and Sexual Activity  . Alcohol use: No  . Drug use: No  . Sexual activity: Not on file  Other Topics Concern  . Not on file  Social History Narrative   Lives in [place] with her son.   Has been widowed for about 3 years.   Social Determinants of Health   Financial Resource Strain: Low Risk   . Difficulty of Paying Living Expenses: Not very hard  Food Insecurity: No Food Insecurity  . Worried About Charity fundraiser in the Last Year: Never true  . Ran Out of Food in the Last Year: Never true    Transportation Needs: No Transportation Needs  . Lack of Transportation (Medical): No  . Lack of Transportation (Non-Medical): No  Physical Activity: Inactive  . Days of Exercise per Week: 0 days  . Minutes of Exercise per Session: 0 min  Stress: No Stress Concern Present  . Feeling of Stress : Only a little  Social Connections: Slightly Isolated  . Frequency of Communication with Friends and Family: More than three times a week  . Frequency of Social Gatherings with Friends and Family: More than three times a week  . Attends Religious Services: More  than 4 times per year  . Active Member of Clubs or Organizations: Yes  . Attends Archivist Meetings: More than 4 times per year  . Marital Status: Widowed    Review of Systems: Gen: see HPI CV: Denies chest pain, palpitations, syncope, peripheral edema, and claudication. Resp: Denies dyspnea at rest, cough, wheezing, coughing up blood, and pleurisy. GI: see HPI Derm: Denies rash, itching, dry skin Psych: Denies depression, anxiety, memory loss, confusion. No homicidal or suicidal ideation.  Heme: see HPI  Physical Exam: BP 138/76   Pulse 73   Temp (!) 97.1 F (36.2 C) (Temporal)   Ht 5' (1.524 m)   Wt 138 lb 12.8 oz (63 kg)   BMI 27.11 kg/m  General:   Alert and oriented. No distress noted. Pleasant and cooperative.  Head:  Normocephalic and atraumatic. Eyes:  Conjuctiva clear without scleral icterus. Mouth:  Mask in place Abdomen:  +BS, soft, non-tender and non-distended. No rebound or guarding. No HSM or masses noted. Msk:  Symmetrical without gross deformities. Normal posture. Extremities:  Without edema. Neurologic:  Alert and  oriented x4 Psych:  Alert and cooperative. Normal mood and affect.  ASSESSMENT: Amanda [name] is a delightful 86 y.o. female presenting today with recent rectal bleeding that is suspicious for diverticular origin; interestingly, she noticed bleeding onset after running out of 81 mg  aspirin and taking Ibuprofen. She has now stopped Ibuprofen and remains only on 81 mg aspirin without any other NSAIDs. Continues with omeprazole BID.   Doubt ischemic etiology; she had no abdominal pain, and I suspect Ibuprofen could have precipitated bleeding. Volume of bleeding reported out of proportion for hemorrhoids. Reports of falls is concerning, and I wonder if she may be dealing with orthostatic hypotension. We are checking CBC today for stability. She will present to the ED if any recurrent large volume rectal bleeding.    PLAN:   Avoid any additional NSAIDs; only take 81 mg aspirin and continue PPI BID  CBC, iron studies today  If no anemia, needs to return to PCP for further evaluation of frequent falls  Continue to follow CBC, may need capsule if ongoing occult blood loss. Most recent episode likely diverticular in nature, unable to rule out ischemic but less likely due to symptom report.   Return in 3-4 months  To ED if recurrent large volume bleeding  Annitta Needs, PhD, ANP-BC [facility]

## 2019-08-30 NOTE — Patient Instructions (Signed)
Please go to the emergency room if you have any more significant bleeding like you did.  Continue to stay away from anything like Ibuprofen, Advil, Aleve, Motrin, BC powders, Goody powders. You can continue the baby aspirin daily.  You may take a stool softener daily to twice a day. Call if this is not helpful!  We are checking blood work today. We will call with the results!  We will see you in 3-4 months!  It was a pleasure to see you today. I want to create trusting relationships with patients to provide genuine, compassionate, and quality care. I value your feedback. If you receive a survey regarding your visit,  I greatly appreciate you taking time to fill this out.   Annitta Needs, PhD, ANP-BC [facility]

## 2019-08-31 LAB — IRON,TIBC AND FERRITIN PANEL
%SAT: 28 % (ref 16–45)
Ferritin: 23 ng/mL (ref 16–288)
Iron: 87 ug/dL (ref 45–160)
TIBC: 313 ug/dL (ref 250–450)

## 2019-08-31 LAB — CBC WITH DIFFERENTIAL/PLATELET
Absolute Monocytes: 858 {cells}/uL (ref 200–950)
Basophils Absolute: 62 {cells}/uL (ref 0–200)
Basophils Relative: 0.8 %
Eosinophils Absolute: 242 {cells}/uL (ref 15–500)
Eosinophils Relative: 3.1 %
HCT: 35.8 % (ref 35.0–45.0)
Hemoglobin: 11.8 g/dL (ref 11.7–15.5)
Lymphs Abs: 1771 {cells}/uL (ref 850–3900)
MCH: 32.2 pg (ref 27.0–33.0)
MCHC: 33 g/dL (ref 32.0–36.0)
MCV: 97.8 fL (ref 80.0–100.0)
MPV: 12.8 fL — ABNORMAL HIGH (ref 7.5–12.5)
Monocytes Relative: 11 %
Neutro Abs: 4867 {cells}/uL (ref 1500–7800)
Neutrophils Relative %: 62.4 %
Platelets: 169 Thousand/uL (ref 140–400)
RBC: 3.66 Million/uL — ABNORMAL LOW (ref 3.80–5.10)
RDW: 12.4 % (ref 11.0–15.0)
Total Lymphocyte: 22.7 %
WBC: 7.8 Thousand/uL (ref 3.8–10.8)

## 2019-09-03 ENCOUNTER — Telehealth: Payer: Self-pay | Admitting: Family Medicine

## 2019-09-03 NOTE — Chronic Care Management (AMB) (Signed)
  Care Management   Note  09/03/2019 Name: Haylynn SELEN FESS MRN: [id number] DOB: May 29, 1931  Monasia T Harms is a 86 y.o. year old female who is a primary care patient of [name], DO and is actively engaged with the care management team. I reached out to Corleen T Mones by phone today to assist with scheduling a follow up visit with the RN Case Manager  Follow up plan: Telephone appointment with care management team member scheduled for:09/20/2019  Noreene Larsson, [address], [place], [place] [address] Direct Dial: [phone] [email or web address]: [email or web address]

## 2019-09-05 ENCOUNTER — Other Ambulatory Visit: Payer: Self-pay

## 2019-09-05 ENCOUNTER — Ambulatory Visit (INDEPENDENT_AMBULATORY_CARE_PROVIDER_SITE_OTHER): Payer: Medicare HMO

## 2019-09-05 ENCOUNTER — Encounter: Payer: Self-pay | Admitting: Family Medicine

## 2019-09-05 ENCOUNTER — Ambulatory Visit (INDEPENDENT_AMBULATORY_CARE_PROVIDER_SITE_OTHER): Payer: Medicare HMO | Admitting: Family Medicine

## 2019-09-05 VITALS — BP 139/66 | HR 71 | Temp 96.2°F | Ht 60.0 in | Wt 139.2 lb

## 2019-09-05 DIAGNOSIS — S7002XA Contusion of left hip, initial encounter: Secondary | ICD-10-CM

## 2019-09-05 DIAGNOSIS — R296 Repeated falls: Secondary | ICD-10-CM

## 2019-09-05 DIAGNOSIS — R42 Dizziness and giddiness: Secondary | ICD-10-CM | POA: Diagnosis not present

## 2019-09-05 NOTE — Patient Instructions (Signed)
Get some compression hose and wear daily.  Change positions very slowly.  Hematoma A hematoma is a collection of blood. A hematoma can happen:  Under the skin.  In an organ.  In a body space.  In a joint space.  In other tissues. The blood can thicken (clot) to form a lump that you can see and feel. The lump is often hard and may become sore and tender. The lump can be very small or very big. Most hematomas get better in a few days to weeks. However, some hematomas may be serious and need medical care. What are the causes? This condition is caused by:  An injury.  Blood that leaks under the skin.  Problems from surgeries.  Medical conditions that cause bleeding or bruising. What increases the risk? You are more likely to develop this condition if:  You are an older adult.  You use medicines that thin your blood. What are the signs or symptoms? Symptoms depend on where the hematoma is in your body.  If the hematoma is under the skin, there is: ? A firm lump on the body. ? Pain and tenderness in the area. ? Bruising. The skin above the lump may be blue, dark blue, purple-red, or yellowish.  If the hematoma is deep in the tissues or body spaces, there may be: ? Blood in the stomach. This may cause pain in the belly (abdomen), weakness, passing out (fainting), and shortness of breath. ? Blood in the head. This may cause a headache, weakness, trouble speaking or understanding speech, or passing out. How is this diagnosed? This condition is diagnosed based on:  Your medical history.  A physical exam.  Imaging tests, such as ultrasound or CT scan.  Blood tests. How is this treated? Treatment depends on the cause, size, and location of the hematoma. Treatment may include:  Doing nothing. Many hematomas go away on their own without treatment.  Surgery or close monitoring. This may be needed for large hematomas or hematomas that affect the body's organs.  Medicines.  These may be given if a medical condition caused the hematoma. Follow these instructions at home: Managing pain, stiffness, and swelling   If told, put ice on the area. ? Put ice in a plastic bag. ? Place a towel between your skin and the bag. ? Leave the ice on for 20 minutes, 2-3 times a day for the first two days.  If told, put heat on the affected area after putting ice on the area for two days. Use the heat source that your doctor tells you to use. This could be a moist heat pack or a heating pad. To do this: ? Place a towel between your skin and the heat source. ? Leave the heat on for 20-30 minutes. ? Remove the heat if your skin turns bright red. This is very important if you are unable to feel pain, heat, or cold. You may have a greater risk of getting burned.  Raise (elevate) the affected area above the level of your heart while you are sitting or lying down.  Wrap the affected area with an elastic bandage, if told by your doctor. Do not wrap the bandage too tightly.  If your hematoma is on a leg or foot and is painful, your doctor may give you crutches. Use them as told by your doctor. General instructions  Take over-the-counter and prescription medicines only as told by your doctor.  Keep all follow-up visits as told by your  doctor. This is important. Contact a doctor if:  You have a fever.  The swelling or bruising gets worse.  You start to get more hematomas. Get help right away if:  Your pain gets worse.  Your pain is not getting better with medicine.  Your skin over the hematoma breaks or starts to bleed.  Your hematoma is in your chest or belly and you: ? Pass out. ? Feel weak. ? Become short of breath.  You have a hematoma on your scalp that is caused by a fall or injury, and you: ? Have a headache that gets worse. ? Have trouble speaking or understanding speech. ? Become less alert or you pass out. Summary  A hematoma is a collection of blood in  any part of your body.  Most hematomas get better on their own in a few days to weeks. Some may need medical care.  Follow instructions from your doctor about how to care for your hematoma.  Contact a doctor if the swelling or bruising gets worse, or if you are short of breath. This information is not intended to replace advice given to you by your health care provider. Make sure you discuss any questions you have with your health care provider. Document Revised: 10/26/2017 Document Reviewed: 10/26/2017 Elsevier Patient Education  2020 [employer].

## 2019-09-05 NOTE — Progress Notes (Signed)
Subjective: CC: Fall PCP: [name], DO [id number] Amanda [name] is a 86 y.o. female presenting to clinic today for:  1.  Fall Patient reports that she fell about 1 week ago when she was going around her bed to try and make the bed.  She fell between the bed and a dresser on her left hip.  She had subsequent large amount of swelling in the posterior left hip with bruising.  She not been doing anything to treat this.  She does report tenderness in this area.  She is ambulating independently.  She has intermittent dizziness which is often related to position changes.  She thinks that she is hydrating but not adequately.  Denies any vertiginous symptoms, sinus drainage, inner ear discomfort or hearing loss.  No unilateral weakness, sensation change, dysarthria, facial droop.   ROS: Per HPI  Allergies  Allergen Reactions  . Livalo [Pitavastatin] Other (See Comments)    Causes dizziness  . Simvastatin Other (See Comments)    Causes dizziness  . Lisinopril     Hallucinations, resolved on ARB   Past Medical History:  Diagnosis Date  . Allergy   . Anxiety   . Coronary artery disease    LAD 30% followed by 60% stenosis; pressure wire measurement demonstrated no significant gradient; circumflex had 30% stenosis, right coronary artery has 40% stenosed; EF 75-80%  . Diverticulosis   . GERD (gastroesophageal reflux disease)   . Hematuria   . Hyperlipidemia   . Hypertension   . Impaired renal function 04/23/2018  . Stroke [facility]) 2003   With a left MCA occlusion  . TIA (transient ischemic attack)     Current Outpatient Medications:  .  aspirin EC 81 MG tablet, Take 81 mg by mouth daily. , Disp: , Rfl:  .  atorvastatin (LIPITOR) 20 MG tablet, Take 1 tablet (20 mg total) by mouth at bedtime. Needs to be seen before next refill, Disp: 30 tablet, Rfl: 0 .  docusate sodium (COLACE) 100 MG capsule, Take 100 mg by mouth as needed. , Disp: , Rfl:  .  hydrocortisone (ANUSOL-HC) 25 MG suppository,  Place 1 suppository (25 mg total) rectally 2 (two) times daily., Disp: 12 suppository, Rfl: 0 .  iron polysaccharides (NIFEREX) 150 MG capsule, Take 150 mg by mouth daily., Disp: , Rfl:  .  loratadine (CLARITIN) 10 MG tablet, Take 1 tablet (10 mg total) by mouth daily., Disp: 90 tablet, Rfl: 3 .  Multiple Vitamins-Minerals (PRESERVISION AREDS 2) CAPS, Take 1 capsule by mouth 2 (two) times a day., Disp: 180 capsule, Rfl: 2 .  omeprazole (PRILOSEC) 20 MG capsule, Take 1 capsule (20 mg total) by mouth 2 (two) times daily before a meal., Disp: 180 capsule, Rfl: 3 .  telmisartan (MICARDIS) 80 MG tablet, TAKE 1 TABLET EVERY DAY, Disp: 90 tablet, Rfl: 1 .  trimethoprim (TRIMPEX) 100 MG tablet, Take 1 tablet (100 mg total) by mouth at bedtime as needed., Disp: 30 tablet, Rfl: 11 .  verapamil (CALAN-SR) 180 MG CR tablet, TAKE 1 TABLET AT BEDTIME (REPLACES 120MG ).  Needs to be seen for future refills., Disp: 30 tablet, Rfl: 0 Social History   Socioeconomic History  . Marital status: Widowed    Spouse name: Not on file  . Number of children: 2  . Years of education: 81  . Highest education level: High school graduate  Occupational History  . Occupation: Horticulturist, commercial: RETIRED    Comment: Retired  Tobacco Use  .  Smoking status: Former Smoker    Quit date: 06/06/1985    Years since quitting: 34.2  . Smokeless tobacco: Never Used  . Tobacco comment: Approximately 10-pack-year history  Substance and Sexual Activity  . Alcohol use: No  . Drug use: No  . Sexual activity: Not on file  Other Topics Concern  . Not on file  Social History Narrative   Lives in [place] with her son.   Has been widowed for about 3 years.   Social Determinants of Health   Financial Resource Strain: Low Risk   . Difficulty of Paying Living Expenses: Not very hard  Food Insecurity: No Food Insecurity  . Worried About Charity fundraiser in the Last Year: Never true  . Ran Out of Food in the Last Year: Never true    Transportation Needs: No Transportation Needs  . Lack of Transportation (Medical): No  . Lack of Transportation (Non-Medical): No  Physical Activity: Inactive  . Days of Exercise per Week: 0 days  . Minutes of Exercise per Session: 0 min  Stress: No Stress Concern Present  . Feeling of Stress : Only a little  Social Connections: Slightly Isolated  . Frequency of Communication with Friends and Family: More than three times a week  . Frequency of Social Gatherings with Friends and Family: More than three times a week  . Attends Religious Services: More than 4 times per year  . Active Member of Clubs or Organizations: Yes  . Attends Archivist Meetings: More than 4 times per year  . Marital Status: Widowed  Intimate Partner Violence: Not At Risk  . Fear of Current or Ex-Partner: No  . Emotionally Abused: No  . Physically Abused: No  . Sexually Abused: No   Family History  Problem Relation Age of Onset  . Colon cancer Mother 10       dx on colonoscopy   . Cancer Father        unsure if cancer  --- tumor on brain  . Heart attack Sister   . Heart attack Brother   . Diabetes Brother   . GI problems Son   . COPD Sister   . Hypertension Brother   . Heart disease Brother   . Heart attack Brother   . Heart disease Brother   . Alcohol abuse Brother     Objective: Office vital signs reviewed. BP 139/66   Pulse 71   Temp (!) 96.2 F (35.7 C)   Ht 5' (1.524 m)   Wt 139 lb 4 oz (63.2 kg)   SpO2 97%   BMI 27.20 kg/m   Physical Examination:  General: Awake, alert, well nourished, No acute distress HEENT: Normal, sclera. EOMI, PERRLA Cardio: regular rate and rhythm, S1S2 heard, no murmurs appreciated Pulm: clear to auscultation bilaterally, no wheezes, rhonchi or rales; normal work of breathing on room air Extremities: warm, well perfused, No edema, cyanosis or clubbing; +2 pulses bilaterally MSK: Normal Skin: Large hematoma noted on the left posterior hip with  surrounding healing ecchymosis.  No ulceration. Neuro: Strength, light touch sensation and DTRs are intact.  Cranial nerves II through XII grossly intact.  No focal neurologic deficits.  Alert and oriented x3.  Negative Romberg.  Negative upper extremity and lower extremity cerebellar testing  Assessment/ Plan: 86 y.o. female   1. Hematoma of left hip, initial encounter Xray obtained.  No ulceration or skin breakdown of hematoma.  Care structures were reviewed. - DG Hip Unilat W OR  W/O Pelvis 2-3 Views Left; Future  2. Frequent falls No neurologic deficits.  Encourage compression hose, hydration.  While her orthostatics were normal here the symptoms that she experiencing occur when she is changing positions which suggest orthostasis.  Reinforce low change of positions - DG Hip Unilat W OR W/O Pelvis 2-3 Views Left; Future  3. Dizziness As above   Orders Placed This Encounter  Procedures  . DG Hip Unilat W OR W/O Pelvis 2-3 Views Left    Standing Status:   Future    Standing Expiration Date:   11/04/2020    Order Specific Question:   Reason for Exam (SYMPTOM  OR DIAGNOSIS REQUIRED)    Answer:   fall, left hip hematoma    Order Specific Question:   Preferred imaging location?    Answer:   Internal    Order Specific Question:   Radiology Contrast Protocol - do NOT remove file path    Answer:   [email or web address]   No orders of the defined types were placed in this encounter.    [name], DO [place] [phone]

## 2019-09-10 ENCOUNTER — Ambulatory Visit (INDEPENDENT_AMBULATORY_CARE_PROVIDER_SITE_OTHER): Payer: Medicare HMO

## 2019-09-10 DIAGNOSIS — Z0000 Encounter for general adult medical examination without abnormal findings: Secondary | ICD-10-CM

## 2019-09-10 NOTE — Progress Notes (Signed)
MEDICARE ANNUAL WELLNESS VISIT  09/10/2019  Telephone Visit Disclaimer This Medicare AWV was conducted by telephone due to national recommendations for restrictions regarding the COVID-19 Pandemic (e.g. social distancing).  I verified, using two identifiers, that I am speaking with Amanda [name] or their authorized healthcare agent. I discussed the limitations, risks, security, and privacy concerns of performing an evaluation and management service by telephone and the potential availability of an in-person appointment in the future. The patient expressed understanding and agreed to proceed.   Subjective:  Amanda [name] is a 86 y.o. female patient of [name], DO who had a Medicare Annual Wellness Visit today via telephone. Amanda [name] is Retired and resides with her son who lives in her basement.  She has two children. She reports that she is socially active and does interact with friends/family regularly. she is minimally physically active and enjoys volunteering two to three days per week at the [employer] and also working on jigsaw puzzles.  Patient Care Team: [name], DO as PCP - General (Family Medicine) Cathe Mons, MD (Internal Medicine) Harlen Labs, MD as Referring Physician (Optometry) [place], Delice Bison, RN as Registered Nurse Danie Binder, MD as Consulting Physician (Gastroenterology)  Advanced Directives 09/10/2019 04/19/2019 08/07/2017  Does Patient Have a Medical Advance Directive? Yes Yes No;Yes  Type of Paramedic of [place];Living will [place];Living will  Does patient want to make changes to medical advance directive? No - Patient declined - No - Patient declined  Copy of [place] in Chart? No - copy requested - No - copy requested  Would patient like information on creating a medical advance directive? - - No - Patient declined    Hospital  Utilization Over the Past 12 Months: # of hospitalizations or ER visits: 0 # of surgeries: 0  Review of Systems    Patient reports that her overall health is unchanged compared to last year.  History obtained from chart review and the patient  Patient Reported Readings (BP, Pulse, CBG, Weight, etc) none  Pain Assessment Pain : 0-10 Pain Score: 2  Pain Type: Acute pain Pain Location: Buttocks Pain Orientation: Left Pain Descriptors / Indicators: Dull Pain Onset: 1 to 4 weeks ago Pain Frequency: Occasional Pain Relieving Factors: Tylenol Effect of Pain on Daily Activities: none  Pain Relieving Factors: Tylenol  Current Medications & Allergies (verified) Allergies as of 09/10/2019      Reactions   Livalo [pitavastatin] Other (See Comments)   Causes dizziness   Simvastatin Other (See Comments)   Causes dizziness   Lisinopril    Hallucinations, resolved on ARB      Medication List       Accurate as of September 10, 2019  8:41 AM. If you have any questions, ask your nurse or doctor.        aspirin EC 81 MG tablet Take 81 mg by mouth daily.   atorvastatin 20 MG tablet Commonly known as: LIPITOR Take 1 tablet (20 mg total) by mouth at bedtime. Needs to be seen before next refill   docusate sodium 100 MG capsule Commonly known as: COLACE Take 100 mg by mouth as needed.   hydrocortisone 25 MG suppository Commonly known as: ANUSOL-HC Place 1 suppository (25 mg total) rectally 2 (two) times daily.   iron polysaccharides 150 MG capsule Commonly known as: NIFEREX Take 150 mg by mouth daily.  loratadine 10 MG tablet Commonly known as: CLARITIN Take 1 tablet (10 mg total) by mouth daily.   omeprazole 20 MG capsule Commonly known as: PRILOSEC Take 1 capsule (20 mg total) by mouth 2 (two) times daily before a meal.   PreserVision AREDS 2 Caps Take 1 capsule by mouth 2 (two) times a day.   telmisartan 80 MG tablet Commonly known as: MICARDIS TAKE 1 TABLET EVERY  DAY   trimethoprim 100 MG tablet Commonly known as: TRIMPEX Take 1 tablet (100 mg total) by mouth at bedtime as needed.   verapamil 180 MG CR tablet Commonly known as: CALAN-SR TAKE 1 TABLET AT BEDTIME (REPLACES 120MG ).  Needs to be seen for future refills.       History (reviewed): Past Medical History:  Diagnosis Date  . Allergy   . Anxiety   . Coronary artery disease    LAD 30% followed by 60% stenosis; pressure wire measurement demonstrated no significant gradient; circumflex had 30% stenosis, right coronary artery has 40% stenosed; EF 75-80%  . Diverticulosis   . GERD (gastroesophageal reflux disease)   . Hematuria   . Hyperlipidemia   . Hypertension   . Impaired renal function 04/23/2018  . Stroke [facility]) 2003   With a left MCA occlusion  . TIA (transient ischemic attack)    Past Surgical History:  Procedure Laterality Date  . ABDOMINAL HYSTERECTOMY     partial and complete - x  surgeries  . APPENDECTOMY    . CHOLECYSTECTOMY    . COLONOSCOPY N/A 04/19/2019   TI normal, four sessile polyps, 2-6 mm in size, multiple small and large-mouthed diverticula in entire colon, external and internal hemorrhoids, (tubular adenoma, sessile serrated polyp without dysplasia)  . ESOPHAGOGASTRODUODENOSCOPY N/A 04/19/2019   mild schatzki ring s/p dilation, multiple gastric polyps (fundic), non-bleeding duodenal diverticulum, benign small bowel, no villous, no dysplasia)  . EYE SURGERY Bilateral    cataracts  . Hysterectomy-type unspecified    . POLYPECTOMY  04/19/2019   Procedure: POLYPECTOMY;  Surgeon: Danie Binder, MD;  [facility];  Service: Endoscopy;;  colon   . TONSILLECTOMY     Family History  Problem Relation Age of Onset  . Colon cancer Mother 36       dx on colonoscopy   . Cancer Mother        colon  . Cancer Father        unsure if cancer  --- tumor on brain  . Heart attack Sister   . Heart attack Brother   . Diabetes Brother   . GI problems Son    . COPD Sister   . Hypertension Brother   . Heart disease Brother   . Heart attack Brother   . Heart disease Brother   . Alcohol abuse Brother    Social History   Socioeconomic History  . Marital status: Widowed    Spouse name: Not on file  . Number of children: 2  . Years of education: 19  . Highest education level: High school graduate  Occupational History  . Occupation: Horticulturist, commercial: RETIRED    Comment: Retired  Tobacco Use  . Smoking status: Former Smoker    Packs/day: 0.25    Years: 3.00    Pack years: 0.75    Quit date: 06/06/1985    Years since quitting: 34.2  . Smokeless tobacco: Never Used  . Tobacco comment: Approximately 10-pack-year history  Substance and Sexual Activity  . Alcohol use: No  .  Drug use: No  . Sexual activity: Not Currently  Other Topics Concern  . Not on file  Social History Narrative   Lives in [place] with her son.   Has been widowed for about 3 years.   Social Determinants of Health   Financial Resource Strain: Low Risk   . Difficulty of Paying Living Expenses: Not very hard  Food Insecurity: No Food Insecurity  . Worried About Charity fundraiser in the Last Year: Never true  . Ran Out of Food in the Last Year: Never true  Transportation Needs: No Transportation Needs  . Lack of Transportation (Medical): No  . Lack of Transportation (Non-Medical): No  Physical Activity: Inactive  . Days of Exercise per Week: 0 days  . Minutes of Exercise per Session: 0 min  Stress: No Stress Concern Present  . Feeling of Stress : Only a little  Social Connections: Slightly Isolated  . Frequency of Communication with Friends and Family: More than three times a week  . Frequency of Social Gatherings with Friends and Family: More than three times a week  . Attends Religious Services: More than 4 times per year  . Active Member of Clubs or Organizations: Yes  . Attends Archivist Meetings: More than 4 times per year  . Marital  Status: Widowed    Activities of Daily Living In your present state of health, do you have any difficulty performing the following activities: 09/10/2019  Hearing? N  Vision? N  Difficulty concentrating or making decisions? N  Walking or climbing stairs? Y  Comment some difficulty with stairs  Dressing or bathing? N  Doing errands, shopping? N  Preparing Food and eating ? N  Using the Toilet? N  In the past six months, have you accidently leaked urine? Y  Comment wears a Poise pad  Do you have problems with loss of bowel control? N  Managing your Medications? N  Managing your Finances? N  Housekeeping or managing your Housekeeping? N  Some recent data might be hidden    Patient Education/ Literacy How often do you need to have someone help you when you read instructions, pamphlets, or other written materials from your doctor or pharmacy?: 1 - Never What is the last grade level you completed in school?: 12th  Exercise Current Exercise Habits: The patient does not participate in regular exercise at present, Exercise limited by: None identified  Diet Patient reports consuming 3 meals a day and 1 snack(s) a day Patient reports that her primary diet is: Regular Patient reports that she does have regular access to food.   Depression Screen PHQ 2/9 Scores 09/10/2019 05/07/2019 02/06/2019 01/29/2019 11/06/2018 08/14/2018 07/20/2018  PHQ - 2 Score 0 0 2 1 0 0 0  PHQ- 9 Score - 0 5 - 0 - 0     Fall Risk Fall Risk  09/10/2019 05/07/2019 02/20/2019 02/06/2019 11/06/2018  Falls in the past year? 1 0 0 0 1  Number falls in past yr: 0 - - - 0  Injury with Fall? 1 - - - 0  Comment Hip pain - - - -     Objective:  Amanda [name] seemed alert and oriented and she participated appropriately during our telephone visit.  Blood Pressure Weight BMI  BP Readings from Last 3 Encounters:  09/05/19 139/66  08/30/19 138/76  06/19/19 (!) 161/73   Wt Readings from Last 3 Encounters:  09/05/19 139 lb 4 oz (63.2  kg)  08/30/19 138 lb  12.8 oz (63 kg)  06/19/19 135 lb (61.2 kg)   BMI Readings from Last 1 Encounters:  09/05/19 27.20 kg/m    *Unable to obtain current vital signs, weight, and BMI due to telephone visit type  Hearing/Vision  . Amanda [name] did not seem to have difficulty with hearing/understanding during the telephone conversation . Reports that she has not had a formal eye exam by an eye care professional within the past year . Reports that she has not had a formal hearing evaluation within the past year *Unable to fully assess hearing and vision during telephone visit type  Cognitive Function: 6CIT Screen 09/10/2019 09/10/2019  What Year? - 0 points  What month? - 0 points  What time? - 0 points  Count back from 20 - 2 points  Months in reverse 4 points 2 points  Repeat phrase 2 points -   (Normal:0-7, Significant for Dysfunction: >8)  Normal Cognitive Function Screening: Yes   Immunization & Health Maintenance Record Immunization History  Administered Date(s) Administered  . Fluad Quad(high Dose 65+) 02/06/2019  . Influenza, High Dose Seasonal PF 04/23/2018  . Moderna SARS-COVID-2 Vaccination 08/20/2019  . Pneumococcal Conjugate-13 09/14/2017  . Pneumococcal Polysaccharide-23 11/06/2018    Health Maintenance  Topic Date Due  . TETANUS/TDAP  Never done  . INFLUENZA VACCINE  01/05/2020  . DEXA SCAN  Completed  . PNA vac Low Risk Adult  Completed       Assessment  This is a routine wellness examination for Amanda [name].  Health Maintenance: Due or Overdue Health Maintenance Due  Topic Date Due  . TETANUS/TDAP  Never done    Amanda [name] does not need a referral for [employer] Assistance: Care Management:   no Social Work:    no Prescription Assistance:  no Nutrition/Diabetes Education:  no   Plan:  Personalized Goals Goals Addressed            This Visit's Progress   . Patient Stated       09/10/2019 AWV Goal: Exercise for General Health   Patient will  verbalize understanding of the benefits of increased physical activity:  Exercising regularly is important. It will improve your overall fitness, flexibility, and endurance.  Regular exercise also will improve your overall health. It can help you control your weight, reduce stress, and improve your bone density.  Over the next year, patient will increase physical activity as tolerated with a goal of at least 150 minutes of moderate physical activity per week.   You can tell that you are exercising at a moderate intensity if your heart starts beating faster and you start breathing faster but can still hold a conversation.  Moderate-intensity exercise ideas include:  Walking 1 mile (1.6 km) in about 15 minutes  Biking  Hiking  Golfing  Dancing  Water aerobics  Patient will verbalize understanding of everyday activities that increase physical activity by providing examples like the following: ? Yard work, such as: ? Pushing a Conservation officer, nature ? Raking and bagging leaves ? Washing your car ? Pushing a stroller ? Shoveling snow ? Gardening ? Washing windows or floors  Patient will be able to explain general safety guidelines for exercising:   Before you start a new exercise program, talk with your health care provider.  Do not exercise so much that you hurt yourself, feel dizzy, or get very short of breath.  Wear comfortable clothes and wear shoes with good support.  Drink plenty of water while you exercise to  prevent dehydration or heat stroke.  Work out until your breathing and your heartbeat get faster.       Personalized Health Maintenance & Screening Recommendations  Td vaccine  Lung Cancer Screening Recommended: no (Low Dose CT Chest recommended if Age 22-80 years, 30 pack-year currently smoking OR have quit w/in past 15 years) Hepatitis C Screening recommended: no HIV Screening recommended: no  Advanced Directives: Written information was not prepared per patient's  request.  Referrals & Orders No orders of the defined types were placed in this encounter.   Follow-up Plan . Follow-up with [name], DO as planned   I have personally reviewed and noted the following in the patient's chart:   . Medical and social history . Use of alcohol, tobacco or illicit drugs  . Current medications and supplements . Functional ability and status . Nutritional status . Physical activity . Advanced directives . List of other physicians . Hospitalizations, surgeries, and ER visits in previous 12 months . Vitals . Screenings to include cognitive, depression, and falls . Referrals and appointments  In addition, I have reviewed and discussed with Amanda [name] certain preventive protocols, quality metrics, and best practice recommendations. A written personalized care plan for preventive services as well as general preventive health recommendations is available and can be mailed to the patient at her request.      Felicity Coyer, LPN    [id number]

## 2019-09-18 ENCOUNTER — Ambulatory Visit: Payer: Medicare HMO | Admitting: Urology

## 2019-09-18 ENCOUNTER — Encounter: Payer: Self-pay | Admitting: Urology

## 2019-09-18 ENCOUNTER — Other Ambulatory Visit: Payer: Self-pay

## 2019-09-18 VITALS — BP 151/74 | HR 80 | Temp 98.1°F | Ht 60.0 in | Wt 135.0 lb

## 2019-09-18 DIAGNOSIS — N3021 Other chronic cystitis with hematuria: Secondary | ICD-10-CM

## 2019-09-18 DIAGNOSIS — N3281 Overactive bladder: Secondary | ICD-10-CM

## 2019-09-18 LAB — POCT URINALYSIS DIPSTICK
Bilirubin, UA: NEGATIVE
Blood, UA: NEGATIVE
Glucose, UA: NEGATIVE
Nitrite, UA: NEGATIVE
Protein, UA: POSITIVE — AB
Spec Grav, UA: 1.02 (ref 1.010–1.025)
Urobilinogen, UA: NEGATIVE E.U./dL — AB
pH, UA: 6 (ref 5.0–8.0)

## 2019-09-18 MED ORDER — MIRABEGRON ER 25 MG PO TB24: 25.0000 mg | ORAL_TABLET | Freq: Every day | ORAL | 1 refills | Status: DC

## 2019-09-18 MED ORDER — TRIMETHOPRIM 100 MG PO TABS: 100.0000 mg | ORAL_TABLET | Freq: Every evening | ORAL | 3 refills | Status: DC

## 2019-09-18 NOTE — Patient Instructions (Signed)

## 2019-09-18 NOTE — Progress Notes (Signed)
Urological Symptom Review  Patient is experiencing the following symptoms: Frequent urination Get up at night to urinate Leakage of urine   Review of Systems  Gastrointestinal (upper)  : Negative for upper GI symptoms  Gastrointestinal (lower) : Constipation  Constitutional : Negative for symptoms  Skin: Negative for skin symptoms  Eyes: Negative for eye symptoms  Ear/Nose/Throat : Negative for Ear/Nose/Throat symptoms  Hematologic/Lymphatic: Negative for Hematologic/Lymphatic symptoms  Cardiovascular : Negative for cardiovascular symptoms  Respiratory : Negative for respiratory symptoms  Endocrine: Negative for endocrine symptoms  Musculoskeletal: Negative for musculoskeletal symptoms  Neurological: Negative for neurological symptoms  Psychologic: Negative for psychiatric symptoms

## 2019-09-18 NOTE — Progress Notes (Signed)
09/18/2019 11:26 AM   Amanda [name] 1930/10/17 [id number]  Referring provider: [name], DO [place],  [place] [address]  Urge incontinence and recurrent UTI  HPI: Amanda [name] is a [id number] here for followup for recurrent UTIs. NO UTI since last visit. She is on trimethoprim 100mg . No dysuria. She has urge incontinence 6-7x per day. She uses 2 pads which are wet. Nocturia 1x. Urinary frequency q 60 minutes. Mild SUI.    PMH: Past Medical History:  Diagnosis Date  . Allergy   . Anxiety   . Coronary artery disease    LAD 30% followed by 60% stenosis; pressure wire measurement demonstrated no significant gradient; circumflex had 30% stenosis, right coronary artery has 40% stenosed; EF 75-80%  . Diverticulosis   . GERD (gastroesophageal reflux disease)   . Hematuria   . Hyperlipidemia   . Hypertension   . Impaired renal function 04/23/2018  . Stroke [facility]) 2003   With a left MCA occlusion  . TIA (transient ischemic attack)     Surgical History: Past Surgical History:  Procedure Laterality Date  . ABDOMINAL HYSTERECTOMY     partial and complete - x  surgeries  . APPENDECTOMY    . CHOLECYSTECTOMY    . COLONOSCOPY N/A 04/19/2019   TI normal, four sessile polyps, 2-6 mm in size, multiple small and large-mouthed diverticula in entire colon, external and internal hemorrhoids, (tubular adenoma, sessile serrated polyp without dysplasia)  . ESOPHAGOGASTRODUODENOSCOPY N/A 04/19/2019   mild schatzki ring s/p dilation, multiple gastric polyps (fundic), non-bleeding duodenal diverticulum, benign small bowel, no villous, no dysplasia)  . EYE SURGERY Bilateral    cataracts  . Hysterectomy-type unspecified    . POLYPECTOMY  04/19/2019   Procedure: POLYPECTOMY;  Surgeon: Danie Binder, MD;  [facility];  Service: Endoscopy;;  colon   . TONSILLECTOMY      Home Medications:  Allergies as of 09/18/2019      Reactions   Livalo [pitavastatin] Other (See Comments)     Causes dizziness   Simvastatin Other (See Comments)   Causes dizziness   Lisinopril    Hallucinations, resolved on ARB      Medication List       Accurate as of September 18, 2019 11:26 AM. If you have any questions, ask your nurse or doctor.        aspirin EC 81 MG tablet Take 81 mg by mouth daily.   atorvastatin 20 MG tablet Commonly known as: LIPITOR Take 1 tablet (20 mg total) by mouth at bedtime. Needs to be seen before next refill   docusate sodium 100 MG capsule Commonly known as: COLACE Take 100 mg by mouth as needed.   hydrocortisone 25 MG suppository Commonly known as: ANUSOL-HC Place 1 suppository (25 mg total) rectally 2 (two) times daily.   iron polysaccharides 150 MG capsule Commonly known as: NIFEREX Take 150 mg by mouth daily.   loratadine 10 MG tablet Commonly known as: CLARITIN Take 1 tablet (10 mg total) by mouth daily.   omeprazole 20 MG capsule Commonly known as: PRILOSEC Take 1 capsule (20 mg total) by mouth 2 (two) times daily before a meal.   PreserVision AREDS 2 Caps Take 1 capsule by mouth 2 (two) times a day.   telmisartan 80 MG tablet Commonly known as: MICARDIS TAKE 1 TABLET EVERY DAY   trimethoprim 100 MG tablet Commonly known as: TRIMPEX Take 1 tablet (100 mg total) by mouth at bedtime as  needed.   verapamil 180 MG CR tablet Commonly known as: CALAN-SR TAKE 1 TABLET AT BEDTIME (REPLACES 120MG ).  Needs to be seen for future refills.       Allergies:  Allergies  Allergen Reactions  . Livalo [Pitavastatin] Other (See Comments)    Causes dizziness  . Simvastatin Other (See Comments)    Causes dizziness  . Lisinopril     Hallucinations, resolved on ARB    Family History: Family History  Problem Relation Age of Onset  . Colon cancer Mother 1       dx on colonoscopy   . Cancer Mother        colon  . Cancer Father        unsure if cancer  --- tumor on brain  . Heart attack Sister   . Heart attack Brother   .  Diabetes Brother   . GI problems Son   . COPD Sister   . Hypertension Brother   . Heart disease Brother   . Heart attack Brother   . Heart disease Brother   . Alcohol abuse Brother     Social History:  reports that she quit smoking about 34 years ago. She has a 0.75 pack-year smoking history. She has never used smokeless tobacco. She reports that she does not drink alcohol or use drugs.  ROS: All other review of systems were reviewed and are negative except what is noted above in HPI  Physical Exam: BP (!) 151/74   Pulse 80   Temp 98.1 F (36.7 C)   Ht 5' (1.524 m)   Wt 135 lb (61.2 kg)   BMI 26.37 kg/m   Constitutional:  Alert and oriented, No acute distress. HEENT: [place] AT, moist mucus membranes.  Trachea midline, no masses. Cardiovascular: No clubbing, cyanosis, or edema. Respiratory: Normal respiratory effort, no increased work of breathing. GI: Abdomen is soft, nontender, nondistended, no abdominal masses GU: No CVA tenderness.  Lymph: No cervical or inguinal lymphadenopathy. Skin: No rashes, bruises or suspicious lesions. Neurologic: Grossly intact, no focal deficits, moving all 4 extremities. Psychiatric: Normal mood and affect.  Laboratory Data: Lab Results  Component Value Date   WBC 7.8 08/30/2019   HGB 11.8 08/30/2019   HCT 35.8 08/30/2019   MCV 97.8 08/30/2019   PLT 169 08/30/2019    Lab Results  Component Value Date   CREATININE 1.14 (H) 12/31/2018    No results found for: PSA  No results found for: TESTOSTERONE  Lab Results  Component Value Date   HGBA1C 5.6 04/10/2013    Urinalysis    Component Value Date/Time   COLORURINE YELLOW 04/10/2013 2115   APPEARANCEUR Cloudy (A) 05/07/2019 1017   LABSPEC 1.016 04/10/2013 2115   PHURINE 6.5 04/10/2013 2115   GLUCOSEU Negative 05/07/2019 1017   HGBUR NEGATIVE 04/10/2013 2115   BILIRUBINUR neg 09/18/2019 1113   BILIRUBINUR Negative 05/07/2019 1017   KETONESUR NEGATIVE 04/10/2013 2115    PROTEINUR Positive (A) 09/18/2019 1113   PROTEINUR 1+ (A) 05/07/2019 1017   PROTEINUR NEGATIVE 04/10/2013 2115   UROBILINOGEN negative (A) 09/18/2019 1113   UROBILINOGEN 1.0 04/10/2013 2115   NITRITE neg 09/18/2019 1113   NITRITE Positive (A) 05/07/2019 1017   NITRITE NEGATIVE 04/10/2013 2115   LEUKOCYTESUR Trace (A) 09/18/2019 1113   LEUKOCYTESUR 3+ (A) 05/07/2019 1017    Lab Results  Component Value Date   LABMICR See below: 05/07/2019   WBCUA >30 (A) 05/07/2019   RBCUA 11-30 (A) 08/14/2018   LABEPIT 0-10  05/07/2019   MUCUS Present 05/07/2019   BACTERIA Many (A) 05/07/2019    Pertinent Imaging:  No results found for this or any previous visit. No results found for this or any previous visit. No results found for this or any previous visit. No results found for this or any previous visit. Results for orders placed during the hospital encounter of 04/13/18  US RENAL   Narrative CLINICAL DATA:  Chronic kidney disease.  History of hypertension.  EXAM: RENAL / URINARY TRACT ULTRASOUND COMPLETE  COMPARISON:  None.  FINDINGS: Right Kidney:  Renal measurements: 9.9 x 4 x 4.9 cm = volume: 102 mL . Echogenicity within normal limits. No mass or hydronephrosis visualized.  Left Kidney:  Renal measurements: 9.6 x 3.9 x 5.5 cm = volume: 108 mL. Echogenicity within normal limits. No mass or hydronephrosis visualized.  Bladder:  Appears normal for degree of bladder distention. Ureteral jets not identified.  Included liver appears mildly echogenic seen with hepatocellular disease/steatosis.  IMPRESSION: Negative renal ultrasound.   Electronically Signed   By: Elon Alas M.D.   On: 04/13/2018 14:24    No results found for this or any previous visit. No results found for this or any previous visit. No results found for this or any previous visit.  Assessment & Plan:    1. Chronic cystitis with hematuria -continue trimethoprim 100mg  - POCT urinalysis  dipstick  2. OAB (overactive bladder) -mirabegron 25mg  daily   No follow-ups on file.  Nicolette Bang, MD  [facility] [place]

## 2019-09-20 ENCOUNTER — Ambulatory Visit (INDEPENDENT_AMBULATORY_CARE_PROVIDER_SITE_OTHER): Payer: Medicare HMO | Admitting: *Deleted

## 2019-09-20 DIAGNOSIS — K5900 Constipation, unspecified: Secondary | ICD-10-CM

## 2019-09-20 DIAGNOSIS — D509 Iron deficiency anemia, unspecified: Secondary | ICD-10-CM | POA: Diagnosis not present

## 2019-09-20 DIAGNOSIS — R42 Dizziness and giddiness: Secondary | ICD-10-CM

## 2019-09-20 NOTE — Chronic Care Management (AMB) (Signed)
Chronic Care Management   Follow Up Note   09/20/2019 Name: Amanda [name] MRN: [id number] DOB: [birth year]  Referred by: [name], DO Reason for referral : Chronic Care Management (RN Follow up)   Amanda [name] is a 86 y.o. year old female who is a primary care patient of [name], DO. The CCM team was consulted for assistance with chronic disease management and care coordination needs.    Review of patient status, including review of consultants reports, relevant laboratory and other test results, and collaboration with appropriate care team members and the patient's provider was performed as part of comprehensive patient evaluation and provision of chronic care management services.    SDOH (Social Determinants of Health) assessments performed: No See Care Plan activities for detailed interventions related to [facility])     Outpatient Encounter Medications as of 09/20/2019  Medication Sig  . aspirin EC 81 MG tablet Take 81 mg by mouth daily.   Marland Kitchen atorvastatin (LIPITOR) 20 MG tablet Take 1 tablet (20 mg total) by mouth at bedtime. Needs to be seen before next refill  . docusate sodium (COLACE) 100 MG capsule Take 100 mg by mouth as needed.   . hydrocortisone (ANUSOL-HC) 25 MG suppository Place 1 suppository (25 mg total) rectally 2 (two) times daily.  . iron polysaccharides (NIFEREX) 150 MG capsule Take 150 mg by mouth daily.  Marland Kitchen loratadine (CLARITIN) 10 MG tablet Take 1 tablet (10 mg total) by mouth daily.  . mirabegron ER (MYRBETRIQ) 25 MG TB24 tablet Take 1 tablet (25 mg total) by mouth daily.  . Multiple Vitamins-Minerals (PRESERVISION AREDS 2) CAPS Take 1 capsule by mouth 2 (two) times a day.  Marland Kitchen omeprazole (PRILOSEC) 20 MG capsule Take 1 capsule (20 mg total) by mouth 2 (two) times daily before a meal.  . telmisartan (MICARDIS) 80 MG tablet TAKE 1 TABLET EVERY DAY  . trimethoprim (TRIMPEX) 100 MG tablet Take 1 tablet (100 mg total) by mouth at bedtime.  . verapamil  (CALAN-SR) 180 MG CR tablet TAKE 1 TABLET AT BEDTIME (REPLACES 120MG ).  Needs to be seen for future refills.   No facility-administered encounter medications on file as of 09/20/2019.     RN Care Plan   . "I would like to keep from getting constipated" (pt-stated)       Constipation r/t to iron supplements in a patient with iron deficiency anemia.  Current Barriers:  . Chronic Disease Management support and education needs related to constipation.  Nurse Case Manager Clinical Goal(s):  Marland Kitchen Over the next 30 days, patient will demonstrate improved self-management by experiencing less frequent episodes of constipation.  . Over the next 30 days, patient will utilize OTC treatments and lifestyle modifications to decrease constipation.   Interventions:  . Chart reviewed including office notes, lab results, imaging reports, procedure reports . Talked with patient by telephone . Discussed intermittent constipation . Provided education on constipation r/t iron supplements . Discussed home treatments o Using glycerin suppositories . Education provided on constipation prevention and treatment . Reviewed and discussed medications: Colace is on patient's medication list but she didn't endorse taking it. I strongly encouraged her to colace, up to 3 caps a day.  . Encourage to increase water intake  Patient Self Care Activities:  . Performs ADL's independently . Performs IADL's independently  Please see past updates related to this goal by clicking on the "Past Updates" button in the selected goal       . Fall Prevention  Current Barriers:  Marland Kitchen Knowledge Deficits related to fall precautions . Decreased adherence to prescribed treatment for fall prevention . Orthostatic hypotension  Nurse Case Manager Clinical Goal(s):  Marland Kitchen Over the next 30 days, patient will verbalize using fall risk reduction strategies discussed . Over the next 90 days, patient will not experience additional  falls  Interventions:  . Provided written and verbal education re: Potential causes of falls and Fall prevention strategies . Reviewed medications and discussed potential side effects of medications such as dizziness and frequent urination . Assessed for s/s of orthostatic hypotension . Assessed for falls since last encounter. . Assessed patients knowledge of fall risk prevention secondary to previously provided education. . Encouraged patient to: Marland Kitchen De-clutter walkways . Change positions slowly . Wear secure fitting shoes at all times with ambulation . Utilize home lighting for dim lit areas . Have self and pet awareness at all times  Patient Self Care Activities:  . Able to perform most ADLs independently and has assistance from son  Initial goal documentation         Follow-up Plan:  The care management team will reach out to the patient again over the next 45 days.    [name], BSN, RN-BC Embedded Chronic Care Manager [facility] [place] [facility] Direct Dial: [phone]

## 2019-09-20 NOTE — Patient Instructions (Signed)
Visit Information  Goals Addressed            This Visit's Progress     Patient Stated   . COMPLETED: "I need to refill my iron pills" (pt-stated)       Current Barriers:  . Chronic Disease Management support and education needs related to anemia  Nurse Case Manager Clinical Goal(s):  Marland Kitchen Over the next 5 days, patient will talk with Dr Florentina Addison office and request a refill on niferex (iron supplement)  Interventions:  . Reviewed recent labs including CBC . Discussed her need for iron supplements . Discussed side effects of iron . Advised to continue taking iron as directed by provider . Advised to call her pharmacy or Dr Florentina Addison office for refills of Niferex and to let me know if there are any problems with getting a refill  Patient Self Care Activities:  . Performs ADL's independently . Performs IADL's independently  Initial goal documentation     . "I would like to keep from getting constipated" (pt-stated)       Constipation r/t to iron supplements in a patient with iron deficiency anemia.  Current Barriers:  . Chronic Disease Management support and education needs related to constipation.  Nurse Case Manager Clinical Goal(s):  Marland Kitchen Over the next 30 days, patient will demonstrate improved self-management by experiencing less frequent episodes of constipation.  . Over the next 30 days, patient will utilize OTC treatments and lifestyle modifications to decrease constipation.   Interventions:  . Chart reviewed including office notes, lab results, imaging reports, procedure reports . Talked with patient by telephone . Discussed intermittent constipation . Provided education on constipation r/t iron supplements . Discussed home treatments o Using glycerin suppositories . Education provided on constipation prevention and treatment . Reviewed and discussed medications: Colace is on patient's medication list but she didn't endorse taking it. I strongly encouraged her to  colace, up to 3 caps a day.  . Encourage to increase water intake  Patient Self Care Activities:  . Performs ADL's independently . Performs IADL's independently  Please see past updates related to this goal by clicking on the "Past Updates" button in the selected goal         Other   . Fall Prevention       Current Barriers:  Marland Kitchen Knowledge Deficits related to fall precautions . Decreased adherence to prescribed treatment for fall prevention . Orthostatic hypotension  Nurse Case Manager Clinical Goal(s):  Marland Kitchen Over the next 30 days, patient will verbalize using fall risk reduction strategies discussed . Over the next 90 days, patient will not experience additional falls  Interventions:  . Provided written and verbal education re: Potential causes of falls and Fall prevention strategies . Reviewed medications and discussed potential side effects of medications such as dizziness and frequent urination . Assessed for s/s of orthostatic hypotension . Assessed for falls since last encounter. . Assessed patients knowledge of fall risk prevention secondary to previously provided education. . Encouraged patient to: Marland Kitchen De-clutter walkways . Change positions slowly . Wear secure fitting shoes at all times with ambulation . Utilize home lighting for dim lit areas . Have self and pet awareness at all times  Patient Self Care Activities:  . Able to perform most ADLs independently and has assistance from son  Initial goal documentation        The patient verbalized understanding of instructions provided today and declined a print copy of patient instruction materials.   Follow-up Plan The  care management team will reach out to the patient again over the next 45 days.   [name], BSN, RN-BC Embedded Chronic Care Manager [facility] [place] [facility] Direct Dial: [phone]

## 2019-09-27 ENCOUNTER — Other Ambulatory Visit: Payer: Self-pay | Admitting: Family Medicine

## 2019-09-27 DIAGNOSIS — I10 Essential (primary) hypertension: Secondary | ICD-10-CM

## 2019-09-27 NOTE — Telephone Encounter (Signed)
Gottschalk. NTBS 30 days given 08/26/19

## 2019-09-30 NOTE — Telephone Encounter (Signed)
Left message for patient to call back to schedule and apt in order to get her refills.

## 2019-10-08 ENCOUNTER — Encounter: Payer: Self-pay | Admitting: Nurse Practitioner

## 2019-10-08 ENCOUNTER — Other Ambulatory Visit: Payer: Self-pay

## 2019-10-08 ENCOUNTER — Ambulatory Visit (INDEPENDENT_AMBULATORY_CARE_PROVIDER_SITE_OTHER): Payer: Medicare HMO | Admitting: Nurse Practitioner

## 2019-10-08 VITALS — BP 132/68 | HR 66 | Temp 97.6°F | Resp 20 | Ht 60.0 in | Wt 139.0 lb

## 2019-10-08 DIAGNOSIS — M79604 Pain in right leg: Secondary | ICD-10-CM | POA: Insufficient documentation

## 2019-10-08 NOTE — Patient Instructions (Addendum)
Patients right posterior leg pain is not well managed, mild swelling and insect sting like appearance with varicose vains on the popliteal fossa area. I  Provided education to patient to continue  compression socks for better/improve circulation, and anti inflammatory for pain.      Varicose Veins Varicose veins are veins that have become enlarged, bulged, and twisted. They most often appear in the legs. What are the causes? This condition is caused by damage to the valves in the vein. These valves help blood return to your heart. When they are damaged and they stop working properly, blood may flow backward and back up in the veins near the skin, causing the veins to get larger and appear twisted. The condition can result from any issue that causes blood to back up, like pregnancy, prolonged standing, or obesity. What increases the risk? This condition is more likely to develop in people who are:  On their feet a lot.  Pregnant.  Overweight. What are the signs or symptoms? Symptoms of this condition include:  Bulging, twisted, and bluish veins.  A feeling of heaviness. This may be worse at the end of the day.  Leg pain. This may be worse at the end of the day.  Swelling in the leg.  Changes in skin color over the veins. How is this diagnosed? This condition may be diagnosed based on your symptoms, a physical exam, and an ultrasound test. How is this treated? Treatment for this condition may involve:  Avoiding sitting or standing in one position for long periods of time.  Wearing compression stockings. These stockings help to prevent blood clots and reduce swelling in the legs.  Raising (elevating) the legs when resting.  Losing weight.  Exercising regularly. If you have persistent symptoms or want to improve the way your varicose veins look, you may choose to have a procedure to close the varicose veins off or to remove them. Treatments to close off the veins  include:  Sclerotherapy. In this treatment, a solution is injected into a vein to close it off.  Laser treatment. In this treatment, the vein is heated with a laser to close it off.  Radiofrequency vein ablation. In this treatment, an electrical current produced by radio waves is used to close off the vein. Treatments to remove the veins include:  Phlebectomy. In this treatment, the veins are removed through small incisions made over the veins.  Vein ligation and stripping. In this treatment, incisions are made over the veins. The veins are then removed after being tied (ligated) with stitches (sutures). Follow these instructions at home: Activity  Walk as much as possible. Walking increases blood flow. This helps blood return to the heart and takes pressure off your veins. It also increases your cardiovascular strength.  Follow your health care provider's instructions about exercising.  Do not stand or sit in one position for a long period of time.  Do not sit with your legs crossed.  Rest with your legs raised during the day. General instructions   Follow any diet instructions given to you by your health care provider.  Wear compression stockings as directed by your health care provider. Do not wear other kinds of tight clothing around your legs, pelvis, or waist.  Elevate your legs at night to above the level of your heart.  If you get a cut in the skin over the varicose vein and the vein bleeds: ? Lie down with your leg raised. ? Apply firm pressure to the  cut with a clean cloth until the bleeding stops. ? Place a bandage (dressing) on the cut. Contact a health care provider if:  The skin around your varicose veins starts to break down.  You have pain, redness, tenderness, or hard swelling over a vein.  You are uncomfortable because of pain.  You get a cut in the skin over a varicose vein and it will not stop bleeding. Summary  Varicose veins are veins that have  become enlarged, bulged, and twisted. They most often appear in the legs.  This condition is caused by damage to the valves in the vein. These valves help blood return to your heart.  Treatment for this condition includes frequent movements, wearing compression stockings, losing weight, and exercising regularly. In some cases, procedures are done to close off or remove the veins.  Treatment for this condition may include wearing compression stockings, elevating the legs, losing weight, and engaging in regular activity. In some cases, procedures are done to close off or remove the veins. This information is not intended to replace advice given to you by your health care provider. Make sure you discuss any questions you have with your health care provider. Document Revised: 07/19/2018 Document Reviewed: 06/15/2016 Elsevier Patient Education  [place].

## 2019-10-08 NOTE — Progress Notes (Addendum)
Acute Office Visit  Subjective:    Patient ID: Amanda [name], female    DOB: 27-Aug-1930, 86 y.o.   MRN: [id number]  Chief Complaint  Patient presents with  . episode of right leg pain    this happened Saturday = better now    HPI Patient is in today for right leg pain that started on saturday 3 days ago, pain is better now, patient describes pain episode as a quick onset with sharp stinging pain. Swelling followed few hours later but has subsided and gradually improved in the las 24-48 hours. She is rating her pain as a 0/10 today. Patient did not recall any insect bite or changes in activity that may have caused her pain and swelling.  She is visiting today to rule out a possible blood clot (DVT)  Past Medical History:  Diagnosis Date  . Allergy   . Anxiety   . Coronary artery disease    LAD 30% followed by 60% stenosis; pressure wire measurement demonstrated no significant gradient; circumflex had 30% stenosis, right coronary artery has 40% stenosed; EF 75-80%  . Diverticulosis   . GERD (gastroesophageal reflux disease)   . Hematuria   . Hyperlipidemia   . Hypertension   . Impaired renal function 04/23/2018  . Stroke [facility]) 2003   With a left MCA occlusion  . TIA (transient ischemic attack)     Past Surgical History:  Procedure Laterality Date  . ABDOMINAL HYSTERECTOMY     partial and complete - x  surgeries  . APPENDECTOMY    . CHOLECYSTECTOMY    . COLONOSCOPY N/A 04/19/2019   TI normal, four sessile polyps, 2-6 mm in size, multiple small and large-mouthed diverticula in entire colon, external and internal hemorrhoids, (tubular adenoma, sessile serrated polyp without dysplasia)  . ESOPHAGOGASTRODUODENOSCOPY N/A 04/19/2019   mild schatzki ring s/p dilation, multiple gastric polyps (fundic), non-bleeding duodenal diverticulum, benign small bowel, no villous, no dysplasia)  . EYE SURGERY Bilateral    cataracts  . Hysterectomy-type unspecified    . POLYPECTOMY  04/19/2019     Procedure: POLYPECTOMY;  Surgeon: Danie Binder, MD;  Location: [facility] SU[facility];  Service: Endoscopy;;  colon   . TONSILLECTOMY      Family History  Problem Relation Age of Onset  . Colon cancer Mother 52       dx on colonoscopy   . Cancer Mother        colon  . Cancer Father        unsure if cancer  --- tumor on brain  . Heart attack Sister   . Heart attack Brother   . Diabetes Brother   . GI problems Son   . COPD Sister   . Hypertension Brother   . Heart disease Brother   . Heart attack Brother   . Heart disease Brother   . Alcohol abuse Brother     Social History   Socioeconomic History  . Marital status: Widowed    Spouse name: Not on file  . Number of children: 2  . Years of education: 27  . Highest education level: High school graduate  Occupational History  . Occupation: Horticulturist, commercial: RETIRED    Comment: Retired  Tobacco Use  . Smoking status: Former Smoker    Packs/day: 0.25    Years: 3.00    Pack years: 0.75    Quit date: 06/06/1985    Years since quitting: 34.3  . Smokeless tobacco: Never Used  .  Tobacco comment: Approximately 10-pack-year history  Substance and Sexual Activity  . Alcohol use: No  . Drug use: No  . Sexual activity: Not Currently  Other Topics Concern  . Not on file  Social History Narrative   Lives in [place] with her son.   Has been widowed for about 3 years.   Social Determinants of Health   Financial Resource Strain: Low Risk   . Difficulty of Paying Living Expenses: Not very hard  Food Insecurity: No Food Insecurity  . Worried About Charity fundraiser in the Last Year: Never true  . Ran Out of Food in the Last Year: Never true  Transportation Needs: No Transportation Needs  . Lack of Transportation (Medical): No  . Lack of Transportation (Non-Medical): No  Physical Activity: Inactive  . Days of Exercise per Week: 0 days  . Minutes of Exercise per Session: 0 min  Stress: No Stress Concern Present  . Feeling of  Stress : Only a little  Social Connections: Slightly Isolated  . Frequency of Communication with Friends and Family: More than three times a week  . Frequency of Social Gatherings with Friends and Family: More than three times a week  . Attends Religious Services: More than 4 times per year  . Active Member of Clubs or Organizations: Yes  . Attends Archivist Meetings: More than 4 times per year  . Marital Status: Widowed  Intimate Partner Violence: Not At Risk  . Fear of Current or Ex-Partner: No  . Emotionally Abused: No  . Physically Abused: No  . Sexually Abused: No    Outpatient Medications Prior to Visit  Medication Sig Dispense Refill  . aspirin EC 81 MG tablet Take 81 mg by mouth daily.     Marland Kitchen atorvastatin (LIPITOR) 20 MG tablet Take 1 tablet (20 mg total) by mouth at bedtime. Needs to be seen before next refill 30 tablet 0  . docusate sodium (COLACE) 100 MG capsule Take 100 mg by mouth as needed.     . hydrocortisone (ANUSOL-HC) 25 MG suppository Place 1 suppository (25 mg total) rectally 2 (two) times daily. 12 suppository 0  . iron polysaccharides (NIFEREX) 150 MG capsule Take 150 mg by mouth daily.    Marland Kitchen loratadine (CLARITIN) 10 MG tablet Take 1 tablet (10 mg total) by mouth daily. 90 tablet 3  . mirabegron ER (MYRBETRIQ) 25 MG TB24 tablet Take 1 tablet (25 mg total) by mouth daily. 30 tablet 1  . Multiple Vitamins-Minerals (PRESERVISION AREDS 2) CAPS Take 1 capsule by mouth 2 (two) times a day. 180 capsule 2  . omeprazole (PRILOSEC) 20 MG capsule Take 1 capsule (20 mg total) by mouth 2 (two) times daily before a meal. 180 capsule 3  . telmisartan (MICARDIS) 80 MG tablet TAKE 1 TABLET EVERY DAY 90 tablet 1  . trimethoprim (TRIMPEX) 100 MG tablet Take 1 tablet (100 mg total) by mouth at bedtime. 90 tablet 3  . verapamil (CALAN-SR) 180 MG CR tablet TAKE 1 TABLET AT BEDTIME (REPLACES 120MG ).  Needs to be seen for future refills. 30 tablet 0   No facility-administered  medications prior to visit.    Allergies  Allergen Reactions  . Livalo [Pitavastatin] Other (See Comments)    Causes dizziness  . Simvastatin Other (See Comments)    Causes dizziness  . Lisinopril     Hallucinations, resolved on ARB    Review of Systems  Constitutional: Negative for activity change.  Respiratory: Negative for chest tightness  and shortness of breath.   Cardiovascular: Negative for chest pain, palpitations and leg swelling.  Musculoskeletal: Negative for joint swelling.       Right leg pain  Skin: Negative for color change and rash.  Neurological: Negative for headaches.  Psychiatric/Behavioral: The patient is not nervous/anxious.        Objective:    Physical Exam Constitutional:      Appearance: Normal appearance.  HENT:     Head: Normocephalic.     Nose: Nose normal.  Eyes:     Conjunctiva/sclera: Conjunctivae normal.  Cardiovascular:     Rate and Rhythm: Normal rate and regular rhythm.     Pulses: Normal pulses.     Heart sounds: Normal heart sounds.  Pulmonary:     Effort: Pulmonary effort is normal.     Breath sounds: Normal breath sounds.  Abdominal:     General: Bowel sounds are normal.     Palpations: Abdomen is soft.  Musculoskeletal:        General: Tenderness present. Normal range of motion.     Cervical back: Neck supple.  Skin:    General: Skin is warm.     Findings: No rash.  Neurological:     Mental Status: She is alert and oriented to person, place, and time.  Psychiatric:        Mood and Affect: Mood normal.        Behavior: Behavior normal.     BP 132/68   Pulse 66   Temp 97.6 F (36.4 C)   Resp 20   Ht 5' (1.524 m)   Wt 139 lb (63 kg)   SpO2 97%   BMI 27.15 kg/m  Wt Readings from Last 3 Encounters:  10/08/19 139 lb (63 kg)  09/18/19 135 lb (61.2 kg)  09/05/19 139 lb 4 oz (63.2 kg)    Health Maintenance Due  Topic Date Due  . TETANUS/TDAP  Never done  . COVID-19 Vaccine (2 - Moderna 2-dose series)  09/17/2019    There are no preventive care reminders to display for this patient.   Lab Results  Component Value Date   TSH 3.330 07/18/2017   Lab Results  Component Value Date   WBC 7.8 08/30/2019   HGB 11.8 08/30/2019   HCT 35.8 08/30/2019   MCV 97.8 08/30/2019   PLT 169 08/30/2019   Lab Results  Component Value Date   NA 141 12/31/2018   K 3.9 12/31/2018   CO2 23 12/31/2018   GLUCOSE 97 12/31/2018   BUN 20 12/31/2018   CREATININE 1.14 (H) 12/31/2018   BILITOT 0.3 12/31/2018   ALKPHOS 75 12/31/2018   AST 12 12/31/2018   ALT 8 12/31/2018   PROT 5.9 (L) 12/31/2018   ALBUMIN 4.1 12/31/2018   CALCIUM 9.9 12/31/2018   Lab Results  Component Value Date   CHOL 102 11/06/2018   Lab Results  Component Value Date   HDL 41 11/06/2018   Lab Results  Component Value Date   LDLCALC 14 11/06/2018   Lab Results  Component Value Date   TRIG 234 (H) 11/06/2018   Lab Results  Component Value Date   CHOLHDL 2.5 11/06/2018   Lab Results  Component Value Date   HGBA1C 5.6 04/10/2013       Assessment & Plan:   Problem List Items Addressed This Visit      Other   Leg pain, posterior, right - Primary    Patients right posterior leg pain is not well  managed, mild swelling and insect sting like appearance with varicose vains on the popliteal fossa area. I  Provided education to patient to continue  compression socks for better circulation, and use anti inflammatory for pain. Patient also advised to apply neosporin on the area that appears to be an insect sting. Watch and follow up as needed             Ivy Lynn, NP

## 2019-10-08 NOTE — Assessment & Plan Note (Addendum)
Patients right posterior leg pain is not well managed, mild swelling and insect sting like appearance with varicose vains on the popliteal fossa area. I  Provided education to patient to continue  compression socks for better circulation, and use anti inflammatory for pain. Patient also advised to apply neosporin on the area that appears to be an insect sting. Watch and follow up as needed

## 2019-10-17 ENCOUNTER — Ambulatory Visit: Payer: Medicare HMO | Admitting: *Deleted

## 2019-10-17 DIAGNOSIS — R296 Repeated falls: Secondary | ICD-10-CM

## 2019-10-17 DIAGNOSIS — D509 Iron deficiency anemia, unspecified: Secondary | ICD-10-CM

## 2019-10-17 NOTE — Patient Instructions (Signed)
Visit Information  Goals Addressed            This Visit's Progress     Patient Stated   . "I would like to keep from getting constipated" (pt-stated)       Constipation r/t to iron supplements in a patient with iron deficiency anemia.  Current Barriers:  . Chronic Disease Management support and education needs related to constipation.  Nurse Case Manager Clinical Goal(s):  Marland Kitchen Over the next 30 days, patient will demonstrate improved self-management by experiencing less frequent episodes of constipation.  . Over the next 30 days, patient will utilize OTC treatments and lifestyle modifications to decrease constipation.   Interventions:  . Chart reviewed including office notes, lab results, imaging reports, procedure reports . Talked with patient by telephone . Discussed that patient continues to have intermittent constipation . Previously provided education on constipation r/t iron supplements . Discussed home treatments o Taking colace once daily . Advised that she can take colace up to 3 times a day. Encouraged her to increase to 2 and see what effect that has and to increase to 3 if needed. . Previously provided education provided on constipation prevention and treatment  Patient Self Care Activities:  . Performs ADL's independently . Performs IADL's independently  Please see past updates related to this goal by clicking on the "Past Updates" button in the selected goal         Other   . Fall Prevention       Current Barriers:  Marland Kitchen Knowledge Deficits related to fall precautions . Decreased adherence to prescribed treatment for fall prevention . Orthostatic hypotension  Nurse Case Manager Clinical Goal(s):  Marland Kitchen Over the next 30 days, patient will verbalize using fall risk reduction strategies discussed . Over the next 90 days, patient will not experience additional falls  Interventions:  . Provided verbal education re: Potential causes of falls and Fall prevention  strategies . Reviewed medications and discussed potential side effects of medications such as dizziness and frequent urination . Assessed for s/s of orthostatic hypotension . Assessed for falls since last encounter o Mechanical fall a few days ago while walking her dog. Street was unlevel due to cutout for Facilities manager, and she tripped and fell. Was unable to get up on her own, but a passing car stopped and driver assisted her to her feet and walked her home 1 block away o Tore skin on hands. Still has some soreness but no broken bones.  . Assessed patients knowledge of fall risk prevention secondary to previously provided education. . Encouraged patient to: Marland Kitchen De-clutter walkways . Change positions slowly . Wear secure fitting shoes at all times with ambulation . Utilize home lighting for dim lit areas . Have self and pet awareness at all times . Encouraged patient to reach out to PCP with any new or worsening symptoms associated with recent fall  Patient Self Care Activities:  . Able to perform most ADLs independently and has assistance from son  Please see past updates related to this goal by clicking on the "Past Updates" button in the selected goal         The patient verbalized understanding of instructions provided today and declined a print copy of patient instruction materials.   Follow-up Plan The care management team will reach out to the patient again over the next 45 days.   [name], BSN, RN-BC Embedded Chronic Care Manager [facility] / [place] [facility] Direct Dial: [phone]

## 2019-10-17 NOTE — Chronic Care Management (AMB) (Signed)
Chronic Care Management   Follow Up Note   10/17/2019 Name: Amanda [name] MRN: [id number] DOB: 07/28/1930  Referred by: [name], DO Reason for referral : Chronic Care Management (RN follow up)   Amanda [name] is a 86 y.o. year old female who is a primary care patient of [name], DO. The CCM team was consulted for assistance with chronic disease management and care coordination needs.    Review of patient status, including review of consultants reports, relevant laboratory and other test results, and collaboration with appropriate care team members and the patient's provider was performed as part of comprehensive patient evaluation and provision of chronic care management services.    SDOH (Social Determinants of Health) assessments performed: No See Care Plan activities for detailed interventions related to [facility])     Outpatient Encounter Medications as of 10/17/2019  Medication Sig  . aspirin EC 81 MG tablet Take 81 mg by mouth daily.   Marland Kitchen atorvastatin (LIPITOR) 20 MG tablet Take 1 tablet (20 mg total) by mouth at bedtime. Needs to be seen before next refill  . docusate sodium (COLACE) 100 MG capsule Take 100 mg by mouth as needed.   . hydrocortisone (ANUSOL-HC) 25 MG suppository Place 1 suppository (25 mg total) rectally 2 (two) times daily.  . iron polysaccharides (NIFEREX) 150 MG capsule Take 150 mg by mouth daily.  Marland Kitchen loratadine (CLARITIN) 10 MG tablet Take 1 tablet (10 mg total) by mouth daily.  . mirabegron ER (MYRBETRIQ) 25 MG TB24 tablet Take 1 tablet (25 mg total) by mouth daily.  . Multiple Vitamins-Minerals (PRESERVISION AREDS 2) CAPS Take 1 capsule by mouth 2 (two) times a day.  Marland Kitchen omeprazole (PRILOSEC) 20 MG capsule Take 1 capsule (20 mg total) by mouth 2 (two) times daily before a meal.  . telmisartan (MICARDIS) 80 MG tablet TAKE 1 TABLET EVERY DAY  . trimethoprim (TRIMPEX) 100 MG tablet Take 1 tablet (100 mg total) by mouth at bedtime.  . verapamil  (CALAN-SR) 180 MG CR tablet TAKE 1 TABLET AT BEDTIME (REPLACES 120MG ).  Needs to be seen for future refills.   No facility-administered encounter medications on file as of 10/17/2019.     RN Care Plan   . "I would like to keep from getting constipated" (pt-stated)       Constipation r/t to iron supplements in a patient with iron deficiency anemia.  Current Barriers:  . Chronic Disease Management support and education needs related to constipation.  Nurse Case Manager Clinical Goal(s):  Marland Kitchen Over the next 30 days, patient will demonstrate improved self-management by experiencing less frequent episodes of constipation.  . Over the next 30 days, patient will utilize OTC treatments and lifestyle modifications to decrease constipation.   Interventions:  . Chart reviewed including office notes, lab results, imaging reports, procedure reports . Talked with patient by telephone . Discussed that patient continues to have intermittent constipation . Previously provided education on constipation r/t iron supplements . Discussed home treatments o Taking colace once daily . Advised that she can take colace up to 3 times a day. Encouraged her to increase to 2 and see what effect that has and to increase to 3 if needed. . Previously provided education provided on constipation prevention and treatment  Patient Self Care Activities:  . Performs ADL's independently . Performs IADL's independently  Please see past updates related to this goal by clicking on the "Past Updates" button in the selected goal       .  Fall Prevention       Current Barriers:  Marland Kitchen Knowledge Deficits related to fall precautions . Decreased adherence to prescribed treatment for fall prevention . Orthostatic hypotension  Nurse Case Manager Clinical Goal(s):  Marland Kitchen Over the next 30 days, patient will verbalize using fall risk reduction strategies discussed . Over the next 90 days, patient will not experience additional  falls  Interventions:  . Provided verbal education re: Potential causes of falls and Fall prevention strategies . Reviewed medications and discussed potential side effects of medications such as dizziness and frequent urination . Assessed for s/s of orthostatic hypotension . Assessed for falls since last encounter o Mechanical fall a few days ago while walking her dog. Street was unlevel due to cutout for Facilities manager, and she tripped and fell. Was unable to get up on her own, but a passing car stopped and driver assisted her to her feet and walked her home 1 block away o Tore skin on hands. Still has some soreness but no broken bones.  . Assessed patients knowledge of fall risk prevention secondary to previously provided education. . Encouraged patient to: Marland Kitchen De-clutter walkways . Change positions slowly . Wear secure fitting shoes at all times with ambulation . Utilize home lighting for dim lit areas . Have self and pet awareness at all times . Encouraged patient to reach out to PCP with any new or worsening symptoms associated with recent fall  Patient Self Care Activities:  . Able to perform most ADLs independently and has assistance from son  Please see past updates related to this goal by clicking on the "Past Updates" button in the selected goal          Plan:   The care management team will reach out to the patient again over the next 45 days.    [name], BSN, RN-BC Embedded Chronic Care Manager [facility] / [place] [facility] Direct Dial: [phone]

## 2019-11-03 ENCOUNTER — Other Ambulatory Visit: Payer: Self-pay | Admitting: Urology

## 2019-11-06 ENCOUNTER — Telehealth: Payer: Self-pay | Admitting: *Deleted

## 2019-11-06 MED ORDER — TELMISARTAN 80 MG PO TABS: 80.0000 mg | ORAL_TABLET | Freq: Every day | ORAL | 3 refills | Status: DC

## 2019-11-06 NOTE — Telephone Encounter (Signed)
done

## 2019-11-06 NOTE — Telephone Encounter (Signed)
Pharmacy says patient is requesting refill of micardis  Please review and fill if appropriate.

## 2019-11-07 NOTE — Telephone Encounter (Signed)
Patient aware.

## 2019-11-20 ENCOUNTER — Ambulatory Visit (INDEPENDENT_AMBULATORY_CARE_PROVIDER_SITE_OTHER): Payer: Medicare HMO | Admitting: *Deleted

## 2019-11-20 DIAGNOSIS — N183 Chronic kidney disease, stage 3 unspecified: Secondary | ICD-10-CM | POA: Diagnosis not present

## 2019-11-20 DIAGNOSIS — I10 Essential (primary) hypertension: Secondary | ICD-10-CM

## 2019-11-20 DIAGNOSIS — R296 Repeated falls: Secondary | ICD-10-CM

## 2019-11-20 DIAGNOSIS — E782 Mixed hyperlipidemia: Secondary | ICD-10-CM | POA: Diagnosis not present

## 2019-11-20 DIAGNOSIS — D508 Other iron deficiency anemias: Secondary | ICD-10-CM

## 2019-11-21 NOTE — Chronic Care Management (AMB) (Addendum)
Chronic Care Management   Follow Up Note   11/20/2019 Name: Amanda [name] MRN: [id number] DOB: Oct 18, 1930  Referred by: [name], DO Reason for referral : Chronic Care Management   Amanda [name] is a 86 y.o. year old female who is a primary care patient of [name], DO. The CCM team was consulted for assistance with chronic disease management and care coordination needs.    Review of patient status, including review of consultants reports, relevant laboratory and other test results, and collaboration with appropriate care team members and the patient's provider was performed as part of comprehensive patient evaluation and provision of chronic care management services.    SDOH (Social Determinants of Health) assessments performed: No See Care Plan activities for detailed interventions related to [facility])     Outpatient Encounter Medications as of 11/20/2019  Medication Sig  . aspirin EC 81 MG tablet Take 81 mg by mouth daily.   Marland Kitchen atorvastatin (LIPITOR) 20 MG tablet Take 1 tablet (20 mg total) by mouth at bedtime. Needs to be seen before next refill  . docusate sodium (COLACE) 100 MG capsule Take 100 mg by mouth as needed.   . hydrocortisone (ANUSOL-HC) 25 MG suppository Place 1 suppository (25 mg total) rectally 2 (two) times daily.  . iron polysaccharides (NIFEREX) 150 MG capsule Take 150 mg by mouth daily.  Marland Kitchen loratadine (CLARITIN) 10 MG tablet Take 1 tablet (10 mg total) by mouth daily.  . Multiple Vitamins-Minerals (PRESERVISION AREDS 2) CAPS Take 1 capsule by mouth 2 (two) times a day.  Marland Kitchen MYRBETRIQ 25 MG TB24 tablet TAKE 1 TABLET EVERY DAY  . omeprazole (PRILOSEC) 20 MG capsule Take 1 capsule (20 mg total) by mouth 2 (two) times daily before a meal.  . telmisartan (MICARDIS) 80 MG tablet Take 1 tablet (80 mg total) by mouth daily.  Marland Kitchen trimethoprim (TRIMPEX) 100 MG tablet Take 1 tablet (100 mg total) by mouth at bedtime.  . verapamil (CALAN-SR) 180 MG CR tablet TAKE 1  TABLET AT BEDTIME (REPLACES 120MG ).  Needs to be seen for future refills.   No facility-administered encounter medications on file as of 11/20/2019.     RN Care Plan   .  "I need some help paying for my medications" (pt-stated)        [place] (see longitudinal plan of care for additional care plan information)  Current Barriers:  . Care Coordination needs related to prescription assistance in a patient with HTN, overactive bladder, CAD (disease states) . Film/video editor.  . Difficulty obtaining medications  Nurse Case Manager Clinical Goal(s):  Marland Kitchen Over the next 30 days, patient will work with Consulting civil engineer to address needs related to prescription assistance  Interventions:  . Inter-disciplinary care team collaboration (see longitudinal plan of care) . Chart reviewed including recent office notes and lab results . Reviewed and discussed medications . Discussed cost of medications: Myrbetriq is too expensive o Recommended she check with urologist about samples . Will mail information on Medicare Extra Help . Scheduled appt for 12/13/19 to assist with Medicare Extra Help application  Patient Self Care Activities:  . Performs ADL's independently . Performs IADL's independently  Initial goal documentation     .  "I would like to keep from getting constipated" (pt-stated)        Constipation r/t to iron supplements in a patient with iron deficiency anemia.  Current Barriers:  . Chronic Disease Management support and education needs related to constipation.  Nurse Case Manager Clinical Goal(s):  Marland Kitchen Over the next 30 days, patient will demonstrate improved self-management by experiencing less frequent episodes of constipation.  . Over the next 30 days, patient will utilize OTC treatments and lifestyle modifications to decrease constipation.   Interventions:  . Chart reviewed including office notes, lab results, imaging reports, procedure reports . Talked with patient  by telephone . Discussed that patient continues to have intermittent constipation . Previously provided education on constipation r/t iron supplements . Reviewed medications . Discussed home treatments o Taking colace once daily . Reminded that she can take colace up to 3 times a day. Encouraged her to increase to 2 and see what effect that has and to increase to 3 if needed. . Previously provided education provided on constipation prevention and treatment  Patient Self Care Activities:  . Performs ADL's independently . Performs IADL's independently  Please see past updates related to this goal by clicking on the "Past Updates" button in the selected goal       .  Fall Prevention        Current Barriers:  Marland Kitchen Knowledge Deficits related to fall precautions . Decreased adherence to prescribed treatment for fall prevention . Orthostatic hypotension  Nurse Case Manager Clinical Goal(s):  Marland Kitchen Over the next 30 days, patient will verbalize using fall risk reduction strategies discussed . Over the next 90 days, patient will not experience additional falls  Interventions:  . Provided verbal education re: Potential causes of falls and Fall prevention strategies . Reviewed medications and discussed potential side effects of medications such as dizziness and frequent urination . Assessed for s/s of orthostatic hypotension . Assessed for falls since last encounter . Assessed patients knowledge of fall risk prevention secondary to previously provided education. . Encouraged patient to: Marland Kitchen Use cane for ambulation when feeling unsteady . De-clutter walkways . Change positions slowly . Wear secure fitting shoes at all times with ambulation . Utilize home lighting for dim lit areas . Have self and pet awareness at all times  Patient Self Care Activities:  . Able to perform most ADLs independently and has assistance from son  Please see past updates related to this goal by clicking on the "Past  Updates" button in the selected goal          Plan:   Telephone follow up appointment with care management team member scheduled for:12/13/19   [name], BSN, RN-BC [place] / [place] Management Direct Dial: [phone]

## 2019-11-21 NOTE — Patient Instructions (Signed)
Visit Information  Goals Addressed              This Visit's Progress     Patient Stated   .  "I need some help paying for my medications" (pt-stated)        [place] (see longitudinal plan of care for additional care plan information)  Current Barriers:  . Care Coordination needs related to prescription assistance in a patient with HTN, overactive bladder, CAD (disease states) . Film/video editor.  . Difficulty obtaining medications  Nurse Case Manager Clinical Goal(s):  Marland Kitchen Over the next 30 days, patient will work with Consulting civil engineer to address needs related to prescription assistance  Interventions:  . Inter-disciplinary care team collaboration (see longitudinal plan of care) . Chart reviewed including recent office notes and lab results . Reviewed and discussed medications . Discussed cost of medications: Myrbetriq is too expensive o Recommended she check with urologist about samples . Will mail information on Medicare Extra Help . Scheduled appt for 12/13/19 to assist with Medicare Extra Help application  Patient Self Care Activities:  . Performs ADL's independently . Performs IADL's independently  Initial goal documentation     .  "I would like to keep from getting constipated" (pt-stated)        Constipation r/t to iron supplements in a patient with iron deficiency anemia.  Current Barriers:  . Chronic Disease Management support and education needs related to constipation.  Nurse Case Manager Clinical Goal(s):  Marland Kitchen Over the next 30 days, patient will demonstrate improved self-management by experiencing less frequent episodes of constipation.  . Over the next 30 days, patient will utilize OTC treatments and lifestyle modifications to decrease constipation.   Interventions:  . Chart reviewed including office notes, lab results, imaging reports, procedure reports . Talked with patient by telephone . Discussed that patient continues to have intermittent  constipation . Previously provided education on constipation r/t iron supplements . Reviewed medications . Discussed home treatments o Taking colace once daily . Reminded that she can take colace up to 3 times a day. Encouraged her to increase to 2 and see what effect that has and to increase to 3 if needed. . Previously provided education provided on constipation prevention and treatment  Patient Self Care Activities:  . Performs ADL's independently . Performs IADL's independently  Please see past updates related to this goal by clicking on the "Past Updates" button in the selected goal         Other   .  Fall Prevention        Current Barriers:  Marland Kitchen Knowledge Deficits related to fall precautions . Decreased adherence to prescribed treatment for fall prevention . Orthostatic hypotension  Nurse Case Manager Clinical Goal(s):  Marland Kitchen Over the next 30 days, patient will verbalize using fall risk reduction strategies discussed . Over the next 90 days, patient will not experience additional falls  Interventions:  . Provided verbal education re: Potential causes of falls and Fall prevention strategies . Reviewed medications and discussed potential side effects of medications such as dizziness and frequent urination . Assessed for s/s of orthostatic hypotension . Assessed for falls since last encounter . Assessed patients knowledge of fall risk prevention secondary to previously provided education. . Encouraged patient to: Marland Kitchen Use cane for ambulation when feeling unsteady . De-clutter walkways . Change positions slowly . Wear secure fitting shoes at all times with ambulation . Utilize home lighting for dim lit areas . Have self and pet  awareness at all times  Patient Self Care Activities:  . Able to perform most ADLs independently and has assistance from son  Please see past updates related to this goal by clicking on the "Past Updates" button in the selected goal         The  patient verbalized understanding of instructions provided today and declined a print copy of patient instruction materials.   Follow-up Plan Telephone follow up appointment with care management team member scheduled for:12/13/19  [name], BSN, RN-BC [place] / [place] Management Direct Dial: [phone]

## 2019-11-28 ENCOUNTER — Ambulatory Visit: Payer: Medicare HMO | Admitting: *Deleted

## 2019-11-28 DIAGNOSIS — I10 Essential (primary) hypertension: Secondary | ICD-10-CM

## 2019-11-28 DIAGNOSIS — E782 Mixed hyperlipidemia: Secondary | ICD-10-CM

## 2019-11-28 DIAGNOSIS — N183 Chronic kidney disease, stage 3 unspecified: Secondary | ICD-10-CM

## 2019-11-28 DIAGNOSIS — D508 Other iron deficiency anemias: Secondary | ICD-10-CM | POA: Diagnosis not present

## 2019-11-28 NOTE — Chronic Care Management (AMB) (Signed)
°  Chronic Care Management   Follow Up Note   11/28/2019 Name: Amanda [name] MRN: [id number] DOB: April 05, 1931  Referred by: [name], DO Reason for referral : Chronic Care Management (Care Coordination)   Amanda [name] is a 86 y.o. year old female who is a primary care patient of [name], DO. The CCM team was consulted for assistance with chronic disease management and care coordination needs.    Review of patient status, including review of consultants reports, relevant laboratory and other test results, and collaboration with appropriate care team members and the patient's provider was performed as part of comprehensive patient evaluation and provision of chronic care management services.      RN Care Plan     "I need some help paying for my medications" (pt-stated)        CARE PLAN ENTRY (see longitudinal plan of care for additional care plan information)  Current Barriers:   Care Coordination needs related to prescription assistance in a patient with HTN, overactive bladder, CAD (disease states)  Film/video editor.   Difficulty obtaining medications  Nurse Case Manager Clinical Goal(s):   Over the next 30 days, patient will work with RN Care Manager to address needs related to prescription assistance  Interventions:   Inter-disciplinary care team collaboration (see longitudinal plan of care)  Previous Chart review including recent office notes and lab results  Previously reviewed and discussed medications  Materials prepared on Medicare Extra Help and letter mailed to patient  Scheduled appt for 12/13/19 to assist with Medicare Extra Help application online  Patient Self Care Activities:   Performs ADL's independently  Performs IADL's independently  Please see past updates related to this goal by clicking on the "Past Updates" button in the selected goal          Plan:   Telephone follow up appointment with care management team member  scheduled for:12/13/19  [name], BSN, RN-BC [place] / [place] Management Direct Dial: [phone]

## 2019-11-28 NOTE — Patient Instructions (Signed)
Visit Information  Goals Addressed              This Visit's Progress     Patient Stated   .  "I need some help paying for my medications" (pt-stated)        [place] (see longitudinal plan of care for additional care plan information)  Current Barriers:  . Care Coordination needs related to prescription assistance in a patient with HTN, overactive bladder, CAD (disease states) . Film/video editor.  . Difficulty obtaining medications  Nurse Case Manager Clinical Goal(s):  Marland Kitchen Over the next 30 days, patient will work with Consulting civil engineer to address needs related to prescription assistance  Interventions:  . Inter-disciplinary care team collaboration (see longitudinal plan of care) . Previous Chart review including recent office notes and lab results . Previously reviewed and discussed medications . Materials prepared on Medicare Extra Help and letter mailed to patient . Scheduled appt for 12/13/19 to assist with Medicare Extra Help application online  Patient Self Care Activities:  . Performs ADL's independently . Performs IADL's independently  Please see past updates related to this goal by clicking on the "Past Updates" button in the selected goal         Print copy of patient instructions provided.   Follow-up Plan Telephone follow up appointment with care management team member scheduled for:12/13/19  [name], BSN, RN-BC [place] / [place] Management Direct Dial: [phone]

## 2019-12-05 ENCOUNTER — Ambulatory Visit: Payer: Medicare HMO | Admitting: Gastroenterology

## 2019-12-12 ENCOUNTER — Other Ambulatory Visit: Payer: Self-pay | Admitting: *Deleted

## 2019-12-12 NOTE — Telephone Encounter (Signed)
[place]. NTBS Last lipid 11/06/18. Last chronic ckup 02/06/19. Mail order not sent

## 2019-12-13 ENCOUNTER — Ambulatory Visit: Payer: Medicare HMO | Admitting: *Deleted

## 2019-12-13 DIAGNOSIS — E782 Mixed hyperlipidemia: Secondary | ICD-10-CM

## 2019-12-13 DIAGNOSIS — I10 Essential (primary) hypertension: Secondary | ICD-10-CM

## 2019-12-13 MED ORDER — ATORVASTATIN CALCIUM 20 MG PO TABS: 20.0000 mg | ORAL_TABLET | Freq: Every day | ORAL | 0 refills | Status: DC

## 2019-12-13 NOTE — Addendum Note (Signed)
Addended by: Karle Plumber on: 12/13/2019 04:35 PM   Modules accepted: Orders

## 2019-12-13 NOTE — Patient Instructions (Signed)
Visit Information  Goals Addressed              This Visit's Progress     Patient Stated   .  "I need some help paying for my medications" (pt-stated)        [place] (see longitudinal plan of care for additional care plan information)  Current Barriers:  . Care Coordination needs related to prescription assistance in a patient with HTN, overactive bladder, CAD (disease states) . Film/video editor.  . Difficulty obtaining medications  Nurse Case Manager Clinical Goal(s):  Marland Kitchen Over the next 30 days, patient will work with Consulting civil engineer to address needs related to prescription assistance  Interventions:  . Inter-disciplinary care team collaboration (see longitudinal plan of care) . Talked with patient by telephone regarding information on Medicare Extra Help that I mailed her . She would like assistance completing an online application but she prefers to do that in person . In-person visit scheduled for [facility] 12/16/19 . Patient advised to bring financial documents  Patient Self Care Activities:  . Performs ADL's independently . Performs IADL's independently  Please see past updates related to this goal by clicking on the "Past Updates" button in the selected goal         Patient verbalizes understanding of instructions provided today.   Follow-up Plan Face to Face appointment with care management team member scheduled for: 12/16/19  [name], BSN, RN-BC [place] / [place] Management Direct Dial: [phone]

## 2019-12-13 NOTE — Telephone Encounter (Signed)
Appointment scheduled and one month supply given until appointment.

## 2019-12-13 NOTE — Chronic Care Management (AMB) (Signed)
Chronic Care Management   Follow Up Note   12/13/2019 Name: Amanda [name] MRN: [id number] DOB: 05-Jan-1931  Referred by: [name], DO Reason for referral : Chronic Care Management (RN Follow-up)   Amanda [name] is a 86 y.o. year old female who is a primary care patient of [name], DO. The CCM team was consulted for assistance with chronic disease management and care coordination needs.    Review of patient status, including review of consultants reports, relevant laboratory and other test results, and collaboration with appropriate care team members and the patient's provider was performed as part of comprehensive patient evaluation and provision of chronic care management services.    SDOH (Social Determinants of Health) assessments performed: Yes-Medication cost See Care Plan activities for detailed interventions related to [facility])     Outpatient Encounter Medications as of 12/13/2019  Medication Sig  . aspirin EC 81 MG tablet Take 81 mg by mouth daily.   Marland Kitchen atorvastatin (LIPITOR) 20 MG tablet Take 1 tablet (20 mg total) by mouth at bedtime. Needs to be seen before next refill  . docusate sodium (COLACE) 100 MG capsule Take 100 mg by mouth as needed.   . hydrocortisone (ANUSOL-HC) 25 MG suppository Place 1 suppository (25 mg total) rectally 2 (two) times daily.  . iron polysaccharides (NIFEREX) 150 MG capsule Take 150 mg by mouth daily.  Marland Kitchen loratadine (CLARITIN) 10 MG tablet Take 1 tablet (10 mg total) by mouth daily.  . Multiple Vitamins-Minerals (PRESERVISION AREDS 2) CAPS Take 1 capsule by mouth 2 (two) times a day.  Marland Kitchen MYRBETRIQ 25 MG TB24 tablet TAKE 1 TABLET EVERY DAY  . omeprazole (PRILOSEC) 20 MG capsule Take 1 capsule (20 mg total) by mouth 2 (two) times daily before a meal.  . telmisartan (MICARDIS) 80 MG tablet Take 1 tablet (80 mg total) by mouth daily.  Marland Kitchen trimethoprim (TRIMPEX) 100 MG tablet Take 1 tablet (100 mg total) by mouth at bedtime.  . verapamil  (CALAN-SR) 180 MG CR tablet TAKE 1 TABLET AT BEDTIME (REPLACES 120MG ).  Needs to be seen for future refills.   No facility-administered encounter medications on file as of 12/13/2019.      Civil engineer, contracting   .  "I need some help paying for my medications" (pt-stated)        [place] (see longitudinal plan of care for additional care plan information)  Current Barriers:  . Care Coordination needs related to prescription assistance in a patient with HTN, overactive bladder, CAD (disease states) . Film/video editor.  . Difficulty obtaining medications  Nurse Case Manager Clinical Goal(s):  Marland Kitchen Over the next 30 days, patient will work with Consulting civil engineer to address needs related to prescription assistance  Interventions:  . Inter-disciplinary care team collaboration (see longitudinal plan of care) . Talked with patient by telephone regarding information on Medicare Extra Help that I mailed her . She would like assistance completing an online application but she prefers to do that in person . In-person visit scheduled for [facility] 12/16/19 . Patient advised to bring financial documents  Patient Self Care Activities:  . Performs ADL's independently . Performs IADL's independently  Please see past updates related to this goal by clicking on the "Past Updates" button in the selected goal          Plan:   The care management team will reach out to the patient again over the next 7 days.   [name], BSN, RN-BC  [place] / [facility] Care Management Direct Dial: [phone]

## 2019-12-16 ENCOUNTER — Ambulatory Visit (INDEPENDENT_AMBULATORY_CARE_PROVIDER_SITE_OTHER): Payer: Medicare HMO | Admitting: *Deleted

## 2019-12-16 ENCOUNTER — Other Ambulatory Visit: Payer: Self-pay

## 2019-12-16 DIAGNOSIS — N3021 Other chronic cystitis with hematuria: Secondary | ICD-10-CM

## 2019-12-16 DIAGNOSIS — N183 Chronic kidney disease, stage 3 unspecified: Secondary | ICD-10-CM | POA: Diagnosis not present

## 2019-12-16 DIAGNOSIS — I10 Essential (primary) hypertension: Secondary | ICD-10-CM

## 2019-12-16 DIAGNOSIS — D631 Anemia in chronic kidney disease: Secondary | ICD-10-CM | POA: Diagnosis not present

## 2019-12-16 DIAGNOSIS — Z79899 Other long term (current) drug therapy: Secondary | ICD-10-CM | POA: Diagnosis not present

## 2019-12-16 DIAGNOSIS — N182 Chronic kidney disease, stage 2 (mild): Secondary | ICD-10-CM | POA: Diagnosis not present

## 2019-12-16 DIAGNOSIS — E559 Vitamin D deficiency, unspecified: Secondary | ICD-10-CM | POA: Diagnosis not present

## 2019-12-16 DIAGNOSIS — R809 Proteinuria, unspecified: Secondary | ICD-10-CM | POA: Diagnosis not present

## 2019-12-16 DIAGNOSIS — E782 Mixed hyperlipidemia: Secondary | ICD-10-CM

## 2019-12-16 NOTE — Patient Instructions (Signed)
Visit Information  Goals Addressed              This Visit's Progress     Patient Stated   .  "I need some help paying for my medications" (pt-stated)        [place] (see longitudinal plan of care for additional care plan information)  Current Barriers:  . Care Coordination needs related to prescription assistance in a patient with HTN, overactive bladder, CAD (disease states) . Film/video editor.  . Difficulty obtaining medications  Nurse Case Manager Clinical Goal(s):  Marland Kitchen Over the next 30 days, patient will work with Consulting civil engineer to address needs related to prescription assistance  Interventions:  . Inter-disciplinary care team collaboration (see longitudinal plan of care) . Reviewed Medicare Extra Help program with patient . Assisted with online application . Reviewed and discussed medications . Advised patient of qualification guidelines . Advised that she should get an approval or denial letter within the next 30 days  Patient Self Care Activities:  . Performs ADL's independently . Performs IADL's independently  Please see past updates related to this goal by clicking on the "Past Updates" button in the selected goal         Patient verbalizes understanding of instructions provided today.   Follow-up Plan The care management team will reach out to the patient again over the next 45 days.   Amanda [name], BSN, RN-BC Embedded Chronic Care Manager [facility] / [place] Management Direct Dial: [phone]

## 2019-12-16 NOTE — Chronic Care Management (AMB) (Signed)
Chronic Care Management   Follow Up Note   12/16/2019 Name: Amanda [name] MRN: [id number] DOB: Jun 26, 1930  Referred by: [name], DO Reason for referral : Chronic Care Management (RN assistance with Medicare Extra Help Application)   Amanda [name] is a 86 y.o. year old female who is a primary care patient of [name], DO. The CCM team was consulted for assistance with chronic disease management and care coordination needs.    Review of patient status, including review of consultants reports, relevant laboratory and other test results, and collaboration with appropriate care team members and the patient's provider was performed as part of comprehensive patient evaluation and provision of chronic care management services.    Face-to-face visit with patient to review Medicare Extra Help Program and assist with online application.    SDOH (Social Determinants of Health) assessments performed: Yes-Medication Costs See Care Plan activities for detailed interventions related to [facility])     Outpatient Encounter Medications as of 12/16/2019  Medication Sig  . aspirin EC 81 MG tablet Take 81 mg by mouth daily.   Marland Kitchen atorvastatin (LIPITOR) 20 MG tablet Take 1 tablet (20 mg total) by mouth at bedtime. Needs to be seen before next refill  . docusate sodium (COLACE) 100 MG capsule Take 100 mg by mouth as needed.   . hydrocortisone (ANUSOL-HC) 25 MG suppository Place 1 suppository (25 mg total) rectally 2 (two) times daily.  . iron polysaccharides (NIFEREX) 150 MG capsule Take 150 mg by mouth daily.  Marland Kitchen loratadine (CLARITIN) 10 MG tablet Take 1 tablet (10 mg total) by mouth daily.  . Multiple Vitamins-Minerals (PRESERVISION AREDS 2) CAPS Take 1 capsule by mouth 2 (two) times a day.  Marland Kitchen MYRBETRIQ 25 MG TB24 tablet TAKE 1 TABLET EVERY DAY  . omeprazole (PRILOSEC) 20 MG capsule Take 1 capsule (20 mg total) by mouth 2 (two) times daily before a meal.  . telmisartan (MICARDIS) 80 MG tablet Take  1 tablet (80 mg total) by mouth daily.  Marland Kitchen trimethoprim (TRIMPEX) 100 MG tablet Take 1 tablet (100 mg total) by mouth at bedtime.  . verapamil (CALAN-SR) 180 MG CR tablet TAKE 1 TABLET AT BEDTIME (REPLACES 120MG ).  Needs to be seen for future refills.   No facility-administered encounter medications on file as of 12/16/2019.     RN Care Plan     Patient Stated   .  "I need some help paying for my medications" (pt-stated)        [place] (see longitudinal plan of care for additional care plan information)  Current Barriers:  . Care Coordination needs related to prescription assistance in a patient with HTN, overactive bladder, CAD (disease states) . Film/video editor.  . Difficulty obtaining medications  Nurse Case Manager Clinical Goal(s):  Marland Kitchen Over the next 30 days, patient will work with Consulting civil engineer to address needs related to prescription assistance  Interventions:  . Inter-disciplinary care team collaboration (see longitudinal plan of care) . Reviewed Medicare Extra Help program with patient . Assisted with online application . Reviewed and discussed medications . Advised patient of qualification guidelines . Advised that she should get an approval or denial letter within the next 30 days  Patient Self Care Activities:  . Performs ADL's independently . Performs IADL's independently  Please see past updates related to this goal by clicking on the "Past Updates" button in the selected goal          Plan:  The care  management team will reach out to the patient again over the next 45 days.   [name], BSN, RN-BC Embedded Chronic Care Manager [facility] / [place] Management Direct Dial: [phone]

## 2019-12-17 ENCOUNTER — Ambulatory Visit: Payer: Medicare HMO | Admitting: Nurse Practitioner

## 2019-12-17 ENCOUNTER — Encounter: Payer: Self-pay | Admitting: Nurse Practitioner

## 2019-12-17 VITALS — BP 149/76 | HR 74 | Temp 97.7°F | Ht 60.0 in | Wt 139.6 lb

## 2019-12-17 DIAGNOSIS — K921 Melena: Secondary | ICD-10-CM | POA: Diagnosis not present

## 2019-12-17 DIAGNOSIS — K219 Gastro-esophageal reflux disease without esophagitis: Secondary | ICD-10-CM | POA: Diagnosis not present

## 2019-12-17 NOTE — Assessment & Plan Note (Signed)
GERD symptoms doing well on Prilosec twice daily.  Once daily dosing was not effective.  Recommend she continue her current medications and follow-up in 6 months.

## 2019-12-17 NOTE — Assessment & Plan Note (Signed)
Previously noted hematochezia with stable hemoglobin on follow-up.  Has not had any more rectal bleeding since her last visit.  Recommend she continue to monitor and notify us of any further rectal bleeding.

## 2019-12-17 NOTE — Progress Notes (Signed)
Cc'ed to pcp °

## 2019-12-17 NOTE — Progress Notes (Signed)
Referring Provider: [name], DO Primary Care Physician:  [name], DO Primary GI:  Dr. Gala Romney (in the absence of Dr. Oneida Alar); pending Dr. Abbey Chatters  Chief Complaint  Patient presents with  . Gastroesophageal Reflux    occ    HPI:   Amanda [name] is a 86 y.o. female who presents for follow-up.  The patient was last seen in our office 08/30/2019 for rectal bleeding.  Noted history of GERD, constipation, rectal bleeding.  EGD and colonoscopy in November 2020 due to symptoms with likely source of hematochezia as a diverticular related internal hemorrhoids, EGD without obvious source.  She reported recurrent large-volume bleeding.  Previous hemoglobin 12.9 in January 2021.  At her last visit she noted some increasing falls recently, no dizziness.  Noticed bleeding Friday through Tuesday the week prior which abruptly stopped on Tuesday.  Stools are black and red, clots noted.  When the bleeding started she was having 3-4 stools a day, no abdominal pain, rectal burning, itching.  Started off as a large amount progressively less.  Stools have been black on iron, occasional constipation with this.  Uses Anusol and stool softener.  Recommended strict avoidance of NSAIDs, continue PPI twice daily, CBC and iron studies, follow-up with primary care related to falls, consider capsule endoscopy depending on lab results, follow-up in 3 to 4 months, to ED for recurrent large-volume bleeding.    CBC completed 08/30/2019 found some decline in hemoglobin from 12.9 three months prior to 11.8.  Otherwise, unremarkable.  Iron studies found normal iron, sats, ferritin.  Again recommended follow-up with primary care related to falls.  Today she states she is doing well overall. GERD doing well, no recent problems if she takes her PPI. Once daily dosing wasn't effective, bid doing is much better. She is mostly avoiding triggers. Denies abdominal pain, N/V, hematochezia, melena, fever, chills,  unintentional weight loss. Denies URI or flu-like symptoms. Denies loss of sense of taste or smell. The patient has received COVID-19 vaccination(s). Denies chest pain, dyspnea, dizziness, lightheadedness, syncope, near syncope. Denies any other upper or lower GI symptoms.  Denies recent falls, still feels a bit stumbly. Is paying more attention and taking care to not fall/taking precautions. Has not had any more bleeding sicne her last visit.  Past Medical History:  Diagnosis Date  . Allergy   . Anxiety   . Coronary artery disease    LAD 30% followed by 60% stenosis; pressure wire measurement demonstrated no significant gradient; circumflex had 30% stenosis, right coronary artery has 40% stenosed; EF 75-80%  . Diverticulosis   . GERD (gastroesophageal reflux disease)   . Hematuria   . Hyperlipidemia   . Hypertension   . Impaired renal function 04/23/2018  . Stroke [facility]) 2003   With a left MCA occlusion  . TIA (transient ischemic attack)     Past Surgical History:  Procedure Laterality Date  . ABDOMINAL HYSTERECTOMY     partial and complete - x  surgeries  . APPENDECTOMY    . CHOLECYSTECTOMY    . COLONOSCOPY N/A 04/19/2019   TI normal, four sessile polyps, 2-6 mm in size, multiple small and large-mouthed diverticula in entire colon, external and internal hemorrhoids, (tubular adenoma, sessile serrated polyp without dysplasia)  . ESOPHAGOGASTRODUODENOSCOPY N/A 04/19/2019   mild schatzki ring s/p dilation, multiple gastric polyps (fundic), non-bleeding duodenal diverticulum, benign small bowel, no villous, no dysplasia)  . EYE SURGERY Bilateral    cataracts  . Hysterectomy-type unspecified    .  POLYPECTOMY  04/19/2019   Procedure: POLYPECTOMY;  Surgeon: Danie Binder, MD;  Location: [facility] SUITE;  Service: Endoscopy;;  colon   . TONSILLECTOMY      Current Outpatient Medications  Medication Sig Dispense Refill  . aspirin EC 81 MG tablet Take 81 mg by mouth daily.     Marland Kitchen  atorvastatin (LIPITOR) 20 MG tablet Take 1 tablet (20 mg total) by mouth at bedtime. Needs to be seen before next refill 30 tablet 0  . iron polysaccharides (NIFEREX) 150 MG capsule Take 150 mg by mouth daily.    Marland Kitchen loratadine (CLARITIN) 10 MG tablet Take 1 tablet (10 mg total) by mouth daily. 90 tablet 3  . Multiple Vitamins-Minerals (PRESERVISION AREDS 2) CAPS Take 1 capsule by mouth 2 (two) times a day. 180 capsule 2  . MYRBETRIQ 25 MG TB24 tablet TAKE 1 TABLET EVERY DAY 60 tablet 11  . omeprazole (PRILOSEC) 20 MG capsule Take 1 capsule (20 mg total) by mouth 2 (two) times daily before a meal. 180 capsule 3  . telmisartan (MICARDIS) 80 MG tablet Take 1 tablet (80 mg total) by mouth daily. 90 tablet 3  . trimethoprim (TRIMPEX) 100 MG tablet Take 1 tablet (100 mg total) by mouth at bedtime. 90 tablet 3  . verapamil (CALAN-SR) 180 MG CR tablet TAKE 1 TABLET AT BEDTIME (REPLACES 120MG ).  Needs to be seen for future refills. 30 tablet 0   No current facility-administered medications for this visit.    Allergies as of 12/17/2019 - Review Complete 12/17/2019  Allergen Reaction Noted  . Livalo [pitavastatin] Other (See Comments) 05/19/2012  . Simvastatin Other (See Comments) 05/19/2012  . Lisinopril  09/14/2017    Family History  Problem Relation Age of Onset  . Colon cancer Mother 45       dx on colonoscopy   . Cancer Mother        colon  . Cancer Father        unsure if cancer  --- tumor on brain  . Heart attack Sister   . Heart attack Brother   . Diabetes Brother   . GI problems Son   . COPD Sister   . Hypertension Brother   . Heart disease Brother   . Heart attack Brother   . Heart disease Brother   . Alcohol abuse Brother     Social History   Socioeconomic History  . Marital status: Widowed    Spouse name: Not on file  . Number of children: 2  . Years of education: 51  . Highest education level: High school graduate  Occupational History  . Occupation: Metallurgist: RETIRED    Comment: Retired  Tobacco Use  . Smoking status: Former Smoker    Packs/day: 0.25    Years: 3.00    Pack years: 0.75    Quit date: 06/06/1985    Years since quitting: 34.5  . Smokeless tobacco: Never Used  . Tobacco comment: Approximately 10-pack-year history  Vaping Use  . Vaping Use: Never used  Substance and Sexual Activity  . Alcohol use: No  . Drug use: No  . Sexual activity: Not Currently  Other Topics Concern  . Not on file  Social History Narrative   Lives in [place] with her son.   Has been widowed for about 3 years.   Social Determinants of Health   Financial Resource Strain: Low Risk   . Difficulty of Paying Living Expenses: Not very hard  Food Insecurity: No Food Insecurity  . Worried About Charity fundraiser in the Last Year: Never true  . Ran Out of Food in the Last Year: Never true  Transportation Needs: No Transportation Needs  . Lack of Transportation (Medical): No  . Lack of Transportation (Non-Medical): No  Physical Activity: Inactive  . Days of Exercise per Week: 0 days  . Minutes of Exercise per Session: 0 min  Stress: No Stress Concern Present  . Feeling of Stress : Only a little  Social Connections: Moderately Integrated  . Frequency of Communication with Friends and Family: More than three times a week  . Frequency of Social Gatherings with Friends and Family: More than three times a week  . Attends Religious Services: More than 4 times per year  . Active Member of Clubs or Organizations: Yes  . Attends Archivist Meetings: More than 4 times per year  . Marital Status: Widowed    Subjective: Review of Systems  Constitutional: Negative for chills, fever, malaise/fatigue and weight loss.  HENT: Negative for congestion and sore throat.   Respiratory: Negative for cough and shortness of breath.   Cardiovascular: Negative for chest pain and palpitations.  Gastrointestinal: Negative for abdominal pain, blood in  stool, diarrhea, melena, nausea and vomiting.  Musculoskeletal: Negative for joint pain and myalgias.  Skin: Negative for rash.  Neurological: Negative for dizziness and weakness.  Endo/Heme/Allergies: Does not bruise/bleed easily.  Psychiatric/Behavioral: Negative for depression. The patient is not nervous/anxious.   All other systems reviewed and are negative.    Objective: BP (!) 149/76   Pulse 74   Temp 97.7 F (36.5 C) (Oral)   Ht 5' (1.524 m)   Wt 139 lb 9.6 oz (63.3 kg)   BMI 27.26 kg/m  Physical Exam Vitals and nursing note reviewed.  Constitutional:      General: She is not in acute distress.    Appearance: Normal appearance. She is well-developed and normal weight. She is not ill-appearing, toxic-appearing or diaphoretic.  HENT:     Head: Normocephalic and atraumatic.     Nose: No congestion or rhinorrhea.  Eyes:     General: No scleral icterus. Cardiovascular:     Rate and Rhythm: Normal rate and regular rhythm.     Heart sounds: Normal heart sounds.  Pulmonary:     Effort: Pulmonary effort is normal. No respiratory distress.     Breath sounds: Normal breath sounds.  Abdominal:     General: Bowel sounds are normal.     Palpations: Abdomen is soft. There is no hepatomegaly, splenomegaly or mass.     Tenderness: There is no abdominal tenderness. There is no guarding or rebound.     Hernia: No hernia is present.  Skin:    General: Skin is warm and dry.     Coloration: Skin is not jaundiced.     Findings: No rash.  Neurological:     General: No focal deficit present.     Mental Status: She is alert and oriented to person, place, and time.  Psychiatric:        Attention and Perception: Attention normal.        Mood and Affect: Mood normal.        Speech: Speech normal.        Behavior: Behavior normal.        Thought Content: Thought content normal.        Cognition and Memory: Cognition and memory normal.  12/17/2019 2:55 PM   Disclaimer: This  note was dictated with voice recognition software. Similar sounding words can inadvertently be transcribed and may not be corrected upon review.

## 2019-12-17 NOTE — Patient Instructions (Signed)
Your health issues we discussed today were:   GERD (reflux/heartburn): 1. Continue taking pantoprazole (omeprazole) twice a day to manage your reflux symptoms 2. Call us if you have any worsening reflux symptoms  Previous rectal bleeding: 1. Let us know if you have any further rectal bleeding  Overall I recommend:  1. Continue your other current medications 2. Return for follow-up in 6 months 3. Call us for any questions or concerns   ---------------------------------------------------------------  I am glad you have gotten your COVID-19 vaccination!  Even though you are fully vaccinated you should continue to follow CDC and state/local guidelines.  --------------------------------------------------------------- At [facility] we value your feedback. You may receive a survey about your visit today. Please share your experience as we strive to create trusting relationships with our patients to provide genuine, compassionate, quality care.  We appreciate your understanding and patience as we review any laboratory studies, imaging, and other diagnostic tests that are ordered as we care for you. Our office policy is 5 business days for review of these results, and any emergent or urgent results are addressed in a timely manner for your best interest. If you do not hear from our office in 1 week, please contact us.   We also encourage the use of MyChart, which contains your medical information for your review as well. If you are not enrolled in this feature, an access code is on this after visit summary for your convenience. Thank you for allowing [place] to be involved in your care.  It was great to see you today!  I hope you have a great Summer!!

## 2019-12-18 ENCOUNTER — Ambulatory Visit: Payer: Medicare HMO | Admitting: Urology

## 2019-12-18 ENCOUNTER — Other Ambulatory Visit: Payer: Self-pay

## 2019-12-18 VITALS — BP 135/68 | HR 65 | Temp 96.8°F | Ht 60.0 in | Wt 137.0 lb

## 2019-12-18 DIAGNOSIS — N3021 Other chronic cystitis with hematuria: Secondary | ICD-10-CM | POA: Diagnosis not present

## 2019-12-18 DIAGNOSIS — N3281 Overactive bladder: Secondary | ICD-10-CM | POA: Diagnosis not present

## 2019-12-18 LAB — POCT URINALYSIS DIPSTICK
Bilirubin, UA: NEGATIVE
Blood, UA: NEGATIVE
Glucose, UA: NEGATIVE
Ketones, UA: NEGATIVE
Nitrite, UA: NEGATIVE
Protein, UA: NEGATIVE
Spec Grav, UA: <=1.005 — AB (ref 1.010–1.025)
Urobilinogen, UA: 0.2 E.U./dL
pH, UA: 5 (ref 5.0–8.0)

## 2019-12-18 MED ORDER — TROSPIUM CHLORIDE ER 60 MG PO CP24: 1.0000 | ORAL_CAPSULE | Freq: Every day | ORAL | 11 refills | Status: DC

## 2019-12-18 NOTE — Progress Notes (Signed)

## 2019-12-20 ENCOUNTER — Telehealth: Payer: Self-pay | Admitting: Urology

## 2019-12-20 NOTE — Telephone Encounter (Signed)
Pharmacy called stating that medication that was recently sent in is not covered by pts ins.

## 2019-12-25 ENCOUNTER — Other Ambulatory Visit: Payer: Self-pay

## 2019-12-25 DIAGNOSIS — N3281 Overactive bladder: Secondary | ICD-10-CM

## 2019-12-25 MED ORDER — SOLIFENACIN SUCCINATE 5 MG PO TABS: 5.0000 mg | ORAL_TABLET | Freq: Every day | ORAL | 3 refills | Status: DC

## 2019-12-25 NOTE — Telephone Encounter (Signed)
Talked with Dr. Alyson Ingles and sent in [place] 5 mg. Called pt. And notified.

## 2019-12-27 ENCOUNTER — Encounter: Payer: Self-pay | Admitting: Urology

## 2019-12-27 NOTE — Patient Instructions (Signed)

## 2019-12-27 NOTE — Progress Notes (Signed)
12/18/2019 8:12 AM   Amanda [name] 02/08/1931 [id number]  Referring provider: [name], DO [place],  [place] [address]  Recurrent UTI  HPI: Amanda [name] is a 31HB here for followup for recurrent UTI and OAB. No UTI since last visit. She is on trimethoprim 100mg  qhs. She has sable urinary urgency and frequency on mirabegron 25mg . NO recent incontinent episodes   PMH: Past Medical History:  Diagnosis Date  . Allergy   . Anxiety   . Coronary artery disease    LAD 30% followed by 60% stenosis; pressure wire measurement demonstrated no significant gradient; circumflex had 30% stenosis, right coronary artery has 40% stenosed; EF 75-80%  . Diverticulosis   . GERD (gastroesophageal reflux disease)   . Hematuria   . Hyperlipidemia   . Hypertension   . Impaired renal function 04/23/2018  . Stroke [facility]) 2003   With a left MCA occlusion  . TIA (transient ischemic attack)     Surgical History: Past Surgical History:  Procedure Laterality Date  . ABDOMINAL HYSTERECTOMY     partial and complete - x  surgeries  . APPENDECTOMY    . CHOLECYSTECTOMY    . COLONOSCOPY N/A 04/19/2019   TI normal, four sessile polyps, 2-6 mm in size, multiple small and large-mouthed diverticula in entire colon, external and internal hemorrhoids, (tubular adenoma, sessile serrated polyp without dysplasia)  . ESOPHAGOGASTRODUODENOSCOPY N/A 04/19/2019   mild schatzki ring s/p dilation, multiple gastric polyps (fundic), non-bleeding duodenal diverticulum, benign small bowel, no villous, no dysplasia)  . EYE SURGERY Bilateral    cataracts  . Hysterectomy-type unspecified    . POLYPECTOMY  04/19/2019   Procedure: POLYPECTOMY;  Surgeon: Danie Binder, MD;  [facility];  Service: Endoscopy;;  colon   . TONSILLECTOMY      Home Medications:  Allergies as of 12/18/2019      Reactions   Livalo [pitavastatin] Other (See Comments)   Causes dizziness   Simvastatin Other (See Comments)     Causes dizziness   Lisinopril    Hallucinations, resolved on ARB      Medication List       Accurate as of December 18, 2019 11:59 PM. If you have any questions, ask your nurse or doctor.        aspirin EC 81 MG tablet Take 81 mg by mouth daily.   atorvastatin 20 MG tablet Commonly known as: LIPITOR Take 1 tablet (20 mg total) by mouth at bedtime. Needs to be seen before next refill   iron polysaccharides 150 MG capsule Commonly known as: NIFEREX Take 150 mg by mouth daily.   loratadine 10 MG tablet Commonly known as: CLARITIN Take 1 tablet (10 mg total) by mouth daily.   Myrbetriq 25 MG Tb24 tablet Generic drug: mirabegron ER TAKE 1 TABLET EVERY DAY   omeprazole 20 MG capsule Commonly known as: PRILOSEC Take 1 capsule (20 mg total) by mouth 2 (two) times daily before a meal.   PreserVision AREDS 2 Caps Take 1 capsule by mouth 2 (two) times a day.   telmisartan 80 MG tablet Commonly known as: MICARDIS Take 1 tablet (80 mg total) by mouth daily.   trimethoprim 100 MG tablet Commonly known as: TRIMPEX Take 1 tablet (100 mg total) by mouth at bedtime.   Trospium Chloride 60 MG Cp24 Take 1 capsule (60 mg total) by mouth daily. Started by: Nicolette Bang, MD   verapamil 180 MG CR tablet Commonly known as: CALAN-SR  TAKE 1 TABLET AT BEDTIME (REPLACES 120MG ).  Needs to be seen for future refills.       Allergies:  Allergies  Allergen Reactions  . Livalo [Pitavastatin] Other (See Comments)    Causes dizziness  . Simvastatin Other (See Comments)    Causes dizziness  . Lisinopril     Hallucinations, resolved on ARB    Family History: Family History  Problem Relation Age of Onset  . Colon cancer Mother 70       dx on colonoscopy   . Cancer Mother        colon  . Cancer Father        unsure if cancer  --- tumor on brain  . Heart attack Sister   . Heart attack Brother   . Diabetes Brother   . GI problems Son   . COPD Sister   . Hypertension Brother    . Heart disease Brother   . Heart attack Brother   . Heart disease Brother   . Alcohol abuse Brother     Social History:  reports that she quit smoking about 34 years ago. She has a 0.75 pack-year smoking history. She has never used smokeless tobacco. She reports that she does not drink alcohol and does not use drugs.  ROS: All other review of systems were reviewed and are negative except what is noted above in HPI  Physical Exam: BP 135/68   Pulse 65   Temp (!) 96.8 F (36 C)   Ht 5' (1.524 m)   Wt 137 lb (62.1 kg)   BMI 26.76 kg/m   Constitutional:  Alert and oriented, No acute distress. HEENT: [place] AT, moist mucus membranes.  Trachea midline, no masses. Cardiovascular: No clubbing, cyanosis, or edema. Respiratory: Normal respiratory effort, no increased work of breathing. GI: Abdomen is soft, nontender, nondistended, no abdominal masses GU: No CVA tenderness.  Lymph: No cervical or inguinal lymphadenopathy. Skin: No rashes, bruises or suspicious lesions. Neurologic: Grossly intact, no focal deficits, moving all 4 extremities. Psychiatric: Normal mood and affect.  Laboratory Data: Lab Results  Component Value Date   WBC 7.8 08/30/2019   HGB 11.8 08/30/2019   HCT 35.8 08/30/2019   MCV 97.8 08/30/2019   PLT 169 08/30/2019    Lab Results  Component Value Date   CREATININE 1.14 (H) 12/31/2018    No results found for: PSA  No results found for: TESTOSTERONE  Lab Results  Component Value Date   HGBA1C 5.6 04/10/2013    Urinalysis    Component Value Date/Time   COLORURINE YELLOW 04/10/2013 2115   APPEARANCEUR Cloudy (A) 05/07/2019 1017   LABSPEC 1.016 04/10/2013 2115   PHURINE 6.5 04/10/2013 2115   GLUCOSEU Negative 05/07/2019 1017   HGBUR NEGATIVE 04/10/2013 2115   BILIRUBINUR neg 12/18/2019 1009   BILIRUBINUR Negative 05/07/2019 1017   KETONESUR NEGATIVE 04/10/2013 2115   PROTEINUR Negative 12/18/2019 1009   PROTEINUR 1+ (A) 05/07/2019 1017    PROTEINUR NEGATIVE 04/10/2013 2115   UROBILINOGEN 0.2 12/18/2019 1009   UROBILINOGEN 1.0 04/10/2013 2115   NITRITE neg 12/18/2019 1009   NITRITE Positive (A) 05/07/2019 1017   NITRITE NEGATIVE 04/10/2013 2115   LEUKOCYTESUR Small (1+) (A) 12/18/2019 1009   LEUKOCYTESUR 3+ (A) 05/07/2019 1017    Lab Results  Component Value Date   LABMICR See below: 05/07/2019   WBCUA >30 (A) 05/07/2019   RBCUA 11-30 (A) 08/14/2018   LABEPIT 0-10 05/07/2019   MUCUS Present 05/07/2019   BACTERIA Many (A)  05/07/2019    Pertinent Imaging:  No results found for this or any previous visit.  No results found for this or any previous visit.  No results found for this or any previous visit.  No results found for this or any previous visit.  Results for orders placed during the hospital encounter of 04/13/18  US RENAL  Narrative CLINICAL DATA:  Chronic kidney disease.  History of hypertension.  EXAM: RENAL / URINARY TRACT ULTRASOUND COMPLETE  COMPARISON:  None.  FINDINGS: Right Kidney:  Renal measurements: 9.9 x 4 x 4.9 cm = volume: 102 mL . Echogenicity within normal limits. No mass or hydronephrosis visualized.  Left Kidney:  Renal measurements: 9.6 x 3.9 x 5.5 cm = volume: 108 mL. Echogenicity within normal limits. No mass or hydronephrosis visualized.  Bladder:  Appears normal for degree of bladder distention. Ureteral jets not identified.  Included liver appears mildly echogenic seen with hepatocellular disease/steatosis.  IMPRESSION: Negative renal ultrasound.   Electronically Signed By: Elon Alas M.D. On: 04/13/2018 14:24  No results found for this or any previous visit.  No results found for this or any previous visit.  No results found for this or any previous visit.   Assessment & Plan:    1. Chronic cystitis with hematuria -trimethoprim 100mg  qhs - POCT urinalysis dipstick  2. OAB (overactive bladder) mirabegron 25mg  daily   Return in  about 3 months (around 03/19/2020).  Nicolette Bang, MD  [facility] [place]

## 2019-12-31 ENCOUNTER — Other Ambulatory Visit: Payer: Self-pay

## 2019-12-31 ENCOUNTER — Ambulatory Visit (INDEPENDENT_AMBULATORY_CARE_PROVIDER_SITE_OTHER): Payer: Medicare HMO | Admitting: Family Medicine

## 2019-12-31 ENCOUNTER — Encounter: Payer: Self-pay | Admitting: Family Medicine

## 2019-12-31 VITALS — BP 136/71 | HR 66 | Temp 98.1°F | Ht 60.0 in | Wt 138.0 lb

## 2019-12-31 DIAGNOSIS — N1832 Chronic kidney disease, stage 3b: Secondary | ICD-10-CM | POA: Diagnosis not present

## 2019-12-31 DIAGNOSIS — N3281 Overactive bladder: Secondary | ICD-10-CM | POA: Diagnosis not present

## 2019-12-31 DIAGNOSIS — I10 Essential (primary) hypertension: Secondary | ICD-10-CM

## 2019-12-31 DIAGNOSIS — E782 Mixed hyperlipidemia: Secondary | ICD-10-CM | POA: Diagnosis not present

## 2019-12-31 NOTE — Progress Notes (Signed)
Subjective: CC: f/u HTN PCP: Amanda Norlander, DO KAJ:GOTL Amanda [name] is a 86 y.o. female presenting to clinic today for:  1.  Hypertension with hyperlipidemia/ CKD3 Patient reports compliance with atorvastatin 20 mg daily, Micardis 80 mg daily and verapamil 180 mg.  No chest pain, shortness of breath, lower extremity edema or falls.  She does have chronic gait instability.  She has follow-up with her nephrologist soon.  She just had labs done last week.  2.  Overactive bladder Patient was recently seen by Dr. Alyson Ingles.  She was continued on her Myrbetriq and trimethoprim.  Apparently her pharmacy had run out of trimethoprim and she gives me a bottle of Vesicare that was sent to "replace it".  She is unsure as to what her actual medication regimen is and has not been taking the Vesicare nor the trimethoprim as a result.  Does not report any hematuria, dysuria.   ROS: Per HPI  Allergies  Allergen Reactions  . Livalo [Pitavastatin] Other (See Comments)    Causes dizziness  . Simvastatin Other (See Comments)    Causes dizziness  . Lisinopril     Hallucinations, resolved on ARB   Past Medical History:  Diagnosis Date  . Allergy   . Anxiety   . Coronary artery disease    LAD 30% followed by 60% stenosis; pressure wire measurement demonstrated no significant gradient; circumflex had 30% stenosis, right coronary artery has 40% stenosed; EF 75-80%  . Diverticulosis   . GERD (gastroesophageal reflux disease)   . Hematuria   . Hyperlipidemia   . Hypertension   . Impaired renal function 04/23/2018  . Stroke [facility]) 2003   With a left MCA occlusion  . TIA (transient ischemic attack)     Current Outpatient Medications:  .  aspirin EC 81 MG tablet, Take 81 mg by mouth daily. , Disp: , Rfl:  .  atorvastatin (LIPITOR) 20 MG tablet, Take 1 tablet (20 mg total) by mouth at bedtime. Needs to be seen before next refill, Disp: 30 tablet, Rfl: 0 .  iron polysaccharides (NIFEREX) 150 MG capsule,  Take 150 mg by mouth daily., Disp: , Rfl:  .  loratadine (CLARITIN) 10 MG tablet, Take 1 tablet (10 mg total) by mouth daily., Disp: 90 tablet, Rfl: 3 .  Multiple Vitamins-Minerals (PRESERVISION AREDS 2) CAPS, Take 1 capsule by mouth 2 (two) times a day., Disp: 180 capsule, Rfl: 2 .  MYRBETRIQ 25 MG TB24 tablet, TAKE 1 TABLET EVERY DAY, Disp: 60 tablet, Rfl: 11 .  omeprazole (PRILOSEC) 20 MG capsule, Take 1 capsule (20 mg total) by mouth 2 (two) times daily before a meal., Disp: 180 capsule, Rfl: 3 .  solifenacin (VESICARE) 5 MG tablet, Take 1 tablet (5 mg total) by mouth daily., Disp: 30 tablet, Rfl: 3 .  telmisartan (MICARDIS) 80 MG tablet, Take 1 tablet (80 mg total) by mouth daily., Disp: 90 tablet, Rfl: 3 .  verapamil (CALAN-SR) 180 MG CR tablet, TAKE 1 TABLET AT BEDTIME (REPLACES 120MG ).  Needs to be seen for future refills., Disp: 30 tablet, Rfl: 0 .  trimethoprim (TRIMPEX) 100 MG tablet, Take 1 tablet (100 mg total) by mouth at bedtime. (Patient not taking: Reported on 12/31/2019), Disp: 90 tablet, Rfl: 3 Social History   Socioeconomic History  . Marital status: Widowed    Spouse name: Not on file  . Number of children: 2  . Years of education: 5  . Highest education level: High school graduate  Occupational History  .  Occupation: Horticulturist, commercial: RETIRED    Comment: Retired  Tobacco Use  . Smoking status: Former Smoker    Packs/day: 0.25    Years: 3.00    Pack years: 0.75    Quit date: 06/06/1985    Years since quitting: 34.5  . Smokeless tobacco: Never Used  . Tobacco comment: Approximately 10-pack-year history  Vaping Use  . Vaping Use: Never used  Substance and Sexual Activity  . Alcohol use: No  . Drug use: No  . Sexual activity: Not Currently  Other Topics Concern  . Not on file  Social History Narrative   Lives in [place] with her son.   Has been widowed for about 3 years.   Social Determinants of Health   Financial Resource Strain: Low Risk   . Difficulty  of Paying Living Expenses: Not very hard  Food Insecurity: No Food Insecurity  . Worried About Charity fundraiser in the Last Year: Never true  . Ran Out of Food in the Last Year: Never true  Transportation Needs: No Transportation Needs  . Lack of Transportation (Medical): No  . Lack of Transportation (Non-Medical): No  Physical Activity: Inactive  . Days of Exercise per Week: 0 days  . Minutes of Exercise per Session: 0 min  Stress: No Stress Concern Present  . Feeling of Stress : Only a little  Social Connections: Moderately Integrated  . Frequency of Communication with Friends and Family: More than three times a week  . Frequency of Social Gatherings with Friends and Family: More than three times a week  . Attends Religious Services: More than 4 times per year  . Active Member of Clubs or Organizations: Yes  . Attends Archivist Meetings: More than 4 times per year  . Marital Status: Widowed  Intimate Partner Violence: Not At Risk  . Fear of Current or Ex-Partner: No  . Emotionally Abused: No  . Physically Abused: No  . Sexually Abused: No   Family History  Problem Relation Age of Onset  . Colon cancer Mother 21       dx on colonoscopy   . Cancer Mother        colon  . Cancer Father        unsure if cancer  --- tumor on brain  . Heart attack Sister   . Heart attack Brother   . Diabetes Brother   . GI problems Son   . COPD Sister   . Hypertension Brother   . Heart disease Brother   . Heart attack Brother   . Heart disease Brother   . Alcohol abuse Brother     Objective: Office vital signs reviewed. BP (!) 136/71   Pulse 66   Temp 98.1 F (36.7 C) (Temporal)   Ht 5' (1.524 m)   Wt 138 lb (62.6 kg)   SpO2 97%   BMI 26.95 kg/m   Physical Examination:  General: Awake, alert, well nourished, No acute distress HEENT: Normal,sclera white, MMM Cardio: regular rate and rhythm, S1S2 heard, no murmurs appreciated Pulm: clear to auscultation bilaterally,  no wheezes, rhonchi or rales; normal work of breathing on room air Extremities: warm, well perfused, No edema, cyanosis or clubbing; +2 pulses bilaterally MSK: slow gait; ambulating independently  Assessment/ Plan: 86 y.o. female   1. Essential hypertension Controlled.  Continue current regimen  2. Stage 3b chronic kidney disease Labs recently obtained by renal.  Will await for results  3. Mixed hyperlipidemia Continue  statin  4. OAB (overactive bladder) I personally reached out to Dr. Alyson Ingles.  He verified that patient should be on Myrbetriq and trimethoprim only.  I have subsequently discontinued Vesicare.  We will reach out to the patient with confirmation of what her medication regimen should be.   No orders of the defined types were placed in this encounter.  No orders of the defined types were placed in this encounter.    Amanda Norlander, DO [place] [phone]

## 2020-01-07 ENCOUNTER — Other Ambulatory Visit: Payer: Self-pay | Admitting: Family Medicine

## 2020-01-15 MED ORDER — SOLIFENACIN SUCCINATE 5 MG PO TABS: 5.0000 mg | ORAL_TABLET | Freq: Every day | ORAL | 3 refills | Status: DC

## 2020-01-15 NOTE — Addendum Note (Signed)
Addended by: Dorisann Frames on: 01/15/2020 04:12 PM   Modules accepted: Orders

## 2020-01-20 ENCOUNTER — Other Ambulatory Visit: Payer: Self-pay | Admitting: *Deleted

## 2020-01-20 DIAGNOSIS — I10 Essential (primary) hypertension: Secondary | ICD-10-CM

## 2020-01-20 MED ORDER — VERAPAMIL HCL ER 180 MG PO TBCR: EXTENDED_RELEASE_TABLET | ORAL | 1 refills | Status: DC

## 2020-01-22 DIAGNOSIS — I129 Hypertensive chronic kidney disease with stage 1 through stage 4 chronic kidney disease, or unspecified chronic kidney disease: Secondary | ICD-10-CM | POA: Diagnosis not present

## 2020-01-22 DIAGNOSIS — E559 Vitamin D deficiency, unspecified: Secondary | ICD-10-CM | POA: Diagnosis not present

## 2020-01-22 DIAGNOSIS — N1832 Chronic kidney disease, stage 3b: Secondary | ICD-10-CM | POA: Diagnosis not present

## 2020-01-22 DIAGNOSIS — I5032 Chronic diastolic (congestive) heart failure: Secondary | ICD-10-CM | POA: Diagnosis not present

## 2020-01-24 ENCOUNTER — Telehealth: Payer: Self-pay | Admitting: Family Medicine

## 2020-01-24 DIAGNOSIS — I10 Essential (primary) hypertension: Secondary | ICD-10-CM

## 2020-01-24 MED ORDER — VERAPAMIL HCL ER 180 MG PO TBCR: EXTENDED_RELEASE_TABLET | ORAL | 0 refills | Status: DC

## 2020-01-24 NOTE — Telephone Encounter (Signed)
Spoke with patient and asked if she receives meds from [facility]. She states that she does but has been waiting 2 weeks for her shipment. Sent 30 day supply in to [facility] until meds arrive from [place]. Patient verbalized understanding

## 2020-01-24 NOTE — Telephone Encounter (Signed)
  Prescription Request  01/24/2020  What is the name of the medication or equipment? Verapamiler  Have you contacted your pharmacy to request a refill? (if applicable) Yes  Which pharmacy would you like this sent to? [facility]   Patient notified that their request is being sent to the clinical staff for review and that they should receive a response within 2 business days.   Is it on a 90 day supply? She isn't sure. She said that she takes it at night.  Is it b/p meds. She has been out of this for 2 weeks now.  [facility].  [facility]

## 2020-03-12 ENCOUNTER — Other Ambulatory Visit: Payer: Self-pay

## 2020-03-12 ENCOUNTER — Other Ambulatory Visit: Payer: Self-pay | Admitting: Family Medicine

## 2020-03-12 ENCOUNTER — Other Ambulatory Visit: Payer: Medicare HMO

## 2020-03-12 DIAGNOSIS — J302 Other seasonal allergic rhinitis: Secondary | ICD-10-CM

## 2020-03-12 DIAGNOSIS — N1832 Chronic kidney disease, stage 3b: Secondary | ICD-10-CM | POA: Diagnosis not present

## 2020-03-12 DIAGNOSIS — E559 Vitamin D deficiency, unspecified: Secondary | ICD-10-CM | POA: Diagnosis not present

## 2020-03-12 DIAGNOSIS — I129 Hypertensive chronic kidney disease with stage 1 through stage 4 chronic kidney disease, or unspecified chronic kidney disease: Secondary | ICD-10-CM | POA: Diagnosis not present

## 2020-03-13 ENCOUNTER — Other Ambulatory Visit: Payer: Self-pay

## 2020-03-13 MED ORDER — MIRABEGRON ER 25 MG PO TB24
25.0000 mg | ORAL_TABLET | Freq: Every day | ORAL | 3 refills | Status: DC
Start: 2020-03-13 — End: 2020-03-31

## 2020-03-19 ENCOUNTER — Other Ambulatory Visit: Payer: Self-pay

## 2020-03-19 ENCOUNTER — Encounter: Payer: Self-pay | Admitting: Urology

## 2020-03-19 ENCOUNTER — Ambulatory Visit (INDEPENDENT_AMBULATORY_CARE_PROVIDER_SITE_OTHER): Payer: Medicare HMO | Admitting: Urology

## 2020-03-19 VITALS — BP 149/78 | HR 66 | Temp 98.1°F | Ht 60.0 in | Wt 138.0 lb

## 2020-03-19 DIAGNOSIS — N3281 Overactive bladder: Secondary | ICD-10-CM | POA: Diagnosis not present

## 2020-03-19 DIAGNOSIS — N390 Urinary tract infection, site not specified: Secondary | ICD-10-CM

## 2020-03-19 LAB — URINALYSIS, ROUTINE W REFLEX MICROSCOPIC
Bilirubin, UA: NEGATIVE
Glucose, UA: NEGATIVE
Ketones, UA: NEGATIVE
Nitrite, UA: POSITIVE — AB
Protein,UA: NEGATIVE
RBC, UA: NEGATIVE
Specific Gravity, UA: 1.015 (ref 1.005–1.030)
Urobilinogen, Ur: 0.2 mg/dL (ref 0.2–1.0)
pH, UA: 5.5 (ref 5.0–7.5)

## 2020-03-19 LAB — MICROSCOPIC EXAMINATION
RBC, Urine: NONE SEEN /hpf (ref 0–2)
Renal Epithel, UA: NONE SEEN /hpf

## 2020-03-19 MED ORDER — SOLIFENACIN SUCCINATE 5 MG PO TABS: 5.0000 mg | ORAL_TABLET | Freq: Every day | ORAL | 3 refills | Status: DC

## 2020-03-19 MED ORDER — TRIMETHOPRIM 100 MG PO TABS: 100.0000 mg | ORAL_TABLET | Freq: Every evening | ORAL | 3 refills | Status: DC

## 2020-03-19 NOTE — Progress Notes (Signed)
03/19/2020 10:08 AM   Amanda [name] 10/12/1930 [id number]  Referring provider: [name], DO [place],  [place] [address]  followup OAB and recurrent UTI  HPI: Ms Amanda [name] is a 3VC here for followup for OAB and recurrent UTI. No UTIs since last visit. She is on trimethoprim 100mg  qhs. She has stable OAB symptoms on vesicare 5mg  daily   PMH: Past Medical History:  Diagnosis Date  . Allergy   . Anxiety   . Coronary artery disease    LAD 30% followed by 60% stenosis; pressure wire measurement demonstrated no significant gradient; circumflex had 30% stenosis, right coronary artery has 40% stenosed; EF 75-80%  . Diverticulosis   . GERD (gastroesophageal reflux disease)   . Hematuria   . Hyperlipidemia   . Hypertension   . Impaired renal function 04/23/2018  . Stroke [facility]) 2003   With a left MCA occlusion  . TIA (transient ischemic attack)     Surgical History: Past Surgical History:  Procedure Laterality Date  . ABDOMINAL HYSTERECTOMY     partial and complete - x  surgeries  . APPENDECTOMY    . CHOLECYSTECTOMY    . COLONOSCOPY N/A 04/19/2019   TI normal, four sessile polyps, 2-6 mm in size, multiple small and large-mouthed diverticula in entire colon, external and internal hemorrhoids, (tubular adenoma, sessile serrated polyp without dysplasia)  . ESOPHAGOGASTRODUODENOSCOPY N/A 04/19/2019   mild schatzki ring s/p dilation, multiple gastric polyps (fundic), non-bleeding duodenal diverticulum, benign small bowel, no villous, no dysplasia)  . EYE SURGERY Bilateral    cataracts  . Hysterectomy-type unspecified    . POLYPECTOMY  04/19/2019   Procedure: POLYPECTOMY;  Surgeon: Amanda Binder, MD;  Location[facility];  Service: Endoscopy;;  colon   . TONSILLECTOMY      Home Medications:  Allergies as of 03/19/2020      Reactions   Livalo [pitavastatin] Other (See Comments)   Causes dizziness   Simvastatin Other (See Comments)   Causes dizziness    Lisinopril    Hallucinations, resolved on ARB      Medication List       Accurate as of March 19, 2020 10:08 AM. If you have any questions, ask your nurse or doctor.        aspirin EC 81 MG tablet Take 81 mg by mouth daily.   aspirin 325 MG EC tablet Take by mouth.   atorvastatin 20 MG tablet Commonly known as: LIPITOR Take 1 tablet (20 mg total) by mouth at bedtime.   carboxymethylcellulose 0.5 % Soln Commonly known as: REFRESH PLUS 1 drop 3 (three) times a day if needed   Cholecalciferol 25 MCG (1000 UT) capsule Take by mouth.   DSS 100 MG Caps Take by mouth.   iron polysaccharides 150 MG capsule Commonly known as: NIFEREX Take 150 mg by mouth daily.   loratadine 10 MG tablet Commonly known as: CLARITIN TAKE 1 TABLET EVERY DAY   losartan 50 MG tablet Commonly known as: COZAAR Take by mouth.   Myrbetriq 25 MG Tb24 tablet Generic drug: mirabegron ER Take by mouth.   mirabegron ER 25 MG Tb24 tablet Commonly known as: Myrbetriq Take 1 tablet (25 mg total) by mouth daily.   omeprazole 20 MG capsule Commonly known as: PRILOSEC Take 1 capsule (20 mg total) by mouth 2 (two) times daily before a meal.   PreserVision AREDS 2 Caps Take 1 capsule by mouth 2 (two) times a day.   simvastatin  20 MG tablet Commonly known as: ZOCOR Take by mouth.   solifenacin 5 MG tablet Commonly known as: VESICARE Take 1 tablet (5 mg total) by mouth daily.   telmisartan 80 MG tablet Commonly known as: MICARDIS Take 1 tablet (80 mg total) by mouth daily.   trimethoprim 100 MG tablet Commonly known as: TRIMPEX Take 1 tablet (100 mg total) by mouth at bedtime.   verapamil 120 MG CR tablet Commonly known as: CALAN-SR Take by mouth. What changed: Another medication with the same name was removed. Continue taking this medication, and follow the directions you see here. Changed by: Amanda Bang, MD       Allergies:  Allergies  Allergen Reactions  . Livalo  [Pitavastatin] Other (See Comments)    Causes dizziness  . Simvastatin Other (See Comments)    Causes dizziness  . Lisinopril     Hallucinations, resolved on ARB    Family History: Family History  Problem Relation Age of Onset  . Colon cancer Mother 24       dx on colonoscopy   . Cancer Mother        colon  . Cancer Father        unsure if cancer  --- tumor on brain  . Heart attack Sister   . Heart attack Brother   . Diabetes Brother   . GI problems Son   . COPD Sister   . Hypertension Brother   . Heart disease Brother   . Heart attack Brother   . Heart disease Brother   . Alcohol abuse Brother     Social History:  reports that she quit smoking about 34 years ago. She has a 0.75 pack-year smoking history. She has never used smokeless tobacco. She reports that she does not drink alcohol and does not use drugs.  ROS: All other review of systems were reviewed and are negative except what is noted above in HPI  Physical Exam: BP (!) 149/78   Pulse 66   Temp 98.1 F (36.7 C)   Ht 5' (1.524 m)   Wt 138 lb (62.6 kg)   BMI 26.95 kg/m   Constitutional:  Alert and oriented, No acute distress. HEENT: [place] AT, moist mucus membranes.  Trachea midline, no masses. Cardiovascular: No clubbing, cyanosis, or edema. Respiratory: Normal respiratory effort, no increased work of breathing. GI: Abdomen is soft, nontender, nondistended, no abdominal masses GU: No CVA tenderness.  Lymph: No cervical or inguinal lymphadenopathy. Skin: No rashes, bruises or suspicious lesions. Neurologic: Grossly intact, no focal deficits, moving all 4 extremities. Psychiatric: Normal mood and affect.  Laboratory Data: Lab Results  Component Value Date   WBC 7.8 08/30/2019   HGB 11.8 08/30/2019   HCT 35.8 08/30/2019   MCV 97.8 08/30/2019   PLT 169 08/30/2019    Lab Results  Component Value Date   CREATININE 1.14 (H) 12/31/2018    No results found for: PSA  No results found for:  TESTOSTERONE  Lab Results  Component Value Date   HGBA1C 5.6 04/10/2013    Urinalysis    Component Value Date/Time   COLORURINE YELLOW 04/10/2013 2115   APPEARANCEUR Clear 03/19/2020 0930   LABSPEC 1.016 04/10/2013 2115   PHURINE 6.5 04/10/2013 2115   GLUCOSEU Negative 03/19/2020 0930   HGBUR NEGATIVE 04/10/2013 2115   BILIRUBINUR Negative 03/19/2020 0930   KETONESUR NEGATIVE 04/10/2013 2115   PROTEINUR Negative 03/19/2020 0930   PROTEINUR NEGATIVE 04/10/2013 2115   UROBILINOGEN 0.2 12/18/2019 1009   UROBILINOGEN  1.0 04/10/2013 2115   NITRITE Positive (A) 03/19/2020 0930   NITRITE NEGATIVE 04/10/2013 2115   LEUKOCYTESUR 1+ (A) 03/19/2020 0930    Lab Results  Component Value Date   LABMICR See below: 03/19/2020   WBCUA 6-10 (A) 03/19/2020   RBCUA 11-30 (A) 08/14/2018   LABEPIT 0-10 03/19/2020   MUCUS Present 05/07/2019   BACTERIA Many (A) 03/19/2020    Pertinent Imaging:  No results found for this or any previous visit.  No results found for this or any previous visit.  No results found for this or any previous visit.  No results found for this or any previous visit.  Results for orders placed during the hospital encounter of 04/13/18  US RENAL  Narrative CLINICAL DATA:  Chronic kidney disease.  History of hypertension.  EXAM: RENAL / URINARY TRACT ULTRASOUND COMPLETE  COMPARISON:  None.  FINDINGS: Right Kidney:  Renal measurements: 9.9 x 4 x 4.9 cm = volume: 102 mL . Echogenicity within normal limits. No mass or hydronephrosis visualized.  Left Kidney:  Renal measurements: 9.6 x 3.9 x 5.5 cm = volume: 108 mL. Echogenicity within normal limits. No mass or hydronephrosis visualized.  Bladder:  Appears normal for degree of bladder distention. Ureteral jets not identified.  Included liver appears mildly echogenic seen with hepatocellular disease/steatosis.  IMPRESSION: Negative renal ultrasound.   Electronically Signed By: Elon Alas M.D. On: 04/13/2018 14:24  No results found for this or any previous visit.  No results found for this or any previous visit.  No results found for this or any previous visit.   Assessment & Plan:    1. Recurrent UTI -continue trimethoprim - Urinalysis, Routine w reflex microscopic  2. OAB (overactive bladder) -continue vesicare 5mg  daily   No follow-ups on file.  Amanda Bang, MD  [facility] [place]

## 2020-03-19 NOTE — Progress Notes (Signed)
Urological Symptom Review  Patient is experiencing the following symptoms: Get up at night to urinate Leakage of urine Stream starts and stops   Review of Systems  Gastrointestinal (upper)  : Negative for upper GI symptoms  Gastrointestinal (lower) : Constipation  Constitutional : Negative for symptoms  Skin: Skin rash/lesion  Eyes: Double vision  Ear/Nose/Throat : Negative for Ear/Nose/Throat symptoms  Hematologic/Lymphatic: Negative for Hematologic/Lymphatic symptoms  Cardiovascular : Negative for cardiovascular symptoms  Respiratory : Negative for respiratory symptoms  Endocrine: Negative for endocrine symptoms  Musculoskeletal: Negative for musculoskeletal symptoms  Neurological: Negative for neurological symptoms  Psychologic: Negative for psychiatric symptoms

## 2020-03-19 NOTE — Patient Instructions (Signed)

## 2020-03-23 ENCOUNTER — Other Ambulatory Visit: Payer: Self-pay | Admitting: Family Medicine

## 2020-03-25 ENCOUNTER — Other Ambulatory Visit: Payer: Self-pay | Admitting: Family Medicine

## 2020-03-25 ENCOUNTER — Other Ambulatory Visit: Payer: Self-pay | Admitting: Nurse Practitioner

## 2020-03-25 DIAGNOSIS — J302 Other seasonal allergic rhinitis: Secondary | ICD-10-CM

## 2020-03-25 DIAGNOSIS — K219 Gastro-esophageal reflux disease without esophagitis: Secondary | ICD-10-CM

## 2020-03-25 DIAGNOSIS — D649 Anemia, unspecified: Secondary | ICD-10-CM

## 2020-03-25 DIAGNOSIS — K5900 Constipation, unspecified: Secondary | ICD-10-CM

## 2020-03-31 ENCOUNTER — Other Ambulatory Visit: Payer: Self-pay

## 2020-04-01 ENCOUNTER — Telehealth: Payer: Self-pay

## 2020-04-01 DIAGNOSIS — R809 Proteinuria, unspecified: Secondary | ICD-10-CM | POA: Diagnosis not present

## 2020-04-01 DIAGNOSIS — I129 Hypertensive chronic kidney disease with stage 1 through stage 4 chronic kidney disease, or unspecified chronic kidney disease: Secondary | ICD-10-CM | POA: Diagnosis not present

## 2020-04-01 DIAGNOSIS — E559 Vitamin D deficiency, unspecified: Secondary | ICD-10-CM | POA: Diagnosis not present

## 2020-04-01 DIAGNOSIS — I5032 Chronic diastolic (congestive) heart failure: Secondary | ICD-10-CM | POA: Diagnosis not present

## 2020-04-01 DIAGNOSIS — N1832 Chronic kidney disease, stage 3b: Secondary | ICD-10-CM | POA: Diagnosis not present

## 2020-04-01 NOTE — Telephone Encounter (Signed)
[facility] called needing a refill on Myrbetriq. Called to notify them dr has d/c this med.

## 2020-05-04 ENCOUNTER — Encounter: Payer: Self-pay | Admitting: *Deleted

## 2020-05-04 ENCOUNTER — Ambulatory Visit (INDEPENDENT_AMBULATORY_CARE_PROVIDER_SITE_OTHER): Payer: Medicare HMO | Admitting: Family Medicine

## 2020-05-04 ENCOUNTER — Other Ambulatory Visit: Payer: Self-pay

## 2020-05-04 VITALS — BP 134/80 | HR 60 | Temp 98.2°F | Ht 60.0 in | Wt 137.0 lb

## 2020-05-04 DIAGNOSIS — R110 Nausea: Secondary | ICD-10-CM

## 2020-05-04 DIAGNOSIS — N1832 Chronic kidney disease, stage 3b: Secondary | ICD-10-CM | POA: Diagnosis not present

## 2020-05-04 DIAGNOSIS — I129 Hypertensive chronic kidney disease with stage 1 through stage 4 chronic kidney disease, or unspecified chronic kidney disease: Secondary | ICD-10-CM | POA: Diagnosis not present

## 2020-05-04 DIAGNOSIS — E782 Mixed hyperlipidemia: Secondary | ICD-10-CM | POA: Diagnosis not present

## 2020-05-04 DIAGNOSIS — E559 Vitamin D deficiency, unspecified: Secondary | ICD-10-CM | POA: Diagnosis not present

## 2020-05-04 DIAGNOSIS — R6889 Other general symptoms and signs: Secondary | ICD-10-CM | POA: Diagnosis not present

## 2020-05-04 DIAGNOSIS — I5032 Chronic diastolic (congestive) heart failure: Secondary | ICD-10-CM | POA: Diagnosis not present

## 2020-05-04 DIAGNOSIS — I10 Essential (primary) hypertension: Secondary | ICD-10-CM | POA: Diagnosis not present

## 2020-05-04 NOTE — Patient Instructions (Signed)
Take the evening dose of Omeprazole before supper.  This hopefully will help with your nausea.  You had labs performed today.  You will be contacted with the results of the labs once they are available, usually in the next 3 business days for routine lab work.  If you have an active my chart account, they will be released to your MyChart.  If you prefer to have these labs released to you via telephone, please let us know.  If you had a pap smear or biopsy performed, expect to be contacted in about 7-10 days.

## 2020-05-04 NOTE — Progress Notes (Signed)
Subjective: CC: Follow-up hypertension with CKD, hyperlipidemia PCP: Amanda Norlander, DO Amanda [name] is a 86 y.o. female presenting to clinic today for:  1.  Hypertension with hyperlipidemia, CKD 3 Patient is followed by Dr. Theador Hawthorne for CKD 3.  She has laboratory drawl that needs to be done today  She reports good urine output.  No chest pain, shortness of breath.  She has occasional edema of the legs that seem to be most prominent after Thanksgiving.  This seems to be resolving.  She does have occasional dizziness that is most present in the morning when she wakes up.  She had a fall yesterday but she denies that being preceded by dizziness.  She feels like she tripped over something.  Balance continues to be an issue for her.  2.  Nausea Patient reports nausea that occurs pretty much daily after supper time.  This is been present for a while now.  She is treated with omeprazole 20 mg twice daily by gastroenterology.  Denies any hematochezia, melena, and vomiting or unplanned weight loss.  She is tolerating p.o. without difficulty.   ROS: Per HPI  Allergies  Allergen Reactions   Livalo [Pitavastatin] Other (See Comments)    Causes dizziness   Simvastatin Other (See Comments)    Causes dizziness   Lisinopril     Hallucinations, resolved on ARB   Past Medical History:  Diagnosis Date   Allergy    Anxiety    Coronary artery disease    LAD 30% followed by 60% stenosis; pressure wire measurement demonstrated no significant gradient; circumflex had 30% stenosis, right coronary artery has 40% stenosed; EF 75-80%   Diverticulosis    GERD (gastroesophageal reflux disease)    Hematuria    Hyperlipidemia    Hypertension    Impaired renal function 04/23/2018   Stroke ([place]) 2003   With a left MCA occlusion   TIA (transient ischemic attack)     Current Outpatient Medications:    aspirin EC 81 MG tablet, Take 81 mg by mouth daily. , Disp: , Rfl:     atorvastatin (LIPITOR) 20 MG tablet, TAKE 1 TABLET AT BEDTIME, Disp: 90 tablet, Rfl: 0   Cholecalciferol 25 MCG (1000 UT) capsule, Take by mouth., Disp: , Rfl:    Docusate Sodium (DSS) 100 MG CAPS, Take by mouth., Disp: , Rfl:    iron polysaccharides (NIFEREX) 150 MG capsule, Take 150 mg by mouth daily., Disp: , Rfl:    Multiple Vitamins-Minerals (PRESERVISION AREDS 2) CAPS, Take 1 capsule by mouth 2 (two) times a day., Disp: 180 capsule, Rfl: 2   omeprazole (PRILOSEC) 20 MG capsule, TAKE 1 CAPSULE (20 MG TOTAL) BY MOUTH 2 (TWO) TIMES DAILY BEFORE A MEAL., Disp: 180 capsule, Rfl: 3   solifenacin (VESICARE) 5 MG tablet, Take 1 tablet (5 mg total) by mouth daily., Disp: 90 tablet, Rfl: 3   telmisartan (MICARDIS) 80 MG tablet, Take 1 tablet (80 mg total) by mouth daily., Disp: 90 tablet, Rfl: 3   trimethoprim (TRIMPEX) 100 MG tablet, Take 1 tablet (100 mg total) by mouth at bedtime., Disp: 90 tablet, Rfl: 3   verapamil (CALAN-SR) 120 MG CR tablet, Take by mouth., Disp: , Rfl:  Social History   Socioeconomic History   Marital status: Widowed    Spouse name: Not on file   Number of children: 2   Years of education: 12   Highest education level: High school graduate  Occupational History   Occupation: [employer]  Employer: RETIRED    Comment: Retired  Tobacco Use   Smoking status: Former Smoker    Packs/day: 0.25    Years: 3.00    Pack years: 0.75    Quit date: 06/06/1985    Years since quitting: 34.9   Smokeless tobacco: Never Used   Tobacco comment: Approximately 10-pack-year history  Vaping Use   Vaping Use: Never used  Substance and Sexual Activity   Alcohol use: No   Drug use: No   Sexual activity: Not Currently  Other Topics Concern   Not on file  Social History Narrative   Lives in [place] with her son.   Has been widowed for about 3 years.   Social Determinants of Health   Financial Resource Strain:    Difficulty of Paying Living Expenses: Not on file   Food Insecurity:    Worried About Charity fundraiser in the Last Year: Not on file   [employer] of Food in the Last Year: Not on file  Transportation Needs:    Lack of Transportation (Medical): Not on file   Lack of Transportation (Non-Medical): Not on file  Physical Activity:    Days of Exercise per Week: Not on file   Minutes of Exercise per Session: Not on file  Stress:    Feeling of Stress : Not on file  Social Connections:    Frequency of Communication with Friends and Family: Not on file   Frequency of Social Gatherings with Friends and Family: Not on file   Attends Religious Services: Not on file   Active Member of Clubs or Organizations: Not on file   Attends Archivist Meetings: Not on file   Marital Status: Not on file  Intimate Partner Violence:    Fear of Current or Ex-Partner: Not on file   Emotionally Abused: Not on file   Physically Abused: Not on file   Sexually Abused: Not on file   Family History  Problem Relation Age of Onset   Colon cancer Mother 63       dx on colonoscopy    Cancer Mother        colon   Cancer Father        unsure if cancer  --- tumor on brain   Heart attack Sister    Heart attack Brother    Diabetes Brother    GI problems Son    COPD Sister    Hypertension Brother    Heart disease Brother    Heart attack Brother    Heart disease Brother    Alcohol abuse Brother     Objective: Office vital signs reviewed. BP 134/80    Pulse 60    Temp 98.2 F (36.8 C) (Temporal)    Ht 5' (1.524 m)    Wt 137 lb (62.1 kg)    SpO2 95%    BMI 26.76 kg/m   Physical Examination:  General: Awake, alert, well nourished, No acute distress HEENT: Normal; sclera white Cardio: regular rate and rhythm, S1S2 heard, no murmurs appreciated Pulm: clear to auscultation bilaterally, no wheezes, rhonchi or rales; normal work of breathing on room air Extremities: warm, well perfused, No edema, cyanosis or clubbing; +2  pulses bilaterally MSK ambulating independently  Assessment/ Plan: 86 y.o. female   Essential hypertension - Plan: CMP14+EGFR  Stage 3b chronic kidney disease (HCC) - Plan: CMP14+EGFR, VITAMIN D 25 Hydroxy (Vit-D Deficiency, Fractures)  Mixed hyperlipidemia - Plan: CMP14+EGFR, Lipid Panel  Nausea   Nonfasting labs  obtained today.  Blood pressure under good control.  She also brings labs that need to be collected from her kidney doctor today  I question if her nausea is actually uncontrolled GERD.  I advised her to use her PPI prior to supper each night rather than after.  Hopefully this will resolve this issue.  Otherwise, discussed that perhaps we should have her see her gastroenterologist again for further evaluation.  She voiced good understanding.   No orders of the defined types were placed in this encounter.  No orders of the defined types were placed in this encounter.   Amanda Norlander, DO [place] [phone]

## 2020-05-05 LAB — CMP14+EGFR
ALT: 10 IU/L (ref 0–32)
AST: 11 IU/L (ref 0–40)
Albumin/Globulin Ratio: 2 (ref 1.2–2.2)
Albumin: 4.3 g/dL (ref 3.6–4.6)
Alkaline Phosphatase: 93 IU/L (ref 44–121)
BUN/Creatinine Ratio: 14 (ref 12–28)
BUN: 16 mg/dL (ref 8–27)
Bilirubin Total: 0.5 mg/dL (ref 0.0–1.2)
CO2: 20 mmol/L (ref 20–29)
Calcium: 10.5 mg/dL — ABNORMAL HIGH (ref 8.7–10.3)
Chloride: 107 mmol/L — ABNORMAL HIGH (ref 96–106)
Creatinine, Ser: 1.11 mg/dL — ABNORMAL HIGH (ref 0.57–1.00)
GFR calc Af Amer: 52 mL/min/{1.73_m2} — ABNORMAL LOW (ref >59)
GFR calc non Af Amer: 45 mL/min/{1.73_m2} — ABNORMAL LOW (ref >59)
Globulin, Total: 2.1 g/dL (ref 1.5–4.5)
Glucose: 102 mg/dL — ABNORMAL HIGH (ref 65–99)
Potassium: 4.5 mmol/L (ref 3.5–5.2)
Sodium: 140 mmol/L (ref 134–144)
Total Protein: 6.4 g/dL (ref 6.0–8.5)

## 2020-05-05 LAB — VITAMIN D 25 HYDROXY (VIT D DEFICIENCY, FRACTURES): Vit D, 25-Hydroxy: 30.8 ng/mL (ref 30.0–100.0)

## 2020-05-05 LAB — LIPID PANEL
Chol/HDL Ratio: 2.7 ratio (ref 0.0–4.4)
Cholesterol, Total: 105 mg/dL (ref 100–199)
HDL: 39 mg/dL — ABNORMAL LOW (ref >39)
LDL Chol Calc (NIH): 25 mg/dL (ref 0–99)
Triglycerides: 281 mg/dL — ABNORMAL HIGH (ref 0–149)
VLDL Cholesterol Cal: 41 mg/dL — ABNORMAL HIGH (ref 5–40)

## 2020-05-14 DIAGNOSIS — H04123 Dry eye syndrome of bilateral lacrimal glands: Secondary | ICD-10-CM | POA: Diagnosis not present

## 2020-05-14 DIAGNOSIS — H40033 Anatomical narrow angle, bilateral: Secondary | ICD-10-CM | POA: Diagnosis not present

## 2020-05-26 ENCOUNTER — Other Ambulatory Visit: Payer: Self-pay | Admitting: Family Medicine

## 2020-05-26 DIAGNOSIS — I10 Essential (primary) hypertension: Secondary | ICD-10-CM

## 2020-06-23 ENCOUNTER — Ambulatory Visit: Payer: Medicare HMO | Admitting: Nurse Practitioner

## 2020-06-24 ENCOUNTER — Other Ambulatory Visit: Payer: Self-pay | Admitting: Family Medicine

## 2020-06-24 DIAGNOSIS — I10 Essential (primary) hypertension: Secondary | ICD-10-CM

## 2020-06-24 DIAGNOSIS — J302 Other seasonal allergic rhinitis: Secondary | ICD-10-CM

## 2020-07-28 ENCOUNTER — Other Ambulatory Visit: Payer: Self-pay

## 2020-07-28 ENCOUNTER — Encounter: Payer: Self-pay | Admitting: Nurse Practitioner

## 2020-07-28 ENCOUNTER — Ambulatory Visit: Payer: Medicare PPO | Admitting: Nurse Practitioner

## 2020-07-28 VITALS — BP 134/72 | HR 64 | Temp 97.3°F | Ht 60.0 in | Wt 138.2 lb

## 2020-07-28 DIAGNOSIS — K219 Gastro-esophageal reflux disease without esophagitis: Secondary | ICD-10-CM

## 2020-07-28 DIAGNOSIS — R109 Unspecified abdominal pain: Secondary | ICD-10-CM | POA: Insufficient documentation

## 2020-07-28 DIAGNOSIS — R1030 Lower abdominal pain, unspecified: Secondary | ICD-10-CM | POA: Diagnosis not present

## 2020-07-28 DIAGNOSIS — R1310 Dysphagia, unspecified: Secondary | ICD-10-CM | POA: Insufficient documentation

## 2020-07-28 DIAGNOSIS — K5900 Constipation, unspecified: Secondary | ICD-10-CM

## 2020-07-28 DIAGNOSIS — R1319 Other dysphagia: Secondary | ICD-10-CM | POA: Diagnosis not present

## 2020-07-28 NOTE — Progress Notes (Signed)
Cc'ed to pcp °

## 2020-07-28 NOTE — Progress Notes (Signed)
Referring Provider: [name], DO Primary Care Physician:  [name], DO Primary GI:  Dr. Abbey Chatters  Chief Complaint  Patient presents with  . Gastroesophageal Reflux  . Nausea    Feels like emesis comes up then stops; occurs when starts to eat    HPI:   Amanda [name] is a 87 y.o. female who presents for follow-up on GERD and rectal bleeding.  Patient was last seen in our office 12/17/2019 for the same.  At that time noted history of GERD, constipation, rectal bleeding.  EGD and colonoscopy in November 2020 with likely source of hematochezia diverticular related and internal hemorrhoids, EGD without obvious source.  Previously recommended NSAID avoidance and PPI twice daily.  Previous mild decline in hemoglobin from 12.9-11.8, otherwise iron studies and other labs unremarkable.  At her last visit GERD doing well on PPI, significantly improved on twice daily dosing and trigger avoidance.  No other overt GI complaints.  She is working with primary care and being cautious to prevent further falls.  Specifically noted no recurrent bleeding since her last visit.  Recommended she continue her medications, notify us of any rectal bleeding, follow-up in 6 months.  No updated CBC in our system since her last visit.  Today she states doing okay overall. GERD continues to do well on PPI, rare breakthrough. Feels like food will come up like she wants to vomit. When specifically asked, she notes food gets "stuck on the way down" about every day. No regurgitation, usually passes with time. She takes small bites, chews thoroughly, cuts meats "into really tiny pieces." She notes "I've had to do that for years and years."  Of note, she had an EGD with dilation 04/19/2019 for dysphagia with noted mild Schatzki ring at the GE junction. Thinks dysphagia has returned over the past month. She has also had a lot of stress recently as her grandson passed a month ago. She has also had a son and brother  pass recently as well. Has mid-abdominal pain associated with constipation including straining and hard stools; pain resolves with a bowel movement. Only on Colace, is on Iron as well. Denies ongoing N/V, hematochezia, melena, fever, chills, unintentional weight loss. She notes an irritating cough as different times in the day. She has an upcoming PCP visit and will discuss with them at that time. Denies URI or flu-like symptoms. Denies loss of sense of taste or smell. The patient has received COVID-19 vaccination(s). They have had a booster dose as well. Denies chest pain, dyspnea, dizziness, lightheadedness, syncope, near syncope. Denies any other upper or lower GI symptoms.  Past Medical History:  Diagnosis Date  . Allergy   . Anxiety   . Coronary artery disease    LAD 30% followed by 60% stenosis; pressure wire measurement demonstrated no significant gradient; circumflex had 30% stenosis, right coronary artery has 40% stenosed; EF 75-80%  . Diverticulosis   . GERD (gastroesophageal reflux disease)   . Hematuria   . Hyperlipidemia   . Hypertension   . Impaired renal function 04/23/2018  . Stroke [facility]) 2003   With a left MCA occlusion  . TIA (transient ischemic attack)     Past Surgical History:  Procedure Laterality Date  . ABDOMINAL HYSTERECTOMY     partial and complete - x  surgeries  . APPENDECTOMY    . CHOLECYSTECTOMY    . COLONOSCOPY N/A 04/19/2019   TI normal, four sessile polyps, 2-6 mm in size, multiple small and  large-mouthed diverticula in entire colon, external and internal hemorrhoids, (tubular adenoma, sessile serrated polyp without dysplasia)  . ESOPHAGOGASTRODUODENOSCOPY N/A 04/19/2019   mild schatzki ring s/p dilation, multiple gastric polyps (fundic), non-bleeding duodenal diverticulum, benign small bowel, no villous, no dysplasia)  . EYE SURGERY Bilateral    cataracts  . Hysterectomy-type unspecified    . POLYPECTOMY  04/19/2019   Procedure: POLYPECTOMY;   Surgeon: Danie Binder, MD;  Location: [facility];  Service: Endoscopy;;  colon   . TONSILLECTOMY      Current Outpatient Medications  Medication Sig Dispense Refill  . aspirin EC 81 MG tablet Take 81 mg by mouth daily.     Marland Kitchen atorvastatin (LIPITOR) 20 MG tablet TAKE 1 TABLET AT BEDTIME 90 tablet 0  . Cholecalciferol 25 MCG (1000 UT) capsule Take 1,000 Units by mouth daily.    Mariane Baumgarten Sodium (DSS) 100 MG CAPS Take 1 capsule by mouth as needed.    . iron polysaccharides (NIFEREX) 150 MG capsule Take 150 mg by mouth daily.    Marland Kitchen loratadine (CLARITIN) 10 MG tablet TAKE 1 TABLET EVERY DAY 90 tablet 1  . Multiple Vitamins-Minerals (PRESERVISION AREDS 2) CAPS Take 1 capsule by mouth 2 (two) times a day. 180 capsule 2  . omeprazole (PRILOSEC) 20 MG capsule TAKE 1 CAPSULE (20 MG TOTAL) BY MOUTH 2 (TWO) TIMES DAILY BEFORE A MEAL. 180 capsule 3  . solifenacin (VESICARE) 5 MG tablet Take 1 tablet (5 mg total) by mouth daily. 90 tablet 3  . telmisartan (MICARDIS) 80 MG tablet Take 1 tablet (80 mg total) by mouth daily. 90 tablet 3  . trimethoprim (TRIMPEX) 100 MG tablet Take 1 tablet (100 mg total) by mouth at bedtime. 90 tablet 3  . verapamil (CALAN-SR) 180 MG CR tablet TAKE 1 TABLET AT BEDTIME (REPLACES 120MG ). 90 tablet 1   No current facility-administered medications for this visit.    Allergies as of 07/28/2020 - Review Complete 07/28/2020  Allergen Reaction Noted  . Livalo [pitavastatin] Other (See Comments) 05/19/2012  . Simvastatin Other (See Comments) 05/19/2012  . Lisinopril  09/14/2017    Family History  Problem Relation Age of Onset  . Colon cancer Mother 46       dx on colonoscopy   . Cancer Mother        colon  . Cancer Father        unsure if cancer  --- tumor on brain  . Heart attack Sister   . Heart attack Brother   . Diabetes Brother   . GI problems Son   . COPD Sister   . Hypertension Brother   . Heart disease Brother   . Heart attack Brother   . Heart  disease Brother   . Alcohol abuse Brother     Social History   Socioeconomic History  . Marital status: Widowed    Spouse name: Not on file  . Number of children: 2  . Years of education: 10  . Highest education level: High school graduate  Occupational History  . Occupation: Horticulturist, commercial: RETIRED    Comment: Retired  Tobacco Use  . Smoking status: Former Smoker    Packs/day: 0.25    Years: 3.00    Pack years: 0.75    Quit date: 06/06/1985    Years since quitting: 35.1  . Smokeless tobacco: Never Used  . Tobacco comment: Approximately 10-pack-year history  Vaping Use  . Vaping Use: Never used  Substance and Sexual  Activity  . Alcohol use: No  . Drug use: No  . Sexual activity: Not Currently  Other Topics Concern  . Not on file  Social History Narrative   Lives in [place] with her son.   Has been widowed for about 3 years.   Social Determinants of Health   Financial Resource Strain: Not on file  Food Insecurity: Not on file  Transportation Needs: Not on file  Physical Activity: Not on file  Stress: Not on file  Social Connections: Not on file    Subjective: Review of Systems  Constitutional: Negative for chills, fever, malaise/fatigue and weight loss.  HENT: Negative for congestion and sore throat.   Respiratory: Negative for cough and shortness of breath.   Cardiovascular: Negative for chest pain and palpitations.  Gastrointestinal: Positive for abdominal pain and constipation. Negative for blood in stool, diarrhea, heartburn, melena, nausea and vomiting.  Musculoskeletal: Negative for joint pain and myalgias.  Skin: Negative for rash.  Neurological: Negative for dizziness and weakness.  Endo/Heme/Allergies: Does not bruise/bleed easily.  Psychiatric/Behavioral: Negative for depression. The patient is not nervous/anxious.   All other systems reviewed and are negative.    Objective: BP 134/72   Pulse 64   Temp (!) 97.3 F (36.3 C) (Temporal)   Ht  5' (1.524 m)   Wt 138 lb 3.2 oz (62.7 kg)   BMI 26.99 kg/m  Physical Exam Vitals and nursing note reviewed.  Constitutional:      General: She is not in acute distress.    Appearance: Normal appearance. She is well-developed and normal weight. She is not ill-appearing, toxic-appearing or diaphoretic.  HENT:     Head: Normocephalic and atraumatic.     Nose: No congestion or rhinorrhea.  Eyes:     General: No scleral icterus. Cardiovascular:     Rate and Rhythm: Normal rate and regular rhythm.     Heart sounds: Normal heart sounds.  Pulmonary:     Effort: Pulmonary effort is normal. No respiratory distress.     Breath sounds: Normal breath sounds.  Abdominal:     General: Bowel sounds are normal.     Palpations: Abdomen is soft. There is no hepatomegaly, splenomegaly or mass.     Tenderness: There is no abdominal tenderness. There is no guarding or rebound.     Hernia: No hernia is present.  Skin:    General: Skin is warm and dry.     Coloration: Skin is not jaundiced.     Findings: No rash.  Neurological:     General: No focal deficit present.     Mental Status: She is alert and oriented to person, place, and time.  Psychiatric:        Attention and Perception: Attention normal.        Mood and Affect: Mood normal.        Speech: Speech normal.        Behavior: Behavior normal.        Thought Content: Thought content normal.        Cognition and Memory: Cognition and memory normal.      Assessment:  Very pleasant 87 year old female who presents to follow-up on GERD and rectal bleeding. Also noted constipation and dysphagia at this visit. Clinically she is doing well overall. Recent recurrence of dysphagia status post endoscopy in 2020 with dilation. GERD doing well. No red flag/warning signs or symptoms otherwise.  GERD: Her reflux is currently well managed on her current PPI. Recommend she continue  this for now and notify us of any worsening  Dysphagia: History of  dysphagia status post EGD with esophageal dilation due to mild Schatzki's ring at the GE junction in 2020. It appears she is done well since then. Began having recurrent dysphagia symptoms about a month ago. She does take appropriate precautions according to her description. We discussed possibility of repeat endoscopy and dilation, she is electing to hold off for right now and reassess the situation at her follow-up. ER precautions were given for persistent, severe dysphagia/food impaction.  Constipation: Notes regular mid abdominal discomfort with constipation including hard stools and straining that improves after a bowel movement. She is only on Colace currently. At this point we decided to hold Colace and start Linzess 72 mcg with a progress report in 1 to 2 weeks, samples were provided. Further recommendations to follow her response to Linzess. Cannot recall for dose titration or switch to it another option if persistent diarrhea on Linzess.   Plan: 1. Continue PPI 2. Linzess 72 mcg daily 3. Hold Colace for now 4. Follow-up in 3 months at which point we can rediscuss possible EGD/dilation depending on her clinical progress    Thank you for allowing [place] to participate in the care of Amanda [place], DNP, AGNP-C Adult & Gerontological Nurse Practitioner [facility]   07/28/2020 10:17 AM   Disclaimer: This note was dictated with voice recognition software. Similar sounding words can inadvertently be transcribed and may not be corrected upon review.

## 2020-07-28 NOTE — Patient Instructions (Signed)
Your health issues we discussed today were:   GERD (reflux/heartburn) with dysphagia (swallowing difficulties: 1. Continue taking your acid blocker 2. Let Amanda [name] know if you have any worsening reflux 3. Continue to chew your food thoroughly, cut meats into small bites, take small bites. 4. As we discussed, food gets stuck and will not go forward or come backward for longer than 1 to 2 hours then proceed to the emergency room 5. At her follow-up we can discuss EGD with possible dilation to help her swallowing difficulties 6. Call Amanda [name] for any worsening or severe symptoms  Constipation with abdominal pain: 1. Stop taking Colace for now 2. Start taking Linzess 72 mcg once a day on an empty stomach 3. I am giving you samples to last 1 to 2 weeks 4. Call Amanda [name] with a progress for 1 to 2 weeks and let Amanda [name] know if this is helping your stools 5. If you develop severe diarrhea that is intolerable or lasts longer than 3 to 5 days, stop Linzess and call our office 6. There are other options for constipation medications of Linzess does not work well for you 7. Call for any worsening or severe symptoms  Overall I recommend:  1. Continue other current medications 2. Return for follow-up in 3 months 3. Call if you have any questions or concerns   ---------------------------------------------------------------  I am glad you have gotten your COVID-19 vaccination!  Even though you are fully vaccinated you should continue to follow CDC and state/local guidelines.  ---------------------------------------------------------------   At [facility] we value your feedback. You may receive a survey about your visit today. Please share your experience as we strive to create trusting relationships with our patients to provide genuine, compassionate, quality care.  We appreciate your understanding and patience as we review any laboratory studies, imaging, and other diagnostic tests that are ordered as we  care for you. Our office policy is 5 business days for review of these results, and any emergent or urgent results are addressed in a timely manner for your best interest. If you do not hear from our office in 1 week, please contact Amanda [name].   We also encourage the use of MyChart, which contains your medical information for your review as well. If you are not enrolled in this feature, an access code is on this after visit summary for your convenience. Thank you for allowing Amanda [name] to be involved in your care.  It was great to see you today!  I hope you have a great end of winter and early spring!!

## 2020-08-26 ENCOUNTER — Other Ambulatory Visit: Payer: Self-pay | Admitting: Family Medicine

## 2020-09-09 ENCOUNTER — Other Ambulatory Visit: Payer: Self-pay | Admitting: Family Medicine

## 2020-09-16 ENCOUNTER — Encounter: Payer: Self-pay | Admitting: Gastroenterology

## 2020-09-22 ENCOUNTER — Other Ambulatory Visit: Payer: Medicare PPO

## 2020-09-22 ENCOUNTER — Other Ambulatory Visit: Payer: Self-pay

## 2020-09-22 DIAGNOSIS — I129 Hypertensive chronic kidney disease with stage 1 through stage 4 chronic kidney disease, or unspecified chronic kidney disease: Secondary | ICD-10-CM | POA: Diagnosis not present

## 2020-09-22 DIAGNOSIS — R809 Proteinuria, unspecified: Secondary | ICD-10-CM | POA: Diagnosis not present

## 2020-09-22 DIAGNOSIS — N1832 Chronic kidney disease, stage 3b: Secondary | ICD-10-CM | POA: Diagnosis not present

## 2020-10-01 ENCOUNTER — Other Ambulatory Visit: Payer: Self-pay | Admitting: Family Medicine

## 2020-10-01 DIAGNOSIS — R296 Repeated falls: Secondary | ICD-10-CM

## 2020-10-01 DIAGNOSIS — W19XXXA Unspecified fall, initial encounter: Secondary | ICD-10-CM | POA: Diagnosis not present

## 2020-10-01 DIAGNOSIS — R809 Proteinuria, unspecified: Secondary | ICD-10-CM | POA: Diagnosis not present

## 2020-10-01 DIAGNOSIS — N1832 Chronic kidney disease, stage 3b: Secondary | ICD-10-CM | POA: Diagnosis not present

## 2020-10-01 DIAGNOSIS — I5032 Chronic diastolic (congestive) heart failure: Secondary | ICD-10-CM | POA: Diagnosis not present

## 2020-10-01 DIAGNOSIS — I129 Hypertensive chronic kidney disease with stage 1 through stage 4 chronic kidney disease, or unspecified chronic kidney disease: Secondary | ICD-10-CM | POA: Diagnosis not present

## 2020-10-01 NOTE — Progress Notes (Signed)
Dr. Theador Hawthorne called today with concerns about patient's frequent falls.  He is asking for referral to physical therapy to aid with this.  Glad to place.  Orders Placed This Encounter  Procedures  . Ambulatory referral to Physical Therapy    Referral Priority:   Routine    Referral Type:   Physical Medicine    Referral Reason:   Specialty Services Required    Requested Specialty:   Physical Therapy    Number of Visits Requested:   1  . Ambulatory referral to Occupational Therapy    Referral Priority:   Routine    Referral Type:   Occupational Therapy    Referral Reason:   Specialty Services Required    Requested Specialty:   Occupational Therapy    Number of Visits Requested:   1

## 2020-10-05 ENCOUNTER — Encounter: Payer: Self-pay | Admitting: Internal Medicine

## 2020-10-07 ENCOUNTER — Emergency Department (HOSPITAL_COMMUNITY)
Admission: EM | Admit: 2020-10-07 | Discharge: 2020-10-07 | Disposition: A | Discharge status: Home / Self Care | Payer: Medicare PPO | Attending: Emergency Medicine | Admitting: Emergency Medicine

## 2020-10-07 ENCOUNTER — Emergency Department (HOSPITAL_COMMUNITY): Payer: Medicare PPO

## 2020-10-07 ENCOUNTER — Encounter (HOSPITAL_COMMUNITY): Payer: Self-pay

## 2020-10-07 ENCOUNTER — Other Ambulatory Visit: Payer: Self-pay

## 2020-10-07 ENCOUNTER — Ambulatory Visit: Payer: Medicare PPO | Attending: Family Medicine | Admitting: Physical Therapy

## 2020-10-07 DIAGNOSIS — Z7982 Long term (current) use of aspirin: Secondary | ICD-10-CM | POA: Diagnosis not present

## 2020-10-07 DIAGNOSIS — I129 Hypertensive chronic kidney disease with stage 1 through stage 4 chronic kidney disease, or unspecified chronic kidney disease: Secondary | ICD-10-CM | POA: Diagnosis not present

## 2020-10-07 DIAGNOSIS — Z79899 Other long term (current) drug therapy: Secondary | ICD-10-CM | POA: Insufficient documentation

## 2020-10-07 DIAGNOSIS — M79672 Pain in left foot: Secondary | ICD-10-CM | POA: Diagnosis not present

## 2020-10-07 DIAGNOSIS — Z87891 Personal history of nicotine dependence: Secondary | ICD-10-CM | POA: Diagnosis not present

## 2020-10-07 DIAGNOSIS — S82832A Other fracture of upper and lower end of left fibula, initial encounter for closed fracture: Secondary | ICD-10-CM | POA: Insufficient documentation

## 2020-10-07 DIAGNOSIS — I2510 Atherosclerotic heart disease of native coronary artery without angina pectoris: Secondary | ICD-10-CM | POA: Diagnosis not present

## 2020-10-07 DIAGNOSIS — N183 Chronic kidney disease, stage 3 unspecified: Secondary | ICD-10-CM | POA: Diagnosis not present

## 2020-10-07 DIAGNOSIS — Y92007 Garden or yard of unspecified non-institutional (private) residence as the place of occurrence of the external cause: Secondary | ICD-10-CM | POA: Diagnosis not present

## 2020-10-07 DIAGNOSIS — S60042A Contusion of left ring finger without damage to nail, initial encounter: Secondary | ICD-10-CM | POA: Diagnosis not present

## 2020-10-07 DIAGNOSIS — W19XXXA Unspecified fall, initial encounter: Secondary | ICD-10-CM | POA: Diagnosis not present

## 2020-10-07 DIAGNOSIS — S99912A Unspecified injury of left ankle, initial encounter: Secondary | ICD-10-CM | POA: Diagnosis present

## 2020-10-07 DIAGNOSIS — M7989 Other specified soft tissue disorders: Secondary | ICD-10-CM | POA: Diagnosis not present

## 2020-10-07 DIAGNOSIS — M25572 Pain in left ankle and joints of left foot: Secondary | ICD-10-CM | POA: Diagnosis not present

## 2020-10-07 DIAGNOSIS — S82892A Other fracture of left lower leg, initial encounter for closed fracture: Secondary | ICD-10-CM

## 2020-10-07 NOTE — ED Provider Notes (Signed)
[facility] EMERGENCY DEPARTMENT Provider Note   CSN: [id number] Arrival date & time: 10/07/20  1809     History Chief Complaint  Patient presents with  . Ankle Pain    Amanda [name] is a 87 y.o. female.  HPI   Patient presents to the ED for evaluation of ankle pain.  Patient states she was planting flowers when she stumbled and twisted her left ankle.  Patient developed swelling and bruising.  She has been able to bear weight but it hurts.  Patient denies any head injury.  No loss of consciousness.  No difficulty breathing.  Past Medical History:  Diagnosis Date  . Allergy   . Anxiety   . Coronary artery disease    LAD 30% followed by 60% stenosis; pressure wire measurement demonstrated no significant gradient; circumflex had 30% stenosis, right coronary artery has 40% stenosed; EF 75-80%  . Diverticulosis   . GERD (gastroesophageal reflux disease)   . Hematuria   . Hyperlipidemia   . Hypertension   . Impaired renal function 04/23/2018  . Stroke [facility]) 2003   With a left MCA occlusion  . TIA (transient ischemic attack)     Patient Active Problem List   Diagnosis Date Noted  . Dysphagia 07/28/2020  . Abdominal pain 07/28/2020  . Recurrent UTI 03/19/2020  . Leg pain, posterior, right 10/08/2019  . OAB (overactive bladder) 09/18/2019  . Chronic cystitis with hematuria 06/19/2019  . Hematochezia 02/13/2019  . Melena 02/13/2019  . Anemia 02/13/2019  . Constipation 02/13/2019  . Absolute anemia 01/31/2019  . Disorder of phosphorus metabolism 01/31/2019  . CKD (chronic kidney disease) stage 3, GFR 30-59 ml/min (HCC) 07/20/2018  . Postural dizziness 04/23/2018  . Osteoporosis 01/15/2018  . Transient cerebral ischemia 04/10/2013  . Hyperlipidemia 09/22/2008  . Essential hypertension 09/22/2008  . Coronary atherosclerosis 09/22/2008  . History of CVA (cerebrovascular accident) 09/22/2008  . GERD 09/22/2008  . Diverticulosis of colon 09/22/2008    Past Surgical  History:  Procedure Laterality Date  . ABDOMINAL HYSTERECTOMY     partial and complete - x  surgeries  . APPENDECTOMY    . CHOLECYSTECTOMY    . COLONOSCOPY N/A 04/19/2019   TI normal, four sessile polyps, 2-6 mm in size, multiple small and large-mouthed diverticula in entire colon, external and internal hemorrhoids, (tubular adenoma, sessile serrated polyp without dysplasia)  . ESOPHAGOGASTRODUODENOSCOPY N/A 04/19/2019   mild schatzki ring s/p dilation, multiple gastric polyps (fundic), non-bleeding duodenal diverticulum, benign small bowel, no villous, no dysplasia)  . EYE SURGERY Bilateral    cataracts  . Hysterectomy-type unspecified    . POLYPECTOMY  04/19/2019   Procedure: POLYPECTOMY;  Surgeon: Danie Binder, MD;  Location: [facility] ENDO SUITE;  Service: Endoscopy;;  colon   . TONSILLECTOMY       OB History   No obstetric history on file.     Family History  Problem Relation Age of Onset  . Colon cancer Mother 23       dx on colonoscopy   . Cancer Mother        colon  . Cancer Father        unsure if cancer  --- tumor on brain  . Heart attack Sister   . Heart attack Brother   . Diabetes Brother   . GI problems Son   . COPD Sister   . Hypertension Brother   . Heart disease Brother   . Heart attack Brother   . Heart disease Brother   .  Alcohol abuse Brother     Social History   Tobacco Use  . Smoking status: Former Smoker    Packs/day: 0.25    Years: 3.00    Pack years: 0.75    Quit date: 06/06/1985    Years since quitting: 35.3  . Smokeless tobacco: Never Used  . Tobacco comment: Approximately 10-pack-year history  Vaping Use  . Vaping Use: Never used  Substance Use Topics  . Alcohol use: No  . Drug use: No    Home Medications Prior to Admission medications   Medication Sig Start Date End Date Taking? Authorizing Provider  aspirin EC 81 MG tablet Take 81 mg by mouth daily.     [provider]  atorvastatin (LIPITOR) 20 MG tablet TAKE 1 TABLET  AT BEDTIME 08/27/20   Ronnie Doss M, DO  Cholecalciferol 25 MCG (1000 UT) capsule Take 1,000 Units by mouth daily. 01/22/20 01/21/21  [provider]  Docusate Sodium (DSS) 100 MG CAPS Take 1 capsule by mouth as needed.    [provider]  iron polysaccharides (NIFEREX) 150 MG capsule Take 150 mg by mouth daily.    [provider]  loratadine (CLARITIN) 10 MG tablet TAKE 1 TABLET EVERY DAY 06/26/20   Ronnie Doss M, DO  Multiple Vitamins-Minerals (PRESERVISION AREDS 2) CAPS Take 1 capsule by mouth 2 (two) times a day. 11/06/18   [name], DO  omeprazole (PRILOSEC) 20 MG capsule TAKE 1 CAPSULE (20 MG TOTAL) BY MOUTH 2 (TWO) TIMES DAILY BEFORE A MEAL. 03/26/20   Annitta Needs, NP  solifenacin (VESICARE) 5 MG tablet Take 1 tablet (5 mg total) by mouth daily. 03/19/20   McKenzie, Candee Furbish, MD  telmisartan (MICARDIS) 80 MG tablet Take 1 tablet (80 mg total) by mouth daily. (NEEDS TO BE SEEN BEFORE NEXT REFILL) 09/09/20   [name], DO  trimethoprim (TRIMPEX) 100 MG tablet Take 1 tablet (100 mg total) by mouth at bedtime. 03/19/20   McKenzie, Candee Furbish, MD  verapamil (CALAN-SR) 180 MG CR tablet TAKE 1 TABLET AT BEDTIME (REPLACES 120MG ). 06/26/20   Ronnie Doss M, DO    Allergies    Livalo [pitavastatin], Simvastatin, and Lisinopril  Review of Systems   Review of Systems  All other systems reviewed and are negative.   Physical Exam Updated Vital Signs BP 134/89 (BP Location: Right Arm)   Pulse 84   Temp 98.1 F (36.7 C) (Oral)   Resp 20   Ht 1.524 m (5')   Wt 59 kg   SpO2 93%   BMI 25.39 kg/m   Physical Exam Vitals and nursing note reviewed.  Constitutional:      General: She is not in acute distress.    Appearance: She is well-developed.  HENT:     Head: Normocephalic and atraumatic.     Right Ear: External ear normal.     Left Ear: External ear normal.  Eyes:     General: No scleral icterus.       Right eye: No discharge.         Left eye: No discharge.     Conjunctiva/sclera: Conjunctivae normal.  Neck:     Trachea: No tracheal deviation.  Cardiovascular:     Rate and Rhythm: Normal rate.  Pulmonary:     Effort: Pulmonary effort is normal. No respiratory distress.     Breath sounds: No stridor.  Abdominal:     General: There is no distension.  Musculoskeletal:  General: Swelling and tenderness present. No deformity.     Cervical back: Neck supple.     Comments: Ecchymoses and tenderness around the lateral malleolus as well as the lateral aspect of the proximal left foot, mild tenderness to palpation distal aspect of the tibia, no proximal fibular tenderness  Small bruise noted to the ring finger the left hand, no tenderness or deformity  Skin:    General: Skin is warm and dry.     Findings: No rash.     Comments: No skin breakdown noted around her left ankle or foot  Neurological:     Mental Status: She is alert.     Cranial Nerves: Cranial nerve deficit: no gross deficits.     ED Results / Procedures / Treatments   Labs (all labs ordered are listed, but only abnormal results are displayed) Labs Reviewed - No data to display  EKG None  Radiology DG Tibia/Fibula Left  Result Date: 10/07/2020 CLINICAL DATA:  Fall with pain EXAM: LEFT TIBIA AND FIBULA - 2 VIEW COMPARISON:  None. FINDINGS: Proximal fibula and tibia appear intact. Acute minimally displaced distal fibular fracture. Possible fracture lucency at the posterior malleolus. Positive for mid to distal soft tissue swelling. IMPRESSION: 1. Acute distal fibular fracture. 2. Possible nondisplaced posterior malleolar fracture Electronically Signed   By: Donavan Foil M.D.   On: 10/07/2020 21:14   DG Ankle Complete Left  Result Date: 10/07/2020 CLINICAL DATA:  [place] EXAM: LEFT ANKLE COMPLETE - 3+ VIEW COMPARISON:  None. FINDINGS: Acute nondisplaced distal fibular fracture best seen on lateral view. Possible nondisplaced fracture  lucency at the posterior malleolus. Ankle mortise is symmetric. Prominent soft tissue swelling. IMPRESSION: 1. Acute nondisplaced distal fibular fracture. 2. Possible nondisplaced posterior malleolar fracture Electronically Signed   By: Donavan Foil M.D.   On: 10/07/2020 21:11   DG Foot Complete Left  Result Date: 10/07/2020 CLINICAL DATA:  Fall with pain EXAM: LEFT FOOT - COMPLETE 3+ VIEW COMPARISON:  None. FINDINGS: Acute distal fibular fracture. No fracture or malalignment within the foot. Possible ulcer lateral aspect of the foot at the level of the fifth mid metatarsal. Ulcer or skin indentation along the dorsum of the foot. IMPRESSION: 1. No acute osseous abnormality at the foot. Nondisplaced distal fibular fracture 2. Skin indentation or ulcers along the dorsum and lateral aspect of the foot. Electronically Signed   By: Donavan Foil M.D.   On: 10/07/2020 21:13    Procedures Procedures   Medications Ordered in ED Medications - No data to display  ED Course  I have reviewed the triage vital signs and the nursing notes.  Pertinent labs & imaging results that were available during my care of the patient were reviewed by me and considered in my medical decision making (see chart for details).  Clinical Course as of 10/07/20 2209  Wed Oct 07, 2020  2044 Patient states she is not have any significant tenderness of her hand.  Does not want any x-rays [JK]    Clinical Course User Index [JK] Dorie Rank, MD   MDM Rules/Calculators/A&P                          Patient presented to the ED for evaluation of ankle pain after a fall.  Patient denied any other injuries.  Tenderness.  X-ray does show a nondisplaced distal fibula fracture. Final Clinical Impression(s) / ED Diagnoses Final diagnoses:  Closed fracture of left ankle,  initial encounter    Rx / DC Orders ED Discharge Orders    None       Dorie Rank, MD 10/07/20 2210

## 2020-10-07 NOTE — ED Triage Notes (Signed)
Pt to er, pt states that she was planting flowers and stumbled and now she has some swelling to he L ankle.  Pt states that it hurts to stand on it.  Pt states that she hurt her ankle this am.

## 2020-10-07 NOTE — Discharge Instructions (Signed)
Wear the cam walker boot to keep your ankle stabilized.  Use your walker to help get around.  Try to keep your leg elevated when not moving to help reduce swelling.  Call Dr. Gerrit Heck office to schedule an appointment

## 2020-10-13 ENCOUNTER — Other Ambulatory Visit: Payer: Self-pay

## 2020-10-13 ENCOUNTER — Ambulatory Visit: Payer: Medicare PPO

## 2020-10-13 ENCOUNTER — Ambulatory Visit (INDEPENDENT_AMBULATORY_CARE_PROVIDER_SITE_OTHER): Payer: Medicare PPO | Admitting: Orthopedic Surgery

## 2020-10-13 ENCOUNTER — Encounter: Payer: Self-pay | Admitting: Orthopedic Surgery

## 2020-10-13 VITALS — BP 112/64 | HR 71 | Ht 60.0 in | Wt 130.0 lb

## 2020-10-13 DIAGNOSIS — S82832A Other fracture of upper and lower end of left fibula, initial encounter for closed fracture: Secondary | ICD-10-CM | POA: Diagnosis not present

## 2020-10-13 DIAGNOSIS — S82892A Other fracture of left lower leg, initial encounter for closed fracture: Secondary | ICD-10-CM | POA: Diagnosis not present

## 2020-10-13 DIAGNOSIS — M25572 Pain in left ankle and joints of left foot: Secondary | ICD-10-CM

## 2020-10-13 DIAGNOSIS — W19XXXA Unspecified fall, initial encounter: Secondary | ICD-10-CM

## 2020-10-13 NOTE — Patient Instructions (Signed)

## 2020-10-13 NOTE — Progress Notes (Signed)
New Patient Visit  Assessment: Amanda [name] is a 87 y.o. female with the following: Fracture of the left distal fibula  Plan: Patient is comfortable in clinic today.  She does have obvious swelling and ecchymosis about the left lower leg.  Reviewed radiographs from the emergency department which demonstrates an oblique, minimally displaced fracture of the distal fibula.  Repeat radiographs today demonstrates that there has been no interval worsening.  However, based on my review of the injury x-rays, I am concerned for some lateral translation of the talus, indicative of an unstable injury.  The patient has been walking in a walking boot since the injury, and I do not want this to displace further.  As a result, I recommended a period of immobilization with a cast.  This should allow things to start to heal in an excellent position.  This was explained to the patient, and she agreed.  We will see her back in 2 weeks, repeat the x-rays and possibly transition her back to a walking boot.  Cast application - left short leg cast   Verbal consent was obtained and the correct extremity was identified. A well padded, appropriately molded short leg cast was applied to the left arm Toes remained warm and well perfused.   There were no sharp edges Patient tolerated the procedure well Cast care instructions were provided    Follow-up: Return in about 2 weeks (around 10/27/2020).  Subjective:  Chief Complaint  Patient presents with  . Ankle Injury    Left Ankle fracture, went to ER 10/07/20 had xrays states not painful today     History of Present Illness: Amanda [name] is a 87 y.o. female who presents to clinic for evaluation of left ankle pain.  She states she was planting flowers approximately 1 week ago, when she fell.  She noted immediate pain in her left ankle.  She presented to the emergency department for evaluation.  She was noted to have a minimally displaced fracture of the distal left fibula,  and was placed into a cam walker boot.  Since then, her pain is improved.  She states she has been putting some weight on her left leg.  She denies numbness and tingling.  She has been using a cane and a walker to assist with ambulation.   Review of Systems: No fevers or chills No numbness or tingling No chest pain No shortness of breath No bowel or bladder dysfunction No GI distress No headaches   Medical History:  Past Medical History:  Diagnosis Date  . Allergy   . Anxiety   . Coronary artery disease    LAD 30% followed by 60% stenosis; pressure wire measurement demonstrated no significant gradient; circumflex had 30% stenosis, right coronary artery has 40% stenosed; EF 75-80%  . Diverticulosis   . GERD (gastroesophageal reflux disease)   . Hematuria   . Hyperlipidemia   . Hypertension   . Impaired renal function 04/23/2018  . Stroke [facility]) 2003   With a left MCA occlusion  . TIA (transient ischemic attack)     Past Surgical History:  Procedure Laterality Date  . ABDOMINAL HYSTERECTOMY     partial and complete - x  surgeries  . APPENDECTOMY    . CHOLECYSTECTOMY    . COLONOSCOPY N/A 04/19/2019   TI normal, four sessile polyps, 2-6 mm in size, multiple small and large-mouthed diverticula in entire colon, external and internal hemorrhoids, (tubular adenoma, sessile serrated polyp without dysplasia)  . ESOPHAGOGASTRODUODENOSCOPY N/A  04/19/2019   mild schatzki ring s/p dilation, multiple gastric polyps (fundic), non-bleeding duodenal diverticulum, benign small bowel, no villous, no dysplasia)  . EYE SURGERY Bilateral    cataracts  . Hysterectomy-type unspecified    . POLYPECTOMY  04/19/2019   Procedure: POLYPECTOMY;  Surgeon: Danie Binder, MD;  Location: [facility] ENDO SUITE;  Service: Endoscopy;;  colon   . TONSILLECTOMY      Family History  Problem Relation Age of Onset  . Colon cancer Mother 34       dx on colonoscopy   . Cancer Mother        colon  . Cancer  Father        unsure if cancer  --- tumor on brain  . Heart attack Sister   . Heart attack Brother   . Diabetes Brother   . GI problems Son   . COPD Sister   . Hypertension Brother   . Heart disease Brother   . Heart attack Brother   . Heart disease Brother   . Alcohol abuse Brother    Social History   Tobacco Use  . Smoking status: Former Smoker    Packs/day: 0.25    Years: 3.00    Pack years: 0.75    Quit date: 06/06/1985    Years since quitting: 35.3  . Smokeless tobacco: Never Used  . Tobacco comment: Approximately 10-pack-year history  Vaping Use  . Vaping Use: Never used  Substance Use Topics  . Alcohol use: No  . Drug use: No    Allergies  Allergen Reactions  . Livalo [Pitavastatin] Other (See Comments)    Causes dizziness  . Simvastatin Other (See Comments)    Causes dizziness  . Lisinopril     Hallucinations, resolved on ARB    No outpatient medications have been marked as taking for the 10/13/20 encounter (Office Visit) with Mordecai Rasmussen, MD.    Objective: BP 112/64   Pulse 71   Ht 5' (1.524 m)   Wt 130 lb (59 kg)   BMI 25.39 kg/m   Physical Exam:  General: Alert and oriented.  No acute distress.  Seated in a wheelchair. Gait: Ambulates with an assistive device.   Evaluation of left ankle demonstrates diffuse swelling and ecchymosis.  She is tenderness to palpation over the distal fibula.  Minimal tenderness over the medial ankle.  Active motion intact to the TA/EHL.  Toes are warm and well-perfused.  Sensation is intact on the dorsum, as well as the plantar aspect of her foot.   IMAGING: I personally ordered and reviewed the following images  X-rays of the left ankle were obtained in clinic today and compared to previous x-rays.  Particularly on the mortise view, there appears to be a small amount of lateral displacement of the fracture.  Overall alignment is very good.  No syndesmotic disruption.  No injury to the posterior  malleolus.  Impression: Minimally displaced left distal fibula fracture.  New Medications:  No orders of the defined types were placed in this encounter.     Mordecai Rasmussen, MD  10/13/2020 3:24 PM

## 2020-10-23 ENCOUNTER — Ambulatory Visit: Payer: Medicare PPO | Admitting: Family Medicine

## 2020-10-23 ENCOUNTER — Other Ambulatory Visit: Payer: Self-pay

## 2020-10-23 ENCOUNTER — Encounter: Payer: Self-pay | Admitting: Family Medicine

## 2020-10-23 VITALS — BP 90/57 | HR 94 | Temp 98.2°F | Ht 60.0 in | Wt 140.0 lb

## 2020-10-23 DIAGNOSIS — N1832 Chronic kidney disease, stage 3b: Secondary | ICD-10-CM

## 2020-10-23 DIAGNOSIS — R296 Repeated falls: Secondary | ICD-10-CM | POA: Diagnosis not present

## 2020-10-23 DIAGNOSIS — I9589 Other hypotension: Secondary | ICD-10-CM | POA: Diagnosis not present

## 2020-10-23 NOTE — Patient Instructions (Signed)
Once your orthopedist releases you to do physical therapy, I'd like to get someone to come to your home to help you with balance and strengthening  STAY OFF the Telmisartan for now.  Get your specialist to send me your blood pressure reading next week. I think your blood pressure is too low and this may be causing your dizziness.

## 2020-10-23 NOTE — Progress Notes (Signed)
Subjective: CC: Follow-up ankle fracture, hypertension, CKD PCP: Amanda Norlander, DO ZJI:RCVE Amanda [name] is a 87 y.o. female presenting to clinic today for:  1.  Hypertension with CKD Patient is closely followed by Dr. Theador Hawthorne.  At last visit with him her levels were stable.  She had mild hypophosphatemia.  She is treated with Micardis and verapamil.  She does report having felt dizzy prior to fracturing her left ankle.  In fact, her nephrologist had reached out to me to order physical therapy prior to her fracture.  She is not engaged in physical therapy as of yet due to need for cast.  She will be seeing her orthopedist next week for recheck.   ROS: Per HPI  Allergies  Allergen Reactions  . Livalo [Pitavastatin] Other (See Comments)    Causes dizziness  . Simvastatin Other (See Comments)    Causes dizziness  . Lisinopril     Hallucinations, resolved on ARB   Past Medical History:  Diagnosis Date  . Allergy   . Anxiety   . Coronary artery disease    LAD 30% followed by 60% stenosis; pressure wire measurement demonstrated no significant gradient; circumflex had 30% stenosis, right coronary artery has 40% stenosed; EF 75-80%  . Diverticulosis   . GERD (gastroesophageal reflux disease)   . Hematuria   . Hyperlipidemia   . Hypertension   . Impaired renal function 04/23/2018  . Stroke [facility]) 2003   With a left MCA occlusion  . TIA (transient ischemic attack)     Current Outpatient Medications:  .  aspirin EC 81 MG tablet, Take 81 mg by mouth daily. , Disp: , Rfl:  .  atorvastatin (LIPITOR) 20 MG tablet, TAKE 1 TABLET AT BEDTIME, Disp: 90 tablet, Rfl: 0 .  Cholecalciferol 25 MCG (1000 UT) capsule, Take 1,000 Units by mouth daily., Disp: , Rfl:  .  Docusate Sodium (DSS) 100 MG CAPS, Take 1 capsule by mouth as needed., Disp: , Rfl:  .  iron polysaccharides (NIFEREX) 150 MG capsule, Take 150 mg by mouth daily., Disp: , Rfl:  .  loratadine (CLARITIN) 10 MG tablet, TAKE 1 TABLET  EVERY DAY, Disp: 90 tablet, Rfl: 1 .  losartan (COZAAR) 50 MG tablet, Take by mouth., Disp: , Rfl:  .  Multiple Vitamins-Minerals (PRESERVISION AREDS 2) CAPS, Take 1 capsule by mouth 2 (two) times a day., Disp: 180 capsule, Rfl: 2 .  omeprazole (PRILOSEC) 20 MG capsule, TAKE 1 CAPSULE (20 MG TOTAL) BY MOUTH 2 (TWO) TIMES DAILY BEFORE A MEAL., Disp: 180 capsule, Rfl: 3 .  solifenacin (VESICARE) 5 MG tablet, Take 1 tablet (5 mg total) by mouth daily., Disp: 90 tablet, Rfl: 3 .  telmisartan (MICARDIS) 80 MG tablet, Take 1 tablet (80 mg total) by mouth daily. (NEEDS TO BE SEEN BEFORE NEXT REFILL), Disp: 90 tablet, Rfl: 0 .  trimethoprim (TRIMPEX) 100 MG tablet, Take 1 tablet (100 mg total) by mouth at bedtime., Disp: 90 tablet, Rfl: 3 .  verapamil (CALAN-SR) 180 MG CR tablet, TAKE 1 TABLET AT BEDTIME (REPLACES 120MG )., Disp: 90 tablet, Rfl: 1 .  VITAMIN D PO, 1,000 Int'l Units/day., Disp: , Rfl:  Social History   Socioeconomic History  . Marital status: Widowed    Spouse name: Not on file  . Number of children: 2  . Years of education: 1  . Highest education level: High school graduate  Occupational History  . Occupation: Horticulturist, commercial: RETIRED    Comment: Retired  Tobacco Use  . Smoking status: Former Smoker    Packs/day: 0.25    Years: 3.00    Pack years: 0.75    Quit date: 06/06/1985    Years since quitting: 35.4  . Smokeless tobacco: Never Used  . Tobacco comment: Approximately 10-pack-year history  Vaping Use  . Vaping Use: Never used  Substance and Sexual Activity  . Alcohol use: No  . Drug use: No  . Sexual activity: Not Currently  Other Topics Concern  . Not on file  Social History Narrative   Lives in [place] with her son.   Has been widowed for about 3 years.   Social Determinants of Health   Financial Resource Strain: Not on file  Food Insecurity: Not on file  Transportation Needs: Not on file  Physical Activity: Not on file  Stress: Not on file  Social  Connections: Not on file  Intimate Partner Violence: Not on file   Family History  Problem Relation Age of Onset  . Colon cancer Mother 44       dx on colonoscopy   . Cancer Mother        colon  . Cancer Father        unsure if cancer  --- tumor on brain  . Heart attack Sister   . Heart attack Brother   . Diabetes Brother   . GI problems Son   . COPD Sister   . Hypertension Brother   . Heart disease Brother   . Heart attack Brother   . Heart disease Brother   . Alcohol abuse Brother     Objective: Office vital signs reviewed. BP (!) 90/57   Pulse 94   Temp 98.2 F (36.8 C)   Ht 5' (1.524 m)   Wt 140 lb (63.5 kg)   SpO2 96%   BMI 27.34 kg/m   Physical Examination:  General: Awake, alert, well appearing elderly female. No acute distress Cardio: regular rate  Pulm: Normal work of breathing on room air MSK: Arrives in wheelchair.  Left lower extremity is casted.  Light touch sensation grossly intact to the digits of the left foot.  Blood flow appears to be intact  Assessment/ Plan: 87 y.o. female   Stage 3b chronic kidney disease ([place]) - Plan: Renal Function Panel, CBC  Frequent falls  Other specified hypotension  Blood pressure is too low.  I am having her hold her Micardis.  Okay to continue verapamil.  She will be seeing her orthopedist on Tuesday and I would like her blood pressure checked at that visit.  I would also like her to get a home blood pressure cuff.  Make sure she is hydrating adequately.  Renal function panel and CBC were obtained and we will CC these to her nephrologist.  May consider discontinuing my card is totally pending blood pressure outcomes.  She does suffer from renal disease so alternatively could consider reduction versus discontinuation of verapamil if needed.  I worry about orthostasis and this patient with frequent falls.  Would like her to start seeing physical therapy once cleared by her orthopedist.  Will CC chart to her  specialist.  No orders of the defined types were placed in this encounter.  No orders of the defined types were placed in this encounter.    Amanda Norlander, DO [place] [phone]

## 2020-10-24 LAB — RENAL FUNCTION PANEL
Albumin: 4.6 g/dL (ref 3.6–4.6)
BUN/Creatinine Ratio: 14 (ref 12–28)
BUN: 30 mg/dL — ABNORMAL HIGH (ref 8–27)
CO2: 21 mmol/L (ref 20–29)
Calcium: 11.8 mg/dL — ABNORMAL HIGH (ref 8.7–10.3)
Chloride: 105 mmol/L (ref 96–106)
Creatinine, Ser: 2.07 mg/dL — ABNORMAL HIGH (ref 0.57–1.00)
Glucose: 96 mg/dL (ref 65–99)
Phosphorus: 2.7 mg/dL — ABNORMAL LOW (ref 3.0–4.3)
Potassium: 5.3 mmol/L — ABNORMAL HIGH (ref 3.5–5.2)
Sodium: 141 mmol/L (ref 134–144)
eGFR: 23 mL/min/{1.73_m2} — ABNORMAL LOW (ref >59)

## 2020-10-24 LAB — CBC
Hematocrit: 36.6 % (ref 34.0–46.6)
Hemoglobin: 12.4 g/dL (ref 11.1–15.9)
MCH: 32 pg (ref 26.6–33.0)
MCHC: 33.9 g/dL (ref 31.5–35.7)
MCV: 95 fL (ref 79–97)
Platelets: 210 10*3/uL (ref 150–450)
RBC: 3.87 x10E6/uL (ref 3.77–5.28)
RDW: 12.1 % (ref 11.7–15.4)
WBC: 7.5 10*3/uL (ref 3.4–10.8)

## 2020-10-26 ENCOUNTER — Other Ambulatory Visit: Payer: Self-pay | Admitting: Family Medicine

## 2020-10-26 ENCOUNTER — Other Ambulatory Visit: Payer: Self-pay | Admitting: *Deleted

## 2020-10-26 DIAGNOSIS — N1832 Chronic kidney disease, stage 3b: Secondary | ICD-10-CM

## 2020-10-27 ENCOUNTER — Ambulatory Visit (INDEPENDENT_AMBULATORY_CARE_PROVIDER_SITE_OTHER): Payer: Medicare PPO | Admitting: Orthopedic Surgery

## 2020-10-27 ENCOUNTER — Encounter: Payer: Self-pay | Admitting: Orthopedic Surgery

## 2020-10-27 ENCOUNTER — Ambulatory Visit: Payer: Medicare PPO | Admitting: Nurse Practitioner

## 2020-10-27 ENCOUNTER — Ambulatory Visit: Payer: Medicare PPO

## 2020-10-27 ENCOUNTER — Other Ambulatory Visit: Payer: Self-pay

## 2020-10-27 VITALS — BP 134/84 | HR 96 | Ht 60.0 in | Wt 130.8 lb

## 2020-10-27 DIAGNOSIS — M25572 Pain in left ankle and joints of left foot: Secondary | ICD-10-CM

## 2020-10-27 DIAGNOSIS — S82832D Other fracture of upper and lower end of left fibula, subsequent encounter for closed fracture with routine healing: Secondary | ICD-10-CM | POA: Diagnosis not present

## 2020-10-27 NOTE — Progress Notes (Signed)
Orthopaedic Clinic Return  Assessment: Amanda [name] is a 87 y.o. female with the following: Left distal fibula fracture  Plan: Repeat radiographs today demonstrates that the fracture remains stable.  There does not appear to be any instability in the ankle joint.  No interval displacement of the fracture.  Cast was removed today, and she was transition to a walking boot.  I have advised her to continue with limited weightbearing until she is seen in clinic in 3 weeks.  All questions were answered, and she is amenable to this plan.  Continue to ambulate with the assistance of a walker.  Follow-up: Return in about 3 weeks (around 11/17/2020).   Subjective:  Chief Complaint  Patient presents with  . Follow-up    L ankle/xray out of cast    History of Present Illness: Amanda [name] is a 87 y.o. female who returns to clinic today for repeat evaluation of her left ankle.  Briefly, she sustained an injury to her left ankle on May 4.  She presented to clinic approximately 1 week later, at which time she was placed in a cast.  She has done well since we last saw her.  She has been bearing some weight through the left ankle.  She has had no pain in her left ankle.  She continues to walk with the assistance of a walker.    Review of Systems: No fevers or chills No numbness or tingling No chest pain No shortness of breath No bowel or bladder dysfunction No GI distress No headaches    Objective: BP 134/84   Pulse 96   Ht 5' (1.524 m)   Wt 130 lb 12.8 oz (59.3 kg)   BMI 25.55 kg/m   Physical Exam:  Elderly female.  No acute distress.  Walks with the assistance of a walker.  No weightbearing in the left foot.  Evaluation of left ankle demonstrates persistent swelling over the lateral aspect of her ankle.  No tenderness to palpation medially.  She continues to have some tenderness to palpation over the distal fibula on the lateral aspect of her ankle.  Toes are warm and well-perfused.   Intact sensation over the dorsum of her foot.  Active motion in the TA/EHL intact.  2+ DP pulse.  IMAGING: I personally ordered and reviewed the following images:   X-rays of the left ankle were obtained in clinic today and demonstrates a distal fibula fracture, without interval displacement compared to previous x-rays.  No obvious callus formation.  The mortise remains intact.  No syndesmotic disruption.  No acute injuries.  Impression: Left distal fibula fracture in acceptable alignment.   Mordecai Rasmussen, MD 10/27/2020 11:16 AM

## 2020-10-27 NOTE — Patient Instructions (Addendum)
Wear boot at all times for the next 3 weeks  No weightbearing on your left leg until follow up  Elevate the foot to help with swelling.

## 2020-10-28 ENCOUNTER — Other Ambulatory Visit: Payer: Self-pay | Admitting: Family Medicine

## 2020-10-29 ENCOUNTER — Other Ambulatory Visit: Payer: Self-pay

## 2020-10-29 ENCOUNTER — Other Ambulatory Visit: Payer: Medicare PPO

## 2020-10-29 DIAGNOSIS — N1832 Chronic kidney disease, stage 3b: Secondary | ICD-10-CM

## 2020-10-29 DIAGNOSIS — I129 Hypertensive chronic kidney disease with stage 1 through stage 4 chronic kidney disease, or unspecified chronic kidney disease: Secondary | ICD-10-CM | POA: Diagnosis not present

## 2020-10-29 DIAGNOSIS — R809 Proteinuria, unspecified: Secondary | ICD-10-CM | POA: Diagnosis not present

## 2020-10-29 DIAGNOSIS — I5032 Chronic diastolic (congestive) heart failure: Secondary | ICD-10-CM | POA: Diagnosis not present

## 2020-10-30 ENCOUNTER — Ambulatory Visit: Payer: Medicare PPO | Admitting: Gastroenterology

## 2020-10-31 ENCOUNTER — Encounter: Payer: Self-pay | Admitting: Family Medicine

## 2020-10-31 ENCOUNTER — Other Ambulatory Visit: Payer: Self-pay | Admitting: Family Medicine

## 2020-10-31 DIAGNOSIS — I10 Essential (primary) hypertension: Secondary | ICD-10-CM

## 2020-11-10 ENCOUNTER — Ambulatory Visit (INDEPENDENT_AMBULATORY_CARE_PROVIDER_SITE_OTHER): Payer: Medicare PPO

## 2020-11-10 VITALS — BP 134/84

## 2020-11-10 DIAGNOSIS — Z0000 Encounter for general adult medical examination without abnormal findings: Secondary | ICD-10-CM

## 2020-11-10 NOTE — Patient Instructions (Signed)
  [facility] Maintenance Summary and Written Plan of Care  Ms. Hemm ,  Thank you for allowing me to perform your Medicare Annual Wellness Visit and for your ongoing commitment to your health.   Health Maintenance & Immunization History Health Maintenance  Topic Date Due  . Pneumococcal Vaccine 75-85 Years old (1 of 4 - PCV13) Never done  . Zoster Vaccines- Shingrix (1 of 2) Never done  . TETANUS/TDAP  10/23/2021 (Originally 04/04/1950)  . INFLUENZA VACCINE  01/04/2021  . DEXA SCAN  Completed  . COVID-19 Vaccine  Completed  . PNA vac Low Risk Adult  Completed  . HPV VACCINES  Aged Out   Immunization History  Administered Date(s) Administered  . Fluad Quad(high Dose 65+) 02/06/2019  . Influenza, High Dose Seasonal PF 04/23/2018  . Moderna Sars-Covid-2 Vaccination 08/20/2019, 09/17/2019, 05/26/2020  . Pneumococcal Conjugate-13 09/14/2017  . Pneumococcal Polysaccharide-23 11/06/2018    These are the patient goals that we discussed: Goals Addressed            This Visit's Progress   . Fall Prevention   Not on track    Current Barriers:  Marland Kitchen Knowledge Deficits related to fall precautions . Decreased adherence to prescribed treatment for fall prevention . Orthostatic hypotension  Nurse Case Manager Clinical Goal(s):  Marland Kitchen Over the next 30 days, patient will verbalize using fall risk reduction strategies discussed . Over the next 90 days, patient will not experience additional falls  Interventions:  . Provided verbal education re: Potential causes of falls and Fall prevention strategies . Reviewed medications and discussed potential side effects of medications such as dizziness and frequent urination . Assessed for s/s of orthostatic hypotension . Assessed for falls since last encounter . Assessed patients knowledge of fall risk prevention secondary to previously provided education. . Encouraged patient to: Marland Kitchen Use cane for ambulation when feeling  unsteady . De-clutter walkways . Change positions slowly . Wear secure fitting shoes at all times with ambulation . Utilize home lighting for dim lit areas . Have self and pet awareness at all times  Patient Self Care Activities:  . Able to perform most ADLs independently and has assistance from son  Please see past updates related to this goal by clicking on the "Past Updates" button in the selected goal      . Increase physical activity   Not on track    Stay active - she would like to find another part time job     . Prevent falls          This is a list of Health Maintenance Items that are overdue or due now: Health Maintenance Due  Topic Date Due  . Pneumococcal Vaccine 107-60 Years old (1 of 4 - PCV13) Never done  . Zoster Vaccines- Shingrix (1 of 2) Never done     Orders/Referrals Placed Today: No orders of the defined types were placed in this encounter.  (Contact our referral department at [phone] if you have not spoken with someone about your referral appointment within the next 5 days)    Follow-up Plan  Scheduled with Dr. Lajuana Ripple 04/26/21 at 10:00am.

## 2020-11-10 NOTE — Progress Notes (Signed)
MEDICARE ANNUAL WELLNESS VISIT  11/10/2020  Telephone Visit Disclaimer This Medicare AWV was conducted by telephone due to national recommendations for restrictions regarding the COVID-19 Pandemic (e.g. social distancing).  I verified, using two identifiers, that I am speaking with Amanda [name] or their authorized healthcare agent. I discussed the limitations, risks, security, and privacy concerns of performing an evaluation and management service by telephone and the potential availability of an in-person appointment in the future. The patient expressed understanding and agreed to proceed.  Location of Patient: Home Location of Provider (nurse):  [facility]  Subjective:    Amanda [name] is a 87 y.o. female patient of Amanda Norlander, DO who had a Medicare Annual Wellness Visit today via telephone. Amanda [name] is Retired and lives with their son. she has two children. she reports that she is socially active and does interact with friends/family regularly. she is not physically active and enjoys word puzzels.  Patient Care Team: Amanda Norlander, DO as PCP - General (Family Medicine) Cathe Mons, MD (Internal Medicine) Harlen Labs, MD as Referring Physician (Optometry) Throckmorton, Delice Bison, RN as Registered Nurse Fields, Marga Melnick, MD (Inactive) as Consulting Physician (Gastroenterology) Eloise Harman, DO as Consulting Physician (Internal Medicine)  Advanced Directives 11/10/2020 10/07/2020 09/10/2019 04/19/2019 08/07/2017  Does Patient Have a Medical Advance Directive? Yes Yes Yes Yes No;Yes  Type of Industrial/product designer of [place];Living will [place];Living will  Does patient want to make changes to medical advance directive? No - Patient declined - No - Patient declined - No - Patient declined  Copy of  [place] in  Chart? - - No - copy requested - No - copy requested  Would patient like information on creating a medical advance directive? - - - - No - Patient declined    Hospital Utilization Over the Past 12 Months: # of hospitalizations or ER visits: 1 # of surgeries: 0  Review of Systems    Patient reports that her overall health is worse compared to last year.  Negative except [place] and has left ankle fracture.  Patient Reported Readings (BP-134/84 ( 2 weeks ago), Pulse, CBG, Weight, etc)   Pain Assessment Pain : 0-10 Pain Score: 3  Pain Type: Acute pain Pain Location: Ankle Pain Orientation: Left Pain Descriptors / Indicators: Discomfort Pain Onset: More than a month ago Pain Frequency: Intermittent     Current Medications & Allergies (verified) Allergies as of 11/10/2020      Reactions   Livalo [pitavastatin] Other (See Comments)   Causes dizziness   Simvastatin Other (See Comments)   Causes dizziness   Lisinopril    Hallucinations, resolved on ARB      Medication List       Accurate as of Tryphena 7, 2022  9:08 AM. If you have any questions, ask your nurse or doctor.        STOP taking these medications   aspirin EC 81 MG tablet     TAKE these medications   atorvastatin 20 MG tablet Commonly known as: LIPITOR TAKE 1 TABLET AT BEDTIME   Cholecalciferol 25 MCG (1000 UT) capsule Take 1,000 Units by mouth daily.   DSS 100 MG Caps Take 1 capsule by mouth as needed.   iron polysaccharides 150 MG capsule Commonly known as: NIFEREX Take 150 mg by mouth daily.   loratadine  10 MG tablet Commonly known as: CLARITIN TAKE 1 TABLET EVERY DAY   omeprazole 20 MG capsule Commonly known as: PRILOSEC TAKE 1 CAPSULE (20 MG TOTAL) BY MOUTH 2 (TWO) TIMES DAILY BEFORE A MEAL.   PreserVision AREDS 2 Caps Take 1 capsule by mouth 2 (two) times a day.   solifenacin 5 MG tablet Commonly known as: VESICARE Take 1 tablet (5 mg total) by mouth daily.   trimethoprim 100 MG  tablet Commonly known as: TRIMPEX Take 1 tablet (100 mg total) by mouth at bedtime.   verapamil 180 MG CR tablet Commonly known as: CALAN-SR TAKE 1 TABLET AT BEDTIME (REPLACES 120MG ).   VITAMIN D PO 1,000 Int'l Units/day.       History (reviewed): Past Medical History:  Diagnosis Date  . Allergy   . Anxiety   . Coronary artery disease    LAD 30% followed by 60% stenosis; pressure wire measurement demonstrated no significant gradient; circumflex had 30% stenosis, right coronary artery has 40% stenosed; EF 75-80%  . Diverticulosis   . GERD (gastroesophageal reflux disease)   . Hematuria   . Hyperlipidemia   . Hypertension   . Impaired renal function 04/23/2018  . Stroke [facility]) 2003   With a left MCA occlusion  . TIA (transient ischemic attack)    Past Surgical History:  Procedure Laterality Date  . ABDOMINAL HYSTERECTOMY     partial and complete - x  surgeries  . APPENDECTOMY    . CHOLECYSTECTOMY    . COLONOSCOPY N/A 04/19/2019   TI normal, four sessile polyps, 2-6 mm in size, multiple small and large-mouthed diverticula in entire colon, external and internal hemorrhoids, (tubular adenoma, sessile serrated polyp without dysplasia)  . ESOPHAGOGASTRODUODENOSCOPY N/A 04/19/2019   mild schatzki ring s/p dilation, multiple gastric polyps (fundic), non-bleeding duodenal diverticulum, benign small bowel, no villous, no dysplasia)  . EYE SURGERY Bilateral    cataracts  . Hysterectomy-type unspecified    . POLYPECTOMY  04/19/2019   Procedure: POLYPECTOMY;  Surgeon: Danie Binder, MD;  Location: [facility];  Service: Endoscopy;;  colon   . TONSILLECTOMY     Family History  Problem Relation Age of Onset  . Colon cancer Mother 27       dx on colonoscopy   . Cancer Mother        colon  . Cancer Father        unsure if cancer  --- tumor on brain  . Heart attack Sister   . Heart attack Brother   . Diabetes Brother   . GI problems Son   . COPD Sister   . Hypertension  Brother   . Heart disease Brother   . Heart attack Brother   . Heart disease Brother   . Alcohol abuse Brother    Social History   Socioeconomic History  . Marital status: Widowed    Spouse name: Not on file  . Number of children: 2  . Years of education: 64  . Highest education level: High school graduate  Occupational History  . Occupation: Horticulturist, commercial: RETIRED    Comment: Retired  Tobacco Use  . Smoking status: Former Smoker    Packs/day: 0.25    Years: 3.00    Pack years: 0.75    Quit date: 06/06/1985    Years since quitting: 35.4  . Smokeless tobacco: Never Used  . Tobacco comment: Approximately 10-pack-year history  Vaping Use  . Vaping Use: Never used  Substance and Sexual  Activity  . Alcohol use: No  . Drug use: No  . Sexual activity: Not Currently  Other Topics Concern  . Not on file  Social History Narrative   Lives in [place] with her son.   Has been widowed for about 3 years.   Social Determinants of Health   Financial Resource Strain: Not on file  Food Insecurity: Not on file  Transportation Needs: Not on file  Physical Activity: Not on file  Stress: Not on file  Social Connections: Not on file    Activities of Daily Living In your present state of health, do you have any difficulty performing the following activities: 11/10/2020  Hearing? N  Vision? N  Difficulty concentrating or making decisions? N  Walking or climbing stairs? Y  Dressing or bathing? N  Doing errands, shopping? Y  Preparing Food and eating ? N  Using the Toilet? N  In the past six months, have you accidently leaked urine? N  Do you have problems with loss of bowel control? N  Managing your Medications? Y  Managing your Finances? Y  Housekeeping or managing your Housekeeping? Y  Some recent data might be hidden    Patient Education/ Literacy How often do you need to have someone help you when you read instructions, pamphlets, or other written materials from your  doctor or pharmacy?: 2 - Rarely What is the last grade level you completed in school?: 12th grade  Exercise Current Exercise Habits: The patient does not participate in regular exercise at present, Exercise limited by: orthopedic condition(s)  Diet Patient reports consuming 2 meals a day and 2 snack(s) a day Patient reports that her primary diet is: Regular Patient reports that she does have regular access to food.   Depression Screen PHQ 2/9 Scores 11/10/2020 10/23/2020 05/04/2020 12/31/2019 10/08/2019 09/10/2019 05/07/2019  PHQ - 2 Score 4 4 1 4  0 0 0  PHQ- 9 Score - 10 4 10  - - 0     Fall Risk Fall Risk  11/10/2020 10/23/2020 05/04/2020 12/31/2019 10/08/2019  Falls in the past year? 1 1 1 1 1   Number falls in past yr: 1 1 1 1  0  Injury with Fall? 1 1 0 0 1  Comment - - - - -  Risk for fall due to : History of fall(s);Mental status change;Impaired balance/gait;Impaired mobility;Medication side effect History of fall(s) History of fall(s);Impaired balance/gait Impaired balance/gait;History of fall(s) History of fall(s)  Follow up Falls evaluation completed - Falls prevention discussed - Falls evaluation completed     Objective:  Amanda [name] seemed alert and oriented and she participated appropriately during our telephone visit.  Blood Pressure Weight BMI  BP Readings from Last 3 Encounters:  11/10/20 134/84  10/27/20 134/84  10/23/20 (!) 90/57   Wt Readings from Last 3 Encounters:  10/27/20 130 lb 12.8 oz (59.3 kg)  10/23/20 140 lb (63.5 kg)  10/13/20 130 lb (59 kg)   BMI Readings from Last 1 Encounters:  10/27/20 25.55 kg/m    *Unable to obtain current vital signs, weight, and BMI due to telephone visit type  Hearing/Vision  . Allianna did not seem to have difficulty with hearing/understanding during the telephone conversation . Reports that she has had a formal eye exam by an eye care professional within the past year . Reports that she has not had a formal hearing evaluation within  the past year *Unable to fully assess hearing and vision during telephone visit type  Cognitive Function: 6CIT Screen  11/10/2020 09/10/2019 09/10/2019  What Year? 0 points - 0 points  What month? 0 points - 0 points  What time? 0 points - 0 points  Count back from 20 2 points - 2 points  Months in reverse 0 points 4 points 2 points  Repeat phrase 2 points 2 points -  Total Score 4 - -   (Normal:0-7, Significant for Dysfunction: >8)  Normal Cognitive Function Screening: Yes   Immunization & Health Maintenance Record Immunization History  Administered Date(s) Administered  . Fluad Quad(high Dose 65+) 02/06/2019  . Influenza, High Dose Seasonal PF 04/23/2018  . Moderna Sars-Covid-2 Vaccination 08/20/2019, 09/17/2019, 05/26/2020  . Pneumococcal Conjugate-13 09/14/2017  . Pneumococcal Polysaccharide-23 11/06/2018    Health Maintenance  Topic Date Due  . Pneumococcal Vaccine 41-53 Years old (1 of 4 - PCV13) Never done  . Zoster Vaccines- Shingrix (1 of 2) Never done  . TETANUS/TDAP  10/23/2021 (Originally 04/04/1950)  . INFLUENZA VACCINE  01/04/2021  . DEXA SCAN  Completed  . COVID-19 Vaccine  Completed  . PNA vac Low Risk Adult  Completed  . HPV VACCINES  Aged Out       Assessment  This is a routine wellness examination for Amanda [name].  Health Maintenance: Due or Overdue Health Maintenance Due  Topic Date Due  . Pneumococcal Vaccine 31-39 Years old (1 of 4 - PCV13) Never done  . Zoster Vaccines- Shingrix (1 of 2) Never done    Amanda [name] does not need a referral for [employer] Assistance: Care Management:   no Social Work:    no Prescription Assistance:  no Nutrition/Diabetes Education:  no   Plan:  Personalized Goals Goals Addressed            This Visit's Progress   . Fall Prevention   Not on track    Current Barriers:  Marland Kitchen Knowledge Deficits related to fall precautions . Decreased adherence to prescribed treatment for fall prevention . Orthostatic  hypotension  Nurse Case Manager Clinical Goal(s):  Marland Kitchen Over the next 30 days, patient will verbalize using fall risk reduction strategies discussed . Over the next 90 days, patient will not experience additional falls  Interventions:  . Provided verbal education re: Potential causes of falls and Fall prevention strategies . Reviewed medications and discussed potential side effects of medications such as dizziness and frequent urination . Assessed for s/s of orthostatic hypotension . Assessed for falls since last encounter . Assessed patients knowledge of fall risk prevention secondary to previously provided education. . Encouraged patient to: Marland Kitchen Use cane for ambulation when feeling unsteady . De-clutter walkways . Change positions slowly . Wear secure fitting shoes at all times with ambulation . Utilize home lighting for dim lit areas . Have self and pet awareness at all times  Patient Self Care Activities:  . Able to perform most ADLs independently and has assistance from son  Please see past updates related to this goal by clicking on the "Past Updates" button in the selected goal      . Increase physical activity   Not on track    Stay active - she would like to find another part time job     . Prevent falls        Personalized Health Maintenance & Screening Recommendations  Pneumococcal Vaccine and Shingrix  Lung Cancer Screening Recommended: no (Low Dose CT Chest recommended if Age 37-80 years, 30 pack-year currently smoking OR have quit w/in past 15 years) Hepatitis C Screening  recommended: no HIV Screening recommended: no  Advanced Directives: Written information was not prepared per patient's request.  Referrals & Orders No orders of the defined types were placed in this encounter.   Follow-up Plan . Follow-up with Amanda Norlander, DO as planned . Schedule 04/26/21    I have personally reviewed and noted the following in the patient's chart:   . Medical  and social history . Use of alcohol, tobacco or illicit drugs  . Current medications and supplements . Functional ability and status . Nutritional status . Physical activity . Advanced directives . List of other physicians . Hospitalizations, surgeries, and ER visits in previous 12 months . Vitals . Screenings to include cognitive, depression, and falls . Referrals and appointments  In addition, I have reviewed and discussed with Amanda [name] certain preventive protocols, quality metrics, and best practice recommendations. A written personalized care plan for preventive services as well as general preventive health recommendations is available and can be mailed to the patient at her request.      [facility]  10/11/996

## 2020-11-17 ENCOUNTER — Ambulatory Visit: Payer: Medicare PPO

## 2020-11-17 ENCOUNTER — Encounter: Payer: Self-pay | Admitting: Orthopedic Surgery

## 2020-11-17 ENCOUNTER — Other Ambulatory Visit: Payer: Self-pay

## 2020-11-17 ENCOUNTER — Ambulatory Visit (INDEPENDENT_AMBULATORY_CARE_PROVIDER_SITE_OTHER): Payer: Medicare PPO | Admitting: Orthopedic Surgery

## 2020-11-17 VITALS — BP 157/72 | HR 67 | Ht 60.0 in | Wt 130.0 lb

## 2020-11-17 DIAGNOSIS — S82832D Other fracture of upper and lower end of left fibula, subsequent encounter for closed fracture with routine healing: Secondary | ICD-10-CM

## 2020-11-17 NOTE — Progress Notes (Signed)
Orthopaedic Clinic Return  Assessment: Amanda [name] is a 87 y.o. female with the following: Left distal fibula fracture  Plan: Radiographs obtained today demonstrates that her left ankle is healing.  There is been no interval displacement.  Furthermore, she ambulated with the assistance of a cane, in the clinic today.  As a result, she will continue to transition to a regular shoe.  This may happen gradually.  She can continue to use the boot as needed.  She may note swelling in her left foot and ankle for several months following the injury.  She stated her understanding.  Medications as needed.  She now proceeded weightbearing as tolerated.  Follow-up in 6 weeks for repeat evaluation.  Follow-up: Return in about 6 weeks (around 12/29/2020).   Subjective:  Chief Complaint  Patient presents with   Fracture    Lt ankle DOI 10/07/20    History of Present Illness: Amanda [name] is a 87 y.o. female who returns to clinic today for repeat evaluation of her left ankle.  She is now approximately 6 weeks out from sustaining an injury to her left ankle.  She continues to improve.  She has increased her weightbearing, and does wear the boot most the time.  She is using a cane to assist with ambulation.  She has started wearing regular shoes intermittently.  Occasional sharp pains in her ankle, primarily at nighttime.  Review of Systems: No fevers or chills No numbness or tingling No chest pain No shortness of breath No bowel or bladder dysfunction No GI distress No headaches    Objective: Ht 5' (1.524 m)   Wt 130 lb (59 kg)   BMI 25.39 kg/m   Physical Exam:  Elderly female.  No acute distress.  Ambulates in a walking boot, using a cane.  Evaluation of left ankle demonstrates no swelling.  No bruising is appreciated.  She continues to have some tenderness to deep palpation of the distal fibula.  Active dorsiflexion of the ankle and the great toe is witnessed.  Good strength without pain  during eversion and inversion.  Easily gets to a plantigrade position.  IMAGING: I personally ordered and reviewed the following images:   X-rays left ankle were obtained in clinic today, compared to previous images.  There is no interval displacement.  Interval consolidation is appreciated at the distal fibula fracture site.  Mortise remains intact.  There is no syndesmotic disruption.  No acute injuries.  Impression: Healing, minimally displaced left distal fibula fracture  Mordecai Rasmussen, MD 11/17/2020 11:30 AM

## 2020-11-18 ENCOUNTER — Encounter: Payer: Self-pay | Admitting: Gastroenterology

## 2020-12-28 ENCOUNTER — Ambulatory Visit: Payer: Medicare PPO | Admitting: Gastroenterology

## 2020-12-29 ENCOUNTER — Ambulatory Visit (INDEPENDENT_AMBULATORY_CARE_PROVIDER_SITE_OTHER): Payer: Medicare HMO | Admitting: Orthopedic Surgery

## 2020-12-29 ENCOUNTER — Other Ambulatory Visit: Payer: Self-pay

## 2020-12-29 ENCOUNTER — Encounter: Payer: Self-pay | Admitting: Orthopedic Surgery

## 2020-12-29 ENCOUNTER — Ambulatory Visit: Payer: Medicare HMO

## 2020-12-29 VITALS — Ht 60.0 in | Wt 130.0 lb

## 2020-12-29 DIAGNOSIS — S82832D Other fracture of upper and lower end of left fibula, subsequent encounter for closed fracture with routine healing: Secondary | ICD-10-CM | POA: Diagnosis not present

## 2020-12-29 NOTE — Progress Notes (Signed)
Orthopaedic Clinic Return  Assessment: Amanda [name] is a 87 y.o. female with the following: Left distal fibula fracture  Plan: Repeat radiographs obtained in clinic today demonstrates continued healing.  She has no tenderness to palpation on physical exam.  She is now using a regular shoe to assist with ambulation.  She is pleased with her progress to date.  I have advised her to continue to work on range of motion, as well as strengthening of her ankle.  Walking is excellent exercise.  I have given her some ankle sprain exercises that will help with her range of motion and continued strengthening.  If she has any issues in the future, she can contact the clinic.  Otherwise, follow-up as needed.   Follow-up: Return if symptoms worsen or fail to improve.   Subjective:  Chief Complaint  Patient presents with   Fracture    Lt ankle fx DOI 10/07/20    History of Present Illness: Amanda [name] is a 87 y.o. female who returns to clinic today for repeat evaluation of her left ankle.  She sustained a left distal fibula fracture approximately 3 months ago.  Since last visit, she has been progressing her range of motion.  Over the past week, she has transition to a regular shoe.  She is able to walk without hesitation.  Her pain is controlled.  She has not been doing any specific exercises for her ankle, but continues to get better.   Review of Systems: No fevers or chills No numbness or tingling No chest pain No shortness of breath No bowel or bladder dysfunction No GI distress No headaches    Objective: Ht 5' (1.524 m)   Wt 130 lb (59 kg)   BMI 25.39 kg/m   Physical Exam:  Elderly female.  No acute distress.  She walks with a nonantalgic gait.  She is wearing a regular shoe.  No swelling about the ankle.  The fracture, and associated callus formation is palpable.  This is not tender to palpation.  5 degrees of dorsiflexion with her knee in full extension.  15 degrees of dorsiflexion  with the knee in full flexion.  Toes are warm and well-perfused.  Sensation is intact in the dorsum of the foot.  IMAGING: I personally ordered and reviewed the following images:   X-rays of the left ankle were obtained in clinic today compared to previous x-rays.  There has been no interval displacement at the fracture site of the distal fibula.  The mortise remains intact.  No syndesmotic disruption.  Impression: Healing left distal fibula fracture, without interval displacement.  Mordecai Rasmussen, MD 12/29/2020 10:51 AM

## 2020-12-29 NOTE — Patient Instructions (Signed)
Instructions  1.  You have sustained an ankle sprain  2.  I encourage you to stay on your feet and gradually remove your walking boot.   3.  Below are some exercises that you can complete on your own to improve your symptoms.  4.  As an alternative, you can search for ankle sprain exercises online, and can see some demonstrations on YouTube  5.  If you are having difficulty with these exercises, we can also prescribe formal physical therapy  Ankle Exercises Ask your health care provider which exercises are safe for you. Do exercises exactly as told by your health care provider and adjust them as directed. It is normal to feel mild stretching, pulling, tightness, or mild discomfort as you do these exercises. Stop right away if you feel sudden pain or your pain gets worse. Do not begin these exercises until told by your health care provider.  Stretching and range-of-motion exercises These exercises warm up your muscles and joints and improve the movement and flexibility of your ankle. These exercises [place] also help to relieve pain.  Dorsiflexion/plantar flexion  Sit with your L knee straight or bent. Do not rest your foot on anything. Flex your left ankle to tilt the top of your foot toward your shin. This is called dorsiflexion. Hold this position for 5 seconds. Point your toes downward to tilt the top of your foot away from your shin. This is called plantar flexion. Hold this position for 5 seconds. Repeat 10 times. Complete this exercise 2-3 times a day.  As tolerated  Ankle alphabet  Sit with your L foot supported at your lower leg. Do not rest your foot on anything. Make sure your foot has room to move freely. Think of your L foot as a paintbrush: Move your foot to trace each letter of the alphabet in the air. Keep your hip and knee still while you trace the letters. Trace every letter from A to Z. Make the letters as large as you can without causing or increasing any  discomfort.  Repeat 2-3 times. Complete this exercise 2-3 times a day.   Strengthening exercises These exercises build strength and endurance in your ankle. Endurance is the ability to use your muscles for a long time, even after they get tired. Dorsiflexors These are muscles that lift your foot up. Secure a rubber exercise band or tube to an object, such as a table leg, that will stay still when the band is pulled. Secure the other end around your L foot. Sit on the floor, facing the object with your L leg extended. The band or tube should be slightly tense when your foot is relaxed. Slowly flex your L ankle and toes to bring your foot toward your shin. Hold this position for 5 seconds. Slowly return your foot to the starting position, controlling the band as you do that. Repeat 10 times. Complete this exercise 2-3 times a day.  Plantar flexors These are muscles that push your foot down. Sit on the floor with your L leg extended. Loop a rubber exercise band or tube around the ball of your L foot. The ball of your foot is on the walking surface, right under your toes. The band or tube should be slightly tense when your foot is relaxed. Slowly point your toes downward, pushing them away from you. Hold this position for 5 seconds. Slowly release the tension in the band or tube, controlling smoothly until your foot is back in the starting  position. Repeat 10 times. Complete this exercise 2-3 times a day.  Towel curls  Sit in a chair on a non-carpeted surface, and put your feet on the floor. Place a towel in front of your feet. Keeping your heel on the floor, put your L foot on the towel. Pull the towel toward you by grabbing the towel with your toes and curling them under. Keep your heel on the floor. Let your toes relax. Grab the towel again. Keep pulling the towel until it is completely underneath your foot. Repeat 10 times. Complete this exercise 2-3 times a day.  Standing plantar  flexion This is an exercise in which you use your toes to lift your body's weight while standing. Stand with your feet shoulder-width apart. Keep your weight spread evenly over the width of your feet while you rise up on your toes. Use a wall or table to steady yourself if needed, but try not to use it for support. If this exercise is too easy, try these options: Shift your weight toward your L leg until you feel challenged. If told by your health care provider, lift your uninjured leg off the floor. Hold this position for 5 seconds. Repeat 10 times. Complete this exercise 2-3 times a day.  Tandem walking Stand with one foot directly in front of the other. Slowly raise your back foot up, lifting your heel before your toes, and place it directly in front of your other foot. Continue to walk in this heel-to-toe way. Have a countertop or wall nearby to use if needed to keep your balance, but try not to hold onto anything for support.  Repeat 10 times. Complete this exercise 2-3 times a day.   Document Revised: 02/17/2018 Document Reviewed: 02/19/2018 Elsevier Patient Education  [name].

## 2020-12-30 ENCOUNTER — Other Ambulatory Visit: Payer: Medicare HMO

## 2020-12-30 DIAGNOSIS — R809 Proteinuria, unspecified: Secondary | ICD-10-CM | POA: Diagnosis not present

## 2020-12-30 DIAGNOSIS — D631 Anemia in chronic kidney disease: Secondary | ICD-10-CM | POA: Diagnosis not present

## 2020-12-30 DIAGNOSIS — Z79899 Other long term (current) drug therapy: Secondary | ICD-10-CM | POA: Diagnosis not present

## 2020-12-30 DIAGNOSIS — N1831 Chronic kidney disease, stage 3a: Secondary | ICD-10-CM | POA: Diagnosis not present

## 2020-12-31 ENCOUNTER — Encounter (HOSPITAL_COMMUNITY): Payer: Self-pay

## 2020-12-31 ENCOUNTER — Emergency Department (HOSPITAL_COMMUNITY): Payer: Medicare HMO

## 2020-12-31 ENCOUNTER — Other Ambulatory Visit: Payer: Self-pay

## 2020-12-31 ENCOUNTER — Observation Stay (HOSPITAL_COMMUNITY)
Admission: EM | Admit: 2020-12-31 | Discharge: 2021-01-01 | Disposition: A | Discharge status: Home / Self Care | Payer: Medicare HMO | Attending: Internal Medicine | Admitting: Internal Medicine

## 2020-12-31 ENCOUNTER — Observation Stay (HOSPITAL_COMMUNITY): Payer: Medicare HMO

## 2020-12-31 DIAGNOSIS — Z79899 Other long term (current) drug therapy: Secondary | ICD-10-CM | POA: Insufficient documentation

## 2020-12-31 DIAGNOSIS — R809 Proteinuria, unspecified: Secondary | ICD-10-CM | POA: Diagnosis not present

## 2020-12-31 DIAGNOSIS — R531 Weakness: Secondary | ICD-10-CM | POA: Diagnosis not present

## 2020-12-31 DIAGNOSIS — Z87891 Personal history of nicotine dependence: Secondary | ICD-10-CM | POA: Insufficient documentation

## 2020-12-31 DIAGNOSIS — I2510 Atherosclerotic heart disease of native coronary artery without angina pectoris: Secondary | ICD-10-CM | POA: Insufficient documentation

## 2020-12-31 DIAGNOSIS — M810 Age-related osteoporosis without current pathological fracture: Secondary | ICD-10-CM

## 2020-12-31 DIAGNOSIS — R2990 Unspecified symptoms and signs involving the nervous system: Secondary | ICD-10-CM

## 2020-12-31 DIAGNOSIS — I5032 Chronic diastolic (congestive) heart failure: Secondary | ICD-10-CM | POA: Diagnosis not present

## 2020-12-31 DIAGNOSIS — Z8673 Personal history of transient ischemic attack (TIA), and cerebral infarction without residual deficits: Secondary | ICD-10-CM

## 2020-12-31 DIAGNOSIS — R29818 Other symptoms and signs involving the nervous system: Secondary | ICD-10-CM | POA: Diagnosis not present

## 2020-12-31 DIAGNOSIS — I129 Hypertensive chronic kidney disease with stage 1 through stage 4 chronic kidney disease, or unspecified chronic kidney disease: Secondary | ICD-10-CM | POA: Insufficient documentation

## 2020-12-31 DIAGNOSIS — Y900 Blood alcohol level of less than 20 mg/100 ml: Secondary | ICD-10-CM | POA: Insufficient documentation

## 2020-12-31 DIAGNOSIS — R2681 Unsteadiness on feet: Secondary | ICD-10-CM | POA: Insufficient documentation

## 2020-12-31 DIAGNOSIS — G319 Degenerative disease of nervous system, unspecified: Secondary | ICD-10-CM | POA: Diagnosis not present

## 2020-12-31 DIAGNOSIS — I10 Essential (primary) hypertension: Secondary | ICD-10-CM | POA: Diagnosis not present

## 2020-12-31 DIAGNOSIS — M79604 Pain in right leg: Secondary | ICD-10-CM

## 2020-12-31 DIAGNOSIS — Z7982 Long term (current) use of aspirin: Secondary | ICD-10-CM | POA: Insufficient documentation

## 2020-12-31 DIAGNOSIS — R4182 Altered mental status, unspecified: Secondary | ICD-10-CM | POA: Diagnosis not present

## 2020-12-31 DIAGNOSIS — I672 Cerebral atherosclerosis: Secondary | ICD-10-CM | POA: Diagnosis not present

## 2020-12-31 DIAGNOSIS — Z20822 Contact with and (suspected) exposure to covid-19: Secondary | ICD-10-CM | POA: Diagnosis not present

## 2020-12-31 DIAGNOSIS — R4781 Slurred speech: Secondary | ICD-10-CM | POA: Diagnosis not present

## 2020-12-31 DIAGNOSIS — R410 Disorientation, unspecified: Secondary | ICD-10-CM | POA: Diagnosis not present

## 2020-12-31 DIAGNOSIS — G459 Transient cerebral ischemic attack, unspecified: Secondary | ICD-10-CM | POA: Diagnosis not present

## 2020-12-31 DIAGNOSIS — R9431 Abnormal electrocardiogram [ECG] [EKG]: Secondary | ICD-10-CM | POA: Diagnosis not present

## 2020-12-31 DIAGNOSIS — I639 Cerebral infarction, unspecified: Secondary | ICD-10-CM | POA: Diagnosis not present

## 2020-12-31 DIAGNOSIS — I6613 Occlusion and stenosis of bilateral anterior cerebral arteries: Secondary | ICD-10-CM | POA: Diagnosis not present

## 2020-12-31 DIAGNOSIS — I6621 Occlusion and stenosis of right posterior cerebral artery: Secondary | ICD-10-CM | POA: Diagnosis not present

## 2020-12-31 DIAGNOSIS — N183 Chronic kidney disease, stage 3 unspecified: Secondary | ICD-10-CM | POA: Diagnosis not present

## 2020-12-31 DIAGNOSIS — N1832 Chronic kidney disease, stage 3b: Secondary | ICD-10-CM | POA: Diagnosis not present

## 2020-12-31 DIAGNOSIS — R569 Unspecified convulsions: Secondary | ICD-10-CM | POA: Diagnosis not present

## 2020-12-31 DIAGNOSIS — I771 Stricture of artery: Secondary | ICD-10-CM | POA: Diagnosis not present

## 2020-12-31 LAB — COMPREHENSIVE METABOLIC PANEL
ALT: 11 U/L (ref 0–44)
AST: 15 U/L (ref 15–41)
Albumin: 4.2 g/dL (ref 3.5–5.0)
Alkaline Phosphatase: 85 U/L (ref 38–126)
Anion gap: 7 (ref 5–15)
BUN: 12 mg/dL (ref 8–23)
CO2: 26 mmol/L (ref 22–32)
Calcium: 10.4 mg/dL — ABNORMAL HIGH (ref 8.9–10.3)
Chloride: 106 mmol/L (ref 98–111)
Creatinine, Ser: 0.9 mg/dL (ref 0.44–1.00)
GFR, Estimated: >60 mL/min (ref >60)
Glucose, Bld: 106 mg/dL — ABNORMAL HIGH (ref 70–99)
Potassium: 4 mmol/L (ref 3.5–5.1)
Sodium: 139 mmol/L (ref 135–145)
Total Bilirubin: 0.7 mg/dL (ref 0.3–1.2)
Total Protein: 7.1 g/dL (ref 6.5–8.1)

## 2020-12-31 LAB — RESP PANEL BY RT-PCR (FLU A&B, COVID) ARPGX2
Influenza A by PCR: NEGATIVE
Influenza B by PCR: NEGATIVE
SARS Coronavirus 2 by RT PCR: NEGATIVE

## 2020-12-31 LAB — I-STAT CHEM 8, ED
BUN: 11 mg/dL (ref 8–23)
Calcium, Ion: 1.41 mmol/L — ABNORMAL HIGH (ref 1.15–1.40)
Chloride: 106 mmol/L (ref 98–111)
Creatinine, Ser: 1 mg/dL (ref 0.44–1.00)
Glucose, Bld: 104 mg/dL — ABNORMAL HIGH (ref 70–99)
HCT: 41 % (ref 36.0–46.0)
Hemoglobin: 13.9 g/dL (ref 12.0–15.0)
Potassium: 4 mmol/L (ref 3.5–5.1)
Sodium: 142 mmol/L (ref 135–145)
TCO2: 26 mmol/L (ref 22–32)

## 2020-12-31 LAB — CBC
HCT: 42.6 % (ref 36.0–46.0)
Hemoglobin: 13.6 g/dL (ref 12.0–15.0)
MCH: 31.9 pg (ref 26.0–34.0)
MCHC: 31.9 g/dL (ref 30.0–36.0)
MCV: 99.8 fL (ref 80.0–100.0)
Platelets: 181 10*3/uL (ref 150–400)
RBC: 4.27 MIL/uL (ref 3.87–5.11)
RDW: 12.5 % (ref 11.5–15.5)
WBC: 6.5 10*3/uL (ref 4.0–10.5)
nRBC: 0 % (ref 0.0–0.2)

## 2020-12-31 LAB — DIFFERENTIAL
Abs Immature Granulocytes: 0.02 10*3/uL (ref 0.00–0.07)
Basophils Absolute: 0.1 10*3/uL (ref 0.0–0.1)
Basophils Relative: 1 %
Eosinophils Absolute: 0.2 10*3/uL (ref 0.0–0.5)
Eosinophils Relative: 3 %
Immature Granulocytes: 0 %
Lymphocytes Relative: 23 %
Lymphs Abs: 1.5 10*3/uL (ref 0.7–4.0)
Monocytes Absolute: 0.7 10*3/uL (ref 0.1–1.0)
Monocytes Relative: 11 %
Neutro Abs: 4 10*3/uL (ref 1.7–7.7)
Neutrophils Relative %: 62 %

## 2020-12-31 LAB — CBG MONITORING, ED: Glucose-Capillary: 101 mg/dL — ABNORMAL HIGH (ref 70–99)

## 2020-12-31 LAB — ETHANOL: Alcohol, Ethyl (B): <10 mg/dL (ref <10)

## 2020-12-31 LAB — PROTIME-INR
INR: 1 (ref 0.8–1.2)
Prothrombin Time: 13.3 seconds (ref 11.4–15.2)

## 2020-12-31 LAB — APTT: aPTT: 29 seconds (ref 24–36)

## 2020-12-31 MED ORDER — ATORVASTATIN CALCIUM 40 MG PO TABS: 40.0000 mg | ORAL_TABLET | Freq: Every day | ORAL | Status: DC

## 2020-12-31 MED ORDER — POLYETHYLENE GLYCOL 3350 17 G PO PACK: 17.0000 g | PACK | Freq: Every day | ORAL | Status: DC | PRN

## 2020-12-31 MED ORDER — ONDANSETRON HCL 4 MG PO TABS: 4.0000 mg | ORAL_TABLET | Freq: Four times a day (QID) | ORAL | Status: DC | PRN

## 2020-12-31 MED ORDER — ACETAMINOPHEN 325 MG PO TABS: 650.0000 mg | ORAL_TABLET | Freq: Four times a day (QID) | ORAL | Status: DC | PRN

## 2020-12-31 MED ORDER — ASPIRIN EC 81 MG PO TBEC
81.0000 mg | DELAYED_RELEASE_TABLET | Freq: Every day | ORAL | Status: DC
  Administered 2021-01-01: 81 mg via ORAL
  Filled 2020-12-31: qty 1

## 2020-12-31 MED ORDER — ACETAMINOPHEN 650 MG RE SUPP: 650.0000 mg | Freq: Four times a day (QID) | RECTAL | Status: DC | PRN

## 2020-12-31 MED ORDER — ASPIRIN 325 MG PO TABS
325.0000 mg | ORAL_TABLET | Freq: Once | ORAL | Status: AC
  Administered 2020-12-31: 325 mg via ORAL
  Filled 2020-12-31: qty 1

## 2020-12-31 MED ORDER — ONDANSETRON HCL 4 MG/2ML IJ SOLN: 4.0000 mg | Freq: Four times a day (QID) | INTRAMUSCULAR | Status: DC | PRN

## 2020-12-31 MED ORDER — IOHEXOL 350 MG/ML SOLN
100.0000 mL | Freq: Once | INTRAVENOUS | Status: AC | PRN
  Administered 2020-12-31: 100 mL via INTRAVENOUS

## 2020-12-31 MED ORDER — DARIFENACIN HYDROBROMIDE ER 7.5 MG PO TB24
7.5000 mg | ORAL_TABLET | Freq: Every day | ORAL | Status: DC
  Administered 2021-01-01: 7.5 mg via ORAL
  Filled 2020-12-31: qty 1

## 2020-12-31 MED ORDER — PANTOPRAZOLE SODIUM 40 MG PO TBEC
40.0000 mg | DELAYED_RELEASE_TABLET | Freq: Every day | ORAL | Status: DC
  Administered 2021-01-01: 40 mg via ORAL
  Filled 2020-12-31: qty 1

## 2020-12-31 MED ORDER — ENOXAPARIN SODIUM 40 MG/0.4ML IJ SOSY
40.0000 mg | PREFILLED_SYRINGE | INTRAMUSCULAR | Status: DC
  Administered 2021-01-01: 40 mg via SUBCUTANEOUS
  Filled 2020-12-31: qty 0.4
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
## 2020-12-31 NOTE — ED Provider Notes (Signed)
Blood pressure (!) 139/99, pulse 76, temperature 98.4 F (36.9 C), temperature source Oral, resp. rate (!) 21, height 5' (1.524 m), weight 59 kg, SpO2 96 %.  Assuming care from Dr. Roderic Palau.  In short, Amanda [name] is a 87 y.o. female with a chief complaint of Altered Mental Status .  Refer to the original H&P for additional details.  The current plan of care is to f/u on MRI and admit for TIA vs seizure.  Discussed patient's case with [facility] to request admission. Patient and family (if present) updated with plan. Care transferred to [facility] service.  I reviewed all nursing notes, vitals, pertinent old records, EKGs, labs, imaging (as available).    Margette Fast, MD 01/04/21 [phone]

## 2020-12-31 NOTE — ED Notes (Signed)
Pt assisted to bathroom and back to bed via wheelchair 

## 2020-12-31 NOTE — ED Notes (Signed)
Put pt on 12 lead '@14'$ .20
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
## 2020-12-31 NOTE — ED Provider Notes (Signed)
[facility] EMERGENCY DEPARTMENT Provider Note   CSN: [id number] Arrival date & time: 12/31/20  1341  An emergency department physician performed an initial assessment on this suspected stroke patient at 1400.  History Chief Complaint  Patient presents with   Altered Mental Status    Amanda [name] is a 87 y.o. female.  Patient presents with confusion.  Patient also had slurred speech earlier today she has a history of having this before.  Her son said that she was normal at 62  The history is provided by a relative and medical records. No language interpreter was used.  Altered Mental Status Presenting symptoms: behavior changes   Severity:  Mild Most recent episode:  Today Episode history:  Continuous Timing:  Constant Chronicity:  Recurrent Context: not alcohol use       Past Medical History:  Diagnosis Date   Allergy    Anxiety    Coronary artery disease    LAD 30% followed by 60% stenosis; pressure wire measurement demonstrated no significant gradient; circumflex had 30% stenosis, right coronary artery has 40% stenosed; EF 75-80%   Diverticulosis    GERD (gastroesophageal reflux disease)    Hematuria    Hyperlipidemia    Hypertension    Impaired renal function 04/23/2018   Stroke ([place]) 2003   With a left MCA occlusion   TIA (transient ischemic attack)     Patient Active Problem List   Diagnosis Date Noted   Dysphagia 07/28/2020   Abdominal pain 07/28/2020   Recurrent UTI 03/19/2020   Leg pain, posterior, right 10/08/2019   OAB (overactive bladder) 09/18/2019   Chronic cystitis with hematuria 06/19/2019   Hematochezia 02/13/2019   Melena 02/13/2019   Anemia 02/13/2019   Constipation 02/13/2019   Absolute anemia 01/31/2019   Disorder of phosphorus metabolism 01/31/2019   CKD (chronic kidney disease) stage 3, GFR 30-59 ml/min (HCC) 07/20/2018   Postural dizziness 04/23/2018   Osteoporosis 01/15/2018   Transient cerebral ischemia 04/10/2013    Hyperlipidemia 09/22/2008   Essential hypertension 09/22/2008   Coronary atherosclerosis 09/22/2008   History of CVA (cerebrovascular accident) 09/22/2008   GERD 09/22/2008   Diverticulosis of colon 09/22/2008   Cerebral infarction due to unspecified occlusion or stenosis of unspecified cerebral artery ([place]) 09/22/2008    Past Surgical History:  Procedure Laterality Date   ABDOMINAL HYSTERECTOMY     partial and complete - x  surgeries   APPENDECTOMY     CHOLECYSTECTOMY     COLONOSCOPY N/A 04/19/2019   TI normal, four sessile polyps, 2-6 mm in size, multiple small and large-mouthed diverticula in entire colon, external and internal hemorrhoids, (tubular adenoma, sessile serrated polyp without dysplasia)   ESOPHAGOGASTRODUODENOSCOPY N/A 04/19/2019   mild schatzki ring s/p dilation, multiple gastric polyps (fundic), non-bleeding duodenal diverticulum, benign small bowel, no villous, no dysplasia)   EYE SURGERY Bilateral    cataracts   Hysterectomy-type unspecified     POLYPECTOMY  04/19/2019   Procedure: POLYPECTOMY;  Surgeon: Danie Binder, MD;  Location[facility];  Service: Endoscopy;;  colon    TONSILLECTOMY       OB History   No obstetric history on file.     Family History  Problem Relation Age of Onset   Colon cancer Mother 73       dx on colonoscopy    Cancer Mother        colon   Cancer Father        unsure if cancer  --- tumor  on brain   Heart attack Sister    Heart attack Brother    Diabetes Brother    GI problems Son    COPD Sister    Hypertension Brother    Heart disease Brother    Heart attack Brother    Heart disease Brother    Alcohol abuse Brother     Social History   Tobacco Use   Smoking status: Former    Packs/day: 0.25    Years: 3.00    Pack years: 0.75    Types: Cigarettes    Quit date: 06/06/1985    Years since quitting: 35.5   Smokeless tobacco: Never   Tobacco comments:    Approximately 10-pack-year history  Vaping Use    Vaping Use: Never used  Substance Use Topics   Alcohol use: No   Drug use: No    Home Medications Prior to Admission medications   Medication Sig Start Date End Date Taking? Authorizing Provider  aspirin EC 81 MG tablet Take 81 mg by mouth daily. Swallow whole.   Yes [provider]  atorvastatin (LIPITOR) 20 MG tablet TAKE 1 TABLET AT BEDTIME 10/29/20  Yes Ronnie Doss M, DO  Cholecalciferol 25 MCG (1000 UT) capsule Take 1,000 Units by mouth daily. 01/22/20 01/21/21 Yes [provider]  Docusate Sodium (DSS) 100 MG CAPS Take 1 capsule by mouth as needed.   Yes [provider]  iron polysaccharides (NIFEREX) 150 MG capsule Take 150 mg by mouth daily.   Yes [provider]  loratadine (CLARITIN) 10 MG tablet TAKE 1 TABLET EVERY DAY 06/26/20  Yes Ronnie Doss M, DO  Multiple Vitamins-Minerals (PRESERVISION AREDS 2) CAPS Take 1 capsule by mouth 2 (two) times a day. 11/06/18  Yes Gottschalk, Ashly M, DO  omeprazole (PRILOSEC) 20 MG capsule TAKE 1 CAPSULE (20 MG TOTAL) BY MOUTH 2 (TWO) TIMES DAILY BEFORE A MEAL. 03/26/20  Yes Annitta Needs, NP  solifenacin (VESICARE) 5 MG tablet Take 1 tablet (5 mg total) by mouth daily. 03/19/20  Yes McKenzie, Candee Furbish, MD  verapamil (CALAN-SR) 180 MG CR tablet TAKE 1 TABLET AT BEDTIME (REPLACES '120MG'$ ). Patient taking differently: Take 180 mg by mouth at bedtime. TAKE 1 TABLET AT BEDTIME (REPLACES '120MG'$ ). 11/03/20  Yes Gottschalk, Ashly M, DO  VITAMIN D PO 1,000 Int'l Units/day. 07/22/20  Yes [provider]  atorvastatin (LIPITOR) 20 MG tablet Take by mouth. Patient not taking: Reported on 12/31/2020 08/26/19   [provider]  Docusate Sodium (DSS) 250 MG CAPS Take by mouth.    [provider]  trimethoprim (TRIMPEX) 100 MG tablet Take 1 tablet (100 mg total) by mouth at bedtime. Patient not taking: Reported on 12/31/2020 03/19/20   Cleon Gustin, MD    Allergies    Livalo [pitavastatin],  Simvastatin, and Lisinopril  Review of Systems   Review of Systems  Unable to perform ROS: Mental status change   Physical Exam Updated Vital Signs BP (!) 139/99   Pulse 76   Temp 98.4 F (36.9 C) (Oral)   Resp (!) 21   Ht 5' (1.524 m)   Wt 59 kg   SpO2 96%   BMI 25.39 kg/m   Physical Exam Vitals and nursing note reviewed.  Constitutional:      Appearance: She is well-developed.  HENT:     Head: Normocephalic.     Nose: Nose normal.  Eyes:     General: No scleral icterus.    Conjunctiva/sclera: Conjunctivae normal.  Neck:     Thyroid: No thyromegaly.  Cardiovascular:     Rate and Rhythm: Normal rate and regular rhythm.     Heart sounds: No murmur heard.   No friction rub. No gallop.  Pulmonary:     Breath sounds: No stridor. No wheezing or rales.  Chest:     Chest wall: No tenderness.  Abdominal:     General: There is no distension.     Tenderness: There is no abdominal tenderness. There is no rebound.  Musculoskeletal:        General: Normal range of motion.     Cervical back: Neck supple.  Lymphadenopathy:     Cervical: No cervical adenopathy.  Skin:    Findings: No erythema or rash.  Neurological:     Mental Status: She is alert.     Motor: No abnormal muscle tone.     Coordination: Coordination normal.     Comments: Patient oriented to person place and situation  Psychiatric:        Behavior: Behavior normal.    ED Results / Procedures / Treatments   Labs (all labs ordered are listed, but only abnormal results are displayed) Labs Reviewed  COMPREHENSIVE METABOLIC PANEL - Abnormal; Notable for the following components:      Result Value   Glucose, Bld 106 (*)    Calcium 10.4 (*)    All other components within normal limits  CBG MONITORING, ED - Abnormal; Notable for the following components:   Glucose-Capillary 101 (*)    All other components within normal limits  I-STAT CHEM 8, ED - Abnormal; Notable for the following components:   Glucose,  Bld 104 (*)    Calcium, Ion 1.41 (*)    All other components within normal limits  RESP PANEL BY RT-PCR (FLU A&B, COVID) ARPGX2  ETHANOL  PROTIME-INR  APTT  CBC  DIFFERENTIAL  RAPID URINE DRUG SCREEN, HOSP PERFORMED  URINALYSIS, ROUTINE W REFLEX MICROSCOPIC    EKG None  Radiology CT HEAD CODE STROKE WO CONTRAST  Result Date: 12/31/2020 CLINICAL DATA:  Code stroke.  Acute neuro deficit EXAM: CT HEAD WITHOUT CONTRAST TECHNIQUE: Contiguous axial images were obtained from the base of the skull through the vertex without intravenous contrast. COMPARISON:  CT head 04/09/2013 FINDINGS: Brain: Moderate atrophy with progression from the prior study. Moderate white matter changes with diffuse white matter hypodensity with progression from the prior study. Negative for acute infarct, hemorrhage, mass Vascular: Negative for hyperdense vessel Skull: Negative Sinuses/Orbits: Probable retention cyst left maxillary sinus. Bilateral cataract extraction Other: None ASPECTS ([place] Stroke Program Early CT Score) - Ganglionic level infarction (caudate, lentiform nuclei, internal capsule, insula, M1-M3 cortex): 7 - Supraganglionic infarction (M4-M6 cortex): 3 Total score (0-10 with 10 being normal): 10 IMPRESSION: 1. No acute abnormality. 2. Progression of atrophy and chronic microvascular ischemic change in the white matter since 2014 3. ASPECTS is 10 4. These results were called by telephone at the time of interpretation on 12/31/2020 at 2:13 pm to provider [facility] Nicholai Willette , who verbally acknowledged these results. Electronically Signed   By: Franchot Gallo M.D.   On: 12/31/2020 14:14    Procedures Procedures   Medications Ordered in ED Medications - No data to display  ED Course  I have reviewed the triage vital signs and the nursing notes.  Pertinent labs & imaging results that were available during my care of the patient were reviewed by me and considered in my medical decision making (see chart for  details).   CRITICAL CARE Performed by: Milton Ferguson Total critical care time: 58mnutes Critical care time was exclusive of separately billable procedures and treating other patients. Critical care was necessary to treat or prevent imminent or life-threatening deterioration. Critical care was time spent personally by me on the following activities: development of treatment plan with patient and/or surrogate as well as nursing, discussions with consultants, evaluation of patient's response to treatment, examination of patient, obtaining history from patient or surrogate, ordering and performing treatments and interventions, ordering and review of laboratory studies, ordering and review of radiographic studies, pulse oximetry and re-evaluation of patient's condition. Code stroke was called.  Patient was seen by neurologist and tPA was not ordered. MDM Rules/Calculators/A&P                           Patient with altered mental status is improving.  Neurology feels like this is either a TIA or seizure.  Patient is getting an MRI will be admitted Final Clinical Impression(s) / ED Diagnoses Final diagnoses:  None    Rx / DC Orders ED Discharge Orders     None        ZMilton Ferguson MD 01/04/21 1029

## 2020-12-31 NOTE — Progress Notes (Signed)
Code Stroke notes Call time 1401  Beeper time 1402 Exam started 1407 Exam finished 1408 Images sent to soc 1408 Exam completed in epic 1409 Gr called 1410

## 2021-01-01 ENCOUNTER — Observation Stay (HOSPITAL_BASED_OUTPATIENT_CLINIC_OR_DEPARTMENT_OTHER): Payer: Medicare HMO

## 2021-01-01 ENCOUNTER — Observation Stay (HOSPITAL_COMMUNITY)
Admit: 2021-01-01 | Discharge: 2021-01-01 | Disposition: A | Discharge status: Home / Self Care | Payer: Medicare HMO | Attending: Family Medicine | Admitting: Family Medicine

## 2021-01-01 DIAGNOSIS — G459 Transient cerebral ischemic attack, unspecified: Secondary | ICD-10-CM

## 2021-01-01 DIAGNOSIS — R4182 Altered mental status, unspecified: Secondary | ICD-10-CM | POA: Diagnosis not present

## 2021-01-01 LAB — HEMOGLOBIN A1C
Hgb A1c MFr Bld: 5.8 % — ABNORMAL HIGH (ref 4.8–5.6)
Mean Plasma Glucose: 119.76 mg/dL

## 2021-01-01 LAB — ECHOCARDIOGRAM COMPLETE
AR max vel: 2.18 cm2
AV Area VTI: 2.19 cm2
AV Area mean vel: 2.01 cm2
AV Mean grad: 8.9 mmHg
AV Peak grad: 15.7 mmHg
Ao pk vel: 1.98 m/s
Area-P 1/2: 6.02 cm2
Height: 60 in
S' Lateral: 1.9 cm
Weight: 2080 oz

## 2021-01-01 LAB — URINALYSIS, ROUTINE W REFLEX MICROSCOPIC
Bacteria, UA: NONE SEEN
Bilirubin Urine: NEGATIVE
Glucose, UA: NEGATIVE mg/dL
Hgb urine dipstick: NEGATIVE
Ketones, ur: NEGATIVE mg/dL
Nitrite: NEGATIVE
Protein, ur: NEGATIVE mg/dL
Specific Gravity, Urine: 1.031 — ABNORMAL HIGH (ref 1.005–1.030)
pH: 7 (ref 5.0–8.0)

## 2021-01-01 LAB — RAPID URINE DRUG SCREEN, HOSP PERFORMED
Amphetamines: NOT DETECTED
Barbiturates: NOT DETECTED
Benzodiazepines: NOT DETECTED
Cocaine: NOT DETECTED
Opiates: NOT DETECTED
Tetrahydrocannabinol: NOT DETECTED

## 2021-01-01 LAB — LIPID PANEL
Cholesterol: 109 mg/dL (ref 0–200)
HDL: 39 mg/dL — ABNORMAL LOW (ref >40)
LDL Cholesterol: 23 mg/dL (ref 0–99)
Total CHOL/HDL Ratio: 2.8 RATIO
Triglycerides: 236 mg/dL — ABNORMAL HIGH (ref <150)
VLDL: 47 mg/dL — ABNORMAL HIGH (ref 0–40)

## 2021-01-01 MED ORDER — ASPIRIN EC 81 MG PO TBEC
81.0000 mg | DELAYED_RELEASE_TABLET | Freq: Every day | ORAL | 5 refills | Status: DC
Start: 2021-01-01 — End: 2021-03-29

## 2021-01-01 MED ORDER — CEPHALEXIN 500 MG PO CAPS: 500.0000 mg | ORAL_CAPSULE | Freq: Three times a day (TID) | ORAL | 0 refills | Status: AC

## 2021-01-01 MED ORDER — SODIUM CHLORIDE 0.9 % IV SOLN
1.0000 g | INTRAVENOUS | Status: DC
  Administered 2021-01-01: 1 g via INTRAVENOUS
  Filled 2021-01-01: qty 10

## 2021-01-01 NOTE — Discharge Instructions (Signed)
1)Urinary tract infection may be contributing to your confusion  2)Please take Keflex/cephalexin antibiotics as prescribed for the next 5 days follow-up with your primary care physician for recheck and reevaluation you may need a repeat urine test if confusion and other symptoms persist to make sure that the urinary tract infection   3) you most likely have underlying dementia--- forgetfulness and cognitive/memory deficits--- please follow-up with the primary care physician for further evaluation  4) outpatient physical therapy advised to help keep you stronger and improve your balance

## 2021-01-01 NOTE — Progress Notes (Signed)
EEG completed, results pending. 

## 2021-01-01 NOTE — Discharge Summary (Signed)
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 follow 2 step commands without difficulty.         Patient has been seen and examined prior to discharge   Objective   Blood pressure 135/75, pulse 76, temperature 98.4 F (36.9 C), temperature source Oral, resp. rate 18, height 5' (1.524 m), weight 59 kg, SpO2 91 %.   Intake/Output Summary (Last 24 hours) at 01/01/2021 1648 Last data filed at 01/01/2021 1249 Gross per 24 hour  Intake 92.01 ml  Output --  Net 92.01 ml   Exam Gen:- Awake Alert, no acute distress  HEENT:- [place].AT, No sclera icterus Neck-Supple Neck,No JVD,.  Lungs-  CTAB , good air movement bilaterally  CV- S1, S2 normal, regular Abd-  +ve B.Sounds, Abd Soft, No tenderness,    Extremity/Skin:- No  edema,   good pulses Psych-affect is appropriate, oriented x3- -Mental Status:  Alert, oriented, thought content appropriate, able to give a coherent  history. Speech fluent without evidence of aphasia. Able to follow 2 step commands without difficulty.  Neuro-generalized weakness, no new focal deficits, no tremors    Data Review   CBC w Diff:  Lab Results  Component Value Date   WBC 6.5 12/31/2020   HGB 13.9 12/31/2020   HGB 12.4 10/23/2020   HCT 41.0 12/31/2020   HCT 36.6 10/23/2020   PLT 181 12/31/2020   PLT 210 10/23/2020   LYMPHOPCT 23 12/31/2020   MONOPCT 11 12/31/2020   EOSPCT 3 12/31/2020   BASOPCT 1 12/31/2020   CMP:  Lab Results  Component Value Date   NA 142 12/31/2020   NA 141 10/23/2020   K 4.0 12/31/2020   CL 106 12/31/2020   CO2 26 12/31/2020   BUN 11 12/31/2020   BUN 30 (H) 10/23/2020   CREATININE 1.00 12/31/2020   PROT 7.1 12/31/2020   PROT 6.4 05/04/2020   ALBUMIN 4.2 12/31/2020   ALBUMIN 4.6 10/23/2020   BILITOT 0.7 12/31/2020   BILITOT 0.5 05/04/2020   ALKPHOS 85 12/31/2020   AST 15 12/31/2020   ALT 11 12/31/2020    Total Discharge time is about 33 minutes  Roxan Hockey M.D on 01/01/2021 at 4:48 PM  Go to [email or web address] -  for contact info  [facility] - Office  [phone]

## 2021-01-01 NOTE — ED Notes (Signed)
Patient has removed all VS monitoring equipment, is fully dressed and states that she is going home.

## 2021-01-01 NOTE — Care Management Obs Status (Signed)
[place] NOTIFICATION   Patient Details  Name: Amanda [name] MRN: [id number] Date of Birth: [birth year]   Medicare Observation Status Notification Given:  Yes    Natasha Bence, LCSW 01/01/2021, 4:01 PM
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
## 2021-01-01 NOTE — ED Notes (Signed)
Pt screaming stating she wants a real nurse and a real doctor that she has  "been in this hospital all night dammit"  hospitalist at bedside to reorient- failed. Pt attempting to use call light as cell phone.  RN to room - pt states she has questions that the hospitalist only answered one. Refuses to ask questions.

## 2021-01-01 NOTE — ED Notes (Signed)
SLP to ED to eval patient. Patient currently sitting up in bed feeding self breakfast. Asked to In/out cath patient per MD and she refuses. States that she will give sample after eating. Patient appears more alert and cooperative at this time. Son in for short visit after requested to come to ED and states that he is not staying due to being "hot."

## 2021-01-01 NOTE — TOC Initial Note (Signed)
Transition of Care [facility]) - Initial/Assessment Note    Patient Details  Name: Amanda [name] MRN: [id number] Date of Birth: [birth year]  [facility]) CM/SW Contact:    Natasha Bence, LCSW Phone Number: 01/01/2021, 4:28 PM  Clinical Narrative:                 Patient is a 87 year old female admitted for Altered Mental status. Patient is ambulatory at baseline. Patient's son agreeable to [facility]. CSW referred patient to [place] with [facility]. [place] agreeable to take patient. TOC to follow.   Expected Discharge Plan: [place] Barriers to Discharge: Continued Medical Work up   Patient Goals and CMS Choice Patient states their goals for this hospitalization and ongoing recovery are:: return home with [facility] CMS Medicare.gov Compare Post Acute Care list provided to:: Patient Choice offered to / list presented to : Patient  Expected Discharge Plan and Services Expected Discharge Plan: [place]       Living arrangements for the past 2 months: Single Family Home                                      Prior Living Arrangements/Services Living arrangements for the past 2 months: Single Family Home Lives with:: Self, Adult Children Patient language and need for interpreter reviewed:: Yes Do you feel safe going back to the place where you live?: Yes      Need for Family Participation in Patient Care: Yes (Comment) Care giver support system in place?: Yes (comment)   Criminal Activity/Legal Involvement Pertinent to Current Situation/Hospitalization: No - Comment as needed  Activities of Daily Living Home Assistive Devices/Equipment: Walker (specify type), Dentures (specify type) ADL Screening (condition at time of admission) Patient's cognitive ability adequate to safely complete daily activities?: Yes Is the patient deaf or have difficulty hearing?: No Does the patient have difficulty seeing, even when wearing glasses/contacts?: No Does the  patient have difficulty concentrating, remembering, or making decisions?: No Patient able to express need for assistance with ADLs?: Yes Does the patient have difficulty dressing or bathing?: No Independently performs ADLs?: Yes (appropriate for developmental age) Does the patient have difficulty walking or climbing stairs?: No Weakness of Legs: Left  Permission Sought/Granted Permission sought to share information with : Facility Art therapist granted to share information with : Yes, Verbal Permission Granted  Share Information with NAME: Hiley,Jay  Permission granted to share info w AGENCY: Local [facility]  Permission granted to share info w Relationship: Son  Permission granted to share info w Contact Information: [phone]  Emotional Assessment     Affect (typically observed): Accepting, Adaptable Orientation: : Oriented to Self, Oriented to Place, Oriented to  Time, Oriented to Situation Alcohol / Substance Use: Not Applicable Psych Involvement: No (comment)  Admission diagnosis:  AMS (altered mental status) [R41.82] Patient Active Problem List   Diagnosis Date Noted   AMS (altered mental status) 12/31/2020   Dysphagia 07/28/2020   Abdominal pain 07/28/2020   Recurrent UTI 03/19/2020   Leg pain, posterior, right 10/08/2019   OAB (overactive bladder) 09/18/2019   Chronic cystitis with hematuria 06/19/2019   Hematochezia 02/13/2019   Melena 02/13/2019   Anemia 02/13/2019   Constipation 02/13/2019   Absolute anemia 01/31/2019   Disorder of phosphorus metabolism 01/31/2019   CKD (chronic kidney disease) stage 3, GFR 30-59 ml/min (HCC) 07/20/2018  Postural dizziness 04/23/2018   Osteoporosis 01/15/2018   Transient cerebral ischemia 04/10/2013   Hyperlipidemia 09/22/2008   Essential hypertension 09/22/2008   Coronary atherosclerosis 09/22/2008   History of CVA (cerebrovascular accident) 09/22/2008   GERD 09/22/2008   Diverticulosis of colon 09/22/2008    Cerebral infarction due to unspecified occlusion or stenosis of unspecified cerebral artery ([place]) 09/22/2008   PCP:  [name], DO Pharmacy:   [facility] ([facility]) - [address] [place] [address] Phone: [phone] Fax: [phone]  [place], [place] [address] Phone: [phone] Fax: [phone]     Social Determinants of Health (SDOH) Interventions    Readmission Risk Interventions No flowsheet data found.

## 2021-01-01 NOTE — ED Notes (Signed)
Patient remains confused. Trying to get out of the bed to go home. Attempts to reorient failed. assisted back up the bed multiple times. Able to move all extremities. attempts to hit staff  Remains oriented to person and place.

## 2021-01-01 NOTE — ED Notes (Signed)
EEG being completed at this time.

## 2021-01-01 NOTE — Progress Notes (Signed)
  Echocardiogram 2D Echocardiogram has been performed.  Amanda [name] 01/01/2021, 10:06 AM

## 2021-01-01 NOTE — Plan of Care (Signed)
  Problem: Acute Rehab PT Goals(only PT should resolve) Goal: Pt Will Go Supine/Side To Sit Outcome: Progressing Flowsheets (Taken 01/01/2021 1224) Pt will go Supine/Side to Sit:  Independently  with modified independence Goal: Patient Will Transfer Sit To/From Stand Outcome: Progressing Flowsheets (Taken 01/01/2021 1224) Patient will transfer sit to/from stand:  with modified independence  with supervision Goal: Pt Will Transfer Bed To Chair/Chair To Bed Outcome: Progressing Flowsheets (Taken 01/01/2021 1224) Pt will Transfer Bed to Chair/Chair to Bed:  with modified independence  with supervision Goal: Pt Will Ambulate Outcome: Progressing Flowsheets (Taken 01/01/2021 1224) Pt will Ambulate:  > 125 feet  with modified independence  with supervision  with rolling walker  with cane   12:25 PM, 01/01/21 Lonell Grandchild, MPT Physical Therapist with [facility] [phone] office [phone] mobile phone

## 2021-01-01 NOTE — ED Notes (Signed)
Called son and requested that he come sit with patient due to her frequently getting out of bed/AMS.
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
## 2021-01-01 NOTE — ED Notes (Signed)
Pt removed leads. And gown. Put own shirt back on. Refused to change back into gown.

## 2021-01-01 NOTE — ED Notes (Signed)
Assisted pt to restroom at this time with a wheel chair without any incident . Amanda [name]

## 2021-01-01 NOTE — Procedures (Signed)
Patient Name: Amanda [name]  MRN: [id number]  Epilepsy Attending: Lora Havens  Referring Physician/Provider: Dr Roxan Hockey, Date: 01/01/2021 Duration: 24.29 mins  Patient history: 87yo F with ams. EEG to evaluate for seizure  Level of alertness: Awake  AEDs during EEG study: None  Technical aspects: This EEG study was done with scalp electrodes positioned according to the 10-20 International system of electrode placement. Electrical activity was acquired at a sampling rate of '500Hz'$  and reviewed with a high frequency filter of '70Hz'$  and a low frequency filter of '1Hz'$ . EEG data were recorded continuously and digitally stored.   Description: The posterior dominant rhythm consists of 8 Hz activity of moderate voltage (25-35 uV) seen predominantly in posterior head regions, symmetric and reactive to eye opening and eye closing. Hyperventilation and photic stimulation were not performed.     IMPRESSION: This study is within normal limits. No seizures or epileptiform discharges were seen throughout the recording.  Paula Busenbark Barbra Sarks

## 2021-01-04 ENCOUNTER — Telehealth: Payer: Self-pay

## 2021-01-04 DIAGNOSIS — N183 Chronic kidney disease, stage 3 unspecified: Secondary | ICD-10-CM | POA: Diagnosis not present

## 2021-01-04 DIAGNOSIS — I129 Hypertensive chronic kidney disease with stage 1 through stage 4 chronic kidney disease, or unspecified chronic kidney disease: Secondary | ICD-10-CM | POA: Diagnosis not present

## 2021-01-04 DIAGNOSIS — G9340 Encephalopathy, unspecified: Secondary | ICD-10-CM | POA: Diagnosis not present

## 2021-01-04 DIAGNOSIS — E785 Hyperlipidemia, unspecified: Secondary | ICD-10-CM | POA: Diagnosis not present

## 2021-01-04 DIAGNOSIS — D631 Anemia in chronic kidney disease: Secondary | ICD-10-CM | POA: Diagnosis not present

## 2021-01-04 DIAGNOSIS — N390 Urinary tract infection, site not specified: Secondary | ICD-10-CM | POA: Diagnosis not present

## 2021-01-04 DIAGNOSIS — N3281 Overactive bladder: Secondary | ICD-10-CM | POA: Diagnosis not present

## 2021-01-04 DIAGNOSIS — M47812 Spondylosis without myelopathy or radiculopathy, cervical region: Secondary | ICD-10-CM | POA: Diagnosis not present

## 2021-01-04 DIAGNOSIS — M810 Age-related osteoporosis without current pathological fracture: Secondary | ICD-10-CM | POA: Diagnosis not present

## 2021-01-04 DIAGNOSIS — I2510 Atherosclerotic heart disease of native coronary artery without angina pectoris: Secondary | ICD-10-CM | POA: Diagnosis not present

## 2021-01-04 NOTE — Progress Notes (Addendum)
Subjective: CC: Transition of care/hospital discharge PCP: [name], DO [id number] T Radwanski is a 87 y.o. female presenting to clinic today for:  1.  Hospital discharge for strokelike symptoms Patient was discharged on 01/01/2021 from [facility] after having been admitted for strokelike symptoms/altered mental status.  She was found to have urinary tract infection and placed on Keflex.  There was concern for possible underlying dementia.  She had mild to moderate cerebral atrophy on MRI no evidence of acute stroke.  She had chronic lacunar infarcts noted and some stenosis of some of the small vessels in the brain.  Outpatient physical therapy was recommended for balance improvement.  She is worried because there was mention of dementia.  She notes that she has discontinued driving at the behest of her family and the hospitalists.  She denies any dysuria, hematuria, discoloration to the urine or urine odors.  She also notes that she did not have any urinary tract infection symptoms prior to admission.  She continues to work with home health physical therapy and is enjoying that.  She has completed her Keflex.  Has not reached out to Dr. Alyson Ingles yet.  Has questions about several of her medications which were stopped during the hospitalization.  She has not been on any of her blood pressure medication since discharge and is asking which ones to resume.   ROS: Per HPI  Allergies  Allergen Reactions   Livalo [Pitavastatin] Other (See Comments)    Causes dizziness   Simvastatin Other (See Comments)    Causes dizziness   Lisinopril     Hallucinations, resolved on ARB   Past Medical History:  Diagnosis Date   Allergy    Anxiety    Coronary artery disease    LAD 30% followed by 60% stenosis; pressure wire measurement demonstrated no significant gradient; circumflex had 30% stenosis, right coronary artery has 40% stenosed; EF 75-80%   Diverticulosis    GERD (gastroesophageal reflux  disease)    Hematuria    Hyperlipidemia    Hypertension    Impaired renal function 04/23/2018   Stroke ([facility]) 2003   With a left MCA occlusion   TIA (transient ischemic attack)     Current Outpatient Medications:    aspirin EC 81 MG tablet, Take 1 tablet (81 mg total) by mouth daily with breakfast. Swallow whole., Disp: 30 tablet, Rfl: 5   atorvastatin (LIPITOR) 20 MG tablet, TAKE 1 TABLET AT BEDTIME, Disp: 90 tablet, Rfl: 0   Cholecalciferol 25 MCG (1000 UT) capsule, Take 1,000 Units by mouth daily., Disp: , Rfl:    iron polysaccharides (NIFEREX) 150 MG capsule, Take 150 mg by mouth daily., Disp: , Rfl:    loratadine (CLARITIN) 10 MG tablet, TAKE 1 TABLET EVERY DAY, Disp: 90 tablet, Rfl: 1   Multiple Vitamins-Minerals (PRESERVISION AREDS 2) CAPS, Take 1 capsule by mouth 2 (two) times a day., Disp: 180 capsule, Rfl: 2   omeprazole (PRILOSEC) 20 MG capsule, TAKE 1 CAPSULE (20 MG TOTAL) BY MOUTH 2 (TWO) TIMES DAILY BEFORE A MEAL., Disp: 180 capsule, Rfl: 3   solifenacin (VESICARE) 5 MG tablet, Take 1 tablet (5 mg total) by mouth daily., Disp: 90 tablet, Rfl: 3   verapamil (CALAN-SR) 180 MG CR tablet, TAKE 1 TABLET AT BEDTIME (REPLACES '120MG'$ ). (Patient taking differently: Take 180 mg by mouth at bedtime. TAKE 1 TABLET AT BEDTIME (REPLACES '120MG'$ ).), Disp: 90 tablet, Rfl: 0   VITAMIN D PO, 1,000 Int'l Units/day., Disp: , Rfl:  Social History   Socioeconomic History   Marital status: Widowed    Spouse name: Not on file   Number of children: 2   Years of education: 12   Highest education level: High school graduate  Occupational History   Occupation: Horticulturist, commercial: RETIRED    Comment: Retired  Tobacco Use   Smoking status: Former    Packs/day: 0.25    Years: 3.00    Pack years: 0.75    Types: Cigarettes    Quit date: 06/06/1985    Years since quitting: 35.6   Smokeless tobacco: Never   Tobacco comments:    Approximately 10-pack-year history  Vaping Use   Vaping Use: Never used   Substance and Sexual Activity   Alcohol use: No   Drug use: No   Sexual activity: Not Currently  Other Topics Concern   Not on file  Social History Narrative   Lives in [place] with her son.   Has been widowed for about 3 years.   Social Determinants of Health   Financial Resource Strain: Not on file  Food Insecurity: Not on file  Transportation Needs: Not on file  Physical Activity: Not on file  Stress: Not on file  Social Connections: Not on file  Intimate Partner Violence: Not on file   Family History  Problem Relation Age of Onset   Colon cancer Mother 54       dx on colonoscopy    Cancer Mother        colon   Cancer Father        unsure if cancer  --- tumor on brain   Heart attack Sister    Heart attack Brother    Diabetes Brother    GI problems Son    COPD Sister    Hypertension Brother    Heart disease Brother    Heart attack Brother    Heart disease Brother    Alcohol abuse Brother     Objective: Office vital signs reviewed. BP (!) 154/81   Pulse 83   Temp 97.9 F (36.6 C)   Ht 5' (1.524 m)   Wt 124 lb 6.4 oz (56.4 kg)   SpO2 95%   BMI 24.30 kg/m   Physical Examination:  General: Awake, alert, No acute distress HEENT: Normal; sclera white Cardio: regular rate and rhythm, S1S2 heard, no murmurs appreciated Pulm: clear to auscultation bilaterally, no wheezes, rhonchi or rales; normal work of breathing on room air MSK: Unstable balance.  Utilizes cane for ambulation Neuro: Seems a bit flustered during her visit today MMSE - Mini Mental State Exam 01/12/2021 08/07/2017  Orientation to time 5 5  Orientation to Place 5 5  Registration 3 3  Attention/ Calculation 5 5  Recall 1 3  Language- name 2 objects 2 2  Language- repeat 0 1  Language- follow 3 step command 1 3  Language- read & follow direction 1 1  Write a sentence 1 1  Copy design 0 1  Total score 24 30   Assessment/ Plan: 87 y.o. female   Stroke-like symptom  Hospital discharge  follow-up  Recent urinary tract infection  Cerebral atrophy (HCC)  Abnormal MMSE  I reviewed her hospital summary and recommendations.  She is actively in home health physical therapy.  She is had balance issue for a while now and I think that this is very appropriate.  We discussed the findings on her MRI and CT scan.  Cerebral atrophy is noted and  we discussed that this can correlate with dementia.  Her MMSE was borderline today at 24 and we discussed that this would be consistent with a mild dementia but I favor rechecking this at her next visit to ensure that this is not residual finding from her recent altered mental status in the setting of urinary tract infection.  She was agreeable.  Advised to follow-up with urologist.  Her prophylactic antibiotic was discontinued during hospitalization.  Wonder if she might benefit from Keflex as a prophylactic instead.  She was not able to void for repeat urinalysis today but will hold her urine in efforts to urinate for Dr. Alyson Ingles.  She will resume verapamil.  ARB's have been discontinued.  No orders of the defined types were placed in this encounter.  No orders of the defined types were placed in this encounter.   Today's visit is for Transitional Care Management.  The patient was discharged from [facility] on 01/01/21 with a primary diagnosis of Stroke like symptoms, UTI.   Contact with the patient and/or caregiver, by a clinical staff member, was made on 01/01/21 and was documented as a telephone encounter within the EMR.  Through chart review and discussion with the patient I have determined that management of their condition is of moderate complexity.    [name], DO [place] [phone]

## 2021-01-04 NOTE — Telephone Encounter (Signed)
Transition Care Management Follow-up Telephone Call Date of discharge and from where: 01/01/21 - [facility] Diagnosis: stroke like symptoms How have you been since you were released from the hospital? She feels fine now Any questions or concerns? No  Items Reviewed: Did the pt receive and understand the discharge instructions provided? Yes  Medications obtained and verified? Yes  Other? No  Any new allergies since your discharge? No  Dietary orders reviewed? Yes Do you have support at home? Yes   Home Care and Equipment/Supplies: Were home health services ordered? She is not sure - they mentioned it, but she hasn't heard anything from them If so, what is the name of the agency? N/a  Has the agency set up a time to come to the patient's home? no Were any new equipment or medical supplies ordered?  No What is the name of the medical supply agency? N/a Were you able to get the supplies/equipment? no Do you have any questions related to the use of the equipment or supplies? No  Functional Questionnaire: (I = Independent and D = Dependent) ADLs: I  Bathing/Dressing- I  Meal Prep- I  Eating- I  Maintaining continence- I  Transferring/Ambulation- I  Managing Meds- I  Follow up appointments reviewed:  PCP Hospital f/u appt confirmed? Yes  Scheduled to see Lajuana Ripple on 8/9 @ 2. [facility] f/u appt confirmed? No   Are transportation arrangements needed? No  If their condition worsens, is the pt aware to call PCP or go to the Emergency Dept.? Yes Was the patient provided with contact information for the PCP's office or ED? Yes Was to pt encouraged to call back with questions or concerns? Yes

## 2021-01-06 DIAGNOSIS — I2510 Atherosclerotic heart disease of native coronary artery without angina pectoris: Secondary | ICD-10-CM | POA: Diagnosis not present

## 2021-01-06 DIAGNOSIS — M810 Age-related osteoporosis without current pathological fracture: Secondary | ICD-10-CM | POA: Diagnosis not present

## 2021-01-06 DIAGNOSIS — I129 Hypertensive chronic kidney disease with stage 1 through stage 4 chronic kidney disease, or unspecified chronic kidney disease: Secondary | ICD-10-CM | POA: Diagnosis not present

## 2021-01-06 DIAGNOSIS — E785 Hyperlipidemia, unspecified: Secondary | ICD-10-CM | POA: Diagnosis not present

## 2021-01-06 DIAGNOSIS — N183 Chronic kidney disease, stage 3 unspecified: Secondary | ICD-10-CM | POA: Diagnosis not present

## 2021-01-06 DIAGNOSIS — M47812 Spondylosis without myelopathy or radiculopathy, cervical region: Secondary | ICD-10-CM | POA: Diagnosis not present

## 2021-01-06 DIAGNOSIS — D631 Anemia in chronic kidney disease: Secondary | ICD-10-CM | POA: Diagnosis not present

## 2021-01-06 DIAGNOSIS — N3281 Overactive bladder: Secondary | ICD-10-CM | POA: Diagnosis not present

## 2021-01-06 DIAGNOSIS — G9340 Encephalopathy, unspecified: Secondary | ICD-10-CM | POA: Diagnosis not present

## 2021-01-06 DIAGNOSIS — N390 Urinary tract infection, site not specified: Secondary | ICD-10-CM | POA: Diagnosis not present

## 2021-01-11 DIAGNOSIS — M810 Age-related osteoporosis without current pathological fracture: Secondary | ICD-10-CM | POA: Diagnosis not present

## 2021-01-11 DIAGNOSIS — M47812 Spondylosis without myelopathy or radiculopathy, cervical region: Secondary | ICD-10-CM | POA: Diagnosis not present

## 2021-01-11 DIAGNOSIS — N390 Urinary tract infection, site not specified: Secondary | ICD-10-CM | POA: Diagnosis not present

## 2021-01-11 DIAGNOSIS — I2510 Atherosclerotic heart disease of native coronary artery without angina pectoris: Secondary | ICD-10-CM | POA: Diagnosis not present

## 2021-01-11 DIAGNOSIS — D631 Anemia in chronic kidney disease: Secondary | ICD-10-CM | POA: Diagnosis not present

## 2021-01-11 DIAGNOSIS — N183 Chronic kidney disease, stage 3 unspecified: Secondary | ICD-10-CM | POA: Diagnosis not present

## 2021-01-11 DIAGNOSIS — N3281 Overactive bladder: Secondary | ICD-10-CM | POA: Diagnosis not present

## 2021-01-11 DIAGNOSIS — E785 Hyperlipidemia, unspecified: Secondary | ICD-10-CM | POA: Diagnosis not present

## 2021-01-11 DIAGNOSIS — G9340 Encephalopathy, unspecified: Secondary | ICD-10-CM | POA: Diagnosis not present

## 2021-01-11 DIAGNOSIS — I129 Hypertensive chronic kidney disease with stage 1 through stage 4 chronic kidney disease, or unspecified chronic kidney disease: Secondary | ICD-10-CM | POA: Diagnosis not present

## 2021-01-12 ENCOUNTER — Encounter: Payer: Self-pay | Admitting: Family Medicine

## 2021-01-12 ENCOUNTER — Other Ambulatory Visit: Payer: Self-pay

## 2021-01-12 ENCOUNTER — Ambulatory Visit (INDEPENDENT_AMBULATORY_CARE_PROVIDER_SITE_OTHER): Payer: Medicare HMO | Admitting: Family Medicine

## 2021-01-12 VITALS — BP 154/81 | HR 83 | Temp 97.9°F | Ht 60.0 in | Wt 124.4 lb

## 2021-01-12 DIAGNOSIS — G319 Degenerative disease of nervous system, unspecified: Secondary | ICD-10-CM | POA: Diagnosis not present

## 2021-01-12 DIAGNOSIS — R29898 Other symptoms and signs involving the musculoskeletal system: Secondary | ICD-10-CM

## 2021-01-12 DIAGNOSIS — M79604 Pain in right leg: Secondary | ICD-10-CM | POA: Diagnosis not present

## 2021-01-12 DIAGNOSIS — R69 Illness, unspecified: Secondary | ICD-10-CM | POA: Diagnosis not present

## 2021-01-12 DIAGNOSIS — Z09 Encounter for follow-up examination after completed treatment for conditions other than malignant neoplasm: Secondary | ICD-10-CM | POA: Diagnosis not present

## 2021-01-12 DIAGNOSIS — F99 Mental disorder, not otherwise specified: Secondary | ICD-10-CM

## 2021-01-12 DIAGNOSIS — R2990 Unspecified symptoms and signs involving the nervous system: Secondary | ICD-10-CM | POA: Diagnosis not present

## 2021-01-12 DIAGNOSIS — Z87440 Personal history of urinary (tract) infections: Secondary | ICD-10-CM | POA: Diagnosis not present

## 2021-01-12 NOTE — Patient Instructions (Signed)
We did a memory test on you today.  It was BORDERLINE for MILD dementia.  I'd like to repeat this at our visit in November.  Sometimes being ill can cause memory problems temporarily.  Call Dr Noland Fordyce office for a follow up.  You need to hold your urine before that visit so that they can test it.  TAKE the Verapamil for your blood pressure EVERY day.  It is too high today.

## 2021-01-13 DIAGNOSIS — G9340 Encephalopathy, unspecified: Secondary | ICD-10-CM | POA: Diagnosis not present

## 2021-01-13 DIAGNOSIS — E785 Hyperlipidemia, unspecified: Secondary | ICD-10-CM | POA: Diagnosis not present

## 2021-01-13 DIAGNOSIS — N3281 Overactive bladder: Secondary | ICD-10-CM | POA: Diagnosis not present

## 2021-01-13 DIAGNOSIS — N183 Chronic kidney disease, stage 3 unspecified: Secondary | ICD-10-CM | POA: Diagnosis not present

## 2021-01-13 DIAGNOSIS — N390 Urinary tract infection, site not specified: Secondary | ICD-10-CM | POA: Diagnosis not present

## 2021-01-13 DIAGNOSIS — D631 Anemia in chronic kidney disease: Secondary | ICD-10-CM | POA: Diagnosis not present

## 2021-01-13 DIAGNOSIS — M810 Age-related osteoporosis without current pathological fracture: Secondary | ICD-10-CM | POA: Diagnosis not present

## 2021-01-13 DIAGNOSIS — M47812 Spondylosis without myelopathy or radiculopathy, cervical region: Secondary | ICD-10-CM | POA: Diagnosis not present

## 2021-01-13 DIAGNOSIS — I129 Hypertensive chronic kidney disease with stage 1 through stage 4 chronic kidney disease, or unspecified chronic kidney disease: Secondary | ICD-10-CM | POA: Diagnosis not present

## 2021-01-13 DIAGNOSIS — I2510 Atherosclerotic heart disease of native coronary artery without angina pectoris: Secondary | ICD-10-CM | POA: Diagnosis not present

## 2021-01-15 ENCOUNTER — Other Ambulatory Visit: Payer: Self-pay

## 2021-01-15 ENCOUNTER — Ambulatory Visit (INDEPENDENT_AMBULATORY_CARE_PROVIDER_SITE_OTHER): Payer: Medicare HMO

## 2021-01-15 DIAGNOSIS — M810 Age-related osteoporosis without current pathological fracture: Secondary | ICD-10-CM | POA: Diagnosis not present

## 2021-01-15 DIAGNOSIS — Z9181 History of falling: Secondary | ICD-10-CM

## 2021-01-15 DIAGNOSIS — M47812 Spondylosis without myelopathy or radiculopathy, cervical region: Secondary | ICD-10-CM | POA: Diagnosis not present

## 2021-01-15 DIAGNOSIS — I129 Hypertensive chronic kidney disease with stage 1 through stage 4 chronic kidney disease, or unspecified chronic kidney disease: Secondary | ICD-10-CM | POA: Diagnosis not present

## 2021-01-15 DIAGNOSIS — D631 Anemia in chronic kidney disease: Secondary | ICD-10-CM

## 2021-01-15 DIAGNOSIS — N183 Chronic kidney disease, stage 3 unspecified: Secondary | ICD-10-CM

## 2021-01-15 DIAGNOSIS — N3281 Overactive bladder: Secondary | ICD-10-CM | POA: Diagnosis not present

## 2021-01-15 DIAGNOSIS — K219 Gastro-esophageal reflux disease without esophagitis: Secondary | ICD-10-CM

## 2021-01-15 DIAGNOSIS — E785 Hyperlipidemia, unspecified: Secondary | ICD-10-CM

## 2021-01-15 DIAGNOSIS — G319 Degenerative disease of nervous system, unspecified: Secondary | ICD-10-CM

## 2021-01-15 DIAGNOSIS — I2510 Atherosclerotic heart disease of native coronary artery without angina pectoris: Secondary | ICD-10-CM

## 2021-01-15 DIAGNOSIS — N390 Urinary tract infection, site not specified: Secondary | ICD-10-CM

## 2021-01-15 DIAGNOSIS — Z87891 Personal history of nicotine dependence: Secondary | ICD-10-CM

## 2021-01-15 DIAGNOSIS — Z86010 Personal history of colonic polyps: Secondary | ICD-10-CM

## 2021-01-15 DIAGNOSIS — K5790 Diverticulosis of intestine, part unspecified, without perforation or abscess without bleeding: Secondary | ICD-10-CM

## 2021-01-15 DIAGNOSIS — F419 Anxiety disorder, unspecified: Secondary | ICD-10-CM

## 2021-01-15 DIAGNOSIS — G9340 Encephalopathy, unspecified: Secondary | ICD-10-CM

## 2021-01-15 DIAGNOSIS — Z8673 Personal history of transient ischemic attack (TIA), and cerebral infarction without residual deficits: Secondary | ICD-10-CM

## 2021-01-15 DIAGNOSIS — Z7982 Long term (current) use of aspirin: Secondary | ICD-10-CM

## 2021-01-18 DIAGNOSIS — N3281 Overactive bladder: Secondary | ICD-10-CM | POA: Diagnosis not present

## 2021-01-18 DIAGNOSIS — D631 Anemia in chronic kidney disease: Secondary | ICD-10-CM | POA: Diagnosis not present

## 2021-01-18 DIAGNOSIS — I2510 Atherosclerotic heart disease of native coronary artery without angina pectoris: Secondary | ICD-10-CM | POA: Diagnosis not present

## 2021-01-18 DIAGNOSIS — G9340 Encephalopathy, unspecified: Secondary | ICD-10-CM | POA: Diagnosis not present

## 2021-01-18 DIAGNOSIS — N390 Urinary tract infection, site not specified: Secondary | ICD-10-CM | POA: Diagnosis not present

## 2021-01-18 DIAGNOSIS — M47812 Spondylosis without myelopathy or radiculopathy, cervical region: Secondary | ICD-10-CM | POA: Diagnosis not present

## 2021-01-18 DIAGNOSIS — E785 Hyperlipidemia, unspecified: Secondary | ICD-10-CM | POA: Diagnosis not present

## 2021-01-18 DIAGNOSIS — I129 Hypertensive chronic kidney disease with stage 1 through stage 4 chronic kidney disease, or unspecified chronic kidney disease: Secondary | ICD-10-CM | POA: Diagnosis not present

## 2021-01-18 DIAGNOSIS — M810 Age-related osteoporosis without current pathological fracture: Secondary | ICD-10-CM | POA: Diagnosis not present

## 2021-01-18 DIAGNOSIS — N183 Chronic kidney disease, stage 3 unspecified: Secondary | ICD-10-CM | POA: Diagnosis not present

## 2021-01-20 DIAGNOSIS — I2510 Atherosclerotic heart disease of native coronary artery without angina pectoris: Secondary | ICD-10-CM | POA: Diagnosis not present

## 2021-01-20 DIAGNOSIS — N183 Chronic kidney disease, stage 3 unspecified: Secondary | ICD-10-CM | POA: Diagnosis not present

## 2021-01-20 DIAGNOSIS — N390 Urinary tract infection, site not specified: Secondary | ICD-10-CM | POA: Diagnosis not present

## 2021-01-20 DIAGNOSIS — I129 Hypertensive chronic kidney disease with stage 1 through stage 4 chronic kidney disease, or unspecified chronic kidney disease: Secondary | ICD-10-CM | POA: Diagnosis not present

## 2021-01-20 DIAGNOSIS — E785 Hyperlipidemia, unspecified: Secondary | ICD-10-CM | POA: Diagnosis not present

## 2021-01-20 DIAGNOSIS — M47812 Spondylosis without myelopathy or radiculopathy, cervical region: Secondary | ICD-10-CM | POA: Diagnosis not present

## 2021-01-20 DIAGNOSIS — D631 Anemia in chronic kidney disease: Secondary | ICD-10-CM | POA: Diagnosis not present

## 2021-01-20 DIAGNOSIS — M810 Age-related osteoporosis without current pathological fracture: Secondary | ICD-10-CM | POA: Diagnosis not present

## 2021-01-20 DIAGNOSIS — G9340 Encephalopathy, unspecified: Secondary | ICD-10-CM | POA: Diagnosis not present

## 2021-01-20 DIAGNOSIS — N3281 Overactive bladder: Secondary | ICD-10-CM | POA: Diagnosis not present

## 2021-01-25 DIAGNOSIS — E785 Hyperlipidemia, unspecified: Secondary | ICD-10-CM | POA: Diagnosis not present

## 2021-01-25 DIAGNOSIS — G9340 Encephalopathy, unspecified: Secondary | ICD-10-CM | POA: Diagnosis not present

## 2021-01-25 DIAGNOSIS — N3281 Overactive bladder: Secondary | ICD-10-CM | POA: Diagnosis not present

## 2021-01-25 DIAGNOSIS — N183 Chronic kidney disease, stage 3 unspecified: Secondary | ICD-10-CM | POA: Diagnosis not present

## 2021-01-25 DIAGNOSIS — M810 Age-related osteoporosis without current pathological fracture: Secondary | ICD-10-CM | POA: Diagnosis not present

## 2021-01-25 DIAGNOSIS — M47812 Spondylosis without myelopathy or radiculopathy, cervical region: Secondary | ICD-10-CM | POA: Diagnosis not present

## 2021-01-25 DIAGNOSIS — D631 Anemia in chronic kidney disease: Secondary | ICD-10-CM | POA: Diagnosis not present

## 2021-01-25 DIAGNOSIS — I129 Hypertensive chronic kidney disease with stage 1 through stage 4 chronic kidney disease, or unspecified chronic kidney disease: Secondary | ICD-10-CM | POA: Diagnosis not present

## 2021-01-25 DIAGNOSIS — N390 Urinary tract infection, site not specified: Secondary | ICD-10-CM | POA: Diagnosis not present

## 2021-01-25 DIAGNOSIS — I2510 Atherosclerotic heart disease of native coronary artery without angina pectoris: Secondary | ICD-10-CM | POA: Diagnosis not present

## 2021-01-27 ENCOUNTER — Telehealth: Payer: Self-pay | Admitting: *Deleted

## 2021-01-27 DIAGNOSIS — I129 Hypertensive chronic kidney disease with stage 1 through stage 4 chronic kidney disease, or unspecified chronic kidney disease: Secondary | ICD-10-CM | POA: Diagnosis not present

## 2021-01-27 DIAGNOSIS — N390 Urinary tract infection, site not specified: Secondary | ICD-10-CM | POA: Diagnosis not present

## 2021-01-27 DIAGNOSIS — G9340 Encephalopathy, unspecified: Secondary | ICD-10-CM | POA: Diagnosis not present

## 2021-01-27 DIAGNOSIS — N183 Chronic kidney disease, stage 3 unspecified: Secondary | ICD-10-CM | POA: Diagnosis not present

## 2021-01-27 DIAGNOSIS — M47812 Spondylosis without myelopathy or radiculopathy, cervical region: Secondary | ICD-10-CM | POA: Diagnosis not present

## 2021-01-27 DIAGNOSIS — I2510 Atherosclerotic heart disease of native coronary artery without angina pectoris: Secondary | ICD-10-CM | POA: Diagnosis not present

## 2021-01-27 DIAGNOSIS — D631 Anemia in chronic kidney disease: Secondary | ICD-10-CM | POA: Diagnosis not present

## 2021-01-27 DIAGNOSIS — M810 Age-related osteoporosis without current pathological fracture: Secondary | ICD-10-CM | POA: Diagnosis not present

## 2021-01-27 DIAGNOSIS — E785 Hyperlipidemia, unspecified: Secondary | ICD-10-CM | POA: Diagnosis not present

## 2021-01-27 DIAGNOSIS — N3281 Overactive bladder: Secondary | ICD-10-CM | POA: Diagnosis not present

## 2021-01-27 NOTE — Telephone Encounter (Signed)
FYI: VM from [facility] w/ [facility] Saw pt today, reporting pt had a fall last pm about 11 while going to bathroom, she lost her balance when turning around and missed the toilet. Has bruising right mid back around bra area, pain 2/10, no pain during breathing or coughing, just sore. Pt was instructed to use her walker at night or hold onto bathroom sink while turning around.

## 2021-02-01 DIAGNOSIS — N3281 Overactive bladder: Secondary | ICD-10-CM | POA: Diagnosis not present

## 2021-02-01 DIAGNOSIS — N183 Chronic kidney disease, stage 3 unspecified: Secondary | ICD-10-CM | POA: Diagnosis not present

## 2021-02-01 DIAGNOSIS — D631 Anemia in chronic kidney disease: Secondary | ICD-10-CM | POA: Diagnosis not present

## 2021-02-01 DIAGNOSIS — I129 Hypertensive chronic kidney disease with stage 1 through stage 4 chronic kidney disease, or unspecified chronic kidney disease: Secondary | ICD-10-CM | POA: Diagnosis not present

## 2021-02-01 DIAGNOSIS — M47812 Spondylosis without myelopathy or radiculopathy, cervical region: Secondary | ICD-10-CM | POA: Diagnosis not present

## 2021-02-01 DIAGNOSIS — M810 Age-related osteoporosis without current pathological fracture: Secondary | ICD-10-CM | POA: Diagnosis not present

## 2021-02-01 DIAGNOSIS — E785 Hyperlipidemia, unspecified: Secondary | ICD-10-CM | POA: Diagnosis not present

## 2021-02-01 DIAGNOSIS — G9340 Encephalopathy, unspecified: Secondary | ICD-10-CM | POA: Diagnosis not present

## 2021-02-01 DIAGNOSIS — I2510 Atherosclerotic heart disease of native coronary artery without angina pectoris: Secondary | ICD-10-CM | POA: Diagnosis not present

## 2021-02-01 DIAGNOSIS — N390 Urinary tract infection, site not specified: Secondary | ICD-10-CM | POA: Diagnosis not present

## 2021-03-11 ENCOUNTER — Observation Stay (HOSPITAL_COMMUNITY): Payer: Medicare HMO

## 2021-03-11 ENCOUNTER — Other Ambulatory Visit: Payer: Self-pay

## 2021-03-11 ENCOUNTER — Emergency Department (HOSPITAL_COMMUNITY): Payer: Medicare HMO

## 2021-03-11 ENCOUNTER — Telehealth: Payer: Self-pay | Admitting: *Deleted

## 2021-03-11 ENCOUNTER — Inpatient Hospital Stay (HOSPITAL_COMMUNITY)
Admission: EM | Admit: 2021-03-11 | Discharge: 2021-03-29 | DRG: 086 | Disposition: A | Discharge status: Skilled Nursing Facility | Payer: Medicare HMO | Attending: Internal Medicine | Admitting: Internal Medicine

## 2021-03-11 DIAGNOSIS — S066X0A Traumatic subarachnoid hemorrhage without loss of consciousness, initial encounter: Principal | ICD-10-CM | POA: Diagnosis present

## 2021-03-11 DIAGNOSIS — Y92009 Unspecified place in unspecified non-institutional (private) residence as the place of occurrence of the external cause: Secondary | ICD-10-CM | POA: Diagnosis not present

## 2021-03-11 DIAGNOSIS — Z79899 Other long term (current) drug therapy: Secondary | ICD-10-CM

## 2021-03-11 DIAGNOSIS — R69 Illness, unspecified: Secondary | ICD-10-CM | POA: Diagnosis not present

## 2021-03-11 DIAGNOSIS — K219 Gastro-esophageal reflux disease without esophagitis: Secondary | ICD-10-CM

## 2021-03-11 DIAGNOSIS — Z66 Do not resuscitate: Secondary | ICD-10-CM | POA: Diagnosis not present

## 2021-03-11 DIAGNOSIS — Z8249 Family history of ischemic heart disease and other diseases of the circulatory system: Secondary | ICD-10-CM | POA: Diagnosis not present

## 2021-03-11 DIAGNOSIS — G319 Degenerative disease of nervous system, unspecified: Secondary | ICD-10-CM

## 2021-03-11 DIAGNOSIS — Z90710 Acquired absence of both cervix and uterus: Secondary | ICD-10-CM | POA: Diagnosis not present

## 2021-03-11 DIAGNOSIS — Z9181 History of falling: Secondary | ICD-10-CM | POA: Diagnosis not present

## 2021-03-11 DIAGNOSIS — Z7982 Long term (current) use of aspirin: Secondary | ICD-10-CM

## 2021-03-11 DIAGNOSIS — F03918 Unspecified dementia, unspecified severity, with other behavioral disturbance: Secondary | ICD-10-CM | POA: Diagnosis not present

## 2021-03-11 DIAGNOSIS — I609 Nontraumatic subarachnoid hemorrhage, unspecified: Secondary | ICD-10-CM

## 2021-03-11 DIAGNOSIS — J302 Other seasonal allergic rhinitis: Secondary | ICD-10-CM

## 2021-03-11 DIAGNOSIS — Z20822 Contact with and (suspected) exposure to covid-19: Secondary | ICD-10-CM | POA: Diagnosis not present

## 2021-03-11 DIAGNOSIS — S066XAA Traumatic subarachnoid hemorrhage with loss of consciousness status unknown, initial encounter: Secondary | ICD-10-CM | POA: Diagnosis present

## 2021-03-11 DIAGNOSIS — S065X0A Traumatic subdural hemorrhage without loss of consciousness, initial encounter: Secondary | ICD-10-CM | POA: Diagnosis not present

## 2021-03-11 DIAGNOSIS — S0993XA Unspecified injury of face, initial encounter: Secondary | ICD-10-CM | POA: Diagnosis not present

## 2021-03-11 DIAGNOSIS — I5032 Chronic diastolic (congestive) heart failure: Secondary | ICD-10-CM | POA: Diagnosis present

## 2021-03-11 DIAGNOSIS — Z9049 Acquired absence of other specified parts of digestive tract: Secondary | ICD-10-CM | POA: Diagnosis not present

## 2021-03-11 DIAGNOSIS — Z888 Allergy status to other drugs, medicaments and biological substances status: Secondary | ICD-10-CM | POA: Diagnosis not present

## 2021-03-11 DIAGNOSIS — R402362 Coma scale, best motor response, obeys commands, at arrival to emergency department: Secondary | ICD-10-CM | POA: Diagnosis not present

## 2021-03-11 DIAGNOSIS — I110 Hypertensive heart disease with heart failure: Secondary | ICD-10-CM | POA: Diagnosis present

## 2021-03-11 DIAGNOSIS — I10 Essential (primary) hypertension: Secondary | ICD-10-CM

## 2021-03-11 DIAGNOSIS — K5900 Constipation, unspecified: Secondary | ICD-10-CM

## 2021-03-11 DIAGNOSIS — R402242 Coma scale, best verbal response, confused conversation, at arrival to emergency department: Secondary | ICD-10-CM | POA: Diagnosis present

## 2021-03-11 DIAGNOSIS — E782 Mixed hyperlipidemia: Secondary | ICD-10-CM

## 2021-03-11 DIAGNOSIS — N179 Acute kidney failure, unspecified: Secondary | ICD-10-CM | POA: Diagnosis not present

## 2021-03-11 DIAGNOSIS — R339 Retention of urine, unspecified: Secondary | ICD-10-CM | POA: Diagnosis not present

## 2021-03-11 DIAGNOSIS — Z8673 Personal history of transient ischemic attack (TIA), and cerebral infarction without residual deficits: Secondary | ICD-10-CM

## 2021-03-11 DIAGNOSIS — S199XXA Unspecified injury of neck, initial encounter: Secondary | ICD-10-CM | POA: Diagnosis not present

## 2021-03-11 DIAGNOSIS — G932 Benign intracranial hypertension: Secondary | ICD-10-CM | POA: Diagnosis present

## 2021-03-11 DIAGNOSIS — E785 Hyperlipidemia, unspecified: Secondary | ICD-10-CM | POA: Diagnosis present

## 2021-03-11 DIAGNOSIS — D649 Anemia, unspecified: Secondary | ICD-10-CM

## 2021-03-11 DIAGNOSIS — M47812 Spondylosis without myelopathy or radiculopathy, cervical region: Secondary | ICD-10-CM | POA: Diagnosis not present

## 2021-03-11 DIAGNOSIS — Z87891 Personal history of nicotine dependence: Secondary | ICD-10-CM

## 2021-03-11 DIAGNOSIS — N390 Urinary tract infection, site not specified: Secondary | ICD-10-CM

## 2021-03-11 DIAGNOSIS — S0636AA Traumatic hemorrhage of cerebrum, unspecified, with loss of consciousness status unknown, initial encounter: Secondary | ICD-10-CM

## 2021-03-11 DIAGNOSIS — W01190A Fall on same level from slipping, tripping and stumbling with subsequent striking against furniture, initial encounter: Secondary | ICD-10-CM | POA: Diagnosis present

## 2021-03-11 DIAGNOSIS — G936 Cerebral edema: Secondary | ICD-10-CM | POA: Diagnosis not present

## 2021-03-11 DIAGNOSIS — N3281 Overactive bladder: Secondary | ICD-10-CM

## 2021-03-11 DIAGNOSIS — R402142 Coma scale, eyes open, spontaneous, at arrival to emergency department: Secondary | ICD-10-CM | POA: Diagnosis present

## 2021-03-11 DIAGNOSIS — I2510 Atherosclerotic heart disease of native coronary artery without angina pectoris: Secondary | ICD-10-CM | POA: Diagnosis not present

## 2021-03-11 DIAGNOSIS — W19XXXA Unspecified fall, initial encounter: Secondary | ICD-10-CM

## 2021-03-11 DIAGNOSIS — E041 Nontoxic single thyroid nodule: Secondary | ICD-10-CM | POA: Diagnosis not present

## 2021-03-11 LAB — BASIC METABOLIC PANEL
Anion gap: 7 (ref 5–15)
BUN: 13 mg/dL (ref 8–23)
CO2: 26 mmol/L (ref 22–32)
Calcium: 10.2 mg/dL (ref 8.9–10.3)
Chloride: 108 mmol/L (ref 98–111)
Creatinine, Ser: 0.83 mg/dL (ref 0.44–1.00)
GFR, Estimated: >60 mL/min (ref >60)
Glucose, Bld: 106 mg/dL — ABNORMAL HIGH (ref 70–99)
Potassium: 3.9 mmol/L (ref 3.5–5.1)
Sodium: 141 mmol/L (ref 135–145)

## 2021-03-11 LAB — CBC WITH DIFFERENTIAL/PLATELET
Abs Immature Granulocytes: 0.03 10*3/uL (ref 0.00–0.07)
Basophils Absolute: 0 10*3/uL (ref 0.0–0.1)
Basophils Relative: 0 %
Eosinophils Absolute: 0 10*3/uL (ref 0.0–0.5)
Eosinophils Relative: 0 %
HCT: 37.1 % (ref 36.0–46.0)
Hemoglobin: 12.1 g/dL (ref 12.0–15.0)
Immature Granulocytes: 0 %
Lymphocytes Relative: 10 %
Lymphs Abs: 1.1 10*3/uL (ref 0.7–4.0)
MCH: 32.3 pg (ref 26.0–34.0)
MCHC: 32.6 g/dL (ref 30.0–36.0)
MCV: 98.9 fL (ref 80.0–100.0)
Monocytes Absolute: 0.8 10*3/uL (ref 0.1–1.0)
Monocytes Relative: 7 %
Neutro Abs: 9.2 10*3/uL — ABNORMAL HIGH (ref 1.7–7.7)
Neutrophils Relative %: 83 %
Platelets: 193 10*3/uL (ref 150–400)
RBC: 3.75 MIL/uL — ABNORMAL LOW (ref 3.87–5.11)
RDW: 12.9 % (ref 11.5–15.5)
WBC: 11.2 10*3/uL — ABNORMAL HIGH (ref 4.0–10.5)
nRBC: 0 % (ref 0.0–0.2)

## 2021-03-11 LAB — GLUCOSE, CAPILLARY: Glucose-Capillary: 121 mg/dL — ABNORMAL HIGH (ref 70–99)

## 2021-03-11 LAB — RESP PANEL BY RT-PCR (FLU A&B, COVID) ARPGX2
Influenza A by PCR: NEGATIVE
Influenza B by PCR: NEGATIVE
SARS Coronavirus 2 by RT PCR: NEGATIVE

## 2021-03-11 MED ORDER — LORAZEPAM 2 MG/ML IJ SOLN
0.5000 mg | Freq: Four times a day (QID) | INTRAMUSCULAR | Status: DC | PRN
  Administered 2021-03-11 – 2021-03-12 (×2): 0.5 mg via INTRAVENOUS
  Filled 2021-03-11 (×2): qty 1

## 2021-03-11 MED ORDER — AMLODIPINE BESYLATE 10 MG PO TABS
10.0000 mg | ORAL_TABLET | Freq: Every day | ORAL | Status: DC
  Administered 2021-03-12 – 2021-03-16 (×5): 10 mg via ORAL
  Filled 2021-03-11 (×6): qty 1

## 2021-03-11 MED ORDER — DARIFENACIN HYDROBROMIDE ER 7.5 MG PO TB24
7.5000 mg | ORAL_TABLET | Freq: Every day | ORAL | Status: DC
  Administered 2021-03-12 – 2021-03-29 (×17): 7.5 mg via ORAL
  Filled 2021-03-11 (×19): qty 1

## 2021-03-11 MED ORDER — ONDANSETRON HCL 4 MG/2ML IJ SOLN
4.0000 mg | Freq: Four times a day (QID) | INTRAMUSCULAR | Status: DC | PRN
  Filled 2021-03-11: qty 2

## 2021-03-11 MED ORDER — LEVETIRACETAM IN NACL 500 MG/100ML IV SOLN
500.0000 mg | Freq: Two times a day (BID) | INTRAVENOUS | Status: DC
  Administered 2021-03-11 – 2021-03-12 (×4): 500 mg via INTRAVENOUS
  Filled 2021-03-11 (×4): qty 100

## 2021-03-11 MED ORDER — TRAZODONE HCL 50 MG PO TABS
50.0000 mg | ORAL_TABLET | Freq: Every evening | ORAL | Status: DC | PRN
  Administered 2021-03-18 – 2021-03-25 (×4): 50 mg via ORAL
  Filled 2021-03-11 (×6): qty 1

## 2021-03-11 MED ORDER — SODIUM CHLORIDE 0.9% FLUSH
3.0000 mL | Freq: Two times a day (BID) | INTRAVENOUS | Status: DC
  Administered 2021-03-11 – 2021-03-28 (×30): 3 mL via INTRAVENOUS

## 2021-03-11 MED ORDER — ACETAMINOPHEN 650 MG RE SUPP: 650.0000 mg | Freq: Four times a day (QID) | RECTAL | Status: DC | PRN

## 2021-03-11 MED ORDER — BISACODYL 10 MG RE SUPP
10.0000 mg | Freq: Every day | RECTAL | Status: DC | PRN
  Administered 2021-03-19: 10 mg via RECTAL
  Filled 2021-03-11: qty 1

## 2021-03-11 MED ORDER — LORATADINE 10 MG PO TABS: 10.0000 mg | ORAL_TABLET | Freq: Every day | ORAL | 0 refills | Status: DC

## 2021-03-11 MED ORDER — PANTOPRAZOLE SODIUM 40 MG PO TBEC
40.0000 mg | DELAYED_RELEASE_TABLET | Freq: Every day | ORAL | Status: DC
  Administered 2021-03-12 – 2021-03-29 (×18): 40 mg via ORAL
  Filled 2021-03-11 (×19): qty 1

## 2021-03-11 MED ORDER — ATORVASTATIN CALCIUM 20 MG PO TABS: 20.0000 mg | ORAL_TABLET | Freq: Every day | ORAL | 0 refills | Status: DC

## 2021-03-11 MED ORDER — ATORVASTATIN CALCIUM 10 MG PO TABS
20.0000 mg | ORAL_TABLET | Freq: Every day | ORAL | Status: DC
  Administered 2021-03-12 – 2021-03-28 (×17): 20 mg via ORAL
  Filled 2021-03-11 (×19): qty 2

## 2021-03-11 MED ORDER — VERAPAMIL HCL ER 180 MG PO TBCR: 180.0000 mg | EXTENDED_RELEASE_TABLET | Freq: Every day | ORAL | 0 refills | Status: AC

## 2021-03-11 MED ORDER — LORAZEPAM 2 MG/ML IJ SOLN
0.5000 mg | Freq: Once | INTRAMUSCULAR | Status: AC
  Administered 2021-03-11: 0.5 mg via INTRAMUSCULAR
  Filled 2021-03-11: qty 1

## 2021-03-11 MED ORDER — VERAPAMIL HCL ER 180 MG PO TBCR: 180.0000 mg | EXTENDED_RELEASE_TABLET | Freq: Every day | ORAL | Status: DC

## 2021-03-11 MED ORDER — POLYETHYLENE GLYCOL 3350 17 G PO PACK
17.0000 g | PACK | Freq: Every day | ORAL | Status: DC | PRN
  Administered 2021-03-19 – 2021-03-25 (×3): 17 g via ORAL
  Filled 2021-03-11 (×3): qty 1

## 2021-03-11 MED ORDER — SODIUM CHLORIDE 0.9% FLUSH
3.0000 mL | INTRAVENOUS | Status: DC | PRN
  Administered 2021-03-11: 3 mL via INTRAVENOUS

## 2021-03-11 MED ORDER — CARVEDILOL 3.125 MG PO TABS
3.1250 mg | ORAL_TABLET | Freq: Two times a day (BID) | ORAL | Status: DC
  Administered 2021-03-12 – 2021-03-29 (×33): 3.125 mg via ORAL
  Filled 2021-03-11 (×36): qty 1

## 2021-03-11 MED ORDER — SODIUM CHLORIDE 0.9% FLUSH
3.0000 mL | Freq: Two times a day (BID) | INTRAVENOUS | Status: DC
  Administered 2021-03-11 – 2021-03-29 (×20): 3 mL via INTRAVENOUS

## 2021-03-11 MED ORDER — SODIUM CHLORIDE 0.9 % IV SOLN
250.0000 mL | INTRAVENOUS | Status: DC | PRN
  Administered 2021-03-11: 250 mL via INTRAVENOUS

## 2021-03-11 MED ORDER — POLYSACCHARIDE IRON COMPLEX 150 MG PO CAPS
150.0000 mg | ORAL_CAPSULE | Freq: Every day | ORAL | Status: DC
  Administered 2021-03-12 – 2021-03-29 (×18): 150 mg via ORAL
  Filled 2021-03-11 (×19): qty 1

## 2021-03-11 MED ORDER — LABETALOL HCL 5 MG/ML IV SOLN: 10.0000 mg | INTRAVENOUS | Status: DC | PRN

## 2021-03-11 MED ORDER — ACETAMINOPHEN 325 MG PO TABS
650.0000 mg | ORAL_TABLET | Freq: Four times a day (QID) | ORAL | Status: DC | PRN
  Administered 2021-03-13 – 2021-03-27 (×5): 650 mg via ORAL
  Filled 2021-03-11 (×5): qty 2

## 2021-03-11 MED ORDER — ONDANSETRON HCL 4 MG PO TABS: 4.0000 mg | ORAL_TABLET | Freq: Four times a day (QID) | ORAL | Status: DC | PRN

## 2021-03-11 NOTE — ED Notes (Signed)
Patient transported to CT 

## 2021-03-11 NOTE — ED Notes (Signed)
Upon assessment pt had moderate amount of vomit on self and clothes. New shirt placed and face cleaned.

## 2021-03-11 NOTE — ED Triage Notes (Signed)
Per patient's son, patient fell this morning by tripping over a table, striking her left eye brow on the corner. Bleeding controlled in triage with hematoma to left eye. Patient denies pain and denies pain throughout body with palpation. Patient hx of dementia at baseline and answers some questions. Son did not want to come into the hallway with patient.

## 2021-03-11 NOTE — Progress Notes (Signed)
Neurosurgery  87 yo F with dementia s/p fall with several small bifrontal contusions, small focal tSAH in left ambient cistern.  No neurosurgical intervention indicated.  Recommend repeat CT head without contrast in am, Keppra x 7 days, MAPs 70-100

## 2021-03-11 NOTE — ED Notes (Signed)
Daughter updated on plan of care 

## 2021-03-11 NOTE — Plan of Care (Signed)

## 2021-03-11 NOTE — ED Notes (Signed)
Pt returned from CT °

## 2021-03-11 NOTE — ED Provider Notes (Signed)
[facility] EMERGENCY DEPARTMENT Provider Note   CSN: [id number] Arrival date & time: 03/11/21  0703     History Chief Complaint  Patient presents with   [facility] Injury    Amanda [name] is a 87 y.o. female.  HPI Patient presents from home due to a fall.  Patient has dementia, level 5 caveat.  She cannot provide any details of the event.  History is obtained by the patient's son, with assistance of nursing staff.  Seemingly the patient fell, after tripping on a table this morning.  She struck her left brow against the corner.  There is no reported loss of consciousness, no reported change from interactivity, the patient self does not complain of anything, though again her dementia limits history substantially.  No reported recent illness.  Update: Son now present.  He notes that the patient has had decline over about the past 6 months.  He notes that she has fallen previously.  Today's fall occurred yesterday, about 12 hours prior to ED arrival, this is contradictory to initial report.  He notes that the patient was unwilling to be evaluated yesterday, and is interacting in a typical manner.    Past Medical History:  Diagnosis Date   Allergy    Anxiety    Coronary artery disease    LAD 30% followed by 60% stenosis; pressure wire measurement demonstrated no significant gradient; circumflex had 30% stenosis, right coronary artery has 40% stenosed; EF 75-80%   Diverticulosis    GERD (gastroesophageal reflux disease)    Hematuria    Hyperlipidemia    Hypertension    Impaired renal function 04/23/2018   Stroke ([place]) 2003   With a left MCA occlusion   TIA (transient ischemic attack)     Patient Active Problem List   Diagnosis Date Noted   Subarachnoid hematoma 03/11/2021   Cerebral atrophy ([place]) 01/12/2021   AMS (altered mental status) 12/31/2020   Dysphagia 07/28/2020   Abdominal pain 07/28/2020   Recurrent UTI 03/19/2020   Leg pain, posterior, right 10/08/2019   OAB  (overactive bladder) 09/18/2019   Chronic cystitis with hematuria 06/19/2019   Hematochezia 02/13/2019   Melena 02/13/2019   Anemia 02/13/2019   Constipation 02/13/2019   Absolute anemia 01/31/2019   Disorder of phosphorus metabolism 01/31/2019   CKD (chronic kidney disease) stage 3, GFR 30-59 ml/min (HCC) 07/20/2018   Postural dizziness 04/23/2018   Osteoporosis 01/15/2018   Transient cerebral ischemia 04/10/2013   Hyperlipidemia 09/22/2008   Essential hypertension 09/22/2008   Coronary atherosclerosis 09/22/2008   History of CVA (cerebrovascular accident) 09/22/2008   GERD 09/22/2008   Diverticulosis of colon 09/22/2008   Cerebral infarction due to unspecified occlusion or stenosis of unspecified cerebral artery ([place]) 09/22/2008    Past Surgical History:  Procedure Laterality Date   ABDOMINAL HYSTERECTOMY     partial and complete - x  surgeries   APPENDECTOMY     CHOLECYSTECTOMY     COLONOSCOPY N/A 04/19/2019   TI normal, four sessile polyps, 2-6 mm in size, multiple small and large-mouthed diverticula in entire colon, external and internal hemorrhoids, (tubular adenoma, sessile serrated polyp without dysplasia)   ESOPHAGOGASTRODUODENOSCOPY N/A 04/19/2019   mild schatzki ring s/p dilation, multiple gastric polyps (fundic), non-bleeding duodenal diverticulum, benign small bowel, no villous, no dysplasia)   EYE SURGERY Bilateral    cataracts   Hysterectomy-type unspecified     POLYPECTOMY  04/19/2019   Procedure: POLYPECTOMY;  Surgeon: Danie Binder, MD;  Location: [facility]  ENDO SUITE;  Service: Endoscopy;;  colon    TONSILLECTOMY       OB History   No obstetric history on file.     Family History  Problem Relation Age of Onset   Colon cancer Mother 51       dx on colonoscopy    Cancer Mother        colon   Cancer Father        unsure if cancer  --- tumor on brain   Heart attack Sister    Heart attack Brother    Diabetes Brother    GI problems Son    COPD Sister     Hypertension Brother    Heart disease Brother    Heart attack Brother    Heart disease Brother    Alcohol abuse Brother     Social History   Tobacco Use   Smoking status: Former    Packs/day: 0.25    Years: 3.00    Pack years: 0.75    Types: Cigarettes    Quit date: 06/06/1985    Years since quitting: 35.7   Smokeless tobacco: Never   Tobacco comments:    Approximately 10-pack-year history  Vaping Use   Vaping Use: Never used  Substance Use Topics   Alcohol use: No   Drug use: No    Home Medications Prior to Admission medications   Medication Sig Start Date End Date Taking? Authorizing Provider  omeprazole (PRILOSEC) 20 MG capsule TAKE 1 CAPSULE (20 MG TOTAL) BY MOUTH 2 (TWO) TIMES DAILY BEFORE A MEAL. 03/26/20  Yes Annitta Needs, NP  verapamil (CALAN-SR) 180 MG CR tablet Take 1 tablet (180 mg total) by mouth at bedtime. 03/11/21  Yes Ronnie Doss M, DO  aspirin EC 81 MG tablet Take 1 tablet (81 mg total) by mouth daily with breakfast. Swallow whole. 01/01/21   Roxan Hockey, MD  atorvastatin (LIPITOR) 20 MG tablet Take 1 tablet (20 mg total) by mouth at bedtime. 03/11/21   [name], DO  iron polysaccharides (NIFEREX) 150 MG capsule Take 150 mg by mouth daily.    [provider]  loratadine (CLARITIN) 10 MG tablet Take 1 tablet (10 mg total) by mouth daily. 03/11/21   [name], DO  Multiple Vitamins-Minerals (PRESERVISION AREDS 2) CAPS Take 1 capsule by mouth 2 (two) times a day. 11/06/18   [name], DO  solifenacin (VESICARE) 5 MG tablet Take 1 tablet (5 mg total) by mouth daily. 03/19/20   McKenzie, Candee Furbish, MD  VITAMIN D PO 1,000 Int'l Units/day. 07/22/20   [provider]    Allergies    Livalo [pitavastatin], Simvastatin, and Lisinopril  Review of Systems   Review of Systems  Unable to perform ROS: Dementia   Physical Exam Updated Vital Signs BP (!) 155/72 (BP Location: Right Arm)   Pulse 76   Temp 98 F  (36.7 C) (Oral)   Resp 14   Ht 5' (1.524 m)   Wt 60 kg   SpO2 93%   BMI 25.83 kg/m   Physical Exam Vitals and nursing note reviewed.  Constitutional:      General: She is not in acute distress.    Appearance: She is well-developed.  HENT:     Head: Normocephalic.   Eyes:     Conjunctiva/sclera: Conjunctivae normal.  Neck:     Comments: No deformity, no crepitus, patient moves spontaneously. Cardiovascular:     Rate and Rhythm: Normal rate and  regular rhythm.  Pulmonary:     Effort: Pulmonary effort is normal. No respiratory distress.     Breath sounds: Normal breath sounds. No stridor.  Abdominal:     General: There is no distension.  Skin:    General: Skin is warm and dry.  Neurological:     Mental Status: She is alert.     Cranial Nerves: No cranial nerve deficit.     Comments: Patient is alert, though only intermittently interactive.  She does not respond verbally consistently.  She does move all extremities spontaneously, though not reliably to command.  Psychiatric:        Cognition and Memory: Cognition is impaired. Memory is impaired.    ED Results / Procedures / Treatments   Labs (all labs ordered are listed, but only abnormal results are displayed) Labs Reviewed  BASIC METABOLIC PANEL - Abnormal; Notable for the following components:      Result Value   Glucose, Bld 106 (*)    All other components within normal limits  CBC WITH DIFFERENTIAL/PLATELET - Abnormal; Notable for the following components:   WBC 11.2 (*)    RBC 3.75 (*)    Neutro Abs 9.2 (*)    All other components within normal limits  RESP PANEL BY RT-PCR (FLU A&B, COVID) ARPGX2    EKG None  Radiology CT Head Wo Contrast  Result Date: 03/11/2021 CLINICAL DATA:  Polytrauma, critical, head/C-spine injury suspected; Facial trauma. Fall. EXAM: CT HEAD WITHOUT CONTRAST CT MAXILLOFACIAL WITHOUT CONTRAST CT CERVICAL SPINE WITHOUT CONTRAST TECHNIQUE: Multidetector CT imaging of the head,  cervical spine, and maxillofacial structures were performed using the standard protocol without intravenous contrast. Multiplanar CT image reconstructions of the cervical spine and maxillofacial structures were also generated. COMPARISON:  Head CT, head and neck CTA, and head MRI 12/31/2020 FINDINGS: CT HEAD FINDINGS Brain: There are multiple small acute parenchymal hemorrhages anteriorly in the frontal lobes measuring up to 1.9 cm in size with minimal surrounding edema consistent with hemorrhagic contusions with this history. There is a small amount of acute subarachnoid hemorrhage in the left choroidal fissure, left ambient cistern, and inter hemispheric fissure. No acute cortically based infarct, midline shift, or extra-axial fluid collection is identified. There is moderate cerebral atrophy. Patchy hypodensities in the cerebral white matter bilaterally are unchanged and nonspecific but compatible with moderate to severe chronic small vessel ischemic disease. Vascular: Calcified atherosclerosis at the skull base. No hyperdense vessel. Skull: No fracture or suspicious osseous lesion. Other: None. CT MAXILLOFACIAL FINDINGS Osseous: No acute fracture, mandibular dislocation, or suspicious osseous lesion. Edentulous. Orbits: Bilateral cataract extraction. Mild left periorbital soft tissue swelling. No retrobulbar hematoma. Sinuses: Scattered mild mucosal thickening in the paranasal sinuses. Small volume fluid in the left maxillary sinus. Clear mastoid air cells. Soft tissues: Left facial contusion. CT CERVICAL SPINE FINDINGS Alignment: Chronic mild reversal of the normal cervical lordosis with grade 1 anterolisthesis C3 on C4 in C4 on C5 and grade 1 retrolisthesis of C5 on C6. Skull base and vertebrae: No acute fracture or suspicious osseous lesion. Median C1-2 arthropathy with mild ligamentous thickening and calcification about the dens. Soft tissues and spinal canal: No prevertebral fluid or swelling. No visible  canal hematoma. Disc levels: Diffuse cervical disc degeneration, most severe from C5-6 to C7-T1. Advanced facet arthrosis in the upper cervical spine. Advanced neural foraminal stenosis on the left at C4-5 and on the right at C5-6. Upper chest: No apical lung consolidation or mass. Other: 8 mm right thyroid nodule  for which no imaging follow-up is recommended. IMPRESSION: 1. Acute bifrontal hemorrhagic contusions and small volume subarachnoid hemorrhage. 2. No acute cervical spine or maxillofacial fracture. 3. Moderate to severe chronic small vessel ischemic disease. Critical Value/emergent results were called by telephone at the time of interpretation on 03/11/2021 at 11:28 am to Dr. Carmin Muskrat, who verbally acknowledged these results. Electronically Signed   By: Logan Bores M.D.   On: 03/11/2021 11:32   CT Cervical Spine Wo Contrast  Result Date: 03/11/2021 CLINICAL DATA:  Polytrauma, critical, head/C-spine injury suspected; Facial trauma. Fall. EXAM: CT HEAD WITHOUT CONTRAST CT MAXILLOFACIAL WITHOUT CONTRAST CT CERVICAL SPINE WITHOUT CONTRAST TECHNIQUE: Multidetector CT imaging of the head, cervical spine, and maxillofacial structures were performed using the standard protocol without intravenous contrast. Multiplanar CT image reconstructions of the cervical spine and maxillofacial structures were also generated. COMPARISON:  Head CT, head and neck CTA, and head MRI 12/31/2020 FINDINGS: CT HEAD FINDINGS Brain: There are multiple small acute parenchymal hemorrhages anteriorly in the frontal lobes measuring up to 1.9 cm in size with minimal surrounding edema consistent with hemorrhagic contusions with this history. There is a small amount of acute subarachnoid hemorrhage in the left choroidal fissure, left ambient cistern, and inter hemispheric fissure. No acute cortically based infarct, midline shift, or extra-axial fluid collection is identified. There is moderate cerebral atrophy. Patchy hypodensities  in the cerebral white matter bilaterally are unchanged and nonspecific but compatible with moderate to severe chronic small vessel ischemic disease. Vascular: Calcified atherosclerosis at the skull base. No hyperdense vessel. Skull: No fracture or suspicious osseous lesion. Other: None. CT MAXILLOFACIAL FINDINGS Osseous: No acute fracture, mandibular dislocation, or suspicious osseous lesion. Edentulous. Orbits: Bilateral cataract extraction. Mild left periorbital soft tissue swelling. No retrobulbar hematoma. Sinuses: Scattered mild mucosal thickening in the paranasal sinuses. Small volume fluid in the left maxillary sinus. Clear mastoid air cells. Soft tissues: Left facial contusion. CT CERVICAL SPINE FINDINGS Alignment: Chronic mild reversal of the normal cervical lordosis with grade 1 anterolisthesis C3 on C4 in C4 on C5 and grade 1 retrolisthesis of C5 on C6. Skull base and vertebrae: No acute fracture or suspicious osseous lesion. Median C1-2 arthropathy with mild ligamentous thickening and calcification about the dens. Soft tissues and spinal canal: No prevertebral fluid or swelling. No visible canal hematoma. Disc levels: Diffuse cervical disc degeneration, most severe from C5-6 to C7-T1. Advanced facet arthrosis in the upper cervical spine. Advanced neural foraminal stenosis on the left at C4-5 and on the right at C5-6. Upper chest: No apical lung consolidation or mass. Other: 8 mm right thyroid nodule for which no imaging follow-up is recommended. IMPRESSION: 1. Acute bifrontal hemorrhagic contusions and small volume subarachnoid hemorrhage. 2. No acute cervical spine or maxillofacial fracture. 3. Moderate to severe chronic small vessel ischemic disease. Critical Value/emergent results were called by telephone at the time of interpretation on 03/11/2021 at 11:28 am to Dr. Carmin Muskrat, who verbally acknowledged these results. Electronically Signed   By: Logan Bores M.D.   On: 03/11/2021 11:32   CT  Maxillofacial WO CM  Result Date: 03/11/2021 CLINICAL DATA:  Polytrauma, critical, head/C-spine injury suspected; Facial trauma. Fall. EXAM: CT HEAD WITHOUT CONTRAST CT MAXILLOFACIAL WITHOUT CONTRAST CT CERVICAL SPINE WITHOUT CONTRAST TECHNIQUE: Multidetector CT imaging of the head, cervical spine, and maxillofacial structures were performed using the standard protocol without intravenous contrast. Multiplanar CT image reconstructions of the cervical spine and maxillofacial structures were also generated. COMPARISON:  Head CT, head and neck CTA,  and head MRI 12/31/2020 FINDINGS: CT HEAD FINDINGS Brain: There are multiple small acute parenchymal hemorrhages anteriorly in the frontal lobes measuring up to 1.9 cm in size with minimal surrounding edema consistent with hemorrhagic contusions with this history. There is a small amount of acute subarachnoid hemorrhage in the left choroidal fissure, left ambient cistern, and inter hemispheric fissure. No acute cortically based infarct, midline shift, or extra-axial fluid collection is identified. There is moderate cerebral atrophy. Patchy hypodensities in the cerebral white matter bilaterally are unchanged and nonspecific but compatible with moderate to severe chronic small vessel ischemic disease. Vascular: Calcified atherosclerosis at the skull base. No hyperdense vessel. Skull: No fracture or suspicious osseous lesion. Other: None. CT MAXILLOFACIAL FINDINGS Osseous: No acute fracture, mandibular dislocation, or suspicious osseous lesion. Edentulous. Orbits: Bilateral cataract extraction. Mild left periorbital soft tissue swelling. No retrobulbar hematoma. Sinuses: Scattered mild mucosal thickening in the paranasal sinuses. Small volume fluid in the left maxillary sinus. Clear mastoid air cells. Soft tissues: Left facial contusion. CT CERVICAL SPINE FINDINGS Alignment: Chronic mild reversal of the normal cervical lordosis with grade 1 anterolisthesis C3 on C4 in C4 on  C5 and grade 1 retrolisthesis of C5 on C6. Skull base and vertebrae: No acute fracture or suspicious osseous lesion. Median C1-2 arthropathy with mild ligamentous thickening and calcification about the dens. Soft tissues and spinal canal: No prevertebral fluid or swelling. No visible canal hematoma. Disc levels: Diffuse cervical disc degeneration, most severe from C5-6 to C7-T1. Advanced facet arthrosis in the upper cervical spine. Advanced neural foraminal stenosis on the left at C4-5 and on the right at C5-6. Upper chest: No apical lung consolidation or mass. Other: 8 mm right thyroid nodule for which no imaging follow-up is recommended. IMPRESSION: 1. Acute bifrontal hemorrhagic contusions and small volume subarachnoid hemorrhage. 2. No acute cervical spine or maxillofacial fracture. 3. Moderate to severe chronic small vessel ischemic disease. Critical Value/emergent results were called by telephone at the time of interpretation on 03/11/2021 at 11:28 am to Dr. Carmin Muskrat, who verbally acknowledged these results. Electronically Signed   By: Logan Bores M.D.   On: 03/11/2021 11:32    Procedures Procedures   Medications Ordered in ED Medications  levETIRAcetam (KEPPRA) IVPB 500 mg/100 mL premix (0 mg Intravenous Stopped 03/11/21 1404)  LORazepam (ATIVAN) injection 0.5 mg (0.5 mg Intramuscular Given 03/11/21 1015)    ED Course  I have reviewed the triage vital signs and the nursing notes.  Pertinent labs & imaging results that were available during my care of the patient were reviewed by me and considered in my medical decision making (see chart for details).  At bedside I discussed CT imaging results with the patient's son.  We discussed implications of intracranial hemorrhage, and I have previously discussed the patient's radiographic findings with our radiology team reviewed the images myself.  Per Dr. Marcello Moores, neurosurgery, goal MAP 70-100, Keppra will be started.  Patient found to have  multiple intracranial hemorrhage, will require admission for repeat imaging, monitoring, management.  Son and I discussed patient's goals of care, status, he indicates that the patient would not be interested in prolonged artificial life support, and DO NOT RESUSCITATE was appropriate for her.   I discussed the patient's case with our internal medicine colleague, patient will be admitted with presumed transfer to our affiliated tertiary care center when space is available for ongoing monitoring, management of intracranial hemorrhage likely sustained as a result of fall that occurred yesterday.  MDM Rules/Calculators/A&P MDM Number  of Diagnoses or Management Options Fall, initial encounter: new, needed workup Traumatic hemorrhage of cerebrum with unknown loss of consciousness status, unspecified laterality, initial encounter: new, needed workup   Amount and/or Complexity of Data Reviewed Clinical lab tests: ordered and reviewed Tests in the radiology section of CPT: ordered and reviewed Tests in the medicine section of CPT: reviewed and ordered Discussion of test results with the performing providers: yes Decide to obtain previous medical records or to obtain history from someone other than the patient: yes Obtain history from someone other than the patient: yes Review and summarize past medical records: yes Discuss the patient with other providers: yes Independent visualization of images, tracings, or specimens: yes  Risk of Complications, Morbidity, and/or Mortality Presenting problems: high Diagnostic procedures: high Management options: high  Critical Care Total time providing critical care: 30-74 minutes (45)  Patient Progress Patient progress: stable   Final Clinical Impression(s) / ED Diagnoses Final diagnoses:  Fall, initial encounter  Traumatic hemorrhage of cerebrum with unknown loss of consciousness status, unspecified laterality, initial encounter     Carmin Muskrat, MD 03/11/21 1416

## 2021-03-11 NOTE — ED Notes (Signed)
Report called to [facility] at this time.

## 2021-03-11 NOTE — Progress Notes (Signed)
Attempted to administer PO meds x 3; pt sput out meds.

## 2021-03-11 NOTE — H&P (Addendum)
Patient Demographics:    Amanda [name], is a 87 y.o. female  MRN: [id number]   DOB - 11-03-1930  Admit Date - 03/11/2021  Outpatient Primary MD for the patient is [name], DO   Assessment & Plan:    Principal Problem:   Subarachnoid hematoma Active Problems:   Hyperlipidemia   History of CVA (cerebrovascular accident)   Cerebral atrophy ([place])  1)Traumatic ICH----patient with recurrent falls at home -Patient had large volume emesis without blood or bile--- suspect due to head injury with concussion/increased intracranial pressure from [place] CT head on 03/11/2021 showed Acute bifrontal hemorrhagic contusions and small volume subarachnoid hemorrhage. -EDP discussed case with on-call neurosurgeon Dr. Duffy Rhody who recommended repeat CT head on 03/12/2021 -Also recommended Keppra x7 days and controlling BP and keeping MAP between 70 and 100 mmhg --If repeat CT head on 03/12/2021 is concerning (Not stable)--consider neurosurgical consult at that time -- Otherwise possible discharge home with home health versus SNF in 1 to 2 days after PT and OT eval  2)Advanced Dementia/Recurrent Falls--baseline patient has significant cognitive and memory deficits with behavioral disturbance --PTA she lives with a son with recurrent falls (trips over her dog at home) -Supportive care -Get PT and OT eval  3)HTN--amlodipine 10 mg daily, give ICH keep MAPs between 70 and 100--add Coreg 3.125 mg twice daily for better BP control -May use IV labetalol as needed elevated BP  4)H/o Prior Ischemic CVA--hold Aspirin due to [place], c/n Lipitor  5)Social/Ethics--scattered patient's son and daughter,  -patient is a DNR/DNI  --No limitations to treatment at this time  6)HFpEF----Echo from 01/01/2021 with EF 70 to 75%, and grade 1  diastolic dysfunction -Appears compensated -Coreg as ordered, hold off on diuretics  Disposition/Need for in-Hospital Stay- patient unable to be discharged at this time due to [place] requiring further neuro observation, and evaluation*  Dispo: The patient is from: Home              Anticipated d/c is to: SNF Vs Home with [facility]               Anticipated d/c date is: 1 day              Patient currently is not medically stable to d/c. Barriers: Not Clinically Stable-   With History of - Reviewed by me  Past Medical History:  Diagnosis Date   Allergy    Anxiety    Coronary artery disease    LAD 30% followed by 60% stenosis; pressure wire measurement demonstrated no significant gradient; circumflex had 30% stenosis, right coronary artery has 40% stenosed; EF 75-80%   Diverticulosis    GERD (gastroesophageal reflux disease)    Hematuria    Hyperlipidemia    Hypertension    Impaired renal function 04/23/2018   Stroke ([place]) 2003   With a left MCA occlusion   TIA (transient ischemic attack)       Past Surgical  History:  Procedure Laterality Date   ABDOMINAL HYSTERECTOMY     partial and complete - x  surgeries   APPENDECTOMY     CHOLECYSTECTOMY     COLONOSCOPY N/A 04/19/2019   TI normal, four sessile polyps, 2-6 mm in size, multiple small and large-mouthed diverticula in entire colon, external and internal hemorrhoids, (tubular adenoma, sessile serrated polyp without dysplasia)   ESOPHAGOGASTRODUODENOSCOPY N/A 04/19/2019   mild schatzki ring s/p dilation, multiple gastric polyps (fundic), non-bleeding duodenal diverticulum, benign small bowel, no villous, no dysplasia)   EYE SURGERY Bilateral    cataracts   Hysterectomy-type unspecified     POLYPECTOMY  04/19/2019   Procedure: POLYPECTOMY;  Surgeon: Danie Binder, MD;  Location: [facility];  Service: Endoscopy;;  colon    TONSILLECTOMY        Chief Complaint  Patient presents with   Fall   Head Injury      HPI:     Amanda [name]  is a 87 y.o. female with past medical history relevant for prior stroke, HLD, HTN and advanced dementia who presents to the ED after another episode of fall with significant facial contusions/ecchymosis -PTA she lives with a son with recurrent falls (trips over her dog at home) -Patient is a very poor historian due to advanced dementia, most of the history is obtained from patient's daughter and son -Apparently no fevers, no cough or respiratory symptoms --Patient had large volume emesis without blood or bile--- suspect due to head injury with concussion/increased intracranial pressure from [place]  In ED--CT maxillofacial without contrast, CT head without contrast, and CT C-spine without contrast shows------ Acute bifrontal hemorrhagic contusions and small volume, subarachnoid hemorrhage., . No acute cervical spine or maxillofacial fracture, ---Moderate to severe chronic small vessel ischemic disease.   -WBC is 11.2 hemoglobin is 12.1, platelets 193-- -PT/INR pending -Creatinine 0.83 -EDP discussed case with on-call neurosurgeon who gave recommendations   Review of systems:    In addition to the HPI above,   A full Review of  Systems was done, all other systems reviewed are negative except as noted above in HPI , .    Social History:  Reviewed by me    Social History   Tobacco Use   Smoking status: Former    Packs/day: 0.25    Years: 3.00    Pack years: 0.75    Types: Cigarettes    Quit date: 06/06/1985    Years since quitting: 35.7   Smokeless tobacco: Never   Tobacco comments:    Approximately 10-pack-year history  Substance Use Topics   Alcohol use: No       Family History :  Reviewed by me    Family History  Problem Relation Age of Onset   Colon cancer Mother 42       dx on colonoscopy    Cancer Mother        colon   Cancer Father        unsure if cancer  --- tumor on brain   Heart attack Sister    Heart attack Brother    Diabetes Brother    GI  problems Son    COPD Sister    Hypertension Brother    Heart disease Brother    Heart attack Brother    Heart disease Brother    Alcohol abuse Brother     Home Medications:   Prior to Admission medications   Medication Sig Start Date End Date Taking? Authorizing Provider  omeprazole (  PRILOSEC) 20 MG capsule TAKE 1 CAPSULE (20 MG TOTAL) BY MOUTH 2 (TWO) TIMES DAILY BEFORE A MEAL. 03/26/20  Yes Annitta Needs, NP  verapamil (CALAN-SR) 180 MG CR tablet Take 1 tablet (180 mg total) by mouth at bedtime. 03/11/21  Yes Ronnie Doss M, DO  aspirin EC 81 MG tablet Take 1 tablet (81 mg total) by mouth daily with breakfast. Swallow whole. 01/01/21   Roxan Hockey, MD  atorvastatin (LIPITOR) 20 MG tablet Take 1 tablet (20 mg total) by mouth at bedtime. 03/11/21   [name], DO  iron polysaccharides (NIFEREX) 150 MG capsule Take 150 mg by mouth daily.    [provider]  loratadine (CLARITIN) 10 MG tablet Take 1 tablet (10 mg total) by mouth daily. 03/11/21   [name], DO  Multiple Vitamins-Minerals (PRESERVISION AREDS 2) CAPS Take 1 capsule by mouth 2 (two) times a day. 11/06/18   [name], DO  solifenacin (VESICARE) 5 MG tablet Take 1 tablet (5 mg total) by mouth daily. 03/19/20   McKenzie, Candee Furbish, MD  VITAMIN D PO 1,000 Int'l Units/day. 07/22/20   [provider]     Allergies:     Allergies  Allergen Reactions   Livalo [Pitavastatin] Other (See Comments)    Causes dizziness   Simvastatin Other (See Comments)    Causes dizziness   Lisinopril     Hallucinations, resolved on ARB     Physical Exam:   Vitals  Blood pressure (!) 153/94, pulse 89, temperature 97.9 F (36.6 C), temperature source Oral, resp. rate 16, height 5' (1.524 m), weight 60 kg, SpO2 92 %.  Physical Examination: General appearance -pleasantly confused and disoriented  mental status -baseline cognitive and memory deficits  HEENT-facial and periorbital  contusion/ecchymosis-left more than right Neck - supple, no JVD elevation , Chest - clear  to auscultation bilaterally, symmetrical air movement,  Heart - S1 and S2 normal, regular  Abdomen - soft, nontender, nondistended, no masses or organomegaly Neurological -unsteady gait, generalized weakness and deconditioning, no new focal deficits per se Extremities - no pedal edema noted, intact peripheral pulses  Skin - warm, dry     Data Review:    CBC Recent Labs  Lab 03/11/21 1255  WBC 11.2*  HGB 12.1  HCT 37.1  PLT 193  MCV 98.9  MCH 32.3  MCHC 32.6  RDW 12.9  LYMPHSABS 1.1  MONOABS 0.8  EOSABS 0.0  BASOSABS 0.0   ------------------------------------------------------------------------------------------------------------------  Chemistries  Recent Labs  Lab 03/11/21 1255  NA 141  K 3.9  CL 108  CO2 26  GLUCOSE 106*  BUN 13  CREATININE 0.83  CALCIUM 10.2   ------------------------------------------------------------------------------------------------------------------ estimated creatinine clearance is 38.7 mL/min (by C-G formula based on SCr of 0.83 mg/dL). ------------------------------------------------------------------------------------------------------------------ No results for input(s): TSH, T4TOTAL, T3FREE, THYROIDAB in the last 72 hours.  Invalid input(s): FREET3   Coagulation profile No results for input(s): INR, PROTIME in the last 168 hours. ------------------------------------------------------------------------------------------------------------------- No results for input(s): DDIMER in the last 72 hours. -------------------------------------------------------------------------------------------------------------------  Cardiac Enzymes No results for input(s): CKMB, TROPONINI, MYOGLOBIN in the last 168 hours.  Invalid input(s):  CK ------------------------------------------------------------------------------------------------------------------ No results found for: BNP   ---------------------------------------------------------------------------------------------------------------  Urinalysis    Component Value Date/Time   COLORURINE YELLOW 01/01/2021 0926   APPEARANCEUR CLEAR 01/01/2021 0926   APPEARANCEUR Clear 03/19/2020 0930   LABSPEC 1.031 (H) 01/01/2021 0926   PHURINE 7.0 01/01/2021 0926   GLUCOSEU NEGATIVE 01/01/2021 0926   HGBUR NEGATIVE 01/01/2021  [place] 01/01/2021 0926   BILIRUBINUR Negative 03/19/2020 0930   KETONESUR NEGATIVE 01/01/2021 0926   PROTEINUR NEGATIVE 01/01/2021 0926   UROBILINOGEN 0.2 12/18/2019 1009   UROBILINOGEN 1.0 04/10/2013 2115   NITRITE NEGATIVE 01/01/2021 0926   LEUKOCYTESUR SMALL (A) 01/01/2021 0926    ----------------------------------------------------------------------------------------------------------------   Imaging Results:    CT Head Wo Contrast  Result Date: 03/11/2021 CLINICAL DATA:  Polytrauma, critical, head/C-spine injury suspected; Facial trauma. Fall. EXAM: CT HEAD WITHOUT CONTRAST CT MAXILLOFACIAL WITHOUT CONTRAST CT CERVICAL SPINE WITHOUT CONTRAST TECHNIQUE: Multidetector CT imaging of the head, cervical spine, and maxillofacial structures were performed using the standard protocol without intravenous contrast. Multiplanar CT image reconstructions of the cervical spine and maxillofacial structures were also generated. COMPARISON:  Head CT, head and neck CTA, and head MRI 12/31/2020 FINDINGS: CT HEAD FINDINGS Brain: There are multiple small acute parenchymal hemorrhages anteriorly in the frontal lobes measuring up to 1.9 cm in size with minimal surrounding edema consistent with hemorrhagic contusions with this history. There is a small amount of acute subarachnoid hemorrhage in the left choroidal fissure, left ambient cistern, and inter  hemispheric fissure. No acute cortically based infarct, midline shift, or extra-axial fluid collection is identified. There is moderate cerebral atrophy. Patchy hypodensities in the cerebral white matter bilaterally are unchanged and nonspecific but compatible with moderate to severe chronic small vessel ischemic disease. Vascular: Calcified atherosclerosis at the skull base. No hyperdense vessel. Skull: No fracture or suspicious osseous lesion. Other: None. CT MAXILLOFACIAL FINDINGS Osseous: No acute fracture, mandibular dislocation, or suspicious osseous lesion. Edentulous. Orbits: Bilateral cataract extraction. Mild left periorbital soft tissue swelling. No retrobulbar hematoma. Sinuses: Scattered mild mucosal thickening in the paranasal sinuses. Small volume fluid in the left maxillary sinus. Clear mastoid air cells. Soft tissues: Left facial contusion. CT CERVICAL SPINE FINDINGS Alignment: Chronic mild reversal of the normal cervical lordosis with grade 1 anterolisthesis C3 on C4 in C4 on C5 and grade 1 retrolisthesis of C5 on C6. Skull base and vertebrae: No acute fracture or suspicious osseous lesion. Median C1-2 arthropathy with mild ligamentous thickening and calcification about the dens. Soft tissues and spinal canal: No prevertebral fluid or swelling. No visible canal hematoma. Disc levels: Diffuse cervical disc degeneration, most severe from C5-6 to C7-T1. Advanced facet arthrosis in the upper cervical spine. Advanced neural foraminal stenosis on the left at C4-5 and on the right at C5-6. Upper chest: No apical lung consolidation or mass. Other: 8 mm right thyroid nodule for which no imaging follow-up is recommended. IMPRESSION: 1. Acute bifrontal hemorrhagic contusions and small volume subarachnoid hemorrhage. 2. No acute cervical spine or maxillofacial fracture. 3. Moderate to severe chronic small vessel ischemic disease. Critical Value/emergent results were called by telephone at the time of  interpretation on 03/11/2021 at 11:28 am to Dr. Carmin Muskrat, who verbally acknowledged these results. Electronically Signed   By: Logan Bores M.D.   On: 03/11/2021 11:32   CT Cervical Spine Wo Contrast  Result Date: 03/11/2021 CLINICAL DATA:  Polytrauma, critical, head/C-spine injury suspected; Facial trauma. Fall. EXAM: CT HEAD WITHOUT CONTRAST CT MAXILLOFACIAL WITHOUT CONTRAST CT CERVICAL SPINE WITHOUT CONTRAST TECHNIQUE: Multidetector CT imaging of the head, cervical spine, and maxillofacial structures were performed using the standard protocol without intravenous contrast. Multiplanar CT image reconstructions of the cervical spine and maxillofacial structures were also generated. COMPARISON:  Head CT, head and neck CTA, and head MRI 12/31/2020 FINDINGS: CT HEAD FINDINGS Brain: There are multiple small acute parenchymal hemorrhages  anteriorly in the frontal lobes measuring up to 1.9 cm in size with minimal surrounding edema consistent with hemorrhagic contusions with this history. There is a small amount of acute subarachnoid hemorrhage in the left choroidal fissure, left ambient cistern, and inter hemispheric fissure. No acute cortically based infarct, midline shift, or extra-axial fluid collection is identified. There is moderate cerebral atrophy. Patchy hypodensities in the cerebral white matter bilaterally are unchanged and nonspecific but compatible with moderate to severe chronic small vessel ischemic disease. Vascular: Calcified atherosclerosis at the skull base. No hyperdense vessel. Skull: No fracture or suspicious osseous lesion. Other: None. CT MAXILLOFACIAL FINDINGS Osseous: No acute fracture, mandibular dislocation, or suspicious osseous lesion. Edentulous. Orbits: Bilateral cataract extraction. Mild left periorbital soft tissue swelling. No retrobulbar hematoma. Sinuses: Scattered mild mucosal thickening in the paranasal sinuses. Small volume fluid in the left maxillary sinus. Clear mastoid  air cells. Soft tissues: Left facial contusion. CT CERVICAL SPINE FINDINGS Alignment: Chronic mild reversal of the normal cervical lordosis with grade 1 anterolisthesis C3 on C4 in C4 on C5 and grade 1 retrolisthesis of C5 on C6. Skull base and vertebrae: No acute fracture or suspicious osseous lesion. Median C1-2 arthropathy with mild ligamentous thickening and calcification about the dens. Soft tissues and spinal canal: No prevertebral fluid or swelling. No visible canal hematoma. Disc levels: Diffuse cervical disc degeneration, most severe from C5-6 to C7-T1. Advanced facet arthrosis in the upper cervical spine. Advanced neural foraminal stenosis on the left at C4-5 and on the right at C5-6. Upper chest: No apical lung consolidation or mass. Other: 8 mm right thyroid nodule for which no imaging follow-up is recommended. IMPRESSION: 1. Acute bifrontal hemorrhagic contusions and small volume subarachnoid hemorrhage. 2. No acute cervical spine or maxillofacial fracture. 3. Moderate to severe chronic small vessel ischemic disease. Critical Value/emergent results were called by telephone at the time of interpretation on 03/11/2021 at 11:28 am to Dr. Carmin Muskrat, who verbally acknowledged these results. Electronically Signed   By: Logan Bores M.D.   On: 03/11/2021 11:32   CT Maxillofacial WO CM  Result Date: 03/11/2021 CLINICAL DATA:  Polytrauma, critical, head/C-spine injury suspected; Facial trauma. Fall. EXAM: CT HEAD WITHOUT CONTRAST CT MAXILLOFACIAL WITHOUT CONTRAST CT CERVICAL SPINE WITHOUT CONTRAST TECHNIQUE: Multidetector CT imaging of the head, cervical spine, and maxillofacial structures were performed using the standard protocol without intravenous contrast. Multiplanar CT image reconstructions of the cervical spine and maxillofacial structures were also generated. COMPARISON:  Head CT, head and neck CTA, and head MRI 12/31/2020 FINDINGS: CT HEAD FINDINGS Brain: There are multiple small acute  parenchymal hemorrhages anteriorly in the frontal lobes measuring up to 1.9 cm in size with minimal surrounding edema consistent with hemorrhagic contusions with this history. There is a small amount of acute subarachnoid hemorrhage in the left choroidal fissure, left ambient cistern, and inter hemispheric fissure. No acute cortically based infarct, midline shift, or extra-axial fluid collection is identified. There is moderate cerebral atrophy. Patchy hypodensities in the cerebral white matter bilaterally are unchanged and nonspecific but compatible with moderate to severe chronic small vessel ischemic disease. Vascular: Calcified atherosclerosis at the skull base. No hyperdense vessel. Skull: No fracture or suspicious osseous lesion. Other: None. CT MAXILLOFACIAL FINDINGS Osseous: No acute fracture, mandibular dislocation, or suspicious osseous lesion. Edentulous. Orbits: Bilateral cataract extraction. Mild left periorbital soft tissue swelling. No retrobulbar hematoma. Sinuses: Scattered mild mucosal thickening in the paranasal sinuses. Small volume fluid in the left maxillary sinus. Clear mastoid air cells. Soft tissues:  Left facial contusion. CT CERVICAL SPINE FINDINGS Alignment: Chronic mild reversal of the normal cervical lordosis with grade 1 anterolisthesis C3 on C4 in C4 on C5 and grade 1 retrolisthesis of C5 on C6. Skull base and vertebrae: No acute fracture or suspicious osseous lesion. Median C1-2 arthropathy with mild ligamentous thickening and calcification about the dens. Soft tissues and spinal canal: No prevertebral fluid or swelling. No visible canal hematoma. Disc levels: Diffuse cervical disc degeneration, most severe from C5-6 to C7-T1. Advanced facet arthrosis in the upper cervical spine. Advanced neural foraminal stenosis on the left at C4-5 and on the right at C5-6. Upper chest: No apical lung consolidation or mass. Other: 8 mm right thyroid nodule for which no imaging follow-up is  recommended. IMPRESSION: 1. Acute bifrontal hemorrhagic contusions and small volume subarachnoid hemorrhage. 2. No acute cervical spine or maxillofacial fracture. 3. Moderate to severe chronic small vessel ischemic disease. Critical Value/emergent results were called by telephone at the time of interpretation on 03/11/2021 at 11:28 am to Dr. Carmin Muskrat, who verbally acknowledged these results. Electronically Signed   By: Logan Bores M.D.   On: 03/11/2021 11:32    Radiological Exams on Admission: CT Head Wo Contrast  Result Date: 03/11/2021 CLINICAL DATA:  Polytrauma, critical, head/C-spine injury suspected; Facial trauma. Fall. EXAM: CT HEAD WITHOUT CONTRAST CT MAXILLOFACIAL WITHOUT CONTRAST CT CERVICAL SPINE WITHOUT CONTRAST TECHNIQUE: Multidetector CT imaging of the head, cervical spine, and maxillofacial structures were performed using the standard protocol without intravenous contrast. Multiplanar CT image reconstructions of the cervical spine and maxillofacial structures were also generated. COMPARISON:  Head CT, head and neck CTA, and head MRI 12/31/2020 FINDINGS: CT HEAD FINDINGS Brain: There are multiple small acute parenchymal hemorrhages anteriorly in the frontal lobes measuring up to 1.9 cm in size with minimal surrounding edema consistent with hemorrhagic contusions with this history. There is a small amount of acute subarachnoid hemorrhage in the left choroidal fissure, left ambient cistern, and inter hemispheric fissure. No acute cortically based infarct, midline shift, or extra-axial fluid collection is identified. There is moderate cerebral atrophy. Patchy hypodensities in the cerebral white matter bilaterally are unchanged and nonspecific but compatible with moderate to severe chronic small vessel ischemic disease. Vascular: Calcified atherosclerosis at the skull base. No hyperdense vessel. Skull: No fracture or suspicious osseous lesion. Other: None. CT MAXILLOFACIAL FINDINGS Osseous: No  acute fracture, mandibular dislocation, or suspicious osseous lesion. Edentulous. Orbits: Bilateral cataract extraction. Mild left periorbital soft tissue swelling. No retrobulbar hematoma. Sinuses: Scattered mild mucosal thickening in the paranasal sinuses. Small volume fluid in the left maxillary sinus. Clear mastoid air cells. Soft tissues: Left facial contusion. CT CERVICAL SPINE FINDINGS Alignment: Chronic mild reversal of the normal cervical lordosis with grade 1 anterolisthesis C3 on C4 in C4 on C5 and grade 1 retrolisthesis of C5 on C6. Skull base and vertebrae: No acute fracture or suspicious osseous lesion. Median C1-2 arthropathy with mild ligamentous thickening and calcification about the dens. Soft tissues and spinal canal: No prevertebral fluid or swelling. No visible canal hematoma. Disc levels: Diffuse cervical disc degeneration, most severe from C5-6 to C7-T1. Advanced facet arthrosis in the upper cervical spine. Advanced neural foraminal stenosis on the left at C4-5 and on the right at C5-6. Upper chest: No apical lung consolidation or mass. Other: 8 mm right thyroid nodule for which no imaging follow-up is recommended. IMPRESSION: 1. Acute bifrontal hemorrhagic contusions and small volume subarachnoid hemorrhage. 2. No acute cervical spine or maxillofacial fracture. 3. Moderate  to severe chronic small vessel ischemic disease. Critical Value/emergent results were called by telephone at the time of interpretation on 03/11/2021 at 11:28 am to Dr. Carmin Muskrat, who verbally acknowledged these results. Electronically Signed   By: Logan Bores M.D.   On: 03/11/2021 11:32   CT Cervical Spine Wo Contrast  Result Date: 03/11/2021 CLINICAL DATA:  Polytrauma, critical, head/C-spine injury suspected; Facial trauma. Fall. EXAM: CT HEAD WITHOUT CONTRAST CT MAXILLOFACIAL WITHOUT CONTRAST CT CERVICAL SPINE WITHOUT CONTRAST TECHNIQUE: Multidetector CT imaging of the head, cervical spine, and maxillofacial  structures were performed using the standard protocol without intravenous contrast. Multiplanar CT image reconstructions of the cervical spine and maxillofacial structures were also generated. COMPARISON:  Head CT, head and neck CTA, and head MRI 12/31/2020 FINDINGS: CT HEAD FINDINGS Brain: There are multiple small acute parenchymal hemorrhages anteriorly in the frontal lobes measuring up to 1.9 cm in size with minimal surrounding edema consistent with hemorrhagic contusions with this history. There is a small amount of acute subarachnoid hemorrhage in the left choroidal fissure, left ambient cistern, and inter hemispheric fissure. No acute cortically based infarct, midline shift, or extra-axial fluid collection is identified. There is moderate cerebral atrophy. Patchy hypodensities in the cerebral white matter bilaterally are unchanged and nonspecific but compatible with moderate to severe chronic small vessel ischemic disease. Vascular: Calcified atherosclerosis at the skull base. No hyperdense vessel. Skull: No fracture or suspicious osseous lesion. Other: None. CT MAXILLOFACIAL FINDINGS Osseous: No acute fracture, mandibular dislocation, or suspicious osseous lesion. Edentulous. Orbits: Bilateral cataract extraction. Mild left periorbital soft tissue swelling. No retrobulbar hematoma. Sinuses: Scattered mild mucosal thickening in the paranasal sinuses. Small volume fluid in the left maxillary sinus. Clear mastoid air cells. Soft tissues: Left facial contusion. CT CERVICAL SPINE FINDINGS Alignment: Chronic mild reversal of the normal cervical lordosis with grade 1 anterolisthesis C3 on C4 in C4 on C5 and grade 1 retrolisthesis of C5 on C6. Skull base and vertebrae: No acute fracture or suspicious osseous lesion. Median C1-2 arthropathy with mild ligamentous thickening and calcification about the dens. Soft tissues and spinal canal: No prevertebral fluid or swelling. No visible canal hematoma. Disc levels: Diffuse  cervical disc degeneration, most severe from C5-6 to C7-T1. Advanced facet arthrosis in the upper cervical spine. Advanced neural foraminal stenosis on the left at C4-5 and on the right at C5-6. Upper chest: No apical lung consolidation or mass. Other: 8 mm right thyroid nodule for which no imaging follow-up is recommended. IMPRESSION: 1. Acute bifrontal hemorrhagic contusions and small volume subarachnoid hemorrhage. 2. No acute cervical spine or maxillofacial fracture. 3. Moderate to severe chronic small vessel ischemic disease. Critical Value/emergent results were called by telephone at the time of interpretation on 03/11/2021 at 11:28 am to Dr. Carmin Muskrat, who verbally acknowledged these results. Electronically Signed   By: Logan Bores M.D.   On: 03/11/2021 11:32   CT Maxillofacial WO CM  Result Date: 03/11/2021 CLINICAL DATA:  Polytrauma, critical, head/C-spine injury suspected; Facial trauma. Fall. EXAM: CT HEAD WITHOUT CONTRAST CT MAXILLOFACIAL WITHOUT CONTRAST CT CERVICAL SPINE WITHOUT CONTRAST TECHNIQUE: Multidetector CT imaging of the head, cervical spine, and maxillofacial structures were performed using the standard protocol without intravenous contrast. Multiplanar CT image reconstructions of the cervical spine and maxillofacial structures were also generated. COMPARISON:  Head CT, head and neck CTA, and head MRI 12/31/2020 FINDINGS: CT HEAD FINDINGS Brain: There are multiple small acute parenchymal hemorrhages anteriorly in the frontal lobes measuring up to 1.9 cm in size  with minimal surrounding edema consistent with hemorrhagic contusions with this history. There is a small amount of acute subarachnoid hemorrhage in the left choroidal fissure, left ambient cistern, and inter hemispheric fissure. No acute cortically based infarct, midline shift, or extra-axial fluid collection is identified. There is moderate cerebral atrophy. Patchy hypodensities in the cerebral white matter bilaterally are  unchanged and nonspecific but compatible with moderate to severe chronic small vessel ischemic disease. Vascular: Calcified atherosclerosis at the skull base. No hyperdense vessel. Skull: No fracture or suspicious osseous lesion. Other: None. CT MAXILLOFACIAL FINDINGS Osseous: No acute fracture, mandibular dislocation, or suspicious osseous lesion. Edentulous. Orbits: Bilateral cataract extraction. Mild left periorbital soft tissue swelling. No retrobulbar hematoma. Sinuses: Scattered mild mucosal thickening in the paranasal sinuses. Small volume fluid in the left maxillary sinus. Clear mastoid air cells. Soft tissues: Left facial contusion. CT CERVICAL SPINE FINDINGS Alignment: Chronic mild reversal of the normal cervical lordosis with grade 1 anterolisthesis C3 on C4 in C4 on C5 and grade 1 retrolisthesis of C5 on C6. Skull base and vertebrae: No acute fracture or suspicious osseous lesion. Median C1-2 arthropathy with mild ligamentous thickening and calcification about the dens. Soft tissues and spinal canal: No prevertebral fluid or swelling. No visible canal hematoma. Disc levels: Diffuse cervical disc degeneration, most severe from C5-6 to C7-T1. Advanced facet arthrosis in the upper cervical spine. Advanced neural foraminal stenosis on the left at C4-5 and on the right at C5-6. Upper chest: No apical lung consolidation or mass. Other: 8 mm right thyroid nodule for which no imaging follow-up is recommended. IMPRESSION: 1. Acute bifrontal hemorrhagic contusions and small volume subarachnoid hemorrhage. 2. No acute cervical spine or maxillofacial fracture. 3. Moderate to severe chronic small vessel ischemic disease. Critical Value/emergent results were called by telephone at the time of interpretation on 03/11/2021 at 11:28 am to Dr. Carmin Muskrat, who verbally acknowledged these results. Electronically Signed   By: Logan Bores M.D.   On: 03/11/2021 11:32    DVT Prophylaxis -SCD   AM Labs Ordered, also  please review Full Orders  Family Communication: Admission, patients condition and plan of care including tests being ordered have been discussed with the patient and daughter Ms Danny Lawless who indicate understanding and agree with the plan   Code Status - DNR  Likely DC to  home with [facility] V[facility] SNF  Condition   stable  Roxan Hockey M.D on 03/11/2021 at 5:02 PM Go to [email or web address] -  for contact info  [facility] - Office  [phone]

## 2021-03-11 NOTE — Progress Notes (Signed)
Pt sput out PO med;w ill reattempt.

## 2021-03-11 NOTE — ED Notes (Signed)
Daughter Clarise Cruz updated on plan of care.

## 2021-03-11 NOTE — Telephone Encounter (Signed)
TC from pt and her ins rep Pt needs pharmacy to be changed to [facility] order and refills to be sent there. Pt has an upcoming appt w/ Dr. Lajuana Ripple on 04/26/21 Refills sent to [facility]

## 2021-03-11 NOTE — ED Notes (Signed)
Unable to complete MSE due to hx of dementia

## 2021-03-12 ENCOUNTER — Inpatient Hospital Stay (HOSPITAL_COMMUNITY): Payer: Medicare HMO

## 2021-03-12 ENCOUNTER — Other Ambulatory Visit: Payer: Self-pay | Admitting: Family Medicine

## 2021-03-12 DIAGNOSIS — I609 Nontraumatic subarachnoid hemorrhage, unspecified: Secondary | ICD-10-CM

## 2021-03-12 DIAGNOSIS — I611 Nontraumatic intracerebral hemorrhage in hemisphere, cortical: Secondary | ICD-10-CM | POA: Diagnosis not present

## 2021-03-12 DIAGNOSIS — R402362 Coma scale, best motor response, obeys commands, at arrival to emergency department: Secondary | ICD-10-CM | POA: Diagnosis not present

## 2021-03-12 DIAGNOSIS — Z8673 Personal history of transient ischemic attack (TIA), and cerebral infarction without residual deficits: Secondary | ICD-10-CM | POA: Diagnosis not present

## 2021-03-12 DIAGNOSIS — Y92009 Unspecified place in unspecified non-institutional (private) residence as the place of occurrence of the external cause: Secondary | ICD-10-CM | POA: Diagnosis not present

## 2021-03-12 DIAGNOSIS — S066XAD Traumatic subarachnoid hemorrhage with loss of consciousness status unknown, subsequent encounter: Secondary | ICD-10-CM | POA: Diagnosis not present

## 2021-03-12 DIAGNOSIS — F03918 Unspecified dementia, unspecified severity, with other behavioral disturbance: Secondary | ICD-10-CM | POA: Diagnosis present

## 2021-03-12 DIAGNOSIS — S066XAA Traumatic subarachnoid hemorrhage with loss of consciousness status unknown, initial encounter: Secondary | ICD-10-CM | POA: Diagnosis not present

## 2021-03-12 DIAGNOSIS — W19XXXA Unspecified fall, initial encounter: Secondary | ICD-10-CM | POA: Diagnosis not present

## 2021-03-12 DIAGNOSIS — G936 Cerebral edema: Secondary | ICD-10-CM | POA: Diagnosis not present

## 2021-03-12 DIAGNOSIS — S066X0A Traumatic subarachnoid hemorrhage without loss of consciousness, initial encounter: Secondary | ICD-10-CM | POA: Diagnosis not present

## 2021-03-12 DIAGNOSIS — I5032 Chronic diastolic (congestive) heart failure: Secondary | ICD-10-CM | POA: Diagnosis not present

## 2021-03-12 DIAGNOSIS — Z7982 Long term (current) use of aspirin: Secondary | ICD-10-CM | POA: Diagnosis not present

## 2021-03-12 DIAGNOSIS — R402242 Coma scale, best verbal response, confused conversation, at arrival to emergency department: Secondary | ICD-10-CM | POA: Diagnosis not present

## 2021-03-12 DIAGNOSIS — E782 Mixed hyperlipidemia: Secondary | ICD-10-CM | POA: Diagnosis not present

## 2021-03-12 DIAGNOSIS — Z66 Do not resuscitate: Secondary | ICD-10-CM | POA: Diagnosis not present

## 2021-03-12 DIAGNOSIS — N179 Acute kidney failure, unspecified: Secondary | ICD-10-CM | POA: Diagnosis not present

## 2021-03-12 DIAGNOSIS — R402142 Coma scale, eyes open, spontaneous, at arrival to emergency department: Secondary | ICD-10-CM | POA: Diagnosis not present

## 2021-03-12 DIAGNOSIS — Z20822 Contact with and (suspected) exposure to covid-19: Secondary | ICD-10-CM | POA: Diagnosis not present

## 2021-03-12 DIAGNOSIS — G319 Degenerative disease of nervous system, unspecified: Secondary | ICD-10-CM | POA: Diagnosis not present

## 2021-03-12 DIAGNOSIS — S0636AA Traumatic hemorrhage of cerebrum, unspecified, with loss of consciousness status unknown, initial encounter: Secondary | ICD-10-CM | POA: Diagnosis not present

## 2021-03-12 DIAGNOSIS — Z9049 Acquired absence of other specified parts of digestive tract: Secondary | ICD-10-CM | POA: Diagnosis not present

## 2021-03-12 DIAGNOSIS — R339 Retention of urine, unspecified: Secondary | ICD-10-CM | POA: Diagnosis not present

## 2021-03-12 DIAGNOSIS — I110 Hypertensive heart disease with heart failure: Secondary | ICD-10-CM | POA: Diagnosis not present

## 2021-03-12 DIAGNOSIS — E785 Hyperlipidemia, unspecified: Secondary | ICD-10-CM | POA: Diagnosis not present

## 2021-03-12 DIAGNOSIS — Z9181 History of falling: Secondary | ICD-10-CM | POA: Diagnosis not present

## 2021-03-12 DIAGNOSIS — Z8249 Family history of ischemic heart disease and other diseases of the circulatory system: Secondary | ICD-10-CM | POA: Diagnosis not present

## 2021-03-12 DIAGNOSIS — G932 Benign intracranial hypertension: Secondary | ICD-10-CM | POA: Diagnosis not present

## 2021-03-12 DIAGNOSIS — R69 Illness, unspecified: Secondary | ICD-10-CM | POA: Diagnosis not present

## 2021-03-12 DIAGNOSIS — S06340A Traumatic hemorrhage of right cerebrum without loss of consciousness, initial encounter: Secondary | ICD-10-CM | POA: Diagnosis not present

## 2021-03-12 DIAGNOSIS — W01190A Fall on same level from slipping, tripping and stumbling with subsequent striking against furniture, initial encounter: Secondary | ICD-10-CM | POA: Diagnosis not present

## 2021-03-12 DIAGNOSIS — Z90710 Acquired absence of both cervix and uterus: Secondary | ICD-10-CM | POA: Diagnosis not present

## 2021-03-12 DIAGNOSIS — Z79899 Other long term (current) drug therapy: Secondary | ICD-10-CM | POA: Diagnosis not present

## 2021-03-12 DIAGNOSIS — Z87891 Personal history of nicotine dependence: Secondary | ICD-10-CM | POA: Diagnosis not present

## 2021-03-12 DIAGNOSIS — Z888 Allergy status to other drugs, medicaments and biological substances status: Secondary | ICD-10-CM | POA: Diagnosis not present

## 2021-03-12 LAB — CBC
HCT: 36 % (ref 36.0–46.0)
Hemoglobin: 11.7 g/dL — ABNORMAL LOW (ref 12.0–15.0)
MCH: 31.3 pg (ref 26.0–34.0)
MCHC: 32.5 g/dL (ref 30.0–36.0)
MCV: 96.3 fL (ref 80.0–100.0)
Platelets: 175 10*3/uL (ref 150–400)
RBC: 3.74 MIL/uL — ABNORMAL LOW (ref 3.87–5.11)
RDW: 12.8 % (ref 11.5–15.5)
WBC: 8.4 10*3/uL (ref 4.0–10.5)
nRBC: 0 % (ref 0.0–0.2)

## 2021-03-12 LAB — GLUCOSE, CAPILLARY
Glucose-Capillary: 101 mg/dL — ABNORMAL HIGH (ref 70–99)
Glucose-Capillary: 111 mg/dL — ABNORMAL HIGH (ref 70–99)
Glucose-Capillary: 86 mg/dL (ref 70–99)
Glucose-Capillary: 92 mg/dL (ref 70–99)

## 2021-03-12 LAB — BASIC METABOLIC PANEL
Anion gap: 9 (ref 5–15)
BUN: 11 mg/dL (ref 8–23)
CO2: 24 mmol/L (ref 22–32)
Calcium: 10.3 mg/dL (ref 8.9–10.3)
Chloride: 109 mmol/L (ref 98–111)
Creatinine, Ser: 0.93 mg/dL (ref 0.44–1.00)
GFR, Estimated: 59 mL/min — ABNORMAL LOW (ref >60)
Glucose, Bld: 96 mg/dL (ref 70–99)
Potassium: 3.7 mmol/L (ref 3.5–5.1)
Sodium: 142 mmol/L (ref 135–145)

## 2021-03-12 MED ORDER — HALOPERIDOL LACTATE 5 MG/ML IJ SOLN
1.0000 mg | Freq: Four times a day (QID) | INTRAMUSCULAR | Status: DC | PRN
  Administered 2021-03-13: 1 mg via INTRAVENOUS
  Filled 2021-03-12: qty 1

## 2021-03-12 NOTE — Plan of Care (Signed)

## 2021-03-12 NOTE — TOC CAGE-AID Note (Signed)
Transition of Care [facility]) - CAGE-AID Screening   Patient Details  Name: Vianka BONITA BRINDISI MRN: [id number] Date of Birth: [birth year]  [facility] of [facility]) CM/SW Contact:    Gaetano Hawthorne Tarpley-Carter, LCSWA Phone Number: 03/12/2021, 3:14 PM   Clinical Narrative: Pt participated in [place].  Pt stated she does not use substance or ETOH.  Pt was not offered resources, due to no usage of substance or ETOH.    Smriti Barkow Tarpley-Carter, MSW, LCSW-A Pronouns:  She/Her/Hers [facility] Clinical Social Worker Direct Number:  [phone] [email or web address]   CAGE-AID Screening:    Have You Ever Felt You Ought to Cut Down on Your Drinking or Drug Use?: No Have People Annoyed You By [employer] Your Drinking Or Drug Use?: No Have You Felt Bad Or Guilty About Your Drinking Or Drug Use?: No Have You Ever Had a Drink or Used Drugs First Thing In The Morning to Steady Your Nerves or to Get Rid of a Hangover?: No CAGE-AID Score: 0  Substance Abuse Education Offered: No

## 2021-03-12 NOTE — NC FL2 (Signed)
[place] LEVEL OF CARE SCREENING TOOL     IDENTIFICATION  Patient Name: Amanda [name]: [birth year] Sex: female Admission Date (Current Location): 03/11/2021  [facility] and [place] Number:  [employer] and Address:  [facility] [place] [place] [address] [place], [place] [address]      Provider Number: [id number]  Attending Physician Name and Address:  Charlynne Cousins, MD  Relative Name and Phone Number:       Current Level of Care: Hospital Recommended Level of Care: [place] Prior Approval Number:    Date Approved/Denied:   PASRR Number: [id number]  Discharge Plan: SNF    Current Diagnoses: Patient Active Problem List   Diagnosis Date Noted   Subarachnoid hemorrhage ([place]) 03/12/2021   Subarachnoid hematoma 03/11/2021   Cerebral atrophy ([place]) 01/12/2021   AMS (altered mental status) 12/31/2020   Dysphagia 07/28/2020   Abdominal pain 07/28/2020   Recurrent UTI 03/19/2020   Leg pain, posterior, right 10/08/2019   OAB (overactive bladder) 09/18/2019   Chronic cystitis with hematuria 06/19/2019   Hematochezia 02/13/2019   Melena 02/13/2019   Anemia 02/13/2019   Constipation 02/13/2019   Absolute anemia 01/31/2019   Disorder of phosphorus metabolism 01/31/2019   CKD (chronic kidney disease) stage 3, GFR 30-59 ml/min ([place]) 07/20/2018   Postural dizziness 04/23/2018   Osteoporosis 01/15/2018   Transient cerebral ischemia 04/10/2013   Hyperlipidemia 09/22/2008   Essential hypertension 09/22/2008   Coronary atherosclerosis 09/22/2008   History of CVA (cerebrovascular accident) 09/22/2008   GERD 09/22/2008   Diverticulosis of colon 09/22/2008   Cerebral infarction due to unspecified occlusion or stenosis of unspecified cerebral artery ([place]) 09/22/2008    Orientation RESPIRATION BLADDER Height & Weight     Self  Normal Incontinent, Continent (incontinent at times) Weight: 132 lb 4.4 oz (60  kg) Height:  5' (152.4 cm)  BEHAVIORAL SYMPTOMS/MOOD NEUROLOGICAL BOWEL NUTRITION STATUS      Continent Diet (mechanical soft)  AMBULATORY STATUS COMMUNICATION OF NEEDS Skin   Extensive Assist Verbally Skin abrasions, Bruising                       Personal Care Assistance Level of Assistance  Bathing, Feeding, Dressing Bathing Assistance: Limited assistance Feeding assistance: Limited assistance Dressing Assistance: Limited assistance     Functional Limitations Info             SPECIAL CARE FACTORS FREQUENCY  PT (By licensed PT), OT (By licensed OT)     PT Frequency: 5x/wk OT Frequency: 5x/wk            Contractures Contractures Info: Not present    Additional Factors Info  Code Status, Allergies Code Status Info: DNR Allergies Info: Livalo (Pitavastatin), Simvastatin, Lisinopril           Current Medications (03/12/2021):  This is the current hospital active medication list Current Facility-Administered Medications  Medication Dose Route Frequency Provider Last Rate Last Admin   0.9 %  sodium chloride infusion  250 mL Intravenous PRN Denton Brick, Courage, MD 10 mL/hr at 03/11/21 2033 250 mL at 03/11/21 2033   acetaminophen (TYLENOL) tablet 650 mg  650 mg Oral Q6H PRN Emokpae, Courage, MD       Or   acetaminophen (TYLENOL) suppository 650 mg  650 mg Rectal Q6H PRN Emokpae, Courage, MD       amLODipine (NORVASC) tablet 10 mg  10 mg Oral Daily Roxan Hockey, MD   10  mg at 03/12/21 1020   atorvastatin (LIPITOR) tablet 20 mg  20 mg Oral QHS Emokpae, Courage, MD       bisacodyl (DULCOLAX) suppository 10 mg  10 mg Rectal Daily PRN Emokpae, Courage, MD       carvedilol (COREG) tablet 3.125 mg  3.125 mg Oral BID WC Emokpae, Courage, MD   3.125 mg at 03/12/21 1017   darifenacin (ENABLEX) 24 hr tablet 7.5 mg  7.5 mg Oral Daily Emokpae, Courage, MD   7.5 mg at 03/12/21 1015   haloperidol lactate (HALDOL) injection 1 mg  1 mg Intravenous Q6H PRN Charlynne Cousins, MD        iron polysaccharides (NIFEREX) capsule 150 mg  150 mg Oral Daily Emokpae, Courage, MD   150 mg at 03/12/21 1019   labetalol (NORMODYNE) injection 10 mg  10 mg Intravenous Q4H PRN Emokpae, Courage, MD       levETIRAcetam (KEPPRA) IVPB 500 mg/100 mL premix  500 mg Intravenous Q12H Emokpae, Courage, MD 400 mL/hr at 03/12/21 1025 500 mg at 03/12/21 1025   ondansetron (ZOFRAN) tablet 4 mg  4 mg Oral Q6H PRN Emokpae, Courage, MD       Or   ondansetron (ZOFRAN) injection 4 mg  4 mg Intravenous Q6H PRN Emokpae, Courage, MD       pantoprazole (PROTONIX) EC tablet 40 mg  40 mg Oral Daily Emokpae, Courage, MD   40 mg at 03/12/21 1021   polyethylene glycol (MIRALAX / GLYCOLAX) packet 17 g  17 g Oral Daily PRN Emokpae, Courage, MD       sodium chloride flush (NS) 0.9 % injection 3 mL  3 mL Intravenous Q12H Emokpae, Courage, MD   3 mL at 03/12/21 1106   sodium chloride flush (NS) 0.9 % injection 3 mL  3 mL Intravenous Q12H Emokpae, Courage, MD   3 mL at 03/12/21 1106   sodium chloride flush (NS) 0.9 % injection 3 mL  3 mL Intravenous PRN Roxan Hockey, MD   3 mL at 03/11/21 2032   traZODone (DESYREL) tablet 50 mg  50 mg Oral QHS PRN Roxan Hockey, MD         Discharge Medications: Please see discharge summary for a list of discharge medications.  Relevant Imaging Results:  Relevant Lab Results:   Additional Information SS#: [id number]  Geralynn Ochs, LCSW

## 2021-03-12 NOTE — Evaluation (Signed)
Occupational Therapy Evaluation Patient Details Name: Amanda [name] MRN: [id number] DOB: [birth year] Today's Date: 03/12/2021   History of Present Illness This 87 y.o. female admitted after a significant fall with facial contusion.  CT of head showed acute bifrontal hemorrhage, and small volume SAH.   PMH includes:  Dementia, CAD, HTN, CVA,   Clinical Impression   Pt admitted with above. She demonstrates the below listed deficits and will benefit from continued OT to maximize safety and independence with BADLs.  Pt presents to OT with impaired balance, decreased activity tolerance, impaired cognition.  She currently requires constant supervision when OOB due to high risk of falls due to impaired balance.  She requires set up/supervision for UB ADLs and mod A for LB ADLs. She requires mod A for functional mobility without AD.  Per chart, pt lives with her son.  It appears she was mod I with ADLs in July/August of this year, per chart review - unsure hos she has been functioning since that time.  Attempted to contact son, but no answer.  Pt is at very high risk for further falls and has poor awareness of deficits.  She will need 24 hour close supervision/assist at discharge, and may need a higher level of care such as SNF/LTC.   Acute OT will follow.        Recommendations for follow up therapy are one component of a multi-disciplinary discharge planning process, led by the attending physician.  Recommendations may be updated based on patient status, additional functional criteria and insurance authorization.   Follow Up Recommendations  SNF;Supervision/Assistance - 24 hour    Equipment Recommendations  Tub/shower bench    Recommendations for Other Services       Precautions / Restrictions Precautions Precautions: Fall Precaution Comments: h/o falls per chart      Mobility Bed Mobility Overal bed mobility: Needs Assistance Bed Mobility: Supine to Sit;Sit to Supine     Supine to sit:  Min guard Sit to supine: Min guard        Transfers                      Balance Overall balance assessment: Needs assistance Sitting-balance support: Feet supported Sitting balance-Leahy Scale: Fair Sitting balance - Comments: pt able to maintain static sitting with min guard assist, but looses balance when leaning forward to access feet requiring up to mod A   Standing balance support: Single extremity supported;During functional activity Standing balance-Leahy Scale: Poor Standing balance comment: requires mod A to maintain balance                           ADL either performed or assessed with clinical judgement   ADL Overall ADL's : Needs assistance/impaired Eating/Feeding: Set up;Bed level   Grooming: Wash/dry hands;Set up;Supervision/safety;Sitting   Upper Body Bathing: Set up;Supervision/ safety;Sitting   Lower Body Bathing: Moderate assistance;Sit to/from stand Lower Body Bathing Details (indicate cue type and reason): assist for thoroughness and for balance Upper Body Dressing : Minimal assistance;Sitting   Lower Body Dressing: Moderate assistance;Sit to/from stand Lower Body Dressing Details (indicate cue type and reason): assist for balance and for sequencing and problem solving Toilet Transfer: Moderate assistance;+2 for safety/equipment;Ambulation;Regular Glass blower/designer Details (indicate cue type and reason): Pt incontinent of urine Toileting- Clothing Manipulation and Hygiene: Maximal assistance;Sit to/from stand       Functional mobility during ADLs: Moderate assistance;+2 for safety/equipment  Vision   Additional Comments: Pt unable to participate in formal assessment     Perception Perception Perception Tested?: Yes   Praxis Praxis Praxis tested?: Within functional limits    Pertinent Vitals/Pain Pain Assessment: No/denies pain     Hand Dominance Right   Extremity/Trunk Assessment Upper Extremity  Assessment Upper Extremity Assessment: Generalized weakness   Lower Extremity Assessment Lower Extremity Assessment: Defer to PT evaluation       Communication Communication Communication: No difficulties   Cognition Arousal/Alertness: Awake/alert Behavior During Therapy: Impulsive;Restless Overall Cognitive Status: No family/caregiver present to determine baseline cognitive functioning Area of Impairment: Orientation;Memory;Attention;Following commands;Safety/judgement;Awareness;Problem solving                 Orientation Level: Disoriented to;Time;Situation;Place Current Attention Level: Sustained Memory: Decreased short-term memory Following Commands: Follows one step commands consistently Safety/Judgement: Decreased awareness of safety;Decreased awareness of deficits   Problem Solving: Difficulty sequencing;Requires verbal cues;Requires tactile cues General Comments: Pt is easily distracted   General Comments       Exercises     Shoulder Instructions      Home Living Family/patient expects to be discharged to:: Private residence Living Arrangements: Children Available Help at Discharge: Family Type of Home: House Home Access: Stairs to enter Technical brewer of Steps: 3 Entrance Stairs-Rails: Right;Left;Can reach both Home Layout: Two level Alternate Level Stairs-Number of Steps: 12 Alternate Level Stairs-Rails: Right;Left;Can reach both Bathroom Shower/Tub: Teacher, early years/pre: Standard     Home Equipment: Environmental consultant - 2 wheels;Cane - single point          Prior Functioning/Environment          Comments: No family present, and attempts to contact son were unsuccessful.  Per chart review, pt lived with son.  She has a h/o falls.  In 7/22 she ambulated with min guard assist with RW, but was unsteady without AD        OT Problem List: Decreased activity tolerance;Impaired balance (sitting and/or standing);Decreased  cognition;Decreased safety awareness;Decreased knowledge of use of DME or AE      OT Treatment/Interventions: Self-care/ADL training;Neuromuscular education;DME and/or AE instruction;Therapeutic activities;Cognitive remediation/compensation;Patient/family education;Balance training    OT Goals(Current goals can be found in the care plan section) Acute Rehab OT Goals Patient Stated Goal: pt unable.  Attempted to contact son, but no answer OT Goal Formulation: Patient unable to participate in goal setting Time For Goal Achievement: 03/26/21 Potential to Achieve Goals: Good  OT Frequency: Min 2X/week   Barriers to D/C:    unsure if family able to provide necessary level of assist       Co-evaluation PT/OT/SLP Co-Evaluation/Treatment: Yes Reason for Co-Treatment: To address functional/ADL transfers;For patient/therapist safety;Necessary to address cognition/behavior during functional activity   OT goals addressed during session: ADL's and self-care      AM-PAC OT "6 Clicks" Daily Activity     Outcome Measure Help from another person eating meals?: None Help from another person taking care of personal grooming?: A Lot Help from another person toileting, which includes using toliet, bedpan, or urinal?: A Lot Help from another person bathing (including washing, rinsing, drying)?: A Lot Help from another person to put on and taking off regular upper body clothing?: A Little Help from another person to put on and taking off regular lower body clothing?: A Lot 6 Click Score: 15   End of Session Equipment Utilized During Treatment: Gait belt Nurse Communication: Mobility status  Activity Tolerance: Patient tolerated treatment well Patient left: in  bed;with call bell/phone within reach;with bed alarm set;with SCD's reapplied  OT Visit Diagnosis: Unsteadiness on feet (R26.81);Cognitive communication deficit (R41.841);Repeated falls (R29.6)                Time: 0051-1021 OT Time  Calculation (min): 27 min Charges:  OT General Charges $OT Visit: 1 Visit OT Evaluation $OT Eval Moderate Complexity: 1 Mod  Nilsa Nutting., OTR/L Acute Rehabilitation Services Pager [phone] Office [place], [place] 03/12/2021, 2:53 PM

## 2021-03-12 NOTE — Progress Notes (Signed)
Patient awakened by a blood draw; started getting restless and getting out of bed; ahrd to redirect. Patient urinated; cleaned. Continues to attempt to get out of bed; hard to redirect; when redirected multiple times, patient started hitting and kicking. PRN Ativan 0.5 mg given IV for agitation.
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
## 2021-03-12 NOTE — Evaluation (Signed)
Physical Therapy Evaluation Patient Details Name: Amanda [name] MRN: [id number] DOB: Jul 12, 1930 Today's Date: 03/12/2021  History of Present Illness  This 87 y.o. female admitted after a significant fall with facial contusion.  CT of head showed acute bifrontal hemorrhage, and small volume SAH.   PMH includes:  Dementia, CAD, HTN, CVA,   Clinical Impression  Pt admitted with above diagnosis. Pt currently with functional limitations due to the deficits listed below (see PT Problem List). At the time of PT eval pt was able to perform transfers and ambulation with up to +2 assist for balance support and safety. Pt with decreased awareness of deficits, and with several instances of heavy LOB, requiring therapists to prevent falls. Opted for return to bed with bed alarm set as pt at a high risk for falls to be in the chair, even with a lap belt chair alarm. Pt will benefit from skilled PT to increase their independence and safety with mobility to allow discharge to the venue listed below.          Recommendations for follow up therapy are one component of a multi-disciplinary discharge planning process, led by the attending physician.  Recommendations may be updated based on patient status, additional functional criteria and insurance authorization.  Follow Up Recommendations SNF;Supervision/Assistance - 24 hour    Equipment Recommendations  Other (comment) (TBD by next venue of care)    Recommendations for Other Services       Precautions / Restrictions Precautions Precautions: Fall Precaution Comments: h/o falls per chart Restrictions Weight Bearing Restrictions: No      Mobility  Bed Mobility Overal bed mobility: Needs Assistance Bed Mobility: Supine to Sit;Sit to Supine     Supine to sit: Min assist Sit to supine: Min guard   General bed mobility comments: Assist for trunk elevation to full sitting position. Pt was able to return to supine without assist.    Transfers Overall  transfer level: Needs assistance Equipment used: 1 person hand held assist;2 person hand held assist Transfers: Sit to/from Stand Sit to Stand: Min assist;Mod assist;+2 safety/equipment Stand pivot transfers: Min assist;Mod assist;+2 safety/equipment       General transfer comment: Varying levels of assist to stand and take pivotal steps. Pt unsteady and requires UE support, however does not always reach out for support.  Ambulation/Gait Ambulation/Gait assistance: Min assist;Mod assist;+2 safety/equipment Gait Distance (Feet): 5 Feet Assistive device: 2 person hand held assist;1 person hand held assist Gait Pattern/deviations: Shuffle;Trunk flexed;Narrow base of support Gait velocity: Decreased Gait velocity interpretation: <1.31 ft/sec, indicative of household ambulator General Gait Details: Pt with shuffling steps around room. Gait training limited by sudden, large quantity incontinence of urine.  Stairs            Wheelchair Mobility    Modified Rankin (Stroke Patients Only)       Balance Overall balance assessment: Needs assistance Sitting-balance support: Feet supported Sitting balance-Leahy Scale: Fair Sitting balance - Comments: pt able to maintain static sitting with min guard assist, but looses balance when leaning forward to access feet requiring up to mod A   Standing balance support: Single extremity supported;During functional activity Standing balance-Leahy Scale: Poor Standing balance comment: requires mod A to maintain balance                             Pertinent Vitals/Pain Pain Assessment: No/denies pain    Home Living Family/patient expects to be discharged to:: Private  residence Living Arrangements: Children Available Help at Discharge: Family Type of Home: House Home Access: Stairs to enter Entrance Stairs-Rails: Right;Left;Can reach both Entrance Stairs-Number of Steps: [place]: [place]: Environmental consultant - 2  wheels;Cane - single point      Prior Function           Comments: Per chart review, pt lived with son.  She has a h/o falls.  In 7/22 she ambulated with min guard assist with RW, but was unsteady without AD     Hand Dominance   Dominant Hand: Right    Extremity/Trunk Assessment   Upper Extremity Assessment Upper Extremity Assessment: Defer to OT evaluation    Lower Extremity Assessment Lower Extremity Assessment: Generalized weakness    Cervical / Trunk Assessment Cervical / Trunk Assessment: Kyphotic  Communication   Communication: No difficulties  Cognition Arousal/Alertness: Awake/alert Behavior During Therapy: Impulsive;Restless Overall Cognitive Status: No family/caregiver present to determine baseline cognitive functioning Area of Impairment: Orientation;Memory;Attention;Following commands;Safety/judgement;Awareness;Problem solving                 Orientation Level: Disoriented to;Time;Situation;Place Current Attention Level: Sustained Memory: Decreased short-term memory Following Commands: Follows one step commands consistently Safety/Judgement: Decreased awareness of safety;Decreased awareness of deficits   Problem Solving: Difficulty sequencing;Requires verbal cues;Requires tactile cues General Comments: Pt is easily distracted but redirectable      General Comments      Exercises     Assessment/Plan    PT Assessment Patient needs continued PT services  PT Problem List Decreased range of motion;Decreased strength;Decreased balance;Decreased mobility;Decreased activity tolerance;Decreased cognition;Decreased knowledge of use of DME;Decreased safety awareness;Decreased knowledge of precautions       PT Treatment Interventions DME instruction;Gait training;Functional mobility training;Therapeutic activities;Therapeutic exercise;Neuromuscular re-education;Patient/family education    PT Goals (Current goals can be found in the Care Plan section)   Acute Rehab PT Goals Patient Stated Goal: Pt unable - OT attempted to contact son after session without success PT Goal Formulation: Patient unable to participate in goal setting Time For Goal Achievement: 03/26/21 Potential to Achieve Goals: Fair    Frequency Min 3X/week   Barriers to discharge Decreased caregiver support At this point in time pt would benefit from constant supervision - unsure if this is realistic for family.    Co-evaluation PT/OT/SLP Co-Evaluation/Treatment: Yes Reason for Co-Treatment: Necessary to address cognition/behavior during functional activity;For patient/therapist safety;To address functional/ADL transfers PT goals addressed during session: Mobility/safety with mobility;Balance;Proper use of DME;Strengthening/ROM OT goals addressed during session: ADL's and self-care       AM-PAC PT "6 Clicks" Mobility  Outcome Measure Help needed turning from your back to your side while in a flat bed without using bedrails?: A Little Help needed moving from lying on your back to sitting on the side of a flat bed without using bedrails?: A Little Help needed moving to and from a bed to a chair (including a wheelchair)?: A Little Help needed standing up from a chair using your arms (e.g., wheelchair or bedside chair)?: A Little Help needed to walk in hospital room?: A Little Help needed climbing 3-5 steps with a railing? : A Little 6 Click Score: 18    End of Session   Activity Tolerance: Patient tolerated treatment well Patient left: in bed;with call bell/phone within reach;with bed alarm set Nurse Communication: Mobility status PT Visit Diagnosis: Unsteadiness on feet (R26.81);Repeated falls (R29.6)    Time: 4580-9983 PT Time Calculation (min) (ACUTE ONLY): 29 min   Charges:  PT Evaluation $PT Eval Moderate Complexity: 1 Mod          Rolinda Roan, PT, DPT Acute Rehabilitation Services Pager: [phone] Office: [phone]   Thelma Comp 03/12/2021, 3:22 PM

## 2021-03-12 NOTE — Progress Notes (Signed)
Occupational Therapy Evaluation completed with full note to follow.  Pt requires up to mod A for ADLs and demonstrates significant balance and cognitive deficits.  She will need 24 hour direct assistance due to high fall risk.  Chart review indicates she was mod I in 8/22.  Recommend SNF  Nilsa Nutting., OTR/L Acute Rehabilitation Services Pager [phone] Office [phone]

## 2021-03-12 NOTE — Progress Notes (Signed)
[facility] PROGRESS NOTE    Progress Note  Amanda [name]  TIR:[id number] DOB: 1931/03/12 DOA: 03/11/2021 PCP: [name], DO     Brief Narrative:   Amanda [name] is an 87 y.o. female past medical history significant for prior stroke, dyslipidemia essential hypertension advanced dementia comes into the ED after a significant fall with facial contusion and ecchymosis.  In the ED CT of the head without contrast was done that showed acute bifrontal hemorrhage contusion and small volume subarachnoid hemorrhage CT of the C-spine and maxillofacial showed no fracture or dislocation, the case was discussed with neurosurgery who recommended repeat CT scan on 03/12/2021.  She was also started on IV Keppra and controlling her blood pressure for MAP of 70.    Assessment/Plan:   Traumatic intracranial hemorrhage: Likely due to recurrent falls, the patient has had several falls at home. Patient had large-volume emesis without blood or bile noted to be increased intracranial pressure. She was started on Keppra, try to keep MAP around 7200 mmHg. The case was discussed with neurosurgery who recommended a CT scan of the head on 03/12/2021. Physical therapy has been consulted, the patient will probably need skilled nursing facility placement.  Advance dementia/frequent falls: Patient lives with his son despite this she continues to have recurrent falls. Physical therapy and Occupational Therapy have been consulted. She is at high risk of acute confusional state and aspiration pneumonia. Use Haldol IV as needed for agitation.  Essential hypertension: Continue Norvasc, Coreg pressures been relatively well controlled MAP has been between 100-90.  History of prior CVAs: Holding aspirin due to [place] continue Lipitor.  Ethics/goals of care: Patient is a DNR.  Chronic diastolic heart failure : with an EF of 70% and grade 1 diastolic heart failure, she appears to be compensated continue Coreg  holding off on diuretics.   DVT prophylaxis: scd Family Communication:son Status is: Observation  The patient remains OBS appropriate and will d/c before 2 midnights.  Dispo: The patient is from: Home              Anticipated d/c is to: SNF              Patient currently is not medically stable to d/c.   Difficult to place patient No   Code Status:     Code Status Orders  (From admission, onward)           Start     Ordered   03/11/21 1615  Do not attempt resuscitation (DNR)  Continuous       Question Answer Comment  In the event of cardiac or respiratory ARREST Do not call a "code blue"   In the event of cardiac or respiratory ARREST Do not perform Intubation, CPR, defibrillation or ACLS   In the event of cardiac or respiratory ARREST Use medication by any route, position, wound care, and other measures to relive pain and suffering. May use oxygen, suction and manual treatment of airway obstruction as needed for comfort.   Comments D/w Son and Daughter      03/11/21 1616           Code Status History     Date Active Date Inactive Code Status Order ID Comments User Context   03/11/2021 1138 03/11/2021 1616 DNR [id number]  Carmin Muskrat, MD ED   12/31/2020 2305 01/01/2021 2218 Full Code [id number]  Bethena Roys, MD ED   04/09/2013 1918 04/11/2013 1559 Full Code [id number]  Hosie Poisson, MD Inpatient  IV Access:   Peripheral IV   Procedures and diagnostic studies:   CT Head Wo Contrast  Result Date: 03/11/2021 CLINICAL DATA:  Polytrauma, critical, head/C-spine injury suspected; Facial trauma. Fall. EXAM: CT HEAD WITHOUT CONTRAST CT MAXILLOFACIAL WITHOUT CONTRAST CT CERVICAL SPINE WITHOUT CONTRAST TECHNIQUE: Multidetector CT imaging of the head, cervical spine, and maxillofacial structures were performed using the standard protocol without intravenous contrast. Multiplanar CT image reconstructions of the cervical spine and maxillofacial structures  were also generated. COMPARISON:  Head CT, head and neck CTA, and head MRI 12/31/2020 FINDINGS: CT HEAD FINDINGS Brain: There are multiple small acute parenchymal hemorrhages anteriorly in the frontal lobes measuring up to 1.9 cm in size with minimal surrounding edema consistent with hemorrhagic contusions with this history. There is a small amount of acute subarachnoid hemorrhage in the left choroidal fissure, left ambient cistern, and inter hemispheric fissure. No acute cortically based infarct, midline shift, or extra-axial fluid collection is identified. There is moderate cerebral atrophy. Patchy hypodensities in the cerebral white matter bilaterally are unchanged and nonspecific but compatible with moderate to severe chronic small vessel ischemic disease. Vascular: Calcified atherosclerosis at the skull base. No hyperdense vessel. Skull: No fracture or suspicious osseous lesion. Other: None. CT MAXILLOFACIAL FINDINGS Osseous: No acute fracture, mandibular dislocation, or suspicious osseous lesion. Edentulous. Orbits: Bilateral cataract extraction. Mild left periorbital soft tissue swelling. No retrobulbar hematoma. Sinuses: Scattered mild mucosal thickening in the paranasal sinuses. Small volume fluid in the left maxillary sinus. Clear mastoid air cells. Soft tissues: Left facial contusion. CT CERVICAL SPINE FINDINGS Alignment: Chronic mild reversal of the normal cervical lordosis with grade 1 anterolisthesis C3 on C4 in C4 on C5 and grade 1 retrolisthesis of C5 on C6. Skull base and vertebrae: No acute fracture or suspicious osseous lesion. Median C1-2 arthropathy with mild ligamentous thickening and calcification about the dens. Soft tissues and spinal canal: No prevertebral fluid or swelling. No visible canal hematoma. Disc levels: Diffuse cervical disc degeneration, most severe from C5-6 to C7-T1. Advanced facet arthrosis in the upper cervical spine. Advanced neural foraminal stenosis on the left at C4-5  and on the right at C5-6. Upper chest: No apical lung consolidation or mass. Other: 8 mm right thyroid nodule for which no imaging follow-up is recommended. IMPRESSION: 1. Acute bifrontal hemorrhagic contusions and small volume subarachnoid hemorrhage. 2. No acute cervical spine or maxillofacial fracture. 3. Moderate to severe chronic small vessel ischemic disease. Critical Value/emergent results were called by telephone at the time of interpretation on 03/11/2021 at 11:28 am to Dr. Carmin Muskrat, who verbally acknowledged these results. Electronically Signed   By: Logan Bores M.D.   On: 03/11/2021 11:32   CT Cervical Spine Wo Contrast  Result Date: 03/11/2021 CLINICAL DATA:  Polytrauma, critical, head/C-spine injury suspected; Facial trauma. Fall. EXAM: CT HEAD WITHOUT CONTRAST CT MAXILLOFACIAL WITHOUT CONTRAST CT CERVICAL SPINE WITHOUT CONTRAST TECHNIQUE: Multidetector CT imaging of the head, cervical spine, and maxillofacial structures were performed using the standard protocol without intravenous contrast. Multiplanar CT image reconstructions of the cervical spine and maxillofacial structures were also generated. COMPARISON:  Head CT, head and neck CTA, and head MRI 12/31/2020 FINDINGS: CT HEAD FINDINGS Brain: There are multiple small acute parenchymal hemorrhages anteriorly in the frontal lobes measuring up to 1.9 cm in size with minimal surrounding edema consistent with hemorrhagic contusions with this history. There is a small amount of acute subarachnoid hemorrhage in the left choroidal fissure, left ambient cistern, and inter hemispheric fissure. No acute cortically  based infarct, midline shift, or extra-axial fluid collection is identified. There is moderate cerebral atrophy. Patchy hypodensities in the cerebral white matter bilaterally are unchanged and nonspecific but compatible with moderate to severe chronic small vessel ischemic disease. Vascular: Calcified atherosclerosis at the skull base. No  hyperdense vessel. Skull: No fracture or suspicious osseous lesion. Other: None. CT MAXILLOFACIAL FINDINGS Osseous: No acute fracture, mandibular dislocation, or suspicious osseous lesion. Edentulous. Orbits: Bilateral cataract extraction. Mild left periorbital soft tissue swelling. No retrobulbar hematoma. Sinuses: Scattered mild mucosal thickening in the paranasal sinuses. Small volume fluid in the left maxillary sinus. Clear mastoid air cells. Soft tissues: Left facial contusion. CT CERVICAL SPINE FINDINGS Alignment: Chronic mild reversal of the normal cervical lordosis with grade 1 anterolisthesis C3 on C4 in C4 on C5 and grade 1 retrolisthesis of C5 on C6. Skull base and vertebrae: No acute fracture or suspicious osseous lesion. Median C1-2 arthropathy with mild ligamentous thickening and calcification about the dens. Soft tissues and spinal canal: No prevertebral fluid or swelling. No visible canal hematoma. Disc levels: Diffuse cervical disc degeneration, most severe from C5-6 to C7-T1. Advanced facet arthrosis in the upper cervical spine. Advanced neural foraminal stenosis on the left at C4-5 and on the right at C5-6. Upper chest: No apical lung consolidation or mass. Other: 8 mm right thyroid nodule for which no imaging follow-up is recommended. IMPRESSION: 1. Acute bifrontal hemorrhagic contusions and small volume subarachnoid hemorrhage. 2. No acute cervical spine or maxillofacial fracture. 3. Moderate to severe chronic small vessel ischemic disease. Critical Value/emergent results were called by telephone at the time of interpretation on 03/11/2021 at 11:28 am to Dr. Carmin Muskrat, who verbally acknowledged these results. Electronically Signed   By: Logan Bores M.D.   On: 03/11/2021 11:32   CT Maxillofacial WO CM  Result Date: 03/11/2021 CLINICAL DATA:  Polytrauma, critical, head/C-spine injury suspected; Facial trauma. Fall. EXAM: CT HEAD WITHOUT CONTRAST CT MAXILLOFACIAL WITHOUT CONTRAST CT  CERVICAL SPINE WITHOUT CONTRAST TECHNIQUE: Multidetector CT imaging of the head, cervical spine, and maxillofacial structures were performed using the standard protocol without intravenous contrast. Multiplanar CT image reconstructions of the cervical spine and maxillofacial structures were also generated. COMPARISON:  Head CT, head and neck CTA, and head MRI 12/31/2020 FINDINGS: CT HEAD FINDINGS Brain: There are multiple small acute parenchymal hemorrhages anteriorly in the frontal lobes measuring up to 1.9 cm in size with minimal surrounding edema consistent with hemorrhagic contusions with this history. There is a small amount of acute subarachnoid hemorrhage in the left choroidal fissure, left ambient cistern, and inter hemispheric fissure. No acute cortically based infarct, midline shift, or extra-axial fluid collection is identified. There is moderate cerebral atrophy. Patchy hypodensities in the cerebral white matter bilaterally are unchanged and nonspecific but compatible with moderate to severe chronic small vessel ischemic disease. Vascular: Calcified atherosclerosis at the skull base. No hyperdense vessel. Skull: No fracture or suspicious osseous lesion. Other: None. CT MAXILLOFACIAL FINDINGS Osseous: No acute fracture, mandibular dislocation, or suspicious osseous lesion. Edentulous. Orbits: Bilateral cataract extraction. Mild left periorbital soft tissue swelling. No retrobulbar hematoma. Sinuses: Scattered mild mucosal thickening in the paranasal sinuses. Small volume fluid in the left maxillary sinus. Clear mastoid air cells. Soft tissues: Left facial contusion. CT CERVICAL SPINE FINDINGS Alignment: Chronic mild reversal of the normal cervical lordosis with grade 1 anterolisthesis C3 on C4 in C4 on C5 and grade 1 retrolisthesis of C5 on C6. Skull base and vertebrae: No acute fracture or suspicious osseous lesion. Median  C1-2 arthropathy with mild ligamentous thickening and calcification about the  dens. Soft tissues and spinal canal: No prevertebral fluid or swelling. No visible canal hematoma. Disc levels: Diffuse cervical disc degeneration, most severe from C5-6 to C7-T1. Advanced facet arthrosis in the upper cervical spine. Advanced neural foraminal stenosis on the left at C4-5 and on the right at C5-6. Upper chest: No apical lung consolidation or mass. Other: 8 mm right thyroid nodule for which no imaging follow-up is recommended. IMPRESSION: 1. Acute bifrontal hemorrhagic contusions and small volume subarachnoid hemorrhage. 2. No acute cervical spine or maxillofacial fracture. 3. Moderate to severe chronic small vessel ischemic disease. Critical Value/emergent results were called by telephone at the time of interpretation on 03/11/2021 at 11:28 am to Dr. Carmin Muskrat, who verbally acknowledged these results. Electronically Signed   By: Logan Bores M.D.   On: 03/11/2021 11:32     Medical Consultants:   None.   Subjective:    Amanda [name] awake no new complaints she relates her pain is controlled  Objective:    Vitals:   03/11/21 2020 03/12/21 0008 03/12/21 0320 03/12/21 0758  BP: (!) 146/76 127/77 (!) 154/81 140/71  Pulse: 86 84 79 73  Resp: 17 16 17 17   Temp: 98 F (36.7 C) 97.9 F (36.6 C) 97.9 F (36.6 C) 98 F (36.7 C)  TempSrc: Oral Oral Oral Oral  SpO2: 93% 95% 97% 98%  Weight:      Height:       SpO2: 98 %   Intake/Output Summary (Last 24 hours) at 03/12/2021 0806 Last data filed at 03/12/2021 0540 Gross per 24 hour  Intake 265 ml  Output 200 ml  Net 65 ml   Filed Weights   03/11/21 0737  Weight: 60 kg    Exam: General exam: In no acute distress traumatic face Respiratory system: Good air movement and clear to auscultation. Cardiovascular system: S1 & S2 heard, RRR. No JVD.  Gastrointestinal system: Abdomen is nondistended, soft and nontender.  Extremities: No pedal edema. Skin: No rashes, lesions or ulcers Psychiatry: Mood & affect appropriate.     Data Reviewed:    Labs: Basic Metabolic Panel: Recent Labs  Lab 03/11/21 1255 03/12/21 0248  NA 141 142  K 3.9 3.7  CL 108 109  CO2 26 24  GLUCOSE 106* 96  BUN 13 11  CREATININE 0.83 0.93  CALCIUM 10.2 10.3   GFR Estimated Creatinine Clearance: 34.5 mL/min (by C-G formula based on SCr of 0.93 mg/dL). Liver Function Tests: No results for input(s): AST, ALT, ALKPHOS, BILITOT, PROT, ALBUMIN in the last 168 hours. No results for input(s): LIPASE, AMYLASE in the last 168 hours. No results for input(s): AMMONIA in the last 168 hours. Coagulation profile No results for input(s): INR, PROTIME in the last 168 hours. COVID-19 Labs  No results for input(s): DDIMER, FERRITIN, LDH, CRP in the last 72 hours.  Lab Results  Component Value Date   SARSCOV2NAA NEGATIVE 03/11/2021   [place] NEGATIVE 12/31/2020   [place] NEGATIVE 04/17/2019    CBC: Recent Labs  Lab 03/11/21 1255 03/12/21 0248  WBC 11.2* 8.4  NEUTROABS 9.2*  --   HGB 12.1 11.7*  HCT 37.1 36.0  MCV 98.9 96.3  PLT 193 175   Cardiac Enzymes: No results for input(s): CKTOTAL, CKMB, CKMBINDEX, TROPONINI in the last 168 hours. BNP (last 3 results) No results for input(s): PROBNP in the last 8760 hours. CBG: Recent Labs  Lab 03/11/21 1751 03/12/21 0016 03/12/21 1062  GLUCAP 121* 101* 92   D-Dimer: No results for input(s): DDIMER in the last 72 hours. Hgb A1c: No results for input(s): HGBA1C in the last 72 hours. Lipid Profile: No results for input(s): CHOL, HDL, LDLCALC, TRIG, CHOLHDL, LDLDIRECT in the last 72 hours. Thyroid function studies: No results for input(s): TSH, T4TOTAL, T3FREE, THYROIDAB in the last 72 hours.  Invalid input(s): FREET3 Anemia work up: No results for input(s): VITAMINB12, FOLATE, FERRITIN, TIBC, IRON, RETICCTPCT in the last 72 hours. Sepsis Labs: Recent Labs  Lab 03/11/21 1255 03/12/21 0248  WBC 11.2* 8.4   Microbiology Recent Results (from the past 240  hour(s))  Resp Panel by RT-PCR (Flu A&B, Covid) Nasopharyngeal Swab     Status: None   Collection Time: 03/11/21 12:33 PM   Specimen: Nasopharyngeal Swab; Nasopharyngeal(NP) swabs in vial transport medium  Result Value Ref Range Status   SARS Coronavirus 2 by RT PCR NEGATIVE NEGATIVE Final    Comment: (NOTE) SARS-CoV-2 target nucleic acids are NOT DETECTED.  The SARS-CoV-2 RNA is generally detectable in upper respiratory specimens during the acute phase of infection. The lowest concentration of SARS-CoV-2 viral copies this assay can detect is 138 copies/mL. A negative result does not preclude SARS-Cov-2 infection and should not be used as the sole basis for treatment or other patient management decisions. A negative result may occur with  improper specimen collection/handling, submission of specimen other than nasopharyngeal swab, presence of viral mutation(s) within the areas targeted by this assay, and inadequate number of viral copies(<138 copies/mL). A negative result must be combined with clinical observations, patient history, and epidemiological information. The expected result is Negative.  Fact Sheet for Patients:  EntrepreneurPulse.com.au  Fact Sheet for Healthcare Providers:  IncredibleEmployment.be  This test is no t yet approved or cleared by the [place] FDA and  has been authorized for detection and/or diagnosis of SARS-CoV-2 by FDA under an Emergency Use Authorization (EUA). This EUA will remain  in effect (meaning this test can be used) for the duration of the COVID-19 declaration under Section 564(b)(1) of the Act, 21 U.S.C.section 360bbb-3(b)(1), unless the authorization is terminated  or revoked sooner.       Influenza A by PCR NEGATIVE NEGATIVE Final   Influenza B by PCR NEGATIVE NEGATIVE Final    Comment: (NOTE) The Xpert Xpress SARS-CoV-2/FLU/RSV plus assay is intended as an aid in the diagnosis of influenza from  Nasopharyngeal swab specimens and should not be used as a sole basis for treatment. Nasal washings and aspirates are unacceptable for Xpert Xpress SARS-CoV-2/FLU/RSV testing.  Fact Sheet for Patients: EntrepreneurPulse.com.au  Fact Sheet for Healthcare Providers: IncredibleEmployment.be  This test is not yet approved or cleared by the [place] FDA and has been authorized for detection and/or diagnosis of SARS-CoV-2 by FDA under an Emergency Use Authorization (EUA). This EUA will remain in effect (meaning this test can be used) for the duration of the COVID-19 declaration under Section 564(b)(1) of the Act, 21 U.S.C. section 360bbb-3(b)(1), unless the authorization is terminated or revoked.  Performed at [facility], [address], [place], [place] [address]      Medications:    amLODipine  10 mg Oral Daily   atorvastatin  20 mg Oral QHS   carvedilol  3.125 mg Oral BID WC   darifenacin  7.5 mg Oral Daily   iron polysaccharides  150 mg Oral Daily   pantoprazole  40 mg Oral Daily   sodium chloride flush  3 mL Intravenous Q12H  sodium chloride flush  3 mL Intravenous Q12H   Continuous Infusions:  sodium chloride 250 mL (03/11/21 2033)   levETIRAcetam 500 mg (03/11/21 2031)      LOS: 0 days   Charlynne Cousins  [facility]  03/12/2021, 8:06 AM

## 2021-03-13 DIAGNOSIS — S066XAA Traumatic subarachnoid hemorrhage with loss of consciousness status unknown, initial encounter: Secondary | ICD-10-CM | POA: Diagnosis not present

## 2021-03-13 DIAGNOSIS — I609 Nontraumatic subarachnoid hemorrhage, unspecified: Secondary | ICD-10-CM

## 2021-03-13 DIAGNOSIS — G319 Degenerative disease of nervous system, unspecified: Secondary | ICD-10-CM | POA: Diagnosis not present

## 2021-03-13 DIAGNOSIS — E782 Mixed hyperlipidemia: Secondary | ICD-10-CM | POA: Diagnosis not present

## 2021-03-13 LAB — GLUCOSE, CAPILLARY
Glucose-Capillary: 106 mg/dL — ABNORMAL HIGH (ref 70–99)
Glucose-Capillary: 109 mg/dL — ABNORMAL HIGH (ref 70–99)
Glucose-Capillary: 119 mg/dL — ABNORMAL HIGH (ref 70–99)
Glucose-Capillary: 128 mg/dL — ABNORMAL HIGH (ref 70–99)
Glucose-Capillary: 144 mg/dL — ABNORMAL HIGH (ref 70–99)

## 2021-03-13 MED ORDER — VERAPAMIL HCL ER 180 MG PO TBCR
180.0000 mg | EXTENDED_RELEASE_TABLET | Freq: Every day | ORAL | Status: DC
  Administered 2021-03-13 – 2021-03-28 (×16): 180 mg via ORAL
  Filled 2021-03-13 (×16): qty 1

## 2021-03-13 MED ORDER — LEVETIRACETAM 500 MG PO TABS: 500.0000 mg | ORAL_TABLET | Freq: Two times a day (BID) | ORAL | Status: DC

## 2021-03-13 MED ORDER — LEVETIRACETAM ER 500 MG PO TB24
500.0000 mg | ORAL_TABLET | Freq: Every day | ORAL | Status: DC
  Administered 2021-03-13 – 2021-03-14 (×2): 500 mg via ORAL
  Filled 2021-03-13 (×3): qty 1

## 2021-03-13 MED ORDER — LEVETIRACETAM 100 MG/ML PO SOLN: 500.0000 mg | Freq: Two times a day (BID) | ORAL | Status: DC

## 2021-03-13 NOTE — Plan of Care (Signed)
  Problem: Education: Goal: Knowledge of General Education information will improve Description: Including pain rating scale, medication(s)/side effects and non-pharmacologic comfort measures Outcome: Progressing   Problem: Health Behavior/Discharge Planning: Goal: Ability to manage health-related needs will improve Outcome: Progressing   Problem: Clinical Measurements: Goal: Ability to maintain clinical measurements within normal limits will improve Outcome: Progressing Goal: Will remain free from infection Outcome: Progressing   Problem: Activity: Goal: Risk for activity intolerance will decrease Outcome: Progressing   Problem: Nutrition: Goal: Adequate nutrition will be maintained Outcome: Progressing   Problem: Coping: Goal: Level of anxiety will decrease Outcome: Progressing   Problem: Elimination: Goal: Will not experience complications related to bowel motility Outcome: Progressing   Problem: Pain Managment: Goal: General experience of comfort will improve Outcome: Progressing   Problem: Safety: Goal: Ability to remain free from injury will improve Outcome: Progressing

## 2021-03-13 NOTE — Progress Notes (Addendum)
Son present for update on his mother.  Has multiple questions re: her status, which was relayed to the MD.  He is concerned as he states he does not know how to care for her.  States that she has had multiple falls at home.  I appears that she has limited supervision as well.

## 2021-03-13 NOTE — Progress Notes (Signed)
Pt constantly trying to get OOB despite continuous verbal redirection. Pt pulling telemetry cords and other equipment off (IV).

## 2021-03-13 NOTE — Progress Notes (Addendum)
[facility] PROGRESS NOTE    Progress Note  Amanda [name]  BPZ:[id number] DOB: 1931/04/01 DOA: 03/11/2021 PCP: [name], DO     Brief Narrative:   Amanda [name] is an 87 y.o. female past medical history significant for prior stroke, dyslipidemia essential hypertension advanced dementia comes into the ED after a significant fall with facial contusion and ecchymosis.  In the ED CT of the head without contrast was done that showed acute bifrontal hemorrhage contusion and small volume subarachnoid hemorrhage CT of the C-spine and maxillofacial showed no fracture or dislocation, the case was discussed with neurosurgery who recommended repeat CT scan on 03/12/2021.  She was also started on IV Keppra and controlling her blood pressure for MAP of 70.  Awaiting skilled nursing facility placement Assessment/Plan:   Traumatic intracranial hemorrhage: Likely due to recurrent falls, the patient has had several falls at home. The case was discussed with neurosurgery who recommended to repeat a CT scan on 03/12/2021 that showed stable bilateral frontal lobe hemorrhagic intraparenchymal confusion associated edema but no mass-effect.No new infarcts. Surgery also recommended Keppra for 7 days. Try  to keep MAP around 70 mmHg. Physical therapy evaluated the patient and recommended skilled nursing facility.  Advance dementia/frequent falls: Patient lives with his son despite this she continues to have recurrent falls. She is at high risk of acute confusional state and aspiration pneumonia. Use Haldol IV as needed for agitation.  Essential hypertension: Continue Norvasc and Coreg pressures been relatively well controlled MAP has been between 100-90.  History of prior CVAs: Holding aspirin due to [facility] continue Lipitor.  Ethics/goals of care: Patient is a DNR.  Chronic diastolic heart failure : with an EF of 70% and grade 1 diastolic heart failure, she appears to be compensated continue Coreg  holding off on diuretics.   DVT prophylaxis: scd Family Communication:son Status is: Observation  The patient remains OBS appropriate and will d/c before 2 midnights.  Dispo: The patient is from: Home              Anticipated d/c is to: SNF              Patient currently is not medically stable to d/c.   Difficult to place patient No   Code Status:     Code Status Orders  (From admission, onward)           Start     Ordered   03/11/21 1615  Do not attempt resuscitation (DNR)  Continuous       Question Answer Comment  In the event of cardiac or respiratory ARREST Do not call a "code blue"   In the event of cardiac or respiratory ARREST Do not perform Intubation, CPR, defibrillation or ACLS   In the event of cardiac or respiratory ARREST Use medication by any route, position, wound care, and other measures to relive pain and suffering. May use oxygen, suction and manual treatment of airway obstruction as needed for comfort.   Comments D/w Son and Daughter      03/11/21 1616           Code Status History     Date Active Date Inactive Code Status Order ID Comments User Context   03/11/2021 1138 03/11/2021 1616 DNR [id number]  Carmin Muskrat, MD ED   12/31/2020 2305 01/01/2021 2218 Full Code [id number]  Bethena Roys, MD ED   04/09/2013 1918 04/11/2013 1559 Full Code [id number]  Hosie Poisson, MD Inpatient  IV Access:   Peripheral IV   Procedures and diagnostic studies:   CT HEAD WO CONTRAST (5MM)  Result Date: 03/12/2021 CLINICAL DATA:  Head trauma, intracranial hemorrhage EXAM: CT HEAD WITHOUT CONTRAST TECHNIQUE: Contiguous axial images were obtained from the base of the skull through the vertex without intravenous contrast. COMPARISON:  03/11/2021 FINDINGS: Brain: Multiple areas of intraparenchymal hemorrhage are seen within the bilateral frontal lobes, not appreciably changed since prior study. Largest area of hemorrhage in the right frontal lobe  measures up to 1.5 cm, grossly stable since prior study. There is mild surrounding edema. Small areas of subarachnoid hemorrhage are again seen within the anterior inter hemispheric fissure, left choroidal fissure, and left ambient cistern unchanged. No new areas of hemorrhage or infarct. Lateral ventricles and remaining midline structures are unremarkable. No mass effect. Vascular: No hyperdense vessel or unexpected calcification. Skull: Normal. Negative for fracture or focal lesion. Sinuses/Orbits: Minimal mucosal thickening left maxillary sinus unchanged. Remaining paranasal sinuses are clear. Other: None. IMPRESSION: 1. Stable bilateral frontal lobe hemorrhagic intraparenchymal contusions and associated edema. No mass effect. 2. Stable small volume subarachnoid hemorrhage. 3. No new infarct or additional hemorrhage. Electronically Signed   By: Randa Ngo M.D.   On: 03/12/2021 18:58   CT Head Wo Contrast  Result Date: 03/11/2021 CLINICAL DATA:  Polytrauma, critical, head/C-spine injury suspected; Facial trauma. Fall. EXAM: CT HEAD WITHOUT CONTRAST CT MAXILLOFACIAL WITHOUT CONTRAST CT CERVICAL SPINE WITHOUT CONTRAST TECHNIQUE: Multidetector CT imaging of the head, cervical spine, and maxillofacial structures were performed using the standard protocol without intravenous contrast. Multiplanar CT image reconstructions of the cervical spine and maxillofacial structures were also generated. COMPARISON:  Head CT, head and neck CTA, and head MRI 12/31/2020 FINDINGS: CT HEAD FINDINGS Brain: There are multiple small acute parenchymal hemorrhages anteriorly in the frontal lobes measuring up to 1.9 cm in size with minimal surrounding edema consistent with hemorrhagic contusions with this history. There is a small amount of acute subarachnoid hemorrhage in the left choroidal fissure, left ambient cistern, and inter hemispheric fissure. No acute cortically based infarct, midline shift, or extra-axial fluid collection  is identified. There is moderate cerebral atrophy. Patchy hypodensities in the cerebral white matter bilaterally are unchanged and nonspecific but compatible with moderate to severe chronic small vessel ischemic disease. Vascular: Calcified atherosclerosis at the skull base. No hyperdense vessel. Skull: No fracture or suspicious osseous lesion. Other: None. CT MAXILLOFACIAL FINDINGS Osseous: No acute fracture, mandibular dislocation, or suspicious osseous lesion. Edentulous. Orbits: Bilateral cataract extraction. Mild left periorbital soft tissue swelling. No retrobulbar hematoma. Sinuses: Scattered mild mucosal thickening in the paranasal sinuses. Small volume fluid in the left maxillary sinus. Clear mastoid air cells. Soft tissues: Left facial contusion. CT CERVICAL SPINE FINDINGS Alignment: Chronic mild reversal of the normal cervical lordosis with grade 1 anterolisthesis C3 on C4 in C4 on C5 and grade 1 retrolisthesis of C5 on C6. Skull base and vertebrae: No acute fracture or suspicious osseous lesion. Median C1-2 arthropathy with mild ligamentous thickening and calcification about the dens. Soft tissues and spinal canal: No prevertebral fluid or swelling. No visible canal hematoma. Disc levels: Diffuse cervical disc degeneration, most severe from C5-6 to C7-T1. Advanced facet arthrosis in the upper cervical spine. Advanced neural foraminal stenosis on the left at C4-5 and on the right at C5-6. Upper chest: No apical lung consolidation or mass. Other: 8 mm right thyroid nodule for which no imaging follow-up is recommended. IMPRESSION: 1. Acute bifrontal hemorrhagic contusions and small volume subarachnoid  hemorrhage. 2. No acute cervical spine or maxillofacial fracture. 3. Moderate to severe chronic small vessel ischemic disease. Critical Value/emergent results were called by telephone at the time of interpretation on 03/11/2021 at 11:28 am to Dr. Carmin Muskrat, who verbally acknowledged these results.  Electronically Signed   By: Logan Bores M.D.   On: 03/11/2021 11:32   CT Cervical Spine Wo Contrast  Result Date: 03/11/2021 CLINICAL DATA:  Polytrauma, critical, head/C-spine injury suspected; Facial trauma. Fall. EXAM: CT HEAD WITHOUT CONTRAST CT MAXILLOFACIAL WITHOUT CONTRAST CT CERVICAL SPINE WITHOUT CONTRAST TECHNIQUE: Multidetector CT imaging of the head, cervical spine, and maxillofacial structures were performed using the standard protocol without intravenous contrast. Multiplanar CT image reconstructions of the cervical spine and maxillofacial structures were also generated. COMPARISON:  Head CT, head and neck CTA, and head MRI 12/31/2020 FINDINGS: CT HEAD FINDINGS Brain: There are multiple small acute parenchymal hemorrhages anteriorly in the frontal lobes measuring up to 1.9 cm in size with minimal surrounding edema consistent with hemorrhagic contusions with this history. There is a small amount of acute subarachnoid hemorrhage in the left choroidal fissure, left ambient cistern, and inter hemispheric fissure. No acute cortically based infarct, midline shift, or extra-axial fluid collection is identified. There is moderate cerebral atrophy. Patchy hypodensities in the cerebral white matter bilaterally are unchanged and nonspecific but compatible with moderate to severe chronic small vessel ischemic disease. Vascular: Calcified atherosclerosis at the skull base. No hyperdense vessel. Skull: No fracture or suspicious osseous lesion. Other: None. CT MAXILLOFACIAL FINDINGS Osseous: No acute fracture, mandibular dislocation, or suspicious osseous lesion. Edentulous. Orbits: Bilateral cataract extraction. Mild left periorbital soft tissue swelling. No retrobulbar hematoma. Sinuses: Scattered mild mucosal thickening in the paranasal sinuses. Small volume fluid in the left maxillary sinus. Clear mastoid air cells. Soft tissues: Left facial contusion. CT CERVICAL SPINE FINDINGS Alignment: Chronic mild  reversal of the normal cervical lordosis with grade 1 anterolisthesis C3 on C4 in C4 on C5 and grade 1 retrolisthesis of C5 on C6. Skull base and vertebrae: No acute fracture or suspicious osseous lesion. Median C1-2 arthropathy with mild ligamentous thickening and calcification about the dens. Soft tissues and spinal canal: No prevertebral fluid or swelling. No visible canal hematoma. Disc levels: Diffuse cervical disc degeneration, most severe from C5-6 to C7-T1. Advanced facet arthrosis in the upper cervical spine. Advanced neural foraminal stenosis on the left at C4-5 and on the right at C5-6. Upper chest: No apical lung consolidation or mass. Other: 8 mm right thyroid nodule for which no imaging follow-up is recommended. IMPRESSION: 1. Acute bifrontal hemorrhagic contusions and small volume subarachnoid hemorrhage. 2. No acute cervical spine or maxillofacial fracture. 3. Moderate to severe chronic small vessel ischemic disease. Critical Value/emergent results were called by telephone at the time of interpretation on 03/11/2021 at 11:28 am to Dr. Carmin Muskrat, who verbally acknowledged these results. Electronically Signed   By: Logan Bores M.D.   On: 03/11/2021 11:32   CT Maxillofacial WO CM  Result Date: 03/11/2021 CLINICAL DATA:  Polytrauma, critical, head/C-spine injury suspected; Facial trauma. Fall. EXAM: CT HEAD WITHOUT CONTRAST CT MAXILLOFACIAL WITHOUT CONTRAST CT CERVICAL SPINE WITHOUT CONTRAST TECHNIQUE: Multidetector CT imaging of the head, cervical spine, and maxillofacial structures were performed using the standard protocol without intravenous contrast. Multiplanar CT image reconstructions of the cervical spine and maxillofacial structures were also generated. COMPARISON:  Head CT, head and neck CTA, and head MRI 12/31/2020 FINDINGS: CT HEAD FINDINGS Brain: There are multiple small acute parenchymal hemorrhages anteriorly  in the frontal lobes measuring up to 1.9 cm in size with minimal  surrounding edema consistent with hemorrhagic contusions with this history. There is a small amount of acute subarachnoid hemorrhage in the left choroidal fissure, left ambient cistern, and inter hemispheric fissure. No acute cortically based infarct, midline shift, or extra-axial fluid collection is identified. There is moderate cerebral atrophy. Patchy hypodensities in the cerebral white matter bilaterally are unchanged and nonspecific but compatible with moderate to severe chronic small vessel ischemic disease. Vascular: Calcified atherosclerosis at the skull base. No hyperdense vessel. Skull: No fracture or suspicious osseous lesion. Other: None. CT MAXILLOFACIAL FINDINGS Osseous: No acute fracture, mandibular dislocation, or suspicious osseous lesion. Edentulous. Orbits: Bilateral cataract extraction. Mild left periorbital soft tissue swelling. No retrobulbar hematoma. Sinuses: Scattered mild mucosal thickening in the paranasal sinuses. Small volume fluid in the left maxillary sinus. Clear mastoid air cells. Soft tissues: Left facial contusion. CT CERVICAL SPINE FINDINGS Alignment: Chronic mild reversal of the normal cervical lordosis with grade 1 anterolisthesis C3 on C4 in C4 on C5 and grade 1 retrolisthesis of C5 on C6. Skull base and vertebrae: No acute fracture or suspicious osseous lesion. Median C1-2 arthropathy with mild ligamentous thickening and calcification about the dens. Soft tissues and spinal canal: No prevertebral fluid or swelling. No visible canal hematoma. Disc levels: Diffuse cervical disc degeneration, most severe from C5-6 to C7-T1. Advanced facet arthrosis in the upper cervical spine. Advanced neural foraminal stenosis on the left at C4-5 and on the right at C5-6. Upper chest: No apical lung consolidation or mass. Other: 8 mm right thyroid nodule for which no imaging follow-up is recommended. IMPRESSION: 1. Acute bifrontal hemorrhagic contusions and small volume subarachnoid hemorrhage.  2. No acute cervical spine or maxillofacial fracture. 3. Moderate to severe chronic small vessel ischemic disease. Critical Value/emergent results were called by telephone at the time of interpretation on 03/11/2021 at 11:28 am to Dr. Carmin Muskrat, who verbally acknowledged these results. Electronically Signed   By: Logan Bores M.D.   On: 03/11/2021 11:32     Medical Consultants:   None.   Subjective:    Amanda [name] more awake today relates she is hungry.  Objective:    Vitals:   03/12/21 2027 03/13/21 0023 03/13/21 0418 03/13/21 0851  BP: (!) 144/88 (!) 154/87 115/77 (!) 150/86  Pulse: 80 81 87 78  Resp: 19     Temp: 97.7 F (36.5 C) 98.6 F (37 C) 97.6 F (36.4 C) 97.6 F (36.4 C)  TempSrc: Oral Axillary Oral Oral  SpO2: 94% 97% 93% 94%  Weight:      Height:       SpO2: 94 %   Intake/Output Summary (Last 24 hours) at 03/13/2021 0904 Last data filed at 03/13/2021 0418 Gross per 24 hour  Intake 200 ml  Output 250 ml  Net -50 ml    Filed Weights   03/11/21 0737  Weight: 60 kg    Exam: General exam: In no acute distress, traumatic head Respiratory system: Good air movement and clear to auscultation. Cardiovascular system: S1 & S2 heard, RRR. No JVD. Gastrointestinal system: Abdomen is nondistended, soft and nontender.  Extremities: No pedal edema. Skin: No rashes, lesions or ulcers Psychiatry: Judgement and insight appear normal. Mood & affect appropriate.   Data Reviewed:    Labs: Basic Metabolic Panel: Recent Labs  Lab 03/11/21 1255 03/12/21 0248  NA 141 142  K 3.9 3.7  CL 108 109  CO2 26 24  GLUCOSE 106* 96  BUN 13 11  CREATININE 0.83 0.93  CALCIUM 10.2 10.3    GFR Estimated Creatinine Clearance: 34.5 mL/min (by C-G formula based on SCr of 0.93 mg/dL). Liver Function Tests: No results for input(s): AST, ALT, ALKPHOS, BILITOT, PROT, ALBUMIN in the last 168 hours. No results for input(s): LIPASE, AMYLASE in the last 168 hours. No results  for input(s): AMMONIA in the last 168 hours. Coagulation profile No results for input(s): INR, PROTIME in the last 168 hours. COVID-19 Labs  No results for input(s): DDIMER, FERRITIN, LDH, CRP in the last 72 hours.  Lab Results  Component Value Date   SARSCOV2NAA NEGATIVE 03/11/2021   [place] NEGATIVE 12/31/2020   [place] NEGATIVE 04/17/2019    CBC: Recent Labs  Lab 03/11/21 1255 03/12/21 0248  WBC 11.2* 8.4  NEUTROABS 9.2*  --   HGB 12.1 11.7*  HCT 37.1 36.0  MCV 98.9 96.3  PLT 193 175    Cardiac Enzymes: No results for input(s): CKTOTAL, CKMB, CKMBINDEX, TROPONINI in the last 168 hours. BNP (last 3 results) No results for input(s): PROBNP in the last 8760 hours. CBG: Recent Labs  Lab 03/12/21 0616 03/12/21 1240 03/12/21 1637 03/13/21 0026 03/13/21 0622  GLUCAP 92 111* 86 119* 106*    D-Dimer: No results for input(s): DDIMER in the last 72 hours. Hgb A1c: No results for input(s): HGBA1C in the last 72 hours. Lipid Profile: No results for input(s): CHOL, HDL, LDLCALC, TRIG, CHOLHDL, LDLDIRECT in the last 72 hours. Thyroid function studies: No results for input(s): TSH, T4TOTAL, T3FREE, THYROIDAB in the last 72 hours.  Invalid input(s): FREET3 Anemia work up: No results for input(s): VITAMINB12, FOLATE, FERRITIN, TIBC, IRON, RETICCTPCT in the last 72 hours. Sepsis Labs: Recent Labs  Lab 03/11/21 1255 03/12/21 0248  WBC 11.2* 8.4    Microbiology Recent Results (from the past 240 hour(s))  Resp Panel by RT-PCR (Flu A&B, Covid) Nasopharyngeal Swab     Status: None   Collection Time: 03/11/21 12:33 PM   Specimen: Nasopharyngeal Swab; Nasopharyngeal(NP) swabs in vial transport medium  Result Value Ref Range Status   SARS Coronavirus 2 by RT PCR NEGATIVE NEGATIVE Final    Comment: (NOTE) SARS-CoV-2 target nucleic acids are NOT DETECTED.  The SARS-CoV-2 RNA is generally detectable in upper respiratory specimens during the acute phase of  infection. The lowest concentration of SARS-CoV-2 viral copies this assay can detect is 138 copies/mL. A negative result does not preclude SARS-Cov-2 infection and should not be used as the sole basis for treatment or other patient management decisions. A negative result may occur with  improper specimen collection/handling, submission of specimen other than nasopharyngeal swab, presence of viral mutation(s) within the areas targeted by this assay, and inadequate number of viral copies(<138 copies/mL). A negative result must be combined with clinical observations, patient history, and epidemiological information. The expected result is Negative.  Fact Sheet for Patients:  EntrepreneurPulse.com.au  Fact Sheet for Healthcare Providers:  IncredibleEmployment.be  This test is no t yet approved or cleared by the [place] FDA and  has been authorized for detection and/or diagnosis of SARS-CoV-2 by FDA under an Emergency Use Authorization (EUA). This EUA will remain  in effect (meaning this test can be used) for the duration of the COVID-19 declaration under Section 564(b)(1) of the Act, 21 U.S.C.section 360bbb-3(b)(1), unless the authorization is terminated  or revoked sooner.       Influenza A by PCR NEGATIVE NEGATIVE Final   Influenza B by  PCR NEGATIVE NEGATIVE Final    Comment: (NOTE) The Xpert Xpress SARS-CoV-2/FLU/RSV plus assay is intended as an aid in the diagnosis of influenza from Nasopharyngeal swab specimens and should not be used as a sole basis for treatment. Nasal washings and aspirates are unacceptable for Xpert Xpress SARS-CoV-2/FLU/RSV testing.  Fact Sheet for Patients: EntrepreneurPulse.com.au  Fact Sheet for Healthcare Providers: IncredibleEmployment.be  This test is not yet approved or cleared by the [place] FDA and has been authorized for detection and/or diagnosis of SARS-CoV-2  by FDA under an Emergency Use Authorization (EUA). This EUA will remain in effect (meaning this test can be used) for the duration of the COVID-19 declaration under Section 564(b)(1) of the Act, 21 U.S.C. section 360bbb-3(b)(1), unless the authorization is terminated or revoked.  Performed at [facility], [address], [place], [place] [address]      Medications:    amLODipine  10 mg Oral Daily   atorvastatin  20 mg Oral QHS   carvedilol  3.125 mg Oral BID WC   darifenacin  7.5 mg Oral Daily   iron polysaccharides  150 mg Oral Daily   levETIRAcetam  500 mg Oral BID   pantoprazole  40 mg Oral Daily   sodium chloride flush  3 mL Intravenous Q12H   sodium chloride flush  3 mL Intravenous Q12H   Continuous Infusions:  sodium chloride 250 mL (03/11/21 2033)      LOS: 1 day   Charlynne Cousins  [facility]  03/13/2021, 9:04 AM

## 2021-03-14 DIAGNOSIS — S066XAA Traumatic subarachnoid hemorrhage with loss of consciousness status unknown, initial encounter: Secondary | ICD-10-CM | POA: Diagnosis not present

## 2021-03-14 DIAGNOSIS — W19XXXA Unspecified fall, initial encounter: Secondary | ICD-10-CM | POA: Diagnosis not present

## 2021-03-14 LAB — GLUCOSE, CAPILLARY
Glucose-Capillary: 108 mg/dL — ABNORMAL HIGH (ref 70–99)
Glucose-Capillary: 109 mg/dL — ABNORMAL HIGH (ref 70–99)
Glucose-Capillary: 146 mg/dL — ABNORMAL HIGH (ref 70–99)

## 2021-03-14 NOTE — Progress Notes (Signed)
[facility] PROGRESS NOTE    Progress Note  Amanda [name]  KCL:[id number] DOB: [birth year] DOA: 03/11/2021 PCP: [name], DO     Brief Narrative:   Amanda [name] is an 87 y.o. female past medical history significant for prior stroke, dyslipidemia essential hypertension advanced dementia comes into the ED after a significant fall with facial contusion and ecchymosis.  In the ED CT of the head without contrast was done that showed acute bifrontal hemorrhage contusion and small volume subarachnoid hemorrhage CT of the C-spine and maxillofacial showed no fracture or dislocation, the case was discussed with neurosurgery who recommended repeat CT scan on 03/12/2021.  She was also started on IV Keppra and controlling her blood pressure for MAP of 70. The case was discussed with neurosurgery who recommended to repeat a CT scan on 03/12/2021 that showed stable bilateral frontal lobe hemorrhagic intraparenchymal confusion associated edema but no mass-effect.No new infarcts.  Awaiting skilled nursing facility placement Assessment/Plan:   Traumatic intracranial hemorrhage: Likely due to recurrent falls, the patient has had several falls at home. Surgery also recommended Keppra for 7 days. Try  to keep MAP around 70 mmHg. Physical therapy evaluated the patient and recommended skilled nursing facility. No events overnight.  Advance dementia/frequent falls: Patient lives with his son despite this she continues to have recurrent falls. She is at high risk of acute confusional state and aspiration pneumonia. Use Haldol IV as needed for agitation.  Essential hypertension: Continue Norvasc and Coreg pressures been relatively well controlled MAP has been between 100-90.  History of prior CVAs: Holding aspirin due to [place] continue Lipitor.  Ethics/goals of care: Patient is a DNR.  Chronic diastolic heart failure : with an EF of 70% and grade 1 diastolic heart failure, she appears to be  compensated continue Coreg holding off on diuretics.   DVT prophylaxis: scd Family Communication:son Status is: Observation  The patient remains OBS appropriate and will d/c before 2 midnights.  Dispo: The patient is from: Home              Anticipated d/c is to: SNF              Patient currently is not medically stable to d/c.   Difficult to place patient No   Code Status:     Code Status Orders  (From admission, onward)           Start     Ordered   03/11/21 1615  Do not attempt resuscitation (DNR)  Continuous       Question Answer Comment  In the event of cardiac or respiratory ARREST Do not call a "code blue"   In the event of cardiac or respiratory ARREST Do not perform Intubation, CPR, defibrillation or ACLS   In the event of cardiac or respiratory ARREST Use medication by any route, position, wound care, and other measures to relive pain and suffering. May use oxygen, suction and manual treatment of airway obstruction as needed for comfort.   Comments D/w Son and Daughter      03/11/21 1616           Code Status History     Date Active Date Inactive Code Status Order ID Comments User Context   03/11/2021 1138 03/11/2021 1616 DNR [id number]  Carmin Muskrat, MD ED   12/31/2020 2305 01/01/2021 2218 Full Code [id number]  Bethena Roys, MD ED   04/09/2013 1918 04/11/2013 1559 Full Code [id number]  Hosie Poisson, MD Inpatient  IV Access:   Peripheral IV   Procedures and diagnostic studies:   CT HEAD WO CONTRAST (5MM)  Result Date: 03/12/2021 CLINICAL DATA:  Head trauma, intracranial hemorrhage EXAM: CT HEAD WITHOUT CONTRAST TECHNIQUE: Contiguous axial images were obtained from the base of the skull through the vertex without intravenous contrast. COMPARISON:  03/11/2021 FINDINGS: Brain: Multiple areas of intraparenchymal hemorrhage are seen within the bilateral frontal lobes, not appreciably changed since prior study. Largest area of hemorrhage in  the right frontal lobe measures up to 1.5 cm, grossly stable since prior study. There is mild surrounding edema. Small areas of subarachnoid hemorrhage are again seen within the anterior inter hemispheric fissure, left choroidal fissure, and left ambient cistern unchanged. No new areas of hemorrhage or infarct. Lateral ventricles and remaining midline structures are unremarkable. No mass effect. Vascular: No hyperdense vessel or unexpected calcification. Skull: Normal. Negative for fracture or focal lesion. Sinuses/Orbits: Minimal mucosal thickening left maxillary sinus unchanged. Remaining paranasal sinuses are clear. Other: None. IMPRESSION: 1. Stable bilateral frontal lobe hemorrhagic intraparenchymal contusions and associated edema. No mass effect. 2. Stable small volume subarachnoid hemorrhage. 3. No new infarct or additional hemorrhage. Electronically Signed   By: Randa Ngo M.D.   On: 03/12/2021 18:58     Medical Consultants:   None.   Subjective:    Amanda [name] no events overnight she has no complaints  Objective:    Vitals:   03/13/21 0418 03/13/21 0851 03/13/21 2146 03/14/21 0600  BP: 115/77 (!) 150/86 (!) 142/88 135/79  Pulse: 87 78 77 90  Resp:   20 (!) 22  Temp: 97.6 F (36.4 C) 97.6 F (36.4 C) 97.8 F (36.6 C) 98.1 F (36.7 C)  TempSrc: Oral Oral Axillary Axillary  SpO2: 93% 94% 98% 99%  Weight:      Height:       SpO2: 99 %   Intake/Output Summary (Last 24 hours) at 03/14/2021 0944 Last data filed at 03/13/2021 2349 Gross per 24 hour  Intake --  Output 300 ml  Net -300 ml    Filed Weights   03/11/21 0737  Weight: 60 kg    Exam: General exam: In no acute distress. Respiratory system: Good air movement and clear to auscultation. Cardiovascular system: S1 & S2 heard, RRR. No JVD. Gastrointestinal system: Abdomen is nondistended, soft and nontender.  Extremities: No pedal edema. Skin: No rashes, lesions or ulcers  Data Reviewed:    Labs: Basic  Metabolic Panel: Recent Labs  Lab 03/11/21 1255 03/12/21 0248  NA 141 142  K 3.9 3.7  CL 108 109  CO2 26 24  GLUCOSE 106* 96  BUN 13 11  CREATININE 0.83 0.93  CALCIUM 10.2 10.3    GFR Estimated Creatinine Clearance: 34.5 mL/min (by C-G formula based on SCr of 0.93 mg/dL). Liver Function Tests: No results for input(s): AST, ALT, ALKPHOS, BILITOT, PROT, ALBUMIN in the last 168 hours. No results for input(s): LIPASE, AMYLASE in the last 168 hours. No results for input(s): AMMONIA in the last 168 hours. Coagulation profile No results for input(s): INR, PROTIME in the last 168 hours. COVID-19 Labs  No results for input(s): DDIMER, FERRITIN, LDH, CRP in the last 72 hours.  Lab Results  Component Value Date   SARSCOV2NAA NEGATIVE 03/11/2021   [place] NEGATIVE 12/31/2020   [place] NEGATIVE 04/17/2019    CBC: Recent Labs  Lab 03/11/21 1255 03/12/21 0248  WBC 11.2* 8.4  NEUTROABS 9.2*  --   HGB 12.1 11.7*  HCT  37.1 36.0  MCV 98.9 96.3  PLT 193 175    Cardiac Enzymes: No results for input(s): CKTOTAL, CKMB, CKMBINDEX, TROPONINI in the last 168 hours. BNP (last 3 results) No results for input(s): PROBNP in the last 8760 hours. CBG: Recent Labs  Lab 03/13/21 0622 03/13/21 1253 03/13/21 1720 03/13/21 2311 03/14/21 0610  GLUCAP 106* 109* 128* 144* 108*    D-Dimer: No results for input(s): DDIMER in the last 72 hours. Hgb A1c: No results for input(s): HGBA1C in the last 72 hours. Lipid Profile: No results for input(s): CHOL, HDL, LDLCALC, TRIG, CHOLHDL, LDLDIRECT in the last 72 hours. Thyroid function studies: No results for input(s): TSH, T4TOTAL, T3FREE, THYROIDAB in the last 72 hours.  Invalid input(s): FREET3 Anemia work up: No results for input(s): VITAMINB12, FOLATE, FERRITIN, TIBC, IRON, RETICCTPCT in the last 72 hours. Sepsis Labs: Recent Labs  Lab 03/11/21 1255 03/12/21 0248  WBC 11.2* 8.4    Microbiology Recent Results (from the  past 240 hour(s))  Resp Panel by RT-PCR (Flu A&B, Covid) Nasopharyngeal Swab     Status: None   Collection Time: 03/11/21 12:33 PM   Specimen: Nasopharyngeal Swab; Nasopharyngeal(NP) swabs in vial transport medium  Result Value Ref Range Status   SARS Coronavirus 2 by RT PCR NEGATIVE NEGATIVE Final    Comment: (NOTE) SARS-CoV-2 target nucleic acids are NOT DETECTED.  The SARS-CoV-2 RNA is generally detectable in upper respiratory specimens during the acute phase of infection. The lowest concentration of SARS-CoV-2 viral copies this assay can detect is 138 copies/mL. A negative result does not preclude SARS-Cov-2 infection and should not be used as the sole basis for treatment or other patient management decisions. A negative result may occur with  improper specimen collection/handling, submission of specimen other than nasopharyngeal swab, presence of viral mutation(s) within the areas targeted by this assay, and inadequate number of viral copies(<138 copies/mL). A negative result must be combined with clinical observations, patient history, and epidemiological information. The expected result is Negative.  Fact Sheet for Patients:  EntrepreneurPulse.com.au  Fact Sheet for Healthcare Providers:  IncredibleEmployment.be  This test is no t yet approved or cleared by the [place] FDA and  has been authorized for detection and/or diagnosis of SARS-CoV-2 by FDA under an Emergency Use Authorization (EUA). This EUA will remain  in effect (meaning this test can be used) for the duration of the COVID-19 declaration under Section 564(b)(1) of the Act, 21 U.S.C.section 360bbb-3(b)(1), unless the authorization is terminated  or revoked sooner.       Influenza A by PCR NEGATIVE NEGATIVE Final   Influenza B by PCR NEGATIVE NEGATIVE Final    Comment: (NOTE) The Xpert Xpress SARS-CoV-2/FLU/RSV plus assay is intended as an aid in the diagnosis of  influenza from Nasopharyngeal swab specimens and should not be used as a sole basis for treatment. Nasal washings and aspirates are unacceptable for Xpert Xpress SARS-CoV-2/FLU/RSV testing.  Fact Sheet for Patients: EntrepreneurPulse.com.au  Fact Sheet for Healthcare Providers: IncredibleEmployment.be  This test is not yet approved or cleared by the [place] FDA and has been authorized for detection and/or diagnosis of SARS-CoV-2 by FDA under an Emergency Use Authorization (EUA). This EUA will remain in effect (meaning this test can be used) for the duration of the COVID-19 declaration under Section 564(b)(1) of the Act, 21 U.S.C. section 360bbb-3(b)(1), unless the authorization is terminated or revoked.  Performed at [facility], [address], [place], [place] [address]      Medications:  amLODipine  10 mg Oral Daily   atorvastatin  20 mg Oral QHS   carvedilol  3.125 mg Oral BID WC   darifenacin  7.5 mg Oral Daily   iron polysaccharides  150 mg Oral Daily   levETIRAcetam  500 mg Oral Daily   pantoprazole  40 mg Oral Daily   sodium chloride flush  3 mL Intravenous Q12H   sodium chloride flush  3 mL Intravenous Q12H   verapamil  180 mg Oral QHS   Continuous Infusions:  sodium chloride 250 mL (03/11/21 2033)      LOS: 2 days   Charlynne Cousins  [facility]  03/14/2021, 9:44 AM

## 2021-03-15 DIAGNOSIS — W19XXXA Unspecified fall, initial encounter: Secondary | ICD-10-CM | POA: Diagnosis not present

## 2021-03-15 DIAGNOSIS — S066XAA Traumatic subarachnoid hemorrhage with loss of consciousness status unknown, initial encounter: Secondary | ICD-10-CM | POA: Diagnosis not present

## 2021-03-15 LAB — GLUCOSE, CAPILLARY
Glucose-Capillary: 134 mg/dL — ABNORMAL HIGH (ref 70–99)
Glucose-Capillary: 122 mg/dL — ABNORMAL HIGH (ref 70–99)
Glucose-Capillary: 144 mg/dL — ABNORMAL HIGH (ref 70–99)

## 2021-03-15 MED ORDER — LEVETIRACETAM 250 MG PO TABS
250.0000 mg | ORAL_TABLET | Freq: Two times a day (BID) | ORAL | Status: DC
  Administered 2021-03-15 – 2021-03-17 (×6): 250 mg via ORAL
  Filled 2021-03-15 (×7): qty 1

## 2021-03-15 NOTE — TOC Progression Note (Signed)
Transition of Care [facility]) - Progression Note    Patient Details  Name: Amanda [name] MRN: [id number] Date of Birth: [birth year]  Transition of Care [facility]) CM/SW [place], [place] Phone Number: 03/15/2021, 12:56 PM  Clinical Narrative:   CSW received call back from Admissions at [facility], they are going to be able to accept the patient but just waiting to speak with Director of Nursing to determine if patient can go to regular SNF or will be placed in their locked unit because of dementia. CSW awaiting response from [facility]. [place] to [employer] authorization through [employer]. CSW to follow.    Expected Discharge Plan: [place] Barriers to Discharge: Continued Medical Work up, Ship broker  Expected Discharge Plan and Services Expected Discharge Plan: [place] Choice: [place] arrangements for the past 2 months: Single Family Home                                       Social Determinants of Health (SDOH) Interventions    Readmission Risk Interventions No flowsheet data found.

## 2021-03-15 NOTE — Progress Notes (Signed)
Physical Therapy Treatment Patient Details Name: Amanda [name] MRN: [id number] DOB: 1930/08/11 Today's Date: 03/15/2021   History of Present Illness This 87 y.o. female admitted after a significant fall with facial contusion. CT of head showed acute bifrontal hemorrhage, and small volume SAH.   PMH includes: Dementia, CAD, HTN, CVA.    PT Comments    Pt received in supine, agreeable to therapy session and with good participation and tolerance for short gait trials x2 in room, supine/seated BLE exercises for strengthening and transfer training. Pt needing up to minA for transfers with heavy cues for technique and up to [place] for gait with RW short distances. Pt very unsteady and remains a high fall risk due to poor balance, decreased safety awareness and cognitive deficit. Of note, RN notified she does not have her dentures and is having difficulty chewing some of her food from lunch tray (broccoli), pt requesting a softer diet until she gets her dentures from home. Pt continues to benefit from PT services to progress toward functional mobility goals.    Recommendations for follow up therapy are one component of a multi-disciplinary discharge planning process, led by the attending physician.  Recommendations may be updated based on patient status, additional functional criteria and insurance authorization.  Follow Up Recommendations  SNF;Supervision/Assistance - 24 hour     Equipment Recommendations  Other (comment) (TBD by next venue of care)    Recommendations for Other Services       Precautions / Restrictions Precautions Precautions: Fall Precaution Comments: h/o falls per chart Restrictions Weight Bearing Restrictions: No     Mobility  Bed Mobility Overal bed mobility: Needs Assistance Bed Mobility: Supine to Sit;Sit to Supine     Supine to sit: Min assist Sit to supine: Min guard   General bed mobility comments: Assist for trunk elevation to full sitting position. Pt was  able to return to supine without assist.    Transfers Overall transfer level: Needs assistance Equipment used: Rolling walker (2 wheeled) Transfers: Sit to/from Stand Sit to Stand: Min assist         General transfer comment: from EOB and toilet to RW with minA, pt needs mod safety cues prior to standing and reminder to use wall rail from toilet with multimodal cues due to unsafe technique/pt cognitive deficit  Ambulation/Gait Ambulation/Gait assistance: Min assist;Mod assist Gait Distance (Feet): 25 Feet (64ft to toilet, then 50ft around far side of bed) Assistive device: Rolling walker (2 wheeled) Gait Pattern/deviations: Trunk flexed;Narrow base of support;Decreased stride length;Decreased dorsiflexion - left;Decreased dorsiflexion - right;Drifts right/left Gait velocity: Decreased   General Gait Details: Pt with poor posture and management of RW, needs max multimodal cues for technique and safety and frequent brief standing breaks to re-organize posture and bring RW closer to her. Pt internally distracted.   Stairs             Wheelchair Mobility    Modified Rankin (Stroke Patients Only)       Balance Overall balance assessment: Needs assistance Sitting-balance support: Feet supported Sitting balance-Leahy Scale: Fair Sitting balance - Comments: pt able to maintain static sitting with min guard assist, but impulsively leans forward needing up to minA. Pt unable to don socks without assist,   Standing balance support: Single extremity supported;During functional activity Standing balance-Leahy Scale: Poor Standing balance comment: requires min to mod A to maintain balance with dynamic tasks at [facility]  Cognition Arousal/Alertness: Awake/alert Behavior During Therapy: Impulsive;Restless Overall Cognitive Status: No family/caregiver present to determine baseline cognitive functioning Area of Impairment:  Orientation;Memory;Attention;Following commands;Safety/judgement;Awareness;Problem solving                 Orientation Level: Disoriented to;Time;Situation;Place Current Attention Level: Sustained Memory: Decreased short-term memory Following Commands: Follows one step commands consistently Safety/Judgement: Decreased awareness of safety;Decreased awareness of deficits   Problem Solving: Difficulty sequencing;Requires verbal cues;Requires tactile cues General Comments: Pt is easily distracted but redirectable, pt multiple times during session reports she is in [place], [place] and at another hospital, pt surprised to hear she is at [facility] in [place] each time. Pt impulsive frequently trying to stand on her own.      Exercises Other Exercises Other Exercises: supine BLE AROM: ankle pumps, heel slides, hip abduction x10 reps ea Other Exercises: seated BLE AROM: hip flexion, LAQ x10 reps ea Other Exercises: standing BLE AROM hip flexion x10 reps    General Comments        Pertinent Vitals/Pain Pain Assessment: Faces Faces Pain Scale: Hurts little more Pain Location: L hip (bruising) s/p fall and other areas on L side sore Pain Descriptors / Indicators: Discomfort;Sore Pain Intervention(s): Monitored during session;Repositioned    Home Living                      Prior Function            PT Goals (current goals can now be found in the care plan section) Acute Rehab PT Goals Patient Stated Goal: pt unable to state -cognitive deficit PT Goal Formulation: Patient unable to participate in goal setting Time For Goal Achievement: 03/26/21 Potential to Achieve Goals: Fair Progress towards PT goals: Progressing toward goals    Frequency    Min 3X/week      PT Plan Current plan remains appropriate    Co-evaluation              AM-PAC PT "6 Clicks" Mobility   Outcome Measure  Help needed turning from your back to your side while in a flat bed  without using bedrails?: A Little Help needed moving from lying on your back to sitting on the side of a flat bed without using bedrails?: A Little Help needed moving to and from a bed to a chair (including a wheelchair)?: A Little Help needed standing up from a chair using your arms (e.g., wheelchair or bedside chair)?: A Little Help needed to walk in hospital room?: A Little Help needed climbing 3-5 steps with a railing? : A Little 6 Click Score: 18    End of Session Equipment Utilized During Treatment: Gait belt Activity Tolerance: Patient tolerated treatment well Patient left: in bed;with call bell/phone within reach;with bed alarm set;Other (comment);with SCD's reapplied (bed in chair position to eat lunch, Rn notified pt requesting her dentures food is too hard) Nurse Communication: Mobility status;Other (comment) (pt unable to chew broccoli needs softer foods) PT Visit Diagnosis: Unsteadiness on feet (R26.81);Repeated falls (R29.6)     Time: 1126-1208 PT Time Calculation (min) (ACUTE ONLY): 42 min  Charges:  $Gait Training: 8-22 mins $Therapeutic Exercise: 8-22 mins $Therapeutic Activity: 8-22 mins                     Elecia Serafin P., PTA Acute Rehabilitation Services Pager: [phone] Office: [place] 03/15/2021, 1:38 PM

## 2021-03-15 NOTE — Progress Notes (Signed)
[facility] PROGRESS NOTE    Progress Note  Amanda [name]  NTZ:[id number] DOB: September 26, 1930 DOA: 03/11/2021 PCP: [name], DO     Brief Narrative:   Amanda [name] is an 87 y.o. female past medical history significant for prior stroke, dyslipidemia essential hypertension advanced dementia comes into the ED after a significant fall with facial contusion and ecchymosis.  In the ED CT of the head without contrast was done that showed acute bifrontal hemorrhage contusion and small volume subarachnoid hemorrhage CT of the C-spine and maxillofacial showed no fracture or dislocation, the case was discussed with neurosurgery who recommended repeat CT scan on 03/12/2021.  She was also started on IV Keppra and controlling her blood pressure for MAP of 70. The case was discussed with neurosurgery who recommended to repeat a CT scan on 03/12/2021 that showed stable bilateral frontal lobe hemorrhagic intraparenchymal confusion associated edema but no mass-effect.No new infarcts.  Awaiting skilled nursing facility placement Assessment/Plan:   Traumatic intracranial hemorrhage: Likely due to recurrent falls, the patient has had several falls at home. Physical therapy evaluated the patient recommended skilled nursing facility. No events overnight.  Advance dementia/frequent falls: Patient lives with his son despite this she continues to have recurrent falls. She is at high risk of acute confusional state and aspiration pneumonia. Use Haldol IV as needed for agitation. Change her Keppra to immediate release twice a day.  The patient relates she cannot swallow the big pills of Keppra once a day.  Essential hypertension: Continue Norvasc and Coreg pressures been relatively well controlled MAP has been between 100-90.  History of prior CVAs: Holding aspirin due to [place] continue Lipitor.  Ethics/goals of care: Patient is a DNR.  Chronic diastolic heart failure : with an EF of 70% and grade 1  diastolic heart failure, she appears to be compensated continue Coreg holding off on diuretics.   DVT prophylaxis: scd Family Communication:son Status is: Observation  The patient remains OBS appropriate and will d/c before 2 midnights.  Dispo: The patient is from: Home              Anticipated d/c is to: SNF              Patient currently is not medically stable to d/c.   Difficult to place patient No   Code Status:     Code Status Orders  (From admission, onward)           Start     Ordered   03/11/21 1615  Do not attempt resuscitation (DNR)  Continuous       Question Answer Comment  In the event of cardiac or respiratory ARREST Do not call a "code blue"   In the event of cardiac or respiratory ARREST Do not perform Intubation, CPR, defibrillation or ACLS   In the event of cardiac or respiratory ARREST Use medication by any route, position, wound care, and other measures to relive pain and suffering. May use oxygen, suction and manual treatment of airway obstruction as needed for comfort.   Comments D/w Son and Daughter      03/11/21 1616           Code Status History     Date Active Date Inactive Code Status Order ID Comments User Context   03/11/2021 1138 03/11/2021 1616 DNR [id number]  Carmin Muskrat, MD ED   12/31/2020 2305 01/01/2021 2218 Full Code [id number]  Bethena Roys, MD ED   04/09/2013 1918 04/11/2013 1559 Full  Code [id number]  Hosie Poisson, MD Inpatient         IV Access:   Peripheral IV   Procedures and diagnostic studies:   No results found.   Medical Consultants:   None.   Subjective:    Amanda [name] no complaints no events overnight. Objective:    Vitals:   03/14/21 2000 03/15/21 0000 03/15/21 0400 03/15/21 0908  BP: 136/80 (!) 153/85 126/66 119/66  Pulse: 80 80 63 61  Resp: 20 20 20 17   Temp: 98.8 F (37.1 C) 98.4 F (36.9 C) 98.6 F (37 C) 97.6 F (36.4 C)  TempSrc: Oral Oral Oral Oral  SpO2: 93% 93% 95% 99%   Weight:      Height:       SpO2: 99 %   Intake/Output Summary (Last 24 hours) at 03/15/2021 1019 Last data filed at 03/15/2021 0640 Gross per 24 hour  Intake 646 ml  Output 900 ml  Net -254 ml    Filed Weights   03/11/21 0737  Weight: 60 kg    Exam: General exam: In no acute distress. Respiratory system: Good air movement and clear to auscultation. Cardiovascular system: S1 & S2 heard, RRR. No JVD. Gastrointestinal system: Abdomen is nondistended, soft and nontender.  Extremities: No pedal edema. Skin: No rashes, lesions or ulcers  Data Reviewed:    Labs: Basic Metabolic Panel: Recent Labs  Lab 03/11/21 1255 03/12/21 0248  NA 141 142  K 3.9 3.7  CL 108 109  CO2 26 24  GLUCOSE 106* 96  BUN 13 11  CREATININE 0.83 0.93  CALCIUM 10.2 10.3    GFR Estimated Creatinine Clearance: 34.5 mL/min (by C-G formula based on SCr of 0.93 mg/dL). Liver Function Tests: No results for input(s): AST, ALT, ALKPHOS, BILITOT, PROT, ALBUMIN in the last 168 hours. No results for input(s): LIPASE, AMYLASE in the last 168 hours. No results for input(s): AMMONIA in the last 168 hours. Coagulation profile No results for input(s): INR, PROTIME in the last 168 hours. COVID-19 Labs  No results for input(s): DDIMER, FERRITIN, LDH, CRP in the last 72 hours.  Lab Results  Component Value Date   SARSCOV2NAA NEGATIVE 03/11/2021   [place] NEGATIVE 12/31/2020   [place] NEGATIVE 04/17/2019    CBC: Recent Labs  Lab 03/11/21 1255 03/12/21 0248  WBC 11.2* 8.4  NEUTROABS 9.2*  --   HGB 12.1 11.7*  HCT 37.1 36.0  MCV 98.9 96.3  PLT 193 175    Cardiac Enzymes: No results for input(s): CKTOTAL, CKMB, CKMBINDEX, TROPONINI in the last 168 hours. BNP (last 3 results) No results for input(s): PROBNP in the last 8760 hours. CBG: Recent Labs  Lab 03/13/21 2311 03/14/21 0610 03/14/21 1157 03/14/21 2311 03/15/21 0612  GLUCAP 144* 108* 109* 146* 134*    D-Dimer: No  results for input(s): DDIMER in the last 72 hours. Hgb A1c: No results for input(s): HGBA1C in the last 72 hours. Lipid Profile: No results for input(s): CHOL, HDL, LDLCALC, TRIG, CHOLHDL, LDLDIRECT in the last 72 hours. Thyroid function studies: No results for input(s): TSH, T4TOTAL, T3FREE, THYROIDAB in the last 72 hours.  Invalid input(s): FREET3 Anemia work up: No results for input(s): VITAMINB12, FOLATE, FERRITIN, TIBC, IRON, RETICCTPCT in the last 72 hours. Sepsis Labs: Recent Labs  Lab 03/11/21 1255 03/12/21 0248  WBC 11.2* 8.4    Microbiology Recent Results (from the past 240 hour(s))  Resp Panel by RT-PCR (Flu A&B, Covid) Nasopharyngeal Swab     Status: None  Collection Time: 03/11/21 12:33 PM   Specimen: Nasopharyngeal Swab; Nasopharyngeal(NP) swabs in vial transport medium  Result Value Ref Range Status   SARS Coronavirus 2 by RT PCR NEGATIVE NEGATIVE Final    Comment: (NOTE) SARS-CoV-2 target nucleic acids are NOT DETECTED.  The SARS-CoV-2 RNA is generally detectable in upper respiratory specimens during the acute phase of infection. The lowest concentration of SARS-CoV-2 viral copies this assay can detect is 138 copies/mL. A negative result does not preclude SARS-Cov-2 infection and should not be used as the sole basis for treatment or other patient management decisions. A negative result may occur with  improper specimen collection/handling, submission of specimen other than nasopharyngeal swab, presence of viral mutation(s) within the areas targeted by this assay, and inadequate number of viral copies(<138 copies/mL). A negative result must be combined with clinical observations, patient history, and epidemiological information. The expected result is Negative.  Fact Sheet for Patients:  EntrepreneurPulse.com.au  Fact Sheet for Healthcare Providers:  IncredibleEmployment.be  This test is no t yet approved or cleared by  the [place] FDA and  has been authorized for detection and/or diagnosis of SARS-CoV-2 by FDA under an Emergency Use Authorization (EUA). This EUA will remain  in effect (meaning this test can be used) for the duration of the COVID-19 declaration under Section 564(b)(1) of the Act, 21 U.S.C.section 360bbb-3(b)(1), unless the authorization is terminated  or revoked sooner.       Influenza A by PCR NEGATIVE NEGATIVE Final   Influenza B by PCR NEGATIVE NEGATIVE Final    Comment: (NOTE) The Xpert Xpress SARS-CoV-2/FLU/RSV plus assay is intended as an aid in the diagnosis of influenza from Nasopharyngeal swab specimens and should not be used as a sole basis for treatment. Nasal washings and aspirates are unacceptable for Xpert Xpress SARS-CoV-2/FLU/RSV testing.  Fact Sheet for Patients: EntrepreneurPulse.com.au  Fact Sheet for Healthcare Providers: IncredibleEmployment.be  This test is not yet approved or cleared by the [place] FDA and has been authorized for detection and/or diagnosis of SARS-CoV-2 by FDA under an Emergency Use Authorization (EUA). This EUA will remain in effect (meaning this test can be used) for the duration of the COVID-19 declaration under Section 564(b)(1) of the Act, 21 U.S.C. section 360bbb-3(b)(1), unless the authorization is terminated or revoked.  Performed at [facility], [address], [place], [place] [address]      Medications:    amLODipine  10 mg Oral Daily   atorvastatin  20 mg Oral QHS   carvedilol  3.125 mg Oral BID WC   darifenacin  7.5 mg Oral Daily   iron polysaccharides  150 mg Oral Daily   levETIRAcetam  500 mg Oral Daily   pantoprazole  40 mg Oral Daily   sodium chloride flush  3 mL Intravenous Q12H   sodium chloride flush  3 mL Intravenous Q12H   verapamil  180 mg Oral QHS   Continuous Infusions:  sodium chloride 250 mL (03/11/21 2033)      LOS: 3 days   Charlynne Cousins  [facility]  03/15/2021, 10:19 AM

## 2021-03-15 NOTE — Plan of Care (Signed)

## 2021-03-16 DIAGNOSIS — S066XAA Traumatic subarachnoid hemorrhage with loss of consciousness status unknown, initial encounter: Secondary | ICD-10-CM | POA: Diagnosis not present

## 2021-03-16 DIAGNOSIS — W19XXXA Unspecified fall, initial encounter: Secondary | ICD-10-CM | POA: Diagnosis not present

## 2021-03-16 LAB — GLUCOSE, CAPILLARY
Glucose-Capillary: 103 mg/dL — ABNORMAL HIGH (ref 70–99)
Glucose-Capillary: 106 mg/dL — ABNORMAL HIGH (ref 70–99)
Glucose-Capillary: 110 mg/dL — ABNORMAL HIGH (ref 70–99)
Glucose-Capillary: 121 mg/dL — ABNORMAL HIGH (ref 70–99)

## 2021-03-16 NOTE — Progress Notes (Signed)
[facility] PROGRESS NOTE    Progress Note  Amanda [name]  BDZ:[id number] DOB: Feb 16, 1931 DOA: 03/11/2021 PCP: [name], DO     Brief Narrative:   Amanda [name] is an 87 y.o. female past medical history significant for prior stroke, dyslipidemia essential hypertension advanced dementia comes into the ED after a significant fall with facial contusion and ecchymosis.  In the ED CT of the head without contrast was done that showed acute bifrontal hemorrhage contusion and small volume subarachnoid hemorrhage CT of the C-spine and maxillofacial showed no fracture or dislocation, the case was discussed with neurosurgery who recommended repeat CT scan on 03/12/2021.  She was also started on IV Keppra and controlling her blood pressure for MAP of 70. The case was discussed with neurosurgery who recommended to repeat a CT scan on 03/12/2021 that showed stable bilateral frontal lobe hemorrhagic intraparenchymal confusion associated edema but no mass-effect.No new infarcts.  Awaiting insurance authorization Assessment/Plan:   Traumatic intracranial hemorrhage: Likely due to recurrent falls, the patient has had several falls at home. Physical therapy evaluated the patient recommended skilled nursing facility. No events overnight.  Advance dementia/frequent falls: Patient lives with his son despite this she continues to have recurrent falls. She is at high risk of acute confusional state and aspiration pneumonia. Use Haldol IV as needed for agitation. Change her Keppra to immediate release twice a day.  The patient relates she cannot swallow the big pills of Keppra once a day.  Essential hypertension: Continue Norvasc and Coreg pressures been relatively well controlled MAP has been between 100-90.  History of prior CVAs: Holding aspirin due to [place] continue Lipitor.  Ethics/goals of care: Patient is a DNR.  Chronic diastolic heart failure : with an EF of 70% and grade 1 diastolic  heart failure, she appears to be compensated continue Coreg holding off on diuretics.   DVT prophylaxis: scd Family Communication:son Status is: Observation  The patient remains OBS appropriate and will d/c before 2 midnights.  Dispo: The patient is from: Home              Anticipated d/c is to: SNF              Patient currently is not medically stable to d/c.   Difficult to place patient No   Code Status:     Code Status Orders  (From admission, onward)           Start     Ordered   03/11/21 1615  Do not attempt resuscitation (DNR)  Continuous       Question Answer Comment  In the event of cardiac or respiratory ARREST Do not call a "code blue"   In the event of cardiac or respiratory ARREST Do not perform Intubation, CPR, defibrillation or ACLS   In the event of cardiac or respiratory ARREST Use medication by any route, position, wound care, and other measures to relive pain and suffering. May use oxygen, suction and manual treatment of airway obstruction as needed for comfort.   Comments D/w Son and Daughter      03/11/21 1616           Code Status History     Date Active Date Inactive Code Status Order ID Comments User Context   03/11/2021 1138 03/11/2021 1616 DNR [id number]  Carmin Muskrat, MD ED   12/31/2020 2305 01/01/2021 2218 Full Code [id number]  Bethena Roys, MD ED   04/09/2013 1918 04/11/2013 1559 Full Code [id number]  Hosie Poisson, MD Inpatient         IV Access:   Peripheral IV   Procedures and diagnostic studies:   No results found.   Medical Consultants:   None.   Subjective:    Amanda [name] events overnight. Objective:    Vitals:   03/15/21 1656 03/15/21 2000 03/16/21 0000 03/16/21 0400  BP: 118/66 131/67 122/68 (!) 148/80  Pulse: 65 63 66 73  Resp: 20 16 16 16   Temp: (!) 97.5 F (36.4 C) 98.4 F (36.9 C) 98.2 F (36.8 C) 98 F (36.7 C)  TempSrc: Oral Oral Oral Oral  SpO2: 95% 98% 96% 93%  Weight:      Height:        SpO2: 93 %   Intake/Output Summary (Last 24 hours) at 03/16/2021 0810 Last data filed at 03/15/2021 2315 Gross per 24 hour  Intake 403 ml  Output 275 ml  Net 128 ml    Filed Weights   03/11/21 0737  Weight: 60 kg    Exam: General exam: In no acute distress. Respiratory system: Good air movement and clear to auscultation. Cardiovascular system: S1 & S2 heard, RRR. No JVD. Gastrointestinal system: Abdomen is nondistended, soft and nontender.  Extremities: No pedal edema. Skin: No rashes, lesions or ulcers  Data Reviewed:    Labs: Basic Metabolic Panel: Recent Labs  Lab 03/11/21 1255 03/12/21 0248  NA 141 142  K 3.9 3.7  CL 108 109  CO2 26 24  GLUCOSE 106* 96  BUN 13 11  CREATININE 0.83 0.93  CALCIUM 10.2 10.3    GFR Estimated Creatinine Clearance: 34.5 mL/min (by C-G formula based on SCr of 0.93 mg/dL). Liver Function Tests: No results for input(s): AST, ALT, ALKPHOS, BILITOT, PROT, ALBUMIN in the last 168 hours. No results for input(s): LIPASE, AMYLASE in the last 168 hours. No results for input(s): AMMONIA in the last 168 hours. Coagulation profile No results for input(s): INR, PROTIME in the last 168 hours. COVID-19 Labs  No results for input(s): DDIMER, FERRITIN, LDH, CRP in the last 72 hours.  Lab Results  Component Value Date   SARSCOV2NAA NEGATIVE 03/11/2021   [place] NEGATIVE 12/31/2020   [place] NEGATIVE 04/17/2019    CBC: Recent Labs  Lab 03/11/21 1255 03/12/21 0248  WBC 11.2* 8.4  NEUTROABS 9.2*  --   HGB 12.1 11.7*  HCT 37.1 36.0  MCV 98.9 96.3  PLT 193 175    Cardiac Enzymes: No results for input(s): CKTOTAL, CKMB, CKMBINDEX, TROPONINI in the last 168 hours. BNP (last 3 results) No results for input(s): PROBNP in the last 8760 hours. CBG: Recent Labs  Lab 03/15/21 0612 03/15/21 1216 03/15/21 1702 03/16/21 0041 03/16/21 0609  GLUCAP 134* 122* 144* 110* 106*    D-Dimer: No results for input(s): DDIMER in  the last 72 hours. Hgb A1c: No results for input(s): HGBA1C in the last 72 hours. Lipid Profile: No results for input(s): CHOL, HDL, LDLCALC, TRIG, CHOLHDL, LDLDIRECT in the last 72 hours. Thyroid function studies: No results for input(s): TSH, T4TOTAL, T3FREE, THYROIDAB in the last 72 hours.  Invalid input(s): FREET3 Anemia work up: No results for input(s): VITAMINB12, FOLATE, FERRITIN, TIBC, IRON, RETICCTPCT in the last 72 hours. Sepsis Labs: Recent Labs  Lab 03/11/21 1255 03/12/21 0248  WBC 11.2* 8.4    Microbiology Recent Results (from the past 240 hour(s))  Resp Panel by RT-PCR (Flu A&B, Covid) Nasopharyngeal Swab     Status: None   Collection Time: 03/11/21  12:33 PM   Specimen: Nasopharyngeal Swab; Nasopharyngeal(NP) swabs in vial transport medium  Result Value Ref Range Status   SARS Coronavirus 2 by RT PCR NEGATIVE NEGATIVE Final    Comment: (NOTE) SARS-CoV-2 target nucleic acids are NOT DETECTED.  The SARS-CoV-2 RNA is generally detectable in upper respiratory specimens during the acute phase of infection. The lowest concentration of SARS-CoV-2 viral copies this assay can detect is 138 copies/mL. A negative result does not preclude SARS-Cov-2 infection and should not be used as the sole basis for treatment or other patient management decisions. A negative result may occur with  improper specimen collection/handling, submission of specimen other than nasopharyngeal swab, presence of viral mutation(s) within the areas targeted by this assay, and inadequate number of viral copies(<138 copies/mL). A negative result must be combined with clinical observations, patient history, and epidemiological information. The expected result is Negative.  Fact Sheet for Patients:  EntrepreneurPulse.com.au  Fact Sheet for Healthcare Providers:  IncredibleEmployment.be  This test is no t yet approved or cleared by the [place] FDA and  has  been authorized for detection and/or diagnosis of SARS-CoV-2 by FDA under an Emergency Use Authorization (EUA). This EUA will remain  in effect (meaning this test can be used) for the duration of the COVID-19 declaration under Section 564(b)(1) of the Act, 21 U.S.C.section 360bbb-3(b)(1), unless the authorization is terminated  or revoked sooner.       Influenza A by PCR NEGATIVE NEGATIVE Final   Influenza B by PCR NEGATIVE NEGATIVE Final    Comment: (NOTE) The Xpert Xpress SARS-CoV-2/FLU/RSV plus assay is intended as an aid in the diagnosis of influenza from Nasopharyngeal swab specimens and should not be used as a sole basis for treatment. Nasal washings and aspirates are unacceptable for Xpert Xpress SARS-CoV-2/FLU/RSV testing.  Fact Sheet for Patients: EntrepreneurPulse.com.au  Fact Sheet for Healthcare Providers: IncredibleEmployment.be  This test is not yet approved or cleared by the [place] FDA and has been authorized for detection and/or diagnosis of SARS-CoV-2 by FDA under an Emergency Use Authorization (EUA). This EUA will remain in effect (meaning this test can be used) for the duration of the COVID-19 declaration under Section 564(b)(1) of the Act, 21 U.S.C. section 360bbb-3(b)(1), unless the authorization is terminated or revoked.  Performed at [facility], [address], [place], [place] [address]      Medications:    amLODipine  10 mg Oral Daily   atorvastatin  20 mg Oral QHS   carvedilol  3.125 mg Oral BID WC   darifenacin  7.5 mg Oral Daily   iron polysaccharides  150 mg Oral Daily   levETIRAcetam  250 mg Oral BID   pantoprazole  40 mg Oral Daily   sodium chloride flush  3 mL Intravenous Q12H   sodium chloride flush  3 mL Intravenous Q12H   verapamil  180 mg Oral QHS   Continuous Infusions:  sodium chloride 250 mL (03/11/21 2033)      LOS: 4 days   Charlynne Cousins  [facility]  03/16/2021, 8:10 AM

## 2021-03-17 DIAGNOSIS — S066XAD Traumatic subarachnoid hemorrhage with loss of consciousness status unknown, subsequent encounter: Secondary | ICD-10-CM | POA: Diagnosis not present

## 2021-03-17 DIAGNOSIS — G319 Degenerative disease of nervous system, unspecified: Secondary | ICD-10-CM | POA: Diagnosis not present

## 2021-03-17 DIAGNOSIS — Z8673 Personal history of transient ischemic attack (TIA), and cerebral infarction without residual deficits: Secondary | ICD-10-CM | POA: Diagnosis not present

## 2021-03-17 MED ORDER — AMLODIPINE BESYLATE 5 MG PO TABS
5.0000 mg | ORAL_TABLET | Freq: Every day | ORAL | Status: DC
  Administered 2021-03-17 – 2021-03-18 (×2): 5 mg via ORAL
  Filled 2021-03-17 (×2): qty 1

## 2021-03-17 NOTE — Progress Notes (Signed)
Physical Therapy Treatment Patient Details Name: Amanda [name] BACHTELL MRN: [id number] DOB: 01-18-1931 Today's Date: 03/17/2021   History of Present Illness This 87 y.o. female admitted after a significant fall with facial contusion. CT of head showed acute bifrontal hemorrhage, and small volume SAH.   PMH includes: Dementia, CAD, HTN, CVA.    PT Comments    Pt received in supine, lethargic but able to be awoken and with good participation and fair tolerance for bed mobility and transfer training. Pt unable to follow instructions for weight shifting/stepping at bedside and only able to tolerate standing ~10 seconds at a time, RN notified as this is a functional decline from previous session. Pt BP taken supine/seated and stable, see comments below. Pt continues to benefit from PT services to progress toward functional mobility goals.    Recommendations for follow up therapy are one component of a multi-disciplinary discharge planning process, led by the attending physician.  Recommendations may be updated based on patient status, additional functional criteria and insurance authorization.  Follow Up Recommendations  SNF;Supervision/Assistance - 24 hour     Equipment Recommendations  Other (comment) (TBD by next venue of care)    Recommendations for Other Services       Precautions / Restrictions Precautions Precautions: Fall Precaution Comments: h/o falls per chart Restrictions Weight Bearing Restrictions: No     Mobility  Bed Mobility Overal bed mobility: Needs Assistance Bed Mobility: Sit to Supine;Rolling;Sidelying to Sit Rolling: Mod assist Sidelying to sit: Max assist;HOB elevated   Sit to supine: Max assist;+2 for physical assistance   General bed mobility comments: trunk and BLE assist needed due to lethargy/cognitive deficit    Transfers Overall transfer level: Needs assistance Equipment used: Rolling walker (2 wheeled) Transfers: Sit to/from Stand Sit to Stand: Max  assist;+2 physical assistance;From elevated surface         General transfer comment: cues for hand placement and maxA to achieve upright x3 trials, once standing within RW pt unable to weight shift and sits impulsively  Ambulation/Gait     General Gait Details: pt unable to follow instructions this date for weight shifting in stance, unable to progress gait     Balance Overall balance assessment: Needs assistance Sitting-balance support: Feet supported Sitting balance-Leahy Scale: Poor Sitting balance - Comments: min to modA for safety, pt lethargic and at times leaning to posterior/L side   Standing balance support: Bilateral upper extremity supported Standing balance-Leahy Scale: Zero Standing balance comment: posterior lean in stance, maxA +1-2 support within RW, pt unable to take steps or weight shift in stance                            Cognition Arousal/Alertness: Lethargic Behavior During Therapy: Flat affect Overall Cognitive Status: No family/caregiver present to determine baseline cognitive functioning Area of Impairment: Orientation;Memory;Attention;Following commands;Safety/judgement;Awareness;Problem solving                 Orientation Level: Disoriented to;Time;Situation;Place Current Attention Level: Sustained Memory: Decreased short-term memory Following Commands: Follows one step commands consistently Safety/Judgement: Decreased awareness of safety;Decreased awareness of deficits   Problem Solving: Difficulty sequencing;Requires verbal cues;Requires tactile cues;Slow processing;Decreased initiation General Comments: Pt with functional decline this day but possibly due to fatigue, pt very lethargic and difficulty following simple 1-step instructions, needing increased assist and cues to perform each task. Pt agreeable to attempt ambulation but then unable to weight shift when standing in RW;  Exercises      General Comments General  comments (skin integrity, edema, etc.): BP 123/70 seated; BP 124/75 supine, no dizziness reported; lethargic throughout, blinds left open and lights on to try to normalize day/night awareness and sleep cycle      Pertinent Vitals/Pain Pain Assessment: Faces Faces Pain Scale: Hurts even more Pain Location: generalized, pt unable to localize 2/2 cognition but seems to c/o pain at B hips this date when performing rolling to L/R sides and bed mobility. Pain Descriptors / Indicators: Discomfort;Sore;Grimacing Pain Intervention(s): Limited activity within patient's tolerance;Monitored during session;Repositioned    Home Living                      Prior Function            PT Goals (current goals can now be found in the care plan section) Acute Rehab PT Goals Patient Stated Goal: pt unable to state -cognitive deficit PT Goal Formulation: Patient unable to participate in goal setting Time For Goal Achievement: 03/26/21 Potential to Achieve Goals: Fair Progress towards PT goals: Progressing toward goals    Frequency    Min 3X/week      PT Plan Current plan remains appropriate    Co-evaluation              AM-PAC PT "6 Clicks" Mobility   Outcome Measure  Help needed turning from your back to your side while in a flat bed without using bedrails?: A Lot Help needed moving from lying on your back to sitting on the side of a flat bed without using bedrails?: A Lot Help needed moving to and from a bed to a chair (including a wheelchair)?: Total Help needed standing up from a chair using your arms (e.g., wheelchair or bedside chair)?: Total Help needed to walk in hospital room?: Total Help needed climbing 3-5 steps with a railing? : Total 6 Click Score: 8    End of Session Equipment Utilized During Treatment: Gait belt Activity Tolerance: Patient limited by lethargy Patient left: in bed;with call bell/phone within reach;with bed alarm set;with SCD's reapplied Nurse  Communication: Mobility status;Other (comment);Precautions (pt lethargic) PT Visit Diagnosis: Unsteadiness on feet (R26.81);Repeated falls (R29.6)     Time: 6468-0321 PT Time Calculation (min) (ACUTE ONLY): 20 min  Charges:  $Therapeutic Activity: 8-22 mins                     Quanah Majka P., PTA Acute Rehabilitation Services Pager: [phone] Office: [place] 03/17/2021, 6:43 PM

## 2021-03-17 NOTE — TOC Progression Note (Signed)
Transition of Care [facility]) - Progression Note    Patient Details  Name: Amanda [name] MRN: [id number] Date of Birth: [birth year]  Transition of Care [facility]) CM/SW [place] Phone Number: 03/17/2021, 10:23 AM  Clinical Narrative:   CSW left a voicemail with [facility] to confirm that they had initiated insurance authorization for patient, awaiting response.     Expected Discharge Plan: [place] Barriers to Discharge: Continued Medical Work up, Ship broker  Expected Discharge Plan and Services Expected Discharge Plan: [place] Choice: [place] arrangements for the past 2 months: Single Family Home                                       Social Determinants of Health (SDOH) Interventions    Readmission Risk Interventions No flowsheet data found.

## 2021-03-17 NOTE — Progress Notes (Signed)
[facility] PROGRESS NOTE    Progress Note  Amanda [name]  OXB:[id number] DOB: 06-Apr-1931 DOA: 03/11/2021 PCP: [name], DO     Brief Narrative:   Amanda [name] is an 87 y.o. female past medical history significant for prior stroke, dyslipidemia essential hypertension advanced dementia comes into the ED after a significant fall with facial contusion and ecchymosis.  In the ED CT of the head without contrast was done that showed acute bifrontal hemorrhage contusion and small volume subarachnoid hemorrhage.  Seen by neurosurgery in consultation, recommended conservative management and Keppra X 7 days, repeat head CT was stable. [facility] course complicated by delirium -Now remained stable Awaiting insurance authorization Assessment/Plan:   Traumatic intracranial hemorrhage: -Seen by neurosurgery in consultation, recommended conservative management and Keppra X 7 days -Aspirin was discontinued  -Repeat CT head 10/7 stable  -PT OT eval completed, SNF recommended  -Will discontinue Keppra at discharge -TOC following, discharge planning, SNF when bed available  Advance dementia/frequent falls: Patient lives with his son, history of frequent falls and cognitive deficits Now plan for SNF  Essential hypertension: -On a regimen of amlodipine, Coreg and verapamil, blood pressures are stable -Will taper off amlodipine  History of prior CVAs: Holding aspirin due to [place] continue Lipitor.  Ethics/goals of care: Patient is a DNR.  Chronic diastolic heart failure : with an EF of 70% and grade 1 diastolic heart failure -Clinically appears euvolemic, continue Coreg, diuretics on hold  DVT prophylaxis: scd CODE STATUS: DNR Family Communication: No family at bedside Status is: Inpatient Dispo: The patient is from: Home              Anticipated d/c is to: SNF              Patient currently is medically stable to d/c.   Difficult to place patient No   Subjective:    Amanda  [name] : No events overnight per staff, oral intake remains somewhat poor Objective:    Vitals:   03/16/21 2346 03/17/21 0326 03/17/21 0730 03/17/21 1116  BP: (!) 141/78 (!) 131/105 (!) 142/80 138/70  Pulse: 75 75 76 64  Resp: (!) 25 17 14 14   Temp: 98.1 F (36.7 C) 98.1 F (36.7 C) 98.3 F (36.8 C) 98.3 F (36.8 C)  TempSrc: Oral Oral Oral Oral  SpO2: 95% 93% 91% 90%  Weight:      Height:       SpO2: 90 %   Intake/Output Summary (Last 24 hours) at 03/17/2021 1123 Last data filed at 03/17/2021 0800 Gross per 24 hour  Intake 400 ml  Output --  Net 400 ml   Filed Weights   03/11/21 0737  Weight: 60 kg    Exam: General exam: Elderly frail female laying in bed, awake alert, oriented to self and partly to place only, mild cognitive deficits noted CVS: S1-S2, regular rate rhythm Lungs: Clear bilaterally Abdomen: Soft, nontender, bowel sounds present Extremities: No edema, increased stiffness in upper and lower extremities Skin: No rashes on exposed skin Psych: Poor insight and judgment  Data Reviewed:    Labs: Basic Metabolic Panel: Recent Labs  Lab 03/11/21 1255 03/12/21 0248  NA 141 142  K 3.9 3.7  CL 108 109  CO2 26 24  GLUCOSE 106* 96  BUN 13 11  CREATININE 0.83 0.93  CALCIUM 10.2 10.3   GFR Estimated Creatinine Clearance: 34.5 mL/min (by C-G formula based on SCr of 0.93 mg/dL). Liver Function Tests: No results for  input(s): AST, ALT, ALKPHOS, BILITOT, PROT, ALBUMIN in the last 168 hours. No results for input(s): LIPASE, AMYLASE in the last 168 hours. No results for input(s): AMMONIA in the last 168 hours. Coagulation profile No results for input(s): INR, PROTIME in the last 168 hours. COVID-19 Labs  No results for input(s): DDIMER, FERRITIN, LDH, CRP in the last 72 hours.  Lab Results  Component Value Date   SARSCOV2NAA NEGATIVE 03/11/2021   [place] NEGATIVE 12/31/2020   [place] NEGATIVE 04/17/2019    CBC: Recent Labs  Lab  03/11/21 1255 03/12/21 0248  WBC 11.2* 8.4  NEUTROABS 9.2*  --   HGB 12.1 11.7*  HCT 37.1 36.0  MCV 98.9 96.3  PLT 193 175   Cardiac Enzymes: No results for input(s): CKTOTAL, CKMB, CKMBINDEX, TROPONINI in the last 168 hours. BNP (last 3 results) No results for input(s): PROBNP in the last 8760 hours. CBG: Recent Labs  Lab 03/15/21 1702 03/16/21 0041 03/16/21 0609 03/16/21 1209 03/16/21 1621  GLUCAP 144* 110* 106* 121* 103*   D-Dimer: No results for input(s): DDIMER in the last 72 hours. Hgb A1c: No results for input(s): HGBA1C in the last 72 hours. Lipid Profile: No results for input(s): CHOL, HDL, LDLCALC, TRIG, CHOLHDL, LDLDIRECT in the last 72 hours. Thyroid function studies: No results for input(s): TSH, T4TOTAL, T3FREE, THYROIDAB in the last 72 hours.  Invalid input(s): FREET3 Anemia work up: No results for input(s): VITAMINB12, FOLATE, FERRITIN, TIBC, IRON, RETICCTPCT in the last 72 hours. Sepsis Labs: Recent Labs  Lab 03/11/21 1255 03/12/21 0248  WBC 11.2* 8.4   Microbiology Recent Results (from the past 240 hour(s))  Resp Panel by RT-PCR (Flu A&B, Covid) Nasopharyngeal Swab     Status: None   Collection Time: 03/11/21 12:33 PM   Specimen: Nasopharyngeal Swab; Nasopharyngeal(NP) swabs in vial transport medium  Result Value Ref Range Status   SARS Coronavirus 2 by RT PCR NEGATIVE NEGATIVE Final    Comment: (NOTE) SARS-CoV-2 target nucleic acids are NOT DETECTED.  The SARS-CoV-2 RNA is generally detectable in upper respiratory specimens during the acute phase of infection. The lowest concentration of SARS-CoV-2 viral copies this assay can detect is 138 copies/mL. A negative result does not preclude SARS-Cov-2 infection and should not be used as the sole basis for treatment or other patient management decisions. A negative result may occur with  improper specimen collection/handling, submission of specimen other than nasopharyngeal swab, presence of  viral mutation(s) within the areas targeted by this assay, and inadequate number of viral copies(<138 copies/mL). A negative result must be combined with clinical observations, patient history, and epidemiological information. The expected result is Negative.  Fact Sheet for Patients:  EntrepreneurPulse.com.au  Fact Sheet for Healthcare Providers:  IncredibleEmployment.be  This test is no t yet approved or cleared by the [place] FDA and  has been authorized for detection and/or diagnosis of SARS-CoV-2 by FDA under an Emergency Use Authorization (EUA). This EUA will remain  in effect (meaning this test can be used) for the duration of the COVID-19 declaration under Section 564(b)(1) of the Act, 21 U.S.C.section 360bbb-3(b)(1), unless the authorization is terminated  or revoked sooner.       Influenza A by PCR NEGATIVE NEGATIVE Final   Influenza B by PCR NEGATIVE NEGATIVE Final    Comment: (NOTE) The Xpert Xpress SARS-CoV-2/FLU/RSV plus assay is intended as an aid in the diagnosis of influenza from Nasopharyngeal swab specimens and should not be used as a sole basis for treatment. Nasal washings  and aspirates are unacceptable for Xpert Xpress SARS-CoV-2/FLU/RSV testing.  Fact Sheet for Patients: EntrepreneurPulse.com.au  Fact Sheet for Healthcare Providers: IncredibleEmployment.be  This test is not yet approved or cleared by the [place] FDA and has been authorized for detection and/or diagnosis of SARS-CoV-2 by FDA under an Emergency Use Authorization (EUA). This EUA will remain in effect (meaning this test can be used) for the duration of the COVID-19 declaration under Section 564(b)(1) of the Act, 21 U.S.C. section 360bbb-3(b)(1), unless the authorization is terminated or revoked.  Performed at [facility], [address] [place], [place] [address]      Medications:    amLODipine  10  mg Oral Daily   atorvastatin  20 mg Oral QHS   carvedilol  3.125 mg Oral BID WC   darifenacin  7.5 mg Oral Daily   iron polysaccharides  150 mg Oral Daily   levETIRAcetam  250 mg Oral BID   pantoprazole  40 mg Oral Daily   sodium chloride flush  3 mL Intravenous Q12H   sodium chloride flush  3 mL Intravenous Q12H   verapamil  180 mg Oral QHS   Continuous Infusions:  sodium chloride 250 mL (03/11/21 2033)      LOS: 5 days   Domenic Polite  [facility]  03/17/2021, 11:23 AM

## 2021-03-17 NOTE — Progress Notes (Signed)
OT Cancellation Note  Patient Details Name: Amanda [name] MRN: [id number] DOB: 15-Jun-1930   Cancelled Treatment:    Reason Eval/Treat Not Completed: Fatigue/lethargy limiting ability to participate (Pt asleep upon arrival, would not open her eyes or follow commands. RN aware. OT treat to f/u as appropriate.)  [name] A [name] 03/17/2021, 4:58 PM

## 2021-03-18 DIAGNOSIS — Z8673 Personal history of transient ischemic attack (TIA), and cerebral infarction without residual deficits: Secondary | ICD-10-CM | POA: Diagnosis not present

## 2021-03-18 DIAGNOSIS — G319 Degenerative disease of nervous system, unspecified: Secondary | ICD-10-CM | POA: Diagnosis not present

## 2021-03-18 DIAGNOSIS — S066XAD Traumatic subarachnoid hemorrhage with loss of consciousness status unknown, subsequent encounter: Secondary | ICD-10-CM | POA: Diagnosis not present

## 2021-03-18 NOTE — Progress Notes (Addendum)
Physical Therapy Treatment Patient Details Name: Amanda [name] MRN: [id number] DOB: 1931-04-19 Today's Date: 03/18/2021   History of Present Illness This 87 y.o. female admitted after a significant fall with facial contusion. CT of head showed acute bifrontal hemorrhage, and small volume SAH.   PMH includes: Dementia, CAD, HTN, CVA.    PT Comments    Pt received in supine, agreeable to therapy session and with good participation and tolerance for balance and transfer training. Pt limited due to cognitive deficit and urinary incontinence (purewick donned throughout) and unable to sequence steps well so deferred gait trial and instead focus on standing strengthening tasks at bedside and pivoting with RW and modA. RN notified and obtained Amanda [name] for patient, she will need at least +1 for stand pivot with RW or +2 with [place]. Pt continues to benefit from PT services to progress toward functional mobility goals.    Recommendations for follow up therapy are one component of a multi-disciplinary discharge planning process, led by the attending physician.  Recommendations may be updated based on patient status, additional functional criteria and insurance authorization.  Follow Up Recommendations  SNF;Supervision/Assistance - 24 hour     Equipment Recommendations  Other (comment) (TBD by next venue of care)    Recommendations for Other Services       Precautions / Restrictions Precautions Precautions: Fall Precaution Comments: h/o falls per chart Restrictions Weight Bearing Restrictions: No     Mobility  Bed Mobility Overal bed mobility: Needs Assistance Bed Mobility: Rolling;Sidelying to Sit Rolling: Min assist Sidelying to sit: Max assist;HOB elevated    General bed mobility comments: trunk and BLE assist needed due to drowsiness and cognitive deficit    Transfers Overall transfer level: Needs assistance Equipment used: Rolling walker (2 wheeled) Transfers: Sit to/from [place] Sit to Stand: From elevated surface;Min assist;Mod assist Stand pivot transfers: Mod assist       General transfer comment: cues for hand placement and min to modA to achieve upright x8 total trials from EOB/chair heights to RW; poor carryover of hand placement cues; limited to stand pivot with RW and modA as pt impulsive to sit  Ambulation/Gait    General Gait Details: pt quick to fatigue and lethargic, able to perform pivotal steps to chair and pre-gait hip flexion at RW but impulsive to sit after 5-10 reps so defer longer gait trial for safety       Modified Rankin (Stroke Patients Only) Modified Rankin (Stroke Patients Only) Pre-Morbid Rankin Score: Moderate disability Modified Rankin: Moderately severe disability     Balance Overall balance assessment: Needs assistance Sitting-balance support: Feet supported Sitting balance-Leahy Scale: Poor Sitting balance - Comments: min guard at least up to minA for safety due to pt impulsivity/cognition   Standing balance support: Bilateral upper extremity supported Standing balance-Leahy Scale: Poor Standing balance comment: mild posterior lean in stance      Cognition Arousal/Alertness:  (drowsy on/off, less lethargic than previous date) Behavior During Therapy: Impulsive Overall Cognitive Status: No family/caregiver present to determine baseline cognitive functioning Area of Impairment: Orientation;Memory;Attention;Following commands;Safety/judgement;Awareness;Problem solving      Orientation Level: Disoriented to;Time;Situation;Place Current Attention Level: Sustained Memory: Decreased short-term memory;Decreased recall of precautions Following Commands: Follows one step commands consistently;Follows one step commands with increased time Safety/Judgement: Decreased awareness of safety;Decreased awareness of deficits Awareness: Intellectual Problem Solving: Difficulty sequencing;Requires verbal cues;Requires  tactile cues;Slow processing;Decreased initiation General Comments: Pt more alert today than previous date, A&O to self only, following simple 1-step instructions,  needing increased assist and cues to perform each task. Pt agreeable to attempt ambulation but incontinent upon standing and difficulty sequencing steps so remained close to bedside. RN obtained Amanda [name] per PTA request for safety due to baseline dementia/incontinence.      Exercises Other Exercises Other Exercises: standing BLE AROM hip flexion x10 reps x2 sets with seated break Other Exercises: standing BLE AROM: heel raises, mini squats x10 reps    General Comments General comments (skin integrity, edema, etc.): no acute s/sx distress, chair alarm unit obtained for her room (green box) and activated for safety, pt reoriented to situation/use of call bell but NT notified to check on her frequently due to cognitive deficit; pt step-son in room initially and asking to be notified by case mgmt what the plan is for discharge, with his permission gave his new mobile contact # to case manager and entered into chart.      Pertinent Vitals/Pain Pain Assessment: Faces Faces Pain Scale: Hurts a little bit Pain Location: generalized, pt unable to localize 2/2 cognition but bruising noted to L side of body (hip/arm/face) Pain Descriptors / Indicators: Grimacing Pain Intervention(s): Limited activity within patient's tolerance;Monitored during session;Repositioned     PT Goals (current goals can now be found in the care plan section) Acute Rehab PT Goals Patient Stated Goal: pt unable to state -cognitive deficit PT Goal Formulation: Patient unable to participate in goal setting Time For Goal Achievement: 03/26/21 Potential to Achieve Goals: Fair Progress towards PT goals: Progressing toward goals    Frequency    Min 3X/week      PT Plan Current plan remains appropriate    AM-PAC PT "6 Clicks" Mobility   Outcome Measure  Help  needed turning from your back to your side while in a flat bed without using bedrails?: A Little Help needed moving from lying on your back to sitting on the side of a flat bed without using bedrails?: A Lot Help needed moving to and from a bed to a chair (including a wheelchair)?: A Lot Help needed standing up from a chair using your arms (e.g., wheelchair or bedside chair)?: A Lot Help needed to walk in [name] room?: Total Help needed climbing 3-5 steps with a railing? : Total 6 Click Score: 11    End of Session Equipment Utilized During Treatment: Gait belt Activity Tolerance: Patient limited by fatigue Patient left: with call bell/phone within reach;in chair;with chair alarm set Nurse Communication: Mobility status;Other (comment);Precautions (try stand pivot to [facility] for toileting, pt too fatigued to walk to bathroom today. Purewick donned in bed/chair due to lethargy) PT Visit Diagnosis: Unsteadiness on feet (R26.81);Repeated falls (R29.6)     Time: 8299-3716 PT Time Calculation (min) (ACUTE ONLY): 30 min  Charges:  $Therapeutic Exercise: 8-22 mins $Therapeutic Activity: 8-22 mins                     Rayana Geurin P., PTA Acute Rehabilitation Services Pager: [phone] Office: [place] 03/18/2021, 2:29 PM

## 2021-03-18 NOTE — Progress Notes (Signed)
[facility] PROGRESS NOTE    Progress Note  Amanda [name]  CHY:[id number] DOB: 21-Jun-1930 DOA: 03/11/2021 PCP: [name], DO     Brief Narrative:   Amanda [name] is an 87 y.o. female past medical history significant for prior stroke, dyslipidemia essential hypertension advanced dementia comes into the ED after a significant fall with facial contusion and ecchymosis.  In the ED CT of the head without contrast was done that showed acute bifrontal hemorrhage contusion and small volume subarachnoid hemorrhage.  Seen by neurosurgery in consultation, recommended conservative management and Keppra X 7 days, repeat head CT was stable. [facility] course complicated by delirium -Now remained stable Awaiting insurance authorization Assessment/Plan:   Traumatic intracranial hemorrhage: -Seen by neurosurgery in consultation, recommended conservative management and Keppra X 7 days -Aspirin was discontinued  -Repeat CT head 10/7 stable  -PT OT eval completed, SNF recommended  -Discontinue Keppra today, has completed 7 days -TOC following, plan for SNF when bed available  Advance dementia/frequent falls: Patient lives with his son, history of frequent falls and cognitive deficits Now plan for SNF  Essential hypertension: -On a regimen of amlodipine, Coreg and verapamil, blood pressures are stable -Will taper off amlodipine  History of prior CVAs: Holding aspirin due to [place] continue Lipitor.  Ethics/goals of care: Patient is a DNR.  Chronic diastolic heart failure : with an EF of 70% and grade 1 diastolic heart failure -Clinically appears euvolemic, continue Coreg, diuretics on hold  DVT prophylaxis: scd CODE STATUS: DNR Family Communication: No family at bedside Status is: Inpatient Dispo: The patient is from: Home              Anticipated d/c is to: SNF              Patient currently is medically stable to d/c.   Difficult to place patient No   Subjective:    Amanda T  [name] : Feels okay, denies any specific complaints Objective:    Vitals:   03/17/21 2331 03/18/21 0436 03/18/21 0745 03/18/21 1120  BP: 129/66 134/80 132/66 108/67  Pulse: 60 67 64 60  Resp: 18 17 14 14   Temp: 98.6 F (37 C) 97.6 F (36.4 C) 98.1 F (36.7 C) 98.1 F (36.7 C)  TempSrc: Oral Oral Oral Oral  SpO2: 96% 96% 93% 97%  Weight:      Height:       SpO2: 97 %   Intake/Output Summary (Last 24 hours) at 03/18/2021 1344 Last data filed at 03/18/2021 0800 Gross per 24 hour  Intake 543 ml  Output 1025 ml  Net -482 ml   Filed Weights   03/11/21 0737  Weight: 60 kg    Exam: General exam: Elderly awake alert oriented to self and place, mild cognitive deficits noted HEENT: Bruises noted CVS: S1-S2, regular rate rhythm Lungs: Clear bilaterally Abdomen: Soft, nontender, bowel sounds present  Extremities: No edema, increased stiffness in upper and lower extremities Skin: No rashes on exposed skin Psych: Poor insight and judgment  Data Reviewed:    Labs: Basic Metabolic Panel: Recent Labs  Lab 03/12/21 0248  NA 142  K 3.7  CL 109  CO2 24  GLUCOSE 96  BUN 11  CREATININE 0.93  CALCIUM 10.3   GFR Estimated Creatinine Clearance: 34.5 mL/min (by C-G formula based on SCr of 0.93 mg/dL). Liver Function Tests: No results for input(s): AST, ALT, ALKPHOS, BILITOT, PROT, ALBUMIN in the last 168 hours. No results for input(s): LIPASE, AMYLASE in  the last 168 hours. No results for input(s): AMMONIA in the last 168 hours. Coagulation profile No results for input(s): INR, PROTIME in the last 168 hours. COVID-19 Labs  No results for input(s): DDIMER, FERRITIN, LDH, CRP in the last 72 hours.  Lab Results  Component Value Date   SARSCOV2NAA NEGATIVE 03/11/2021   [place] NEGATIVE 12/31/2020   [place] NEGATIVE 04/17/2019    CBC: Recent Labs  Lab 03/12/21 0248  WBC 8.4  HGB 11.7*  HCT 36.0  MCV 96.3  PLT 175   Cardiac Enzymes: No results for  input(s): CKTOTAL, CKMB, CKMBINDEX, TROPONINI in the last 168 hours. BNP (last 3 results) No results for input(s): PROBNP in the last 8760 hours. CBG: Recent Labs  Lab 03/15/21 1702 03/16/21 0041 03/16/21 0609 03/16/21 1209 03/16/21 1621  GLUCAP 144* 110* 106* 121* 103*   D-Dimer: No results for input(s): DDIMER in the last 72 hours. Hgb A1c: No results for input(s): HGBA1C in the last 72 hours. Lipid Profile: No results for input(s): CHOL, HDL, LDLCALC, TRIG, CHOLHDL, LDLDIRECT in the last 72 hours. Thyroid function studies: No results for input(s): TSH, T4TOTAL, T3FREE, THYROIDAB in the last 72 hours.  Invalid input(s): FREET3 Anemia work up: No results for input(s): VITAMINB12, FOLATE, FERRITIN, TIBC, IRON, RETICCTPCT in the last 72 hours. Sepsis Labs: Recent Labs  Lab 03/12/21 0248  WBC 8.4   Microbiology Recent Results (from the past 240 hour(s))  Resp Panel by RT-PCR (Flu A&B, Covid) Nasopharyngeal Swab     Status: None   Collection Time: 03/11/21 12:33 PM   Specimen: Nasopharyngeal Swab; Nasopharyngeal(NP) swabs in vial transport medium  Result Value Ref Range Status   SARS Coronavirus 2 by RT PCR NEGATIVE NEGATIVE Final    Comment: (NOTE) SARS-CoV-2 target nucleic acids are NOT DETECTED.  The SARS-CoV-2 RNA is generally detectable in upper respiratory specimens during the acute phase of infection. The lowest concentration of SARS-CoV-2 viral copies this assay can detect is 138 copies/mL. A negative result does not preclude SARS-Cov-2 infection and should not be used as the sole basis for treatment or other patient management decisions. A negative result may occur with  improper specimen collection/handling, submission of specimen other than nasopharyngeal swab, presence of viral mutation(s) within the areas targeted by this assay, and inadequate number of viral copies(<138 copies/mL). A negative result must be combined with clinical observations, patient  history, and epidemiological information. The expected result is Negative.  Fact Sheet for Patients:  EntrepreneurPulse.com.au  Fact Sheet for Healthcare Providers:  IncredibleEmployment.be  This test is no t yet approved or cleared by the [place] FDA and  has been authorized for detection and/or diagnosis of SARS-CoV-2 by FDA under an Emergency Use Authorization (EUA). This EUA will remain  in effect (meaning this test can be used) for the duration of the COVID-19 declaration under Section 564(b)(1) of the Act, 21 U.S.C.section 360bbb-3(b)(1), unless the authorization is terminated  or revoked sooner.       Influenza A by PCR NEGATIVE NEGATIVE Final   Influenza B by PCR NEGATIVE NEGATIVE Final    Comment: (NOTE) The Xpert Xpress SARS-CoV-2/FLU/RSV plus assay is intended as an aid in the diagnosis of influenza from Nasopharyngeal swab specimens and should not be used as a sole basis for treatment. Nasal washings and aspirates are unacceptable for Xpert Xpress SARS-CoV-2/FLU/RSV testing.  Fact Sheet for Patients: EntrepreneurPulse.com.au  Fact Sheet for Healthcare Providers: IncredibleEmployment.be  This test is not yet approved or cleared by the [place]  FDA and has been authorized for detection and/or diagnosis of SARS-CoV-2 by FDA under an Emergency Use Authorization (EUA). This EUA will remain in effect (meaning this test can be used) for the duration of the COVID-19 declaration under Section 564(b)(1) of the Act, 21 U.S.C. section 360bbb-3(b)(1), unless the authorization is terminated or revoked.  Performed at [facility], [address], [place], [place] [address]      Medications:    amLODipine  5 mg Oral Daily   atorvastatin  20 mg Oral QHS   carvedilol  3.125 mg Oral BID WC   darifenacin  7.5 mg Oral Daily   iron polysaccharides  150 mg Oral Daily   pantoprazole  40 mg Oral  Daily   sodium chloride flush  3 mL Intravenous Q12H   sodium chloride flush  3 mL Intravenous Q12H   verapamil  180 mg Oral QHS   Continuous Infusions:  sodium chloride 250 mL (03/11/21 2033)      LOS: 6 days   Domenic Polite  [facility]  03/18/2021, 1:44 PM

## 2021-03-18 NOTE — Progress Notes (Signed)
Occupational Therapy Treatment Patient Details Name: Amanda [name] MRN: [id number] DOB: 01-Oct-1930 Today's Date: 03/18/2021   History of present illness This 87 y.o. female admitted after a significant fall with facial contusion. CT of head showed acute bifrontal hemorrhage, and small volume SAH.   PMH includes: Dementia, CAD, HTN, CVA.   OT comments  Pt drowsy this date.  She requires set up for self feeding this date and mod - max A for ADL tasks.   Continue to recommend SNF.   Recommendations for follow up therapy are one component of a multi-disciplinary discharge planning process, led by the attending physician.  Recommendations may be updated based on patient status, additional functional criteria and insurance authorization.    Follow Up Recommendations  SNF;Supervision/Assistance - 24 hour    Equipment Recommendations  Tub/shower bench    Recommendations for Other Services      Precautions / Restrictions Precautions Precautions: Fall Precaution Comments: h/o falls per chart       Mobility Bed Mobility                    Transfers                      Balance Overall balance assessment: Needs assistance Sitting-balance support: Feet supported Sitting balance-Leahy Scale: Poor Sitting balance - Comments: close min guard assist for static sitting in recliner                                   ADL either performed or assessed with clinical judgement   ADL Overall ADL's : Needs assistance/impaired Eating/Feeding: Set up;Sitting Eating/Feeding Details (indicate cue type and reason): pt required assist to cut up her food adequately and to properly season her food                                 Functional mobility during ADLs: Moderate assistance       Vision       Perception     Praxis      Cognition Arousal/Alertness:  (drowsy) Behavior During Therapy: Impulsive Overall Cognitive Status: Impaired/Different  from baseline Area of Impairment: Orientation;Memory;Attention;Following commands;Safety/judgement;Awareness;Problem solving                 Orientation Level: Disoriented to;Time;Situation;Place Current Attention Level: Sustained Memory: Decreased short-term memory;Decreased recall of precautions Following Commands: Follows one step commands consistently;Follows one step commands with increased time Safety/Judgement: Decreased awareness of safety;Decreased awareness of deficits   Problem Solving: Difficulty sequencing;Requires verbal cues;Requires tactile cues;Slow processing;Decreased initiation          Exercises     Shoulder Instructions       General Comments      Pertinent Vitals/ Pain       Pain Assessment: Faces Faces Pain Scale: Hurts a little bit Pain Location: generalized, pt unable to localize 2/2 cognition but bruising noted to L side of body (hip/arm/face) Pain Descriptors / Indicators: Grimacing  Home Living                                          Prior Functioning/Environment              Frequency  Min 2X/week  Progress Toward Goals  OT Goals(current goals can now be found in the care plan section)  Progress towards OT goals: Not progressing toward goals - comment (more lethargic this date)     Plan Discharge plan remains appropriate    Co-evaluation                 AM-PAC OT "6 Clicks" Daily Activity     Outcome Measure   Help from another person eating meals?: A Little Help from another person taking care of personal grooming?: A Lot Help from another person toileting, which includes using toliet, bedpan, or urinal?: A Lot Help from another person bathing (including washing, rinsing, drying)?: A Lot Help from another person to put on and taking off regular upper body clothing?: A Lot Help from another person to put on and taking off regular lower body clothing?: A Lot 6 Click Score: 13    End  of Session    OT Visit Diagnosis: Unsteadiness on feet (R26.81);Cognitive communication deficit (R41.841);Repeated falls (R29.6)   Activity Tolerance Patient tolerated treatment well   Patient Left in chair;with call bell/phone within reach;with chair alarm set   Nurse Communication Mobility status        Time: 6378-5885 OT Time Calculation (min): 12 min  Charges: OT General Charges $OT Visit: 1 Visit OT Treatments $Self Care/Home Management : 8-22 mins  Nilsa Nutting OTR/L Acute Rehabilitation Services Pager [phone] Office [phone]   Lucille Passy M 03/18/2021, 5:12 PM

## 2021-03-18 NOTE — TOC Progression Note (Signed)
Transition of Care [facility]) - Progression Note    Patient Details  Name: Amanda [name] MRN: [id number] Date of Birth: 11-30-1930  Transition of Care [facility]) CM/SW [place], [place] Phone Number: 03/18/2021, 10:36 AM  Clinical Narrative:   CSW left a voicemail for [employer] to check on status of insurance authorization request. Awaiting call back.    Expected Discharge Plan: [place] Barriers to Discharge: Continued Medical Work up, Ship broker  Expected Discharge Plan and Services Expected Discharge Plan: [place] Choice: [place] arrangements for the past 2 months: Single Family Home                                       Social Determinants of Health (SDOH) Interventions    Readmission Risk Interventions No flowsheet data found.

## 2021-03-19 ENCOUNTER — Ambulatory Visit: Payer: Medicare HMO | Admitting: Urology

## 2021-03-19 DIAGNOSIS — S066XAD Traumatic subarachnoid hemorrhage with loss of consciousness status unknown, subsequent encounter: Secondary | ICD-10-CM | POA: Diagnosis not present

## 2021-03-19 DIAGNOSIS — G319 Degenerative disease of nervous system, unspecified: Secondary | ICD-10-CM | POA: Diagnosis not present

## 2021-03-19 DIAGNOSIS — N3281 Overactive bladder: Secondary | ICD-10-CM

## 2021-03-19 DIAGNOSIS — Z8673 Personal history of transient ischemic attack (TIA), and cerebral infarction without residual deficits: Secondary | ICD-10-CM | POA: Diagnosis not present

## 2021-03-19 DIAGNOSIS — N3021 Other chronic cystitis with hematuria: Secondary | ICD-10-CM

## 2021-03-19 NOTE — TOC Progression Note (Signed)
Transition of Care [facility]) - Progression Note    Patient Details  Name: Amanda [name] MRN: [id number] Date of Birth: 26-Mar-1931  Transition of Care [facility]) CM/SW [place], [place] Phone Number: 03/19/2021, 4:38 PM  Clinical Narrative:   CSW received call from [facility] that [employer] has denied SNF placement, indicating no medical need for SNF. CSW obtained appeal number to call. CSW spoke with both son and daughter to discuss denial, and both are in agreement with pursuing appeal. CSW contacted [place] [employer] to initiate appeal and faxed appeal and supporting evidence to [facility] for review. CSW to await decision.     Expected Discharge Plan: [place] Barriers to Discharge: Continued Medical Work up, Ship broker  Expected Discharge Plan and Services Expected Discharge Plan: [place] Choice: [place] arrangements for the past 2 months: Single Family Home                                       Social Determinants of Health (SDOH) Interventions    Readmission Risk Interventions No flowsheet data found.

## 2021-03-19 NOTE — Progress Notes (Signed)
[facility] PROGRESS NOTE    Progress Note  Amanda [name]  WUJ:[id number] DOB: Jan 09, 1931 DOA: 03/11/2021 PCP: [name], DO     Brief Narrative:   Amanda [name] is an 87 y.o. female past medical history significant for prior stroke, dyslipidemia essential hypertension advanced dementia comes into the ED after a significant fall with facial contusion and ecchymosis.  In the ED CT of the head without contrast was done that showed acute bifrontal hemorrhage contusion and small volume subarachnoid hemorrhage.  Seen by neurosurgery in consultation, recommended conservative management and Keppra X 7 days, repeat head CT was stable. [facility] course complicated by delirium -Now remained stable Awaiting insurance authorization Assessment/Plan:   Traumatic intracranial hemorrhage: -Seen by neurosurgery in consultation, recommended conservative management and Keppra X 7 days -Aspirin was discontinued  -Repeat CT head 10/7 stable  -PT OT eval completed, SNF recommended  -Discontinued Keppra -completed 7 days -Remains stable with baseline cognitive deficits, discharge planning, TOC following, plan for SNF  Advance dementia/frequent falls: Patient lives with his son, history of frequent falls and cognitive deficits Now plan for SNF  Essential hypertension: -On a regimen of amlodipine, Coreg and verapamil, blood pressures are stable -Will taper off amlodipine  History of prior CVAs: Holding aspirin due to [place] continue Lipitor.  Ethics/goals of care: Patient is a DNR.  Chronic diastolic heart failure : with an EF of 70% and grade 1 diastolic heart failure -Clinically appears euvolemic, continue Coreg, diuretics on hold  DVT prophylaxis: scd CODE STATUS: DNR Family Communication: No family at bedside, attempted to call patient's son Ulice Dash, Voicemail not activated Status is: Inpatient Dispo: The patient is from: Home              Anticipated d/c is to: SNF               Patient currently is medically stable to d/c.   Difficult to place patient No   Subjective:    Amanda [name] : Feels okay, denies any specific complaints Objective:    Vitals:   03/19/21 0054 03/19/21 0411 03/19/21 0758 03/19/21 1154  BP: 112/63 129/64 137/70 119/61  Pulse: 64 63 60 62  Resp: 17 18 18 20   Temp: 97.8 F (36.6 C) 98 F (36.7 C) 97.6 F (36.4 C) 97.6 F (36.4 C)  TempSrc: Oral Oral Oral Oral  SpO2: 94% 97% 98% 96%  Weight:      Height:       SpO2: 96 %   Intake/Output Summary (Last 24 hours) at 03/19/2021 1220 Last data filed at 03/19/2021 0500 Gross per 24 hour  Intake 278 ml  Output 500 ml  Net -222 ml   Filed Weights   03/11/21 0737  Weight: 60 kg    Exam: General exam: Elderly awake alert oriented to self and place, mild cognitive deficits noted HEENT: Bruises noted CVS: S1-S2, regular rate rhythm Lungs: Clear bilaterally Abdomen: Soft, nontender, bowel sounds present  Extremities: No edema, increased stiffness in upper and lower extremities Skin: No rashes on exposed skin Psych: Poor insight and judgment  Data Reviewed:    Labs: Basic Metabolic Panel: No results for input(s): NA, K, CL, CO2, GLUCOSE, BUN, CREATININE, CALCIUM, MG, PHOS in the last 168 hours.  GFR Estimated Creatinine Clearance: 34.5 mL/min (by C-G formula based on SCr of 0.93 mg/dL). Liver Function Tests: No results for input(s): AST, ALT, ALKPHOS, BILITOT, PROT, ALBUMIN in the last 168 hours. No results for input(s): LIPASE, AMYLASE in  the last 168 hours. No results for input(s): AMMONIA in the last 168 hours. Coagulation profile No results for input(s): INR, PROTIME in the last 168 hours. COVID-19 Labs  No results for input(s): DDIMER, FERRITIN, LDH, CRP in the last 72 hours.  Lab Results  Component Value Date   SARSCOV2NAA NEGATIVE 03/11/2021   [place] NEGATIVE 12/31/2020   [place] NEGATIVE 04/17/2019    CBC: No results for input(s): WBC,  NEUTROABS, HGB, HCT, MCV, PLT in the last 168 hours.  Cardiac Enzymes: No results for input(s): CKTOTAL, CKMB, CKMBINDEX, TROPONINI in the last 168 hours. BNP (last 3 results) No results for input(s): PROBNP in the last 8760 hours. CBG: Recent Labs  Lab 03/15/21 1702 03/16/21 0041 03/16/21 0609 03/16/21 1209 03/16/21 1621  GLUCAP 144* 110* 106* 121* 103*   D-Dimer: No results for input(s): DDIMER in the last 72 hours. Hgb A1c: No results for input(s): HGBA1C in the last 72 hours. Lipid Profile: No results for input(s): CHOL, HDL, LDLCALC, TRIG, CHOLHDL, LDLDIRECT in the last 72 hours. Thyroid function studies: No results for input(s): TSH, T4TOTAL, T3FREE, THYROIDAB in the last 72 hours.  Invalid input(s): FREET3 Anemia work up: No results for input(s): VITAMINB12, FOLATE, FERRITIN, TIBC, IRON, RETICCTPCT in the last 72 hours. Sepsis Labs: No results for input(s): PROCALCITON, WBC, LATICACIDVEN in the last 168 hours.  Microbiology Recent Results (from the past 240 hour(s))  Resp Panel by RT-PCR (Flu A&B, Covid) Nasopharyngeal Swab     Status: None   Collection Time: 03/11/21 12:33 PM   Specimen: Nasopharyngeal Swab; Nasopharyngeal(NP) swabs in vial transport medium  Result Value Ref Range Status   SARS Coronavirus 2 by RT PCR NEGATIVE NEGATIVE Final    Comment: (NOTE) SARS-CoV-2 target nucleic acids are NOT DETECTED.  The SARS-CoV-2 RNA is generally detectable in upper respiratory specimens during the acute phase of infection. The lowest concentration of SARS-CoV-2 viral copies this assay can detect is 138 copies/mL. A negative result does not preclude SARS-Cov-2 infection and should not be used as the sole basis for treatment or other patient management decisions. A negative result may occur with  improper specimen collection/handling, submission of specimen other than nasopharyngeal swab, presence of viral mutation(s) within the areas targeted by this assay, and  inadequate number of viral copies(<138 copies/mL). A negative result must be combined with clinical observations, patient history, and epidemiological information. The expected result is Negative.  Fact Sheet for Patients:  EntrepreneurPulse.com.au  Fact Sheet for Healthcare Providers:  IncredibleEmployment.be  This test is no t yet approved or cleared by the [place] FDA and  has been authorized for detection and/or diagnosis of SARS-CoV-2 by FDA under an Emergency Use Authorization (EUA). This EUA will remain  in effect (meaning this test can be used) for the duration of the COVID-19 declaration under Section 564(b)(1) of the Act, 21 U.S.C.section 360bbb-3(b)(1), unless the authorization is terminated  or revoked sooner.       Influenza A by PCR NEGATIVE NEGATIVE Final   Influenza B by PCR NEGATIVE NEGATIVE Final    Comment: (NOTE) The Xpert Xpress SARS-CoV-2/FLU/RSV plus assay is intended as an aid in the diagnosis of influenza from Nasopharyngeal swab specimens and should not be used as a sole basis for treatment. Nasal washings and aspirates are unacceptable for Xpert Xpress SARS-CoV-2/FLU/RSV testing.  Fact Sheet for Patients: EntrepreneurPulse.com.au  Fact Sheet for Healthcare Providers: IncredibleEmployment.be  This test is not yet approved or cleared by the [place] and has been authorized  for detection and/or diagnosis of SARS-CoV-2 by FDA under an Emergency Use Authorization (EUA). This EUA will remain in effect (meaning this test can be used) for the duration of the COVID-19 declaration under Section 564(b)(1) of the Act, 21 U.S.C. section 360bbb-3(b)(1), unless the authorization is terminated or revoked.  Performed at [facility], [address] [place], [place] [address]      Medications:    atorvastatin  20 mg Oral QHS   carvedilol  3.125 mg Oral BID WC    darifenacin  7.5 mg Oral Daily   iron polysaccharides  150 mg Oral Daily   pantoprazole  40 mg Oral Daily   sodium chloride flush  3 mL Intravenous Q12H   sodium chloride flush  3 mL Intravenous Q12H   verapamil  180 mg Oral QHS   Continuous Infusions:  sodium chloride 250 mL (03/11/21 2033)      LOS: 7 days   Domenic Polite  [facility]  03/19/2021, 12:20 PM

## 2021-03-19 NOTE — TOC Progression Note (Signed)
Transition of Care [facility]) - Progression Note    Patient Details  Name: Amanda [name] MRN: [id number] Date of Birth: [birth year]  Transition of Care [facility]) CM/SW [place], [place] Phone Number: 03/19/2021, 10:25 AM  Clinical Narrative:   CSW received call from [facility] that [employer] has denied SNF. CSW asked for information about appealing denial, and awaiting call back on how to appeal. CSW updated MD.    Expected Discharge Plan: [place] Barriers to Discharge: Continued Medical Work up, Ship broker  Expected Discharge Plan and Services Expected Discharge Plan: [place] Choice: [place] arrangements for the past 2 months: Single Family Home                                       Social Determinants of Health (SDOH) Interventions    Readmission Risk Interventions No flowsheet data found.

## 2021-03-20 NOTE — Plan of Care (Signed)
  Problem: Education: Goal: Knowledge of General Education information will improve Description: Including pain rating scale, medication(s)/side effects and non-pharmacologic comfort measures Outcome: Progressing   Problem: Health Behavior/Discharge Planning: Goal: Ability to manage health-related needs will improve Outcome: Progressing   Problem: Clinical Measurements: Goal: Ability to maintain clinical measurements within normal limits will improve Outcome: Progressing   Problem: Clinical Measurements: Goal: Will remain free from infection Outcome: Progressing   Problem: Activity: Goal: Risk for activity intolerance will decrease Outcome: Progressing

## 2021-03-20 NOTE — Progress Notes (Signed)
patient seen and examined, laying in bed resting comfortably, with moderate cognitive deficits. -Remains stable -Awaiting SNF  Domenic Polite, MD

## 2021-03-21 LAB — GLUCOSE, CAPILLARY: Glucose-Capillary: 103 mg/dL — ABNORMAL HIGH (ref 70–99)

## 2021-03-21 MED ORDER — ENSURE ENLIVE PO LIQD
237.0000 mL | Freq: Two times a day (BID) | ORAL | Status: DC
  Administered 2021-03-21 – 2021-03-29 (×14): 237 mL via ORAL

## 2021-03-21 NOTE — Progress Notes (Signed)
Patient seen and examined, laying comfortably in bed eating breakfast, continues to have moderate cognitive deficits. -Remains stable at this time, awaiting SNF   Domenic Polite, MD

## 2021-03-22 DIAGNOSIS — Z8673 Personal history of transient ischemic attack (TIA), and cerebral infarction without residual deficits: Secondary | ICD-10-CM | POA: Diagnosis not present

## 2021-03-22 DIAGNOSIS — S066XAD Traumatic subarachnoid hemorrhage with loss of consciousness status unknown, subsequent encounter: Secondary | ICD-10-CM | POA: Diagnosis not present

## 2021-03-22 DIAGNOSIS — G319 Degenerative disease of nervous system, unspecified: Secondary | ICD-10-CM | POA: Diagnosis not present

## 2021-03-22 NOTE — Progress Notes (Addendum)
[facility] PROGRESS NOTE    Progress Note  Amanda [name]  CBJ:[id number] DOB: 1930-12-25 DOA: 03/11/2021 PCP: [name], DO     Brief Narrative:   Amanda [name] is an 87 y.o. female past medical history significant for prior stroke, dyslipidemia essential hypertension advanced dementia comes into the ED after a significant fall with facial contusion and ecchymosis.  In the ED CT of the head without contrast was done that showed acute bifrontal hemorrhage contusion and small volume subarachnoid hemorrhage.  Seen by neurosurgery in consultation, recommended conservative management and Keppra X 7 days, repeat head CT was stable. [facility] course complicated by delirium -Now remains stable  Awaiting insurance authorization for SNF Assessment/Plan:   Traumatic intracranial hemorrhage: -Seen by neurosurgery in consultation, recommended conservative management and Keppra X 7 days -Aspirin was discontinued  -Repeat CT head 10/7 stable  -PT OT eval completed, SNF recommended  -Discontinued Keppra -completed 7 days -Remains stable with baseline cognitive deficits -Discharge planning, TOC following, plan for SNF  Advance dementia/frequent falls: Patient lives with his son, history of frequent falls and cognitive deficits Now plan for SNF  Essential hypertension: -On a regimen of amlodipine, Coreg and verapamil, blood pressures are stable -Tapered off amlodipine  History of prior CVAs: Holding aspirin due to [place] continue Lipitor.  Ethics/goals of care: Patient is a DNR.  Chronic diastolic heart failure : with an EF of 70% and grade 1 diastolic heart failure -Clinically appears euvolemic, continue Coreg, diuretics on hold  DVT prophylaxis: SCDs CODE STATUS: DNR Family Communication: No family at bedside, was unable to reach patient's son  Status is: Inpatient Dispo: The patient is from: Home              Anticipated d/c is to: SNF              Patient currently is  medically stable to d/c.   Difficult to place patient No   Subjective:    Amanda [name] denies any specific complaints this morning, sitting up eating breakfast Objective:    Vitals:   03/21/21 2033 03/22/21 0417 03/22/21 0749 03/22/21 1136  BP: 122/72 117/65 128/65 (!) 141/79  Pulse: 81 61 64 68  Resp: 20 17 18    Temp: 98.4 F (36.9 C) 98 F (36.7 C) 97.7 F (36.5 C) 98 F (36.7 C)  TempSrc: Oral Oral Oral Oral  SpO2: 100% 97% 95% 94%  Weight:      Height:       SpO2: 94 %  No intake or output data in the 24 hours ending 03/22/21 1219  Filed Weights   03/11/21 0737  Weight: 60 kg    Exam: General exam: Pleasant elderly female sitting up in bed, mild cognitive deficits noted HEENT: Bruise noted CVS: S1-S2, regular rate rhythm Lungs: Clear bilaterally Abdomen: Soft, nontender, bowel sounds present Extremities: No edema  Skin: No rashes on exposed skin Psych: Poor insight and judgment  Data Reviewed:    Labs: Basic Metabolic Panel: No results for input(s): NA, K, CL, CO2, GLUCOSE, BUN, CREATININE, CALCIUM, MG, PHOS in the last 168 hours.  GFR Estimated Creatinine Clearance: 34.5 mL/min (by C-G formula based on SCr of 0.93 mg/dL). Liver Function Tests: No results for input(s): AST, ALT, ALKPHOS, BILITOT, PROT, ALBUMIN in the last 168 hours. No results for input(s): LIPASE, AMYLASE in the last 168 hours. No results for input(s): AMMONIA in the last 168 hours. Coagulation profile No results for input(s): INR, PROTIME in the  last 168 hours. COVID-19 Labs  No results for input(s): DDIMER, FERRITIN, LDH, CRP in the last 72 hours.  Lab Results  Component Value Date   SARSCOV2NAA NEGATIVE 03/11/2021   [place] NEGATIVE 12/31/2020   [place] NEGATIVE 04/17/2019    CBC: No results for input(s): WBC, NEUTROABS, HGB, HCT, MCV, PLT in the last 168 hours.  Cardiac Enzymes: No results for input(s): CKTOTAL, CKMB, CKMBINDEX, TROPONINI in the last 168  hours. BNP (last 3 results) No results for input(s): PROBNP in the last 8760 hours. CBG: Recent Labs  Lab 03/16/21 0041 03/16/21 0609 03/16/21 1209 03/16/21 1621 03/21/21 0805  GLUCAP 110* 106* 121* 103* 103*   D-Dimer: No results for input(s): DDIMER in the last 72 hours. Hgb A1c: No results for input(s): HGBA1C in the last 72 hours. Lipid Profile: No results for input(s): CHOL, HDL, LDLCALC, TRIG, CHOLHDL, LDLDIRECT in the last 72 hours. Thyroid function studies: No results for input(s): TSH, T4TOTAL, T3FREE, THYROIDAB in the last 72 hours.  Invalid input(s): FREET3 Anemia work up: No results for input(s): VITAMINB12, FOLATE, FERRITIN, TIBC, IRON, RETICCTPCT in the last 72 hours. Sepsis Labs: No results for input(s): PROCALCITON, WBC, LATICACIDVEN in the last 168 hours.  Microbiology No results found for this or any previous visit (from the past 240 hour(s)).    Medications:    atorvastatin  20 mg Oral QHS   carvedilol  3.125 mg Oral BID WC   darifenacin  7.5 mg Oral Daily   feeding supplement  237 mL Oral BID BM   iron polysaccharides  150 mg Oral Daily   pantoprazole  40 mg Oral Daily   sodium chloride flush  3 mL Intravenous Q12H   sodium chloride flush  3 mL Intravenous Q12H   verapamil  180 mg Oral QHS   Continuous Infusions:  sodium chloride 250 mL (03/11/21 2033)      LOS: 10 days   Domenic Polite  [facility]  03/22/2021, 12:19 PM

## 2021-03-22 NOTE — Progress Notes (Addendum)
Physical Therapy Treatment Patient Details Name: Amanda [name] MRN: [id number] DOB: 08/15/1930 Today's Date: 03/22/2021   History of Present Illness This 87 y.o. female admitted after a significant fall with facial contusion. CT of head showed acute bifrontal hemorrhage, and small volume SAH.   PMH includes: Dementia, CAD, HTN, CVA.    PT Comments    Pt received in supine, drowsy and participatory with encouragement. Pt following around 50% of simple 1-step cues, she remains impulsive/not oriented to situation or location and needing increased assist today to perform bed mobility and transfers. Pt limited due to bowel/bladder incontinence (although pt unable to indicate this to therapist) and deferred transfer to chair due to pt needing hygiene assist, nursing staff notified. Pt continues to benefit from PT services to progress toward functional mobility goals and she remains well below functional baseline and has been non-ambulatory past few days, continue to recommend SNF.    Recommendations for follow up therapy are one component of a multi-disciplinary discharge planning process, led by the attending physician.  Recommendations may be updated based on patient status, additional functional criteria and insurance authorization.  Follow Up Recommendations  SNF;Supervision/Assistance - 24 hour     Equipment Recommendations  Other (comment) (TBD by next venue of care, likely will need wheelchair and cushion)    Recommendations for Other Services       Precautions / Restrictions Precautions Precautions: Fall Precaution Comments: h/o falls per chart Restrictions Weight Bearing Restrictions: No     Mobility  Bed Mobility Overal bed mobility: Needs Assistance Bed Mobility: Rolling;Sidelying to Sit;Sit to Supine Rolling: Mod assist Sidelying to sit: Max assist;HOB elevated   Sit to supine: Max assist   General bed mobility comments: trunk and BLE assist needed due to drowsiness and  cognitive deficit, multimodal cues to perform    Transfers Overall transfer level: Needs assistance Equipment used: Rolling walker (2 wheeled) Transfers: Sit to/from Stand Sit to Stand: Mod assist         General transfer comment: cues for hand placement and modA to achieve upright multiple trials from EOB to RW, poor carryover of hand placement cues; Defer stand pivot transfers as pt impulsive to sit EOB each time after taking 1-2 sidesteps then noted bowel incontinence in briefs, NT notified, pt returned to bed.  Ambulation/Gait             General Gait Details: pt quick to fatigue and lethargic unable to follow instructions for gait progression and sits impulsively, c/o L sided pain/discomfort, RN notified.   Stairs             Wheelchair Mobility    Modified Rankin (Stroke Patients Only) Modified Rankin (Stroke Patients Only) Pre-Morbid Rankin Score: Moderate disability Modified Rankin: Moderately severe disability     Balance Overall balance assessment: Needs assistance Sitting-balance support: Feet supported Sitting balance-Leahy Scale: Poor Sitting balance - Comments: close min guard assist for static sitting at EOB Postural control: Posterior lean Standing balance support: Bilateral upper extremity supported Standing balance-Leahy Scale: Poor Standing balance comment: mild posterior lean in stance at RW                            Cognition Arousal/Alertness: Lethargic Behavior During Therapy: Impulsive Overall Cognitive Status: Impaired/Different from baseline Area of Impairment: Orientation;Memory;Attention;Following commands;Safety/judgement;Awareness;Problem solving                 Orientation Level: Disoriented to;Time;Situation;Place Current Attention Level: Focused  Memory: Decreased short-term memory;Decreased recall of precautions Following Commands: Follows one step commands with increased time;Follows one step commands  inconsistently Safety/Judgement: Decreased awareness of safety;Decreased awareness of deficits Awareness: Intellectual Problem Solving: Difficulty sequencing;Requires verbal cues;Requires tactile cues;Slow processing;Decreased initiation General Comments: Pt drowsy this date, following 40-50% of simple 1-step commands with increased time and c/o more of pain and internally distracted, limiting pt participation.      Exercises Other Exercises Other Exercises: standing hip flexion x5 reps ea leg (reps limited due to pt impulsivity/fatigue) Other Exercises: seated BLE AROM: hip flexion, LAQ x10 reps ea Other Exercises: STS x 3 reps    General Comments General comments (skin integrity, edema, etc.): no acute s/sx distress other than moderate L sided pain based on pt guarding/discomfort and RN notified, as well as of bowel incontinence.      Pertinent Vitals/Pain Pain Assessment: Faces Faces Pain Scale: Hurts even more Pain Location: generalized, pt unable to localize 2/2 cognition but pt guarding more toward L side of body where bruising located from fall PTA Pain Descriptors / Indicators: Grimacing;Guarding;Discomfort;Moaning Pain Intervention(s): Limited activity within patient's tolerance;Monitored during session;Repositioned;Patient requesting pain meds-RN notified    Home Living                      Prior Function            PT Goals (current goals can now be found in the care plan section) Acute Rehab PT Goals Patient Stated Goal: pt unable to state -cognitive deficit PT Goal Formulation: Patient unable to participate in goal setting Time For Goal Achievement: 03/26/21 Potential to Achieve Goals: Fair Progress towards PT goals: Progressing toward goals (slow progress)    Frequency    Min 3X/week      PT Plan Current plan remains appropriate    Co-evaluation              AM-PAC PT "6 Clicks" Mobility   Outcome Measure  Help needed turning from your  back to your side while in a flat bed without using bedrails?: A Lot Help needed moving from lying on your back to sitting on the side of a flat bed without using bedrails?: A Lot Help needed moving to and from a bed to a chair (including a wheelchair)?: A Lot Help needed standing up from a chair using your arms (e.g., wheelchair or bedside chair)?: A Lot Help needed to walk in hospital room?: Total Help needed climbing 3-5 steps with a railing? : Total 6 Click Score: 10    End of Session Equipment Utilized During Treatment: Gait belt Activity Tolerance: Patient limited by lethargy Patient left: in bed;with call bell/phone within reach;with bed alarm set;Other (comment) (NT notified pt needs hygiene assist, left SCDs off until after bath) Nurse Communication: Mobility status;Other (comment) (pt incontinent, needs a bath) PT Visit Diagnosis: Unsteadiness on feet (R26.81);Repeated falls (R29.6)     Time: 3614-4315 PT Time Calculation (min) (ACUTE ONLY): 23 min  Charges:  $Therapeutic Exercise: 8-22 mins $Therapeutic Activity: 8-22 mins                     Dailyn Reith P., PTA Acute Rehabilitation Services Pager: [phone] Office: [place] 03/22/2021, 2:47 PM

## 2021-03-22 NOTE — TOC Progression Note (Signed)
Transition of Care [facility]) - Progression Note    Patient Details  Name: Amanda [name] MRN: [id number] Date of Birth: 10-Oct-1930  Transition of Care [facility]) CM/SW [place], [place] Phone Number: 03/22/2021, 2:56 PM  Clinical Narrative:   CSW continuing to await decision on appeal with [facility].     Expected Discharge Plan: [place] Barriers to Discharge: Continued Medical Work up, Ship broker  Expected Discharge Plan and Services Expected Discharge Plan: [place] Choice: [place] arrangements for the past 2 months: Single Family Home                                       Social Determinants of Health (SDOH) Interventions    Readmission Risk Interventions No flowsheet data found.

## 2021-03-22 NOTE — Consult Note (Signed)
   [facility] CM Inpatient Consult   03/22/2021  Tanith T Easton 15-Aug-1930 [id number]  [place] [facility] [ACO] Patient: Amanda [name]  Primary Care Provider:  [name], DO, [place], a [facility] Embedded provider is listed to follow for [facility] and appoitment   Patient screened for length of stay hospitalization with noted high risk score for unplanned readmission risk and noted patient is active in the Embedded Chronic Care Management program at [facility] with an Embedded CCM RN.  Review of patient's medical record reveals patient isbeing recommended for a skilled nursing facility level of care for disposition.  Plan:  Continue to follow progress and disposition to assess for post hospital care management needs. Will alert Embedded team of disposition and TOC needs.  For questions contact:   Natividad Brood, RN BSN [facility] Liaison  [phone] business mobile phone Toll free office [phone]  Fax number: [phone] [email or web address]

## 2021-03-23 DIAGNOSIS — S066XAD Traumatic subarachnoid hemorrhage with loss of consciousness status unknown, subsequent encounter: Secondary | ICD-10-CM | POA: Diagnosis not present

## 2021-03-23 LAB — BASIC METABOLIC PANEL
Anion gap: 10 (ref 5–15)
BUN: 44 mg/dL — ABNORMAL HIGH (ref 8–23)
CO2: 25 mmol/L (ref 22–32)
Calcium: 11.8 mg/dL — ABNORMAL HIGH (ref 8.9–10.3)
Chloride: 105 mmol/L (ref 98–111)
Creatinine, Ser: 1.22 mg/dL — ABNORMAL HIGH (ref 0.44–1.00)
GFR, Estimated: 43 mL/min — ABNORMAL LOW (ref >60)
Glucose, Bld: 109 mg/dL — ABNORMAL HIGH (ref 70–99)
Potassium: 3.9 mmol/L (ref 3.5–5.1)
Sodium: 140 mmol/L (ref 135–145)

## 2021-03-23 LAB — CBC
HCT: 36.1 % (ref 36.0–46.0)
Hemoglobin: 11.8 g/dL — ABNORMAL LOW (ref 12.0–15.0)
MCH: 30.9 pg (ref 26.0–34.0)
MCHC: 32.7 g/dL (ref 30.0–36.0)
MCV: 94.5 fL (ref 80.0–100.0)
Platelets: 149 10*3/uL — ABNORMAL LOW (ref 150–400)
RBC: 3.82 MIL/uL — ABNORMAL LOW (ref 3.87–5.11)
RDW: 12.6 % (ref 11.5–15.5)
WBC: 11.7 10*3/uL — ABNORMAL HIGH (ref 4.0–10.5)
nRBC: 0 % (ref 0.0–0.2)

## 2021-03-23 NOTE — Progress Notes (Signed)
[facility] PROGRESS NOTE    Progress Note  Amanda [name]  BEE:[id number] DOB: December 28, 1930 DOA: 03/11/2021 PCP: [name], DO     Brief Narrative:   Amanda [name] is an 87 y.o. female past medical history significant for prior stroke, dyslipidemia essential hypertension advanced dementia comes into the ED after a significant fall with facial contusion and ecchymosis.  In the ED CT of the head without contrast was done that showed acute bifrontal hemorrhage contusion and small volume subarachnoid hemorrhage.  Seen by neurosurgery in consultation, recommended conservative management and Keppra X 7 days, repeat head CT was stable. [facility] course complicated by delirium -Now remains stable  Awaiting insurance authorization for SNF Assessment/Plan:   Traumatic intracranial hemorrhage: -Seen by neurosurgery in consultation, recommended conservative management and Keppra X 7 days -Aspirin was discontinued  -Repeat CT head 10/7 stable  -PT OT eval completed, SNF recommended  -Discontinued Keppra -completed 7 days -Remains stable with baseline cognitive deficits -Discharge planning, TOC following, remains medically stable for SNF  Advance dementia/frequent falls: Patient lives with his son, history of frequent falls and cognitive deficits Now plan for SNF  Essential hypertension: -On a regimen of amlodipine, Coreg and verapamil, blood pressures are stable -Tapered off amlodipine  History of prior CVAs: Holding aspirin due to [place] continue Lipitor.  Ethics/goals of care: Patient is a DNR.  Chronic diastolic heart failure : with an EF of 70% and grade 1 diastolic heart failure -Clinically appears euvolemic, continue Coreg, diuretics on hold  DVT prophylaxis: SCDs CODE STATUS: DNR Family Communication: No family at bedside, was unable to reach patient's son Amanda [name] today and on Friday VM not activated Status is: Inpatient Dispo: The patient is from: Home               Anticipated d/c is to: SNF              Patient currently is medically stable to d/c.   Difficult to place patient No   Subjective:   Amanda [name] feels okay, denies any specific complaints, sitting up in bed eating breakfast Objective:    Vitals:   03/22/21 2000 03/22/21 2329 03/23/21 0300 03/23/21 0908  BP: (!) 112/57 139/69 140/65 131/66  Pulse: 72 65 66 66  Resp: 18 17 18 18   Temp: 98.6 F (37 C) 98 F (36.7 C) 98.3 F (36.8 C) (!) 97.5 F (36.4 C)  TempSrc: Oral Oral Oral Oral  SpO2: 95% 99% 97% 98%  Weight:      Height:       SpO2: 98 %   Intake/Output Summary (Last 24 hours) at 03/23/2021 1102 Last data filed at 03/23/2021 0754 Gross per 24 hour  Intake 240 ml  Output 250 ml  Net -10 ml    Filed Weights   03/11/21 0737  Weight: 60 kg    Exam: General exam: Pleasant elderly female, sitting up in bed, AAO x2, mild cognitive deficits noted HEENT: Bruise noted on cheek CVS: S1-S2, regular rate rhythm Lungs: Clear bilaterally Abdomen: Soft, nontender, bowel sounds present Extremities: No edema  Skin: No rashes on exposed skin Psych: Poor insight and judgment  Data Reviewed:    Labs: Basic Metabolic Panel: Recent Labs  Lab 03/23/21 0355  NA 140  K 3.9  CL 105  CO2 25  GLUCOSE 109*  BUN 44*  CREATININE 1.22*  CALCIUM 11.8*    GFR Estimated Creatinine Clearance: 26.3 mL/min (A) (by C-G formula based on SCr of 1.22  mg/dL (H)). Liver Function Tests: No results for input(s): AST, ALT, ALKPHOS, BILITOT, PROT, ALBUMIN in the last 168 hours. No results for input(s): LIPASE, AMYLASE in the last 168 hours. No results for input(s): AMMONIA in the last 168 hours. Coagulation profile No results for input(s): INR, PROTIME in the last 168 hours. COVID-19 Labs  No results for input(s): DDIMER, FERRITIN, LDH, CRP in the last 72 hours.  Lab Results  Component Value Date   SARSCOV2NAA NEGATIVE 03/11/2021   [place] NEGATIVE 12/31/2020   [place]  NEGATIVE 04/17/2019    CBC: Recent Labs  Lab 03/23/21 0355  WBC 11.7*  HGB 11.8*  HCT 36.1  MCV 94.5  PLT 149*    Cardiac Enzymes: No results for input(s): CKTOTAL, CKMB, CKMBINDEX, TROPONINI in the last 168 hours. BNP (last 3 results) No results for input(s): PROBNP in the last 8760 hours. CBG: Recent Labs  Lab 03/16/21 1209 03/16/21 1621 03/21/21 0805  GLUCAP 121* 103* 103*   D-Dimer: No results for input(s): DDIMER in the last 72 hours. Hgb A1c: No results for input(s): HGBA1C in the last 72 hours. Lipid Profile: No results for input(s): CHOL, HDL, LDLCALC, TRIG, CHOLHDL, LDLDIRECT in the last 72 hours. Thyroid function studies: No results for input(s): TSH, T4TOTAL, T3FREE, THYROIDAB in the last 72 hours.  Invalid input(s): FREET3 Anemia work up: No results for input(s): VITAMINB12, FOLATE, FERRITIN, TIBC, IRON, RETICCTPCT in the last 72 hours. Sepsis Labs: Recent Labs  Lab 03/23/21 0355  WBC 11.7*    Microbiology No results found for this or any previous visit (from the past 240 hour(s)).    Medications:    atorvastatin  20 mg Oral QHS   carvedilol  3.125 mg Oral BID WC   darifenacin  7.5 mg Oral Daily   feeding supplement  237 mL Oral BID BM   iron polysaccharides  150 mg Oral Daily   pantoprazole  40 mg Oral Daily   sodium chloride flush  3 mL Intravenous Q12H   sodium chloride flush  3 mL Intravenous Q12H   verapamil  180 mg Oral QHS   Continuous Infusions:  sodium chloride 250 mL (03/11/21 2033)      LOS: 11 days   Domenic Polite  [facility]  03/23/2021, 11:02 AM

## 2021-03-23 NOTE — Care Management Important Message (Signed)
Important Message  Patient Details  Name: Amanda [name] MRN: [id number] Date of Birth: 04-18-1931   Medicare Important Message Given:  Yes     Roshun Klingensmith [address] 03/23/2021, 2:14 PM

## 2021-03-23 NOTE — Progress Notes (Signed)
Occupational Therapy Treatment Patient Details Name: Amanda [name] MRN: [id number] DOB: [birth year] Today's Date: 03/23/2021   History of present illness This 87 y.o. female admitted after a significant fall with facial contusion. CT of head showed acute bifrontal hemorrhage, and small volume SAH.   PMH includes: Dementia, CAD, HTN, CVA.   OT comments  Pt is incrementally progressing, she continues to be limited by confusion, impulsivity, activity tolerance and poor postural control. Pt required mod A for be mobility, with place assist and verbal cues. She also required mod A for sit<>stands and stand pivot. Attempted several sit<>stands for better hand placement however pt ignoring therapist/changing hand placement each time. Pt continues to benefit from OT acutely/ D/c recommendation remains appropriate.    Recommendations for follow up therapy are one component of a multi-disciplinary discharge planning process, led by the attending physician.  Recommendations may be updated based on patient status, additional functional criteria and insurance authorization.    Follow Up Recommendations  SNF;Supervision/Assistance - 24 hour    Equipment Recommendations  Tub/shower bench    Recommendations for Other Services      Precautions / Restrictions Precautions Precautions: Fall Precaution Comments: h/o falls per chart Restrictions Weight Bearing Restrictions: No       Mobility Bed Mobility Overal bed mobility: Needs Assistance Bed Mobility: Rolling;Sidelying to Sit;Sit to Supine Rolling: Mod assist Sidelying to sit: Mod assist       General bed mobility comments: mod A for cues, BUE placement, cognition and trunk control.    Transfers Overall transfer level: Needs assistance Equipment used: Rolling walker (2 wheeled) Transfers: Sit to/from Stand Sit to Stand: Mod assist         General transfer comment: pt pulling up from the RW and ignoring therapist cues, mod A physical  assist to boost into standing. mod A for balance in standing, pt ws pushing/leaning backwards    Balance Overall balance assessment: Needs assistance Sitting-balance support: Feet supported Sitting balance-Leahy Scale: Fair Sitting balance - Comments: close min guard assist for static sitting at EOB Postural control: Posterior lean Standing balance support: Bilateral upper extremity supported Standing balance-Leahy Scale: Poor Standing balance comment: mild posterior lean in stance at RW                           ADL either performed or assessed with clinical judgement   ADL Overall ADL's : Needs assistance/impaired     Grooming: Set up;Supervision/safety;Sitting Grooming Details (indicate cue type and reason): requires cues                 Toilet Transfer: Moderate assistance;Stand-pivot Toilet Transfer Details (indicate cue type and reason): with RW - simulated from bed>chair. mod A for sit<>stand and steadying during weightshift in pivot         Functional mobility during ADLs: Moderate assistance;Rolling walker General ADL Comments: pt impulsive with OOB with poor insight into deficits     Vision       Perception     Praxis      Cognition Arousal/Alertness: Awake/alert Behavior During Therapy: Impulsive Overall Cognitive Status: No family/caregiver present to determine baseline cognitive functioning                   Orientation Level: Disoriented to;Situation;Time;Place Current Attention Level: Focused     Safety/Judgement: Decreased awareness of safety;Decreased awareness of deficits   Problem Solving: Difficulty sequencing;Requires verbal cues;Requires tactile cues;Slow processing;Decreased initiation  Exercises     Shoulder Instructions       General Comments      Pertinent Vitals/ Pain       Pain Assessment: Faces Faces Pain Scale: Hurts little more Pain Location: pt stated stomahce - seemingly generalized  with mobility Pain Descriptors / Indicators: Guarding;Grimacing Pain Intervention(s): Limited activity within patient's tolerance  Home Living                                          Prior Functioning/Environment              Frequency  Min 2X/week        Progress Toward Goals  OT Goals(current goals can now be found in the care plan section)  Progress towards OT goals: Progressing toward goals  Acute Rehab OT Goals Patient Stated Goal: pt unable to state -cognitive deficit OT Goal Formulation: Patient unable to participate in goal setting Time For Goal Achievement: 03/26/21 Potential to Achieve Goals: Good  Plan Discharge plan remains appropriate    Co-evaluation                 AM-PAC OT "6 Clicks" Daily Activity     Outcome Measure   Help from another person eating meals?: A Little Help from another person taking care of personal grooming?: A Lot Help from another person toileting, which includes using toliet, bedpan, or urinal?: A Lot Help from another person bathing (including washing, rinsing, drying)?: A Lot Help from another person to put on and taking off regular upper body clothing?: A Lot Help from another person to put on and taking off regular lower body clothing?: A Lot 6 Click Score: 13    End of Session Equipment Utilized During Treatment: Gait belt;Rolling walker  OT Visit Diagnosis: Unsteadiness on feet (R26.81);Cognitive communication deficit (R41.841);Repeated falls (R29.6)   Activity Tolerance Patient tolerated treatment well   Patient Left in chair;with call bell/phone within reach;with chair alarm set   Nurse Communication Mobility status        Time: 4536-4680 OT Time Calculation (min): 21 min  Charges: OT General Charges $OT Visit: 1 Visit OT Treatments $Therapeutic Activity: 8-22 mins   Jalen Daluz A Jax Kentner 03/23/2021, 5:03 PM

## 2021-03-24 DIAGNOSIS — N179 Acute kidney failure, unspecified: Secondary | ICD-10-CM

## 2021-03-24 MED ORDER — SODIUM CHLORIDE 0.9 % IV BOLUS
1000.0000 mL | Freq: Once | INTRAVENOUS | Status: AC
  Administered 2021-03-24: 1000 mL via INTRAVENOUS

## 2021-03-24 NOTE — Progress Notes (Signed)
[facility] PROGRESS NOTE    Progress Note  Amanda [name]  NWG:[id number] DOB: 1931-04-24 DOA: 03/11/2021 PCP: [name], DO     Brief Narrative:   Amanda [name] is an 87 y.o. female past medical history significant for prior stroke, dyslipidemia essential hypertension advanced dementia comes into the ED after a significant fall with facial contusion and ecchymosis.  In the ED CT of the head without contrast was done that showed acute bifrontal hemorrhage contusion and small volume subarachnoid hemorrhage CT of the C-spine and maxillofacial showed no fracture or dislocation, the case was discussed with neurosurgery who recommended repeat CT scan on 03/12/2021.  She was also started on IV Keppra and controlling her blood pressure for MAP of 70. The case was discussed with neurosurgery who recommended to repeat a CT scan on 03/12/2021 that showed stable bilateral frontal lobe hemorrhagic intraparenchymal confusion associated edema but no mass-effect.No new infarcts.  Awaiting insurance authorization Assessment/Plan:   Traumatic intracranial hemorrhage: Likely due to recurrent falls, the patient has had several falls at home. Physical therapy evaluated the patient recommended skilled nursing facility. Discharge planning is awaiting insurance authorization.  Advance dementia/frequent falls: Patient lives with his son despite this she continues to have recurrent falls. She is at high risk of acute confusional state and aspiration pneumonia. Use Haldol IV as needed for agitation. Cont keppra.  Essential hypertension: Continue Norvasc and Coreg pressures been relatively well controlled MAP has been between 100-90.  History of prior CVAs: Holding aspirin due to [place] continue Lipitor.  Ethics/goals of care: Patient is a DNR.  Chronic diastolic heart failure : with an EF of 70% and grade 1 diastolic heart failure, she appears to be compensated continue Coreg. Diuretics have been  discontinued.  Acute kidney injury: Baseline creatinine blood around 0.9, recheck is 1.2, likely hemodynamically mediated. Give a bolus recheck tomorrow morning.   DVT prophylaxis: scd Family Communication:son Status is: Observation  The patient remains OBS appropriate and will d/c before 2 midnights.  Dispo: The patient is from: Home              Anticipated d/c is to: SNF              Patient currently is not medically stable to d/c.   Difficult to place patient No   Code Status:     Code Status Orders  (From admission, onward)           Start     Ordered   03/11/21 1615  Do not attempt resuscitation (DNR)  Continuous       Question Answer Comment  In the event of cardiac or respiratory ARREST Do not call a "code blue"   In the event of cardiac or respiratory ARREST Do not perform Intubation, CPR, defibrillation or ACLS   In the event of cardiac or respiratory ARREST Use medication by any route, position, wound care, and other measures to relive pain and suffering. May use oxygen, suction and manual treatment of airway obstruction as needed for comfort.   Comments D/w Son and Daughter      03/11/21 1616           Code Status History     Date Active Date Inactive Code Status Order ID Comments User Context   03/11/2021 1138 03/11/2021 1616 DNR [id number]  Carmin Muskrat, MD ED   12/31/2020 2305 01/01/2021 2218 Full Code [id number]  Bethena Roys, MD ED   04/09/2013 1918 04/11/2013 1559 Full  Code [id number]  Hosie Poisson, MD Inpatient         IV Access:   Peripheral IV   Procedures and diagnostic studies:   No results found.   Medical Consultants:   None.   Subjective:    Amanda [name] no complaints Objective:    Vitals:   03/23/21 2040 03/24/21 0045 03/24/21 0300 03/24/21 0809  BP: 114/66 (!) 116/54 (!) 94/52 119/60  Pulse: 75 68 67 67  Resp: 18 15 17 18   Temp: 97.9 F (36.6 C) 98.1 F (36.7 C) 98.1 F (36.7 C) 98 F (36.7 C)   TempSrc: Oral Axillary Oral Oral  SpO2: 97% 96% 95% 99%  Weight:      Height:       SpO2: 99 %   Intake/Output Summary (Last 24 hours) at 03/24/2021 0835 Last data filed at 03/23/2021 1323 Gross per 24 hour  Intake 200 ml  Output --  Net 200 ml    Filed Weights   03/11/21 0737  Weight: 60 kg    Exam: General exam: In no acute distress. Respiratory system: Good air movement and clear to auscultation. Cardiovascular system: S1 & S2 heard, RRR. No JVD. Gastrointestinal system: Abdomen is nondistended, soft and nontender.  Extremities: No pedal edema. Skin: No rashes, lesions or ulcers Psychiatry: Judgement and insight appear normal. Mood & affect appropriate.  Data Reviewed:    Labs: Basic Metabolic Panel: Recent Labs  Lab 03/23/21 0355  NA 140  K 3.9  CL 105  CO2 25  GLUCOSE 109*  BUN 44*  CREATININE 1.22*  CALCIUM 11.8*    GFR Estimated Creatinine Clearance: 26.3 mL/min (A) (by C-G formula based on SCr of 1.22 mg/dL (H)). Liver Function Tests: No results for input(s): AST, ALT, ALKPHOS, BILITOT, PROT, ALBUMIN in the last 168 hours. No results for input(s): LIPASE, AMYLASE in the last 168 hours. No results for input(s): AMMONIA in the last 168 hours. Coagulation profile No results for input(s): INR, PROTIME in the last 168 hours. COVID-19 Labs  No results for input(s): DDIMER, FERRITIN, LDH, CRP in the last 72 hours.  Lab Results  Component Value Date   SARSCOV2NAA NEGATIVE 03/11/2021   [place] NEGATIVE 12/31/2020   [place] NEGATIVE 04/17/2019    CBC: Recent Labs  Lab 03/23/21 0355  WBC 11.7*  HGB 11.8*  HCT 36.1  MCV 94.5  PLT 149*    Cardiac Enzymes: No results for input(s): CKTOTAL, CKMB, CKMBINDEX, TROPONINI in the last 168 hours. BNP (last 3 results) No results for input(s): PROBNP in the last 8760 hours. CBG: Recent Labs  Lab 03/21/21 0805  GLUCAP 103*    D-Dimer: No results for input(s): DDIMER in the last 72  hours. Hgb A1c: No results for input(s): HGBA1C in the last 72 hours. Lipid Profile: No results for input(s): CHOL, HDL, LDLCALC, TRIG, CHOLHDL, LDLDIRECT in the last 72 hours. Thyroid function studies: No results for input(s): TSH, T4TOTAL, T3FREE, THYROIDAB in the last 72 hours.  Invalid input(s): FREET3 Anemia work up: No results for input(s): VITAMINB12, FOLATE, FERRITIN, TIBC, IRON, RETICCTPCT in the last 72 hours. Sepsis Labs: Recent Labs  Lab 03/23/21 0355  WBC 11.7*    Microbiology No results found for this or any previous visit (from the past 240 hour(s)).    Medications:    atorvastatin  20 mg Oral QHS   carvedilol  3.125 mg Oral BID WC   darifenacin  7.5 mg Oral Daily   feeding supplement  237 mL Oral  BID BM   iron polysaccharides  150 mg Oral Daily   pantoprazole  40 mg Oral Daily   sodium chloride flush  3 mL Intravenous Q12H   sodium chloride flush  3 mL Intravenous Q12H   verapamil  180 mg Oral QHS   Continuous Infusions:  sodium chloride 250 mL (03/11/21 2033)      LOS: 12 days   Charlynne Cousins  [facility]  03/24/2021, 8:35 AM

## 2021-03-25 ENCOUNTER — Ambulatory Visit: Payer: Medicare HMO | Admitting: Gastroenterology

## 2021-03-25 LAB — BASIC METABOLIC PANEL
Anion gap: 7 (ref 5–15)
BUN: 38 mg/dL — ABNORMAL HIGH (ref 8–23)
CO2: 27 mmol/L (ref 22–32)
Calcium: 11.6 mg/dL — ABNORMAL HIGH (ref 8.9–10.3)
Chloride: 104 mmol/L (ref 98–111)
Creatinine, Ser: 1.09 mg/dL — ABNORMAL HIGH (ref 0.44–1.00)
GFR, Estimated: 49 mL/min — ABNORMAL LOW (ref >60)
Glucose, Bld: 133 mg/dL — ABNORMAL HIGH (ref 70–99)
Potassium: 4 mmol/L (ref 3.5–5.1)
Sodium: 138 mmol/L (ref 135–145)

## 2021-03-25 NOTE — Plan of Care (Signed)
Pt is alert x 1. Pt denies pain. Family member Shelia called for an update. Pt resting with eyes closed.   Problem: Education: Goal: Knowledge of General Education information will improve Description: Including pain rating scale, medication(s)/side effects and non-pharmacologic comfort measures Outcome: Progressing   Problem: Health Behavior/Discharge Planning: Goal: Ability to manage health-related needs will improve Outcome: Progressing   Problem: Clinical Measurements: Goal: Ability to maintain clinical measurements within normal limits will improve Outcome: Progressing Goal: Will remain free from infection Outcome: Progressing Goal: Diagnostic test results will improve Outcome: Progressing Goal: Respiratory complications will improve Outcome: Progressing Goal: Cardiovascular complication will be avoided Outcome: Progressing   Problem: Activity: Goal: Risk for activity intolerance will decrease Outcome: Progressing   Problem: Nutrition: Goal: Adequate nutrition will be maintained Outcome: Progressing   Problem: Coping: Goal: Level of anxiety will decrease Outcome: Progressing   Problem: Elimination: Goal: Will not experience complications related to bowel motility Outcome: Progressing Goal: Will not experience complications related to urinary retention Outcome: Progressing   Problem: Elimination: Goal: Will not experience complications related to bowel motility Outcome: Progressing Goal: Will not experience complications related to urinary retention Outcome: Progressing   Problem: Pain Managment: Goal: General experience of comfort will improve Outcome: Progressing   Problem: Safety: Goal: Ability to remain free from injury will improve Outcome: Progressing   Problem: Skin Integrity: Goal: Risk for impaired skin integrity will decrease Outcome: Progressing

## 2021-03-25 NOTE — Plan of Care (Signed)
Pt is in bed resting. Pt has purewick, pt needs assistance with turning in bed. Pt took medications whole, no complications noted. Bed alarms on, sleep promoted, lights out. Pt resting.   Problem: Education: Goal: Knowledge of General Education information will improve Description: Including pain rating scale, medication(s)/side effects and non-pharmacologic comfort measures Outcome: Progressing   Problem: Health Behavior/Discharge Planning: Goal: Ability to manage health-related needs will improve Outcome: Progressing   Problem: Clinical Measurements: Goal: Ability to maintain clinical measurements within normal limits will improve Outcome: Progressing Goal: Will remain free from infection Outcome: Progressing Goal: Diagnostic test results will improve Outcome: Progressing Goal: Respiratory complications will improve Outcome: Progressing Goal: Cardiovascular complication will be avoided Outcome: Progressing   Problem: Activity: Goal: Risk for activity intolerance will decrease Outcome: Progressing   Problem: Nutrition: Goal: Adequate nutrition will be maintained Outcome: Progressing   Problem: Coping: Goal: Level of anxiety will decrease Outcome: Progressing   Problem: Elimination: Goal: Will not experience complications related to bowel motility Outcome: Progressing Goal: Will not experience complications related to urinary retention Outcome: Progressing   Problem: Pain Managment: Goal: General experience of comfort will improve Outcome: Progressing   Problem: Safety: Goal: Ability to remain free from injury will improve Outcome: Progressing   Problem: Skin Integrity: Goal: Risk for impaired skin integrity will decrease Outcome: Progressing

## 2021-03-25 NOTE — Plan of Care (Signed)

## 2021-03-25 NOTE — Progress Notes (Signed)
Physical Therapy Treatment Patient Details Name: Amanda [name] MRN: [id number] DOB: 04-01-1931 Today's Date: 03/25/2021   History of Present Illness This 87 y.o. female admitted after a significant fall with facial contusion. CT of head showed acute bifrontal hemorrhage, and small volume SAH.   PMH includes: Dementia, CAD, HTN, CVA.    PT Comments    Pt received in supine, alert and oriented to self, agreeable and cooperative with functional mobility tasks. Pt with improved seated/standing tolerance this date, able to progress short gait trial in room with minA +RW and dense cues for safety/sequencing. Emphasis on safe hand placement with transfers, fall risk prevention, standing balance tasks, safe use of RW, gait training. Pt continues to benefit from PT services to progress toward functional mobility goals. Pt remains below functional baseline, continue to recommend SNF.   Recommendations for follow up therapy are one component of a multi-disciplinary discharge planning process, led by the attending physician.  Recommendations may be updated based on patient status, additional functional criteria and insurance authorization.  Follow Up Recommendations  SNF;Supervision/Assistance - 24 hour     Equipment Recommendations  Other (comment) (defer to next venue of care)    Recommendations for Other Services       Precautions / Restrictions Precautions Precautions: Fall Precaution Comments: h/o falls per chart Restrictions Weight Bearing Restrictions: No     Mobility  Bed Mobility Overal bed mobility: Needs Assistance Bed Mobility: Sidelying to Sit   Sidelying to sit: Min assist;HOB elevated;Min guard       General bed mobility comments: Min guard for trunk rise, minA for transfer pad assist with B hip translation to foot flat    Transfers Overall transfer level: Needs assistance Equipment used: Rolling walker (2 wheeled) Transfers: Sit to/from Stand Sit to Stand: Min  assist         General transfer comment: pt with poor carryover of cueing for safe hand placement within session  Ambulation/Gait Ambulation/Gait assistance: Min assist Gait Distance (Feet): 18 Feet (to/from toilet (18 x2)) Assistive device: Rolling walker (2 wheeled) Gait Pattern/deviations: Trunk flexed;Narrow base of support;Decreased stride length;Decreased dorsiflexion - left;Decreased dorsiflexion - right;Drifts right/left Gait velocity: Decreased   General Gait Details: poor management of RW needs dense cues for posture, safety, some for step sequencing; no overt LOB   Stairs             Wheelchair Mobility    Modified Rankin (Stroke Patients Only) Modified Rankin (Stroke Patients Only) Pre-Morbid Rankin Score: Moderate disability Modified Rankin: Moderately severe disability     Balance Overall balance assessment: Needs assistance Sitting-balance support: Feet supported Sitting balance-Leahy Scale: Fair Sitting balance - Comments: min guard to close Supervision assist for static sitting at EOB Postural control: Posterior lean Standing balance support: Bilateral upper extremity supported;During functional activity Standing balance-Leahy Scale: Poor Standing balance comment: mild posterior lean in stance at RW improved with time upright                            Cognition Arousal/Alertness: Awake/alert Behavior During Therapy: Impulsive Overall Cognitive Status: No family/caregiver present to determine baseline cognitive functioning Area of Impairment: Orientation;Attention;Memory;Following commands;Safety/judgement;Awareness;Problem solving                 Orientation Level: Disoriented to;Situation;Time;Place Current Attention Level: Focused Memory: Decreased short-term memory;Decreased recall of precautions Following Commands: Follows one step commands inconsistently;Follows one step commands with increased time Safety/Judgement:  Decreased awareness of safety;Decreased  awareness of deficits Awareness: Intellectual Problem Solving: Difficulty sequencing;Requires verbal cues;Requires tactile cues;Slow processing;Decreased initiation General Comments: pt more alert today, limited carryover of cues for safety/hand placement, cooperative as able      Exercises      General Comments General comments (skin integrity, edema, etc.): incontinent of bowel/bladder needs assist with hygiene/peri care      Pertinent Vitals/Pain Pain Assessment: Faces Faces Pain Scale: Hurts a little bit Pain Location: pt pointing to chest/shoulders, no other acute s/sx distress Pain Descriptors / Indicators: Grimacing;Discomfort Pain Intervention(s): Limited activity within patient's tolerance;Monitored during session;Repositioned    Home Living                      Prior Function            PT Goals (current goals can now be found in the care plan section) Acute Rehab PT Goals Patient Stated Goal: pt unable to state -cognitive deficit PT Goal Formulation: Patient unable to participate in goal setting Time For Goal Achievement: 03/26/21 Progress towards PT goals: Progressing toward goals    Frequency    Min 3X/week      PT Plan Current plan remains appropriate    Co-evaluation              AM-PAC PT "6 Clicks" Mobility   Outcome Measure  Help needed turning from your back to your side while in a flat bed without using bedrails?: A Little Help needed moving from lying on your back to sitting on the side of a flat bed without using bedrails?: A Little Help needed moving to and from a bed to a chair (including a wheelchair)?: A Little Help needed standing up from a chair using your arms (e.g., wheelchair or bedside chair)?: A Little Help needed to walk in hospital room?: A Lot (dense cues for safety) Help needed climbing 3-5 steps with a railing? : Total 6 Click Score: 15    End of Session Equipment  Utilized During Treatment: Gait belt Activity Tolerance: Patient tolerated treatment well Patient left: in chair;with chair alarm set;with call bell/phone within reach;Other (comment) (purewick in place 2/2 incontinence) Nurse Communication: Mobility status;Other (comment) (likely will need another bath, incontinence) PT Visit Diagnosis: Unsteadiness on feet (R26.81);Repeated falls (R29.6)     Time: 1696-7893 PT Time Calculation (min) (ACUTE ONLY): 29 min  Charges:  $Gait Training: 8-22 mins $Therapeutic Activity: 8-22 mins                     Hayzlee Mcsorley P., PTA Acute Rehabilitation Services Pager: [phone] Office: [place] 03/25/2021, 12:08 PM

## 2021-03-25 NOTE — Progress Notes (Signed)
[facility] PROGRESS NOTE    Progress Note  Amanda [name]  EQA:[id number] DOB: 1931-04-05 DOA: 03/11/2021 PCP: [name], DO     Brief Narrative:   Amanda [name] is an 87 y.o. female past medical history significant for prior stroke, dyslipidemia essential hypertension advanced dementia comes into the ED after a significant fall with facial contusion and ecchymosis.  In the ED CT of the head without contrast was done that showed acute bifrontal hemorrhage contusion and small volume subarachnoid hemorrhage CT of the C-spine and maxillofacial showed no fracture or dislocation, the case was discussed with neurosurgery who recommended repeat CT scan on 03/12/2021.  She was also started on IV Keppra and controlling her blood pressure for MAP of 70. The case was discussed with neurosurgery who recommended to repeat a CT scan on 03/12/2021 that showed stable bilateral frontal lobe hemorrhagic intraparenchymal confusion associated edema but no mass-effect.No new infarcts.  Awaiting insurance authorization Assessment/Plan:   Traumatic intracranial hemorrhage: Likely due to recurrent falls, the patient has had several falls at home. Physical therapy evaluated the patient recommended skilled nursing facility. Discharge planning is awaiting insurance authorization.  Advance dementia/frequent falls: Patient lives with his son despite this she continues to have recurrent falls. She is at high risk of acute confusional state and aspiration pneumonia. Use Haldol IV as needed for agitation. Cont keppra.  Essential hypertension: Continue Norvasc and Coreg pressures been relatively well controlled MAP has been between 100-90.  History of prior CVAs: Holding aspirin due to [place] continue Lipitor.  Ethics/goals of care: Patient is a DNR.  Chronic diastolic heart failure : with an EF of 70% and grade 1 diastolic heart failure, she appears to be compensated continue Coreg. Diuretics have been  discontinued.  Acute kidney injury: Baseline creatinine blood around 0.9, recheck is 1.2, likely hemodynamically mediated. Give a bolus recheck tomorrow morning.   DVT prophylaxis: scd Family Communication:son Status is: Observation  The patient remains OBS appropriate and will d/c before 2 midnights.  Dispo: The patient is from: Home              Anticipated d/c is to: SNF              Patient currently is not medically stable to d/c.   Difficult to place patient No   Code Status:     Code Status Orders  (From admission, onward)           Start     Ordered   03/11/21 1615  Do not attempt resuscitation (DNR)  Continuous       Question Answer Comment  In the event of cardiac or respiratory ARREST Do not call a "code blue"   In the event of cardiac or respiratory ARREST Do not perform Intubation, CPR, defibrillation or ACLS   In the event of cardiac or respiratory ARREST Use medication by any route, position, wound care, and other measures to relive pain and suffering. May use oxygen, suction and manual treatment of airway obstruction as needed for comfort.   Comments D/w Son and Daughter      03/11/21 1616           Code Status History     Date Active Date Inactive Code Status Order ID Comments User Context   03/11/2021 1138 03/11/2021 1616 DNR [id number]  Carmin Muskrat, MD ED   12/31/2020 2305 01/01/2021 2218 Full Code [id number]  Bethena Roys, MD ED   04/09/2013 1918 04/11/2013 1559 Full  Code [id number]  Hosie Poisson, MD Inpatient         IV Access:   Peripheral IV   Procedures and diagnostic studies:   No results found.   Medical Consultants:   None.   Subjective:    Amanda [name] has no complaints today. Objective:    Vitals:   03/24/21 2118 03/25/21 0041 03/25/21 0406 03/25/21 0828  BP: 135/74 (!) 141/78 (!) 165/83 121/65  Pulse: 75 75 75 67  Resp: 15 18 20    Temp: 98.8 F (37.1 C) 99 F (37.2 C) 98.3 F (36.8 C)   TempSrc:  Oral Oral Oral   SpO2: 94% 96% 98% 96%  Weight:      Height:       SpO2: 96 %   Intake/Output Summary (Last 24 hours) at 03/25/2021 0954 Last data filed at 03/25/2021 0200 Gross per 24 hour  Intake 1090 ml  Output --  Net 1090 ml    Filed Weights   03/11/21 0737  Weight: 60 kg    Exam: General exam: In no acute distress. Respiratory system: Good air movement and clear to auscultation. Cardiovascular system: S1 & S2 heard, RRR. No JVD. Gastrointestinal system: Abdomen is nondistended, soft and nontender.  Extremities: No pedal edema. Skin: No rashes, lesions or ulcers  Data Reviewed:    Labs: Basic Metabolic Panel: Recent Labs  Lab 03/23/21 0355 03/25/21 0211  NA 140 138  K 3.9 4.0  CL 105 104  CO2 25 27  GLUCOSE 109* 133*  BUN 44* 38*  CREATININE 1.22* 1.09*  CALCIUM 11.8* 11.6*    GFR Estimated Creatinine Clearance: 29.4 mL/min (A) (by C-G formula based on SCr of 1.09 mg/dL (H)). Liver Function Tests: No results for input(s): AST, ALT, ALKPHOS, BILITOT, PROT, ALBUMIN in the last 168 hours. No results for input(s): LIPASE, AMYLASE in the last 168 hours. No results for input(s): AMMONIA in the last 168 hours. Coagulation profile No results for input(s): INR, PROTIME in the last 168 hours. COVID-19 Labs  No results for input(s): DDIMER, FERRITIN, LDH, CRP in the last 72 hours.  Lab Results  Component Value Date   SARSCOV2NAA NEGATIVE 03/11/2021   [place] NEGATIVE 12/31/2020   [place] NEGATIVE 04/17/2019    CBC: Recent Labs  Lab 03/23/21 0355  WBC 11.7*  HGB 11.8*  HCT 36.1  MCV 94.5  PLT 149*    Cardiac Enzymes: No results for input(s): CKTOTAL, CKMB, CKMBINDEX, TROPONINI in the last 168 hours. BNP (last 3 results) No results for input(s): PROBNP in the last 8760 hours. CBG: Recent Labs  Lab 03/21/21 0805  GLUCAP 103*    D-Dimer: No results for input(s): DDIMER in the last 72 hours. Hgb A1c: No results for input(s):  HGBA1C in the last 72 hours. Lipid Profile: No results for input(s): CHOL, HDL, LDLCALC, TRIG, CHOLHDL, LDLDIRECT in the last 72 hours. Thyroid function studies: No results for input(s): TSH, T4TOTAL, T3FREE, THYROIDAB in the last 72 hours.  Invalid input(s): FREET3 Anemia work up: No results for input(s): VITAMINB12, FOLATE, FERRITIN, TIBC, IRON, RETICCTPCT in the last 72 hours. Sepsis Labs: Recent Labs  Lab 03/23/21 0355  WBC 11.7*    Microbiology No results found for this or any previous visit (from the past 240 hour(s)).    Medications:    atorvastatin  20 mg Oral QHS   carvedilol  3.125 mg Oral BID WC   darifenacin  7.5 mg Oral Daily   feeding supplement  237 mL Oral BID  BM   iron polysaccharides  150 mg Oral Daily   pantoprazole  40 mg Oral Daily   sodium chloride flush  3 mL Intravenous Q12H   sodium chloride flush  3 mL Intravenous Q12H   verapamil  180 mg Oral QHS   Continuous Infusions:  sodium chloride 250 mL (03/11/21 2033)      LOS: 13 days   Charlynne Cousins  [facility]  03/25/2021, 9:54 AM

## 2021-03-26 ENCOUNTER — Ambulatory Visit: Payer: Medicare PPO | Admitting: Gastroenterology

## 2021-03-26 LAB — URINALYSIS, COMPLETE (UACMP) WITH MICROSCOPIC
Bacteria, UA: NONE SEEN
Bilirubin Urine: NEGATIVE
Glucose, UA: NEGATIVE mg/dL
Ketones, ur: NEGATIVE mg/dL
Nitrite: NEGATIVE
Protein, ur: NEGATIVE mg/dL
Specific Gravity, Urine: 1.012 (ref 1.005–1.030)
pH: 7 (ref 5.0–8.0)

## 2021-03-26 NOTE — Progress Notes (Signed)
I&O cath completed pt had total of 1637mlsin bladder. Pt abdomen and bladder are no longer distended. Urine sample collected.

## 2021-03-26 NOTE — Progress Notes (Signed)
Occupational Therapy Treatment Patient Details Name: Amanda [name] MRN: [id number] DOB: 05/09/1931 Today's Date: 03/26/2021   History of present illness This 87 y.o. female admitted after a significant fall with facial contusion. CT of head showed acute bifrontal hemorrhage, and small volume SAH.   PMH includes: Dementia, CAD, HTN, CVA.   OT comments  Pt. Seen for skilled OT treatment session.  Pt. Awake and agreeable to participation.  Bed mobility with mod a and cues.  Able to completed stand pivot to bsc.  Max/total for peri care once in bed.  Remains impulsive but was easily directed to task.  Current follow up recommendations remain appropriate.     Recommendations for follow up therapy are one component of a multi-disciplinary discharge planning process, led by the attending physician.  Recommendations may be updated based on patient status, additional functional criteria and insurance authorization.    Follow Up Recommendations  SNF;Supervision/Assistance - 24 hour    Equipment Recommendations  Tub/shower bench    Recommendations for Other Services      Precautions / Restrictions Precautions Precautions: Fall Precaution Comments: h/o falls per chart       Mobility Bed Mobility Overal bed mobility: Needs Assistance Bed Mobility: Rolling;Sidelying to Sit Rolling: Min guard Sidelying to sit: Min guard;HOB elevated Supine to sit: Mod assist     General bed mobility comments: cues for hand placement on bed rails but able to follow cues once hands placed.  intially able to bring bles off of bed but required assistance to cont. to scoot and get b feet on the floor    Transfers                      Balance                                           ADL either performed or assessed with clinical judgement   ADL Overall ADL's : Needs assistance/impaired                         Toilet Transfer: Moderate  assistance;Stand-pivot;BSC Toilet Transfer Details (indicate cue type and reason): eob to 3n1 stand pivot with no dme, utilized arm rests of bsc for ue support during transfer Toileting- Clothing Manipulation and Hygiene: Bed level;Total assistance Toileting - Clothing Manipulation Details (indicate cue type and reason): pt. noted to have bm once back in bed, roll L/R for clean up       General ADL Comments: some impulsivity noted but easily redirected.  able to complete portions of familiar tasks but required intemittent cues for intiation, and full completion.  easily distracted     Vision       Perception     Praxis      Cognition Arousal/Alertness: Awake/alert Behavior During Therapy: Impulsive Overall Cognitive Status: No family/caregiver present to determine baseline cognitive functioning Area of Impairment: Orientation;Attention;Memory;Following commands;Safety/judgement;Awareness;Problem solving                 Orientation Level: Disoriented to;Situation;Time;Place Current Attention Level: Focused Memory: Decreased short-term memory;Decreased recall of precautions Following Commands: Follows one step commands inconsistently;Follows one step commands with increased time Safety/Judgement: Decreased awareness of safety;Decreased awareness of deficits Awareness: Intellectual Problem Solving: Difficulty sequencing;Requires verbal cues;Requires tactile cues;Slow processing;Decreased initiation General Comments: "i cant wait to get home to my dog" initally  struggled with trying to recall any information about the dog but able to answer this or that questions, ie: boy/girl-boy big/small-small, old/young-young. after a momement able to recall his name "rusty"        Exercises     Shoulder Instructions       General Comments      Pertinent Vitals/ Pain       Pain Assessment: No/denies pain  Home Living                                           Prior Functioning/Environment              Frequency  Min 2X/week        Progress Toward Goals  OT Goals(current goals can now be found in the care plan section)  Progress towards OT goals: Progressing toward goals  ADL Goals Pt Will Perform Lower Body Bathing: with min assist;sit to/from stand Pt Will Transfer to Toilet: with min guard assist;ambulating;bedside commode Pt Will Perform Toileting - Clothing Manipulation and hygiene: with min guard assist;sit to/from stand;sitting/lateral leans  Plan Discharge plan remains appropriate    Co-evaluation                 AM-PAC OT "6 Clicks" Daily Activity     Outcome Measure   Help from another person eating meals?: A Little Help from another person taking care of personal grooming?: A Lot Help from another person toileting, which includes using toliet, bedpan, or urinal?: A Lot Help from another person bathing (including washing, rinsing, drying)?: A Lot Help from another person to put on and taking off regular upper body clothing?: A Lot Help from another person to put on and taking off regular lower body clothing?: A Lot 6 Click Score: 13    End of Session    OT Visit Diagnosis: Unsteadiness on feet (R26.81);Cognitive communication deficit (R41.841);Repeated falls (R29.6)   Activity Tolerance Patient tolerated treatment well   Patient Left in bed;with call bell/phone within reach;with bed alarm set   Nurse Communication          Time: [phone] OT Time Calculation (min): 18 min  Charges: OT General Charges $OT Visit: 1 Visit OT Treatments $Self Care/Home Management : 8-22 mins  Sonia Baller, COTA/L Acute Rehabilitation [phone]   Tanya Nones 03/26/2021, 9:31 AM

## 2021-03-26 NOTE — Progress Notes (Signed)
Physical Therapy Treatment Patient Details Name: Amanda [name] MRN: [id number] DOB: 07-Dec-1930 Today's Date: 03/26/2021   History of Present Illness This 87 y.o. female admitted 03/11/21 after a significant fall with facial contusion. CT of head showed acute bifrontal hemorrhage, and small volume SAH.   PMH includes: Dementia, CAD, HTN, CVA.    PT Comments    Pt is progressing with gait into the hallway today.  Was beneficial to have second person to follow with chair, but this could also be accomplished with a rollator.  Pt reports she generally did not feel well, kept reaching to her head as if she had a HA, but did not report pain.  VSS when checked EOB on RA.  PT will continue to follow acutely for safe mobility progression.  Recommendations for follow up therapy are one component of a multi-disciplinary discharge planning process, led by the attending physician.  Recommendations may be updated based on patient status, additional functional criteria and insurance authorization.  Follow Up Recommendations  SNF;Supervision/Assistance - 24 hour     Equipment Recommendations       Recommendations for Other Services       Precautions / Restrictions Precautions Precautions: Fall Precaution Comments: h/o falls per chart, h/o falls per chart Restrictions Weight Bearing Restrictions: No     Mobility  Bed Mobility Overal bed mobility: Needs Assistance Bed Mobility: Supine to Sit;Sit to Supine Rolling: Min guard Sidelying to sit: Mod assist Supine to sit: Mod assist     General bed mobility comments: Assist at trunk and to help progress bil legs over EOB, cues and hand over hand assist for sequencing/hand placement on rails.    Transfers Overall transfer level: Needs assistance Equipment used: Rolling walker (2 wheeled) Transfers: Sit to/from Stand Sit to Stand: Min assist;+2 safety/equipment;Mod assist         General transfer comment: Two person min to mod assist to come  to standing (less with repeated stands).  Assist at trunk and to stabilize RW.  Ambulation/Gait Ambulation/Gait assistance: Min assist;+2 safety/equipment;Mod assist Gait Distance (Feet): 75 Feet (x2, 3 min seated rest) Assistive device: Rolling walker (2 wheeled) Gait Pattern/deviations: Step-through pattern;Drifts right/left;Trunk flexed     General Gait Details: assist needed at trunk for balance, one LOB while turning requiring mod assist to recover,  Decreased functional strength on left leg (a little more sluggish to progress forward motion)   Stairs             Wheelchair Mobility    Modified Rankin (Stroke Patients Only) Modified Rankin (Stroke Patients Only) Pre-Morbid Rankin Score: Moderate disability Modified Rankin: Moderately severe disability     Balance Overall balance assessment: Needs assistance Sitting-balance support: Feet supported;Bilateral upper extremity supported Sitting balance-Leahy Scale: Fair     Standing balance support: Bilateral upper extremity supported Standing balance-Leahy Scale: Poor Standing balance comment: needs support from RW and therapist in standing                            Cognition Arousal/Alertness: Awake/alert Behavior During Therapy: Flat affect Overall Cognitive Status: Impaired/Different from baseline Area of Impairment: Orientation;Attention;Memory;Following commands;Safety/judgement;Awareness;Problem solving                 Orientation Level: Disoriented to;Place;Time;Situation Current Attention Level: Sustained Memory: Decreased recall of precautions;Decreased short-term memory Following Commands: Follows one step commands inconsistently;Follows one step commands with increased time Safety/Judgement: Decreased awareness of safety;Decreased awareness of deficits Awareness: Intellectual Problem  Solving: Difficulty sequencing;Requires verbal cues;Requires tactile cues;Slow processing;Decreased  initiation General Comments: very quiet today, seemed dizzy and seemed to have a HA despite reports.  She could only describe that she did not feel good. VSS      Exercises      General Comments General comments (skin integrity, edema, etc.): Despite "feeling bad" pt's VSS on RA throughout.      Pertinent Vitals/Pain Pain Assessment: No/denies pain Faces Pain Scale: Hurts little more Pain Location: despite denying pain she keeps reaching for her forhead as if she has a HA Pain Descriptors / Indicators: Grimacing;Guarding Pain Intervention(s): Monitored during session;Limited activity within patient's tolerance;Repositioned;Other (comment) (checked BPs-stable)    Home Living                      Prior Function            PT Goals (current goals can now be found in the care plan section) Acute Rehab PT Goals Patient Stated Goal: pt unable to state -cognitive deficit PT Goal Formulation: Patient unable to participate in goal setting Time For Goal Achievement: 04/09/21 Potential to Achieve Goals: Good Progress towards PT goals: Progressing toward goals    Frequency    Min 3X/week      PT Plan Current plan remains appropriate    Co-evaluation              AM-PAC PT "6 Clicks" Mobility   Outcome Measure  Help needed turning from your back to your side while in a flat bed without using bedrails?: A Little Help needed moving from lying on your back to sitting on the side of a flat bed without using bedrails?: A Little Help needed moving to and from a bed to a chair (including a wheelchair)?: A Little Help needed standing up from a chair using your arms (e.g., wheelchair or bedside chair)?: A Lot Help needed to walk in hospital room?: A Lot Help needed climbing 3-5 steps with a railing? : A Lot 6 Click Score: 15    End of Session Equipment Utilized During Treatment: Gait belt Activity Tolerance: Patient limited by fatigue;Other (comment) (limited by not  feeling well) Patient left: in bed;with call bell/phone within reach;with bed alarm set Nurse Communication: Mobility status;Other (comment) PT Visit Diagnosis: Unsteadiness on feet (R26.81);Repeated falls (R29.6)     Time: 7517-0017 PT Time Calculation (min) (ACUTE ONLY): 24 min  Charges:  $Gait Training: 23-37 mins                    Verdene Lennert, PT, DPT  Acute Rehabilitation Ortho Tech Supervisor [phone] pager [phone]) [phone] office

## 2021-03-26 NOTE — Progress Notes (Signed)
[facility] PROGRESS NOTE    Progress Note  Amanda [name]  GQQ:[id number] DOB: 1930/11/18 DOA: 03/11/2021 PCP: [name], DO     Brief Narrative:   Amanda [name] is an 87 y.o. female past medical history significant for prior stroke, dyslipidemia essential hypertension advanced dementia comes into the ED after a significant fall with facial contusion and ecchymosis.  In the ED CT of the head without contrast was done that showed acute bifrontal hemorrhage contusion and small volume subarachnoid hemorrhage CT of the C-spine and maxillofacial showed no fracture or dislocation, the case was discussed with neurosurgery who recommended repeat CT scan on 03/12/2021.  She was also started on IV Keppra and controlling her blood pressure for MAP of 70. The case was discussed with neurosurgery who recommended to repeat a CT scan on 03/12/2021 that showed stable bilateral frontal lobe hemorrhagic intraparenchymal confusion associated edema but no mass-effect.No new infarcts.  Awaiting insurance authorization Assessment/Plan:   Traumatic intracranial hemorrhage: Likely due to recurrent falls, the patient has had several falls at home. Physical therapy evaluated the patient recommended skilled nursing facility. Discharge planning is awaiting insurance authorization.  Advance dementia/frequent falls: Patient lives with his son despite this she continues to have recurrent falls. She is at high risk of acute confusional state and aspiration pneumonia. Use Haldol IV as needed for agitation. Cont keppra.  Essential hypertension: Continue Norvasc and Coreg pressures been relatively well controlled MAP has been between 100-90.  History of prior CVAs: Holding aspirin due to [place] continue Lipitor.  Ethics/goals of care: Patient is a DNR.  Chronic diastolic heart failure : with an EF of 70% and grade 1 diastolic heart failure, she appears to be compensated continue Coreg. Diuretics have been  discontinued.  Acute kidney injury: Baseline creatinine blood around 0.9, recheck is 1.2, likely hemodynamically mediated. Give a bolus recheck tomorrow morning.   DVT prophylaxis: scd Family Communication:son Status is: Observation  The patient remains OBS appropriate and will d/c before 2 midnights.  Dispo: The patient is from: Home              Anticipated d/c is to: SNF              Patient currently is not medically stable to d/c.   Difficult to place patient No   Code Status:     Code Status Orders  (From admission, onward)           Start     Ordered   03/11/21 1615  Do not attempt resuscitation (DNR)  Continuous       Question Answer Comment  In the event of cardiac or respiratory ARREST Do not call a "code blue"   In the event of cardiac or respiratory ARREST Do not perform Intubation, CPR, defibrillation or ACLS   In the event of cardiac or respiratory ARREST Use medication by any route, position, wound care, and other measures to relive pain and suffering. May use oxygen, suction and manual treatment of airway obstruction as needed for comfort.   Comments D/w Son and Daughter      03/11/21 1616           Code Status History     Date Active Date Inactive Code Status Order ID Comments User Context   03/11/2021 1138 03/11/2021 1616 DNR [id number]  Carmin Muskrat, MD ED   12/31/2020 2305 01/01/2021 2218 Full Code [id number]  Bethena Roys, MD ED   04/09/2013 1918 04/11/2013 1559 Full  Code [id number]  Hosie Poisson, MD Inpatient         IV Access:   Peripheral IV   Procedures and diagnostic studies:   No results found.   Medical Consultants:   None.   Subjective:    Amanda [name] has no complaints this morning. Objective:    Vitals:   03/25/21 2029 03/26/21 0021 03/26/21 0408 03/26/21 0758  BP: (!) 111/51 127/68 138/76 (!) 131/59  Pulse: 71 72 68 61  Resp: 16 15 15    Temp: 97.7 F (36.5 C) 97.9 F (36.6 C) 97.7 F (36.5 C) 98.2  F (36.8 C)  TempSrc: Oral Oral Oral Oral  SpO2: 95% 96% 95% 95%  Weight:      Height:       SpO2: 95 %   Intake/Output Summary (Last 24 hours) at 03/26/2021 0817 Last data filed at 03/26/2021 0600 Gross per 24 hour  Intake --  Output 1779 ml  Net -1779 ml    Filed Weights   03/11/21 0737  Weight: 60 kg    Exam: General exam: In no acute distress. Respiratory system: Good air movement and clear to auscultation. Cardiovascular system: S1 & S2 heard, RRR. No JVD. Gastrointestinal system: Abdomen is nondistended, soft and nontender.  Extremities: No pedal edema. Skin: No rashes, lesions or ulcers  Data Reviewed:    Labs: Basic Metabolic Panel: Recent Labs  Lab 03/23/21 0355 03/25/21 0211  NA 140 138  K 3.9 4.0  CL 105 104  CO2 25 27  GLUCOSE 109* 133*  BUN 44* 38*  CREATININE 1.22* 1.09*  CALCIUM 11.8* 11.6*    GFR Estimated Creatinine Clearance: 29.4 mL/min (A) (by C-G formula based on SCr of 1.09 mg/dL (H)). Liver Function Tests: No results for input(s): AST, ALT, ALKPHOS, BILITOT, PROT, ALBUMIN in the last 168 hours. No results for input(s): LIPASE, AMYLASE in the last 168 hours. No results for input(s): AMMONIA in the last 168 hours. Coagulation profile No results for input(s): INR, PROTIME in the last 168 hours. COVID-19 Labs  No results for input(s): DDIMER, FERRITIN, LDH, CRP in the last 72 hours.  Lab Results  Component Value Date   SARSCOV2NAA NEGATIVE 03/11/2021   [place] NEGATIVE 12/31/2020   [place] NEGATIVE 04/17/2019    CBC: Recent Labs  Lab 03/23/21 0355  WBC 11.7*  HGB 11.8*  HCT 36.1  MCV 94.5  PLT 149*    Cardiac Enzymes: No results for input(s): CKTOTAL, CKMB, CKMBINDEX, TROPONINI in the last 168 hours. BNP (last 3 results) No results for input(s): PROBNP in the last 8760 hours. CBG: Recent Labs  Lab 03/21/21 0805  GLUCAP 103*    D-Dimer: No results for input(s): DDIMER in the last 72 hours. Hgb  A1c: No results for input(s): HGBA1C in the last 72 hours. Lipid Profile: No results for input(s): CHOL, HDL, LDLCALC, TRIG, CHOLHDL, LDLDIRECT in the last 72 hours. Thyroid function studies: No results for input(s): TSH, T4TOTAL, T3FREE, THYROIDAB in the last 72 hours.  Invalid input(s): FREET3 Anemia work up: No results for input(s): VITAMINB12, FOLATE, FERRITIN, TIBC, IRON, RETICCTPCT in the last 72 hours. Sepsis Labs: Recent Labs  Lab 03/23/21 0355  WBC 11.7*    Microbiology No results found for this or any previous visit (from the past 240 hour(s)).    Medications:    atorvastatin  20 mg Oral QHS   carvedilol  3.125 mg Oral BID WC   darifenacin  7.5 mg Oral Daily   feeding supplement  237 mL Oral BID BM   iron polysaccharides  150 mg Oral Daily   pantoprazole  40 mg Oral Daily   sodium chloride flush  3 mL Intravenous Q12H   sodium chloride flush  3 mL Intravenous Q12H   verapamil  180 mg Oral QHS   Continuous Infusions:  sodium chloride 250 mL (03/11/21 2033)      LOS: 14 days   Charlynne Cousins  [facility]  03/26/2021, 8:17 AM

## 2021-03-26 NOTE — Progress Notes (Signed)
Pt has voided 138mls tonight. Bladder/ abdomen noted distended. Bladder scan shows greater than 968mls. Pt then peed a small amount and stating her pee was burning. Bladder scanned now showing 91mls.

## 2021-03-26 NOTE — TOC Progression Note (Signed)
Transition of Care [facility]) - Progression Note    Patient Details  Name: Amanda [name] MRN: [id number] Date of Birth: 1931-02-20  Transition of Care [facility]) CM/SW [place], [place] Phone Number: 03/26/2021, 1:22 PM  Clinical Narrative:   CSW spoke with [place] representative and confirmed that the appeal was received. A determination will be made within 72 hours. CSW to follow.    Expected Discharge Plan: [place] Barriers to Discharge: Continued Medical Work up, Ship broker  Expected Discharge Plan and Services Expected Discharge Plan: [place] Choice: [place] arrangements for the past 2 months: Single Family Home                                       Social Determinants of Health (SDOH) Interventions    Readmission Risk Interventions No flowsheet data found.

## 2021-03-26 NOTE — TOC Progression Note (Signed)
Transition of Care [facility]) - Progression Note    Patient Details  Name: Amanda [name] MRN: [id number] Date of Birth: August 28, 1930  Transition of Care [facility]) CM/SW [place], [place] Phone Number: 03/26/2021, 1:21 PM  Clinical Narrative:   [facility] [place] had not received previous appeal paperwork. CSW faxed appeal paperwork again with updated therapy notes. Awaiting determination.    Expected Discharge Plan: [place] Barriers to Discharge: Continued Medical Work up, Ship broker  Expected Discharge Plan and Services Expected Discharge Plan: [place] Choice: [place] arrangements for the past 2 months: Single Family Home                                       Social Determinants of Health (SDOH) Interventions    Readmission Risk Interventions No flowsheet data found.

## 2021-03-26 NOTE — Progress Notes (Signed)
Patient has not had any urine output this shift. Bladder scan read 239 mL at 1226. MD notified who stated not to do another in and out cath. Repeat bladder scan read 346 mL at 1615, and 540 mL at 1800. MD messaged and paged and awaiting response. Patient not in any distress at this time.

## 2021-03-27 MED ORDER — CHLORHEXIDINE GLUCONATE CLOTH 2 % EX PADS
6.0000 | MEDICATED_PAD | Freq: Every day | CUTANEOUS | Status: DC
  Administered 2021-03-27 – 2021-03-29 (×2): 6 via TOPICAL

## 2021-03-27 NOTE — Plan of Care (Signed)
Pt is alert x 1. Pt did not void all day shift 10/21. Pt bladder scan checked at 2300 and pt had 744mls in bladder from I&O cath. At 0400 pt had 564mls from I&O cath. Pt has verbalized that she can not pee and has tried multiple times. Pt c/o itching in her perineal area, some redness noted. White, milky drainage noted from her vaginal area. Pt resting.   Problem: Education: Goal: Knowledge of General Education information will improve Description: Including pain rating scale, medication(s)/side effects and non-pharmacologic comfort measures Outcome: Progressing   Problem: Health Behavior/Discharge Planning: Goal: Ability to manage health-related needs will improve Outcome: Progressing   Problem: Clinical Measurements: Goal: Ability to maintain clinical measurements within normal limits will improve Outcome: Progressing Goal: Will remain free from infection Outcome: Progressing Goal: Diagnostic test results will improve Outcome: Progressing Goal: Respiratory complications will improve Outcome: Progressing Goal: Cardiovascular complication will be avoided Outcome: Progressing   Problem: Activity: Goal: Risk for activity intolerance will decrease Outcome: Progressing   Problem: Nutrition: Goal: Adequate nutrition will be maintained Outcome: Progressing   Problem: Coping: Goal: Level of anxiety will decrease Outcome: Progressing   Problem: Elimination: Goal: Will not experience complications related to bowel motility Outcome: Progressing Goal: Will not experience complications related to urinary retention Outcome: Progressing   Problem: Pain Managment: Goal: General experience of comfort will improve Outcome: Progressing   Problem: Safety: Goal: Ability to remain free from injury will improve Outcome: Progressing   Problem: Skin Integrity: Goal: Risk for impaired skin integrity will decrease Outcome: Progressing

## 2021-03-27 NOTE — Progress Notes (Signed)
Provider informed that pt continues to have retention and new noted white, milky vaginal drainage.

## 2021-03-27 NOTE — Progress Notes (Signed)
[facility] PROGRESS NOTE    Progress Note  Amanda [name]  GGE:[id number] DOB: 1931-04-22 DOA: 03/11/2021 PCP: [name], DO     Brief Narrative:   Amanda [name] is an 87 y.o. female past medical history significant for prior stroke, dyslipidemia essential hypertension advanced dementia comes into the ED after a significant fall with facial contusion and ecchymosis.  In the ED CT of the head without contrast was done that showed acute bifrontal hemorrhage contusion and small volume subarachnoid hemorrhage CT of the C-spine and maxillofacial showed no fracture or dislocation, the case was discussed with neurosurgery who recommended repeat CT scan on 03/12/2021.  She was also started on IV Keppra and controlling her blood pressure for MAP of 70. The case was discussed with neurosurgery who recommended to repeat a CT scan on 03/12/2021 that showed stable bilateral frontal lobe hemorrhagic intraparenchymal confusion associated edema but no mass-effect.No new infarcts.  Awaiting insurance authorization Assessment/Plan:   Traumatic intracranial hemorrhage: Likely due to recurrent falls, the patient has had several falls at home. Physical therapy evaluated the patient recommended skilled nursing facility. Discharge planning is awaiting insurance authorization.  Advance dementia/frequent falls: Patient lives with his son despite this she continues to have recurrent falls. She is at high risk of acute confusional state and aspiration pneumonia. Use Haldol IV as needed for agitation. Cont keppra.  Essential hypertension: Continue Norvasc and Coreg pressures been relatively well controlled MAP has been between 70-100  History of prior CVAs: Holding aspirin due to [place] continue Lipitor.  Ethics/goals of care: Patient is a DNR.  Chronic diastolic heart failure : with an EF of 70% and grade 1 diastolic heart failure, she appears to be compensated continue Coreg. Diuretics have been  discontinued.  Acute kidney injury: Baseline creatinine blood around 0.9, recheck is 1.2, likely hemodynamically mediated. Give a bolus recheck tomorrow morning.  Acute urinary retention: Having trouble urinating, will place Foley.   DVT prophylaxis: scd Family Communication:son Status is: Observation  The patient remains OBS appropriate and will d/c before 2 midnights.  Dispo: The patient is from: Home              Anticipated d/c is to: SNF              Patient currently is not medically stable to d/c.   Difficult to place patient No   Code Status:     Code Status Orders  (From admission, onward)           Start     Ordered   03/11/21 1615  Do not attempt resuscitation (DNR)  Continuous       Question Answer Comment  In the event of cardiac or respiratory ARREST Do not call a "code blue"   In the event of cardiac or respiratory ARREST Do not perform Intubation, CPR, defibrillation or ACLS   In the event of cardiac or respiratory ARREST Use medication by any route, position, wound care, and other measures to relive pain and suffering. May use oxygen, suction and manual treatment of airway obstruction as needed for comfort.   Comments D/w Son and Daughter      03/11/21 1616           Code Status History     Date Active Date Inactive Code Status Order ID Comments User Context   03/11/2021 1138 03/11/2021 1616 DNR [id number]  Carmin Muskrat, MD ED   12/31/2020 2305 01/01/2021 2218 Full Code [id number]  Emokpae, [place]  E, MD ED   04/09/2013 1918 04/11/2013 1559 Full Code [id number]  Hosie Poisson, MD Inpatient         IV Access:   Peripheral IV   Procedures and diagnostic studies:   No results found.   Medical Consultants:   None.   Subjective:    [name] has no new complaints this morning Objective:    Vitals:   03/26/21 1928 03/26/21 2343 03/27/21 0320 03/27/21 0828  BP: (!) 115/55 (!) 110/53 139/66 (!) 111/59  Pulse: 71 68 61 (!) 59   Resp: (!) 21 20 19 18   Temp: 98.6 F (37 C) 97.9 F (36.6 C) 97.9 F (36.6 C) 97.9 F (36.6 C)  TempSrc: Axillary Oral Oral Oral  SpO2: 93% 90% 92% 98%  Weight:      Height:       SpO2: 98 %   Intake/Output Summary (Last 24 hours) at 03/27/2021 0946 Last data filed at 03/27/2021 0830 Gross per 24 hour  Intake 600 ml  Output 1225 ml  Net -625 ml    Filed Weights   03/11/21 0737  Weight: 60 kg    Exam: General exam: In no acute distress. Respiratory system: Good air movement and clear to auscultation. Cardiovascular system: S1 & S2 heard, RRR. No JVD. Gastrointestinal system: Abdomen is nondistended, soft and nontender.  Extremities: No pedal edema. Skin: No rashes, lesions or ulcers  Data Reviewed:    Labs: Basic Metabolic Panel: Recent Labs  Lab 03/23/21 0355 03/25/21 0211  NA 140 138  K 3.9 4.0  CL 105 104  CO2 25 27  GLUCOSE 109* 133*  BUN 44* 38*  CREATININE 1.22* 1.09*  CALCIUM 11.8* 11.6*    GFR Estimated Creatinine Clearance: 29.4 mL/min (A) (by C-G formula based on SCr of 1.09 mg/dL (H)). Liver Function Tests: No results for input(s): AST, ALT, ALKPHOS, BILITOT, PROT, ALBUMIN in the last 168 hours. No results for input(s): LIPASE, AMYLASE in the last 168 hours. No results for input(s): AMMONIA in the last 168 hours. Coagulation profile No results for input(s): INR, PROTIME in the last 168 hours. COVID-19 Labs  No results for input(s): DDIMER, FERRITIN, LDH, CRP in the last 72 hours.  Lab Results  Component Value Date   SARSCOV2NAA NEGATIVE 03/11/2021   [place] NEGATIVE 12/31/2020   [place] NEGATIVE 04/17/2019    CBC: Recent Labs  Lab 03/23/21 0355  WBC 11.7*  HGB 11.8*  HCT 36.1  MCV 94.5  PLT 149*    Cardiac Enzymes: No results for input(s): CKTOTAL, CKMB, CKMBINDEX, TROPONINI in the last 168 hours. BNP (last 3 results) No results for input(s): PROBNP in the last 8760 hours. CBG: Recent Labs  Lab  03/21/21 0805  GLUCAP 103*    D-Dimer: No results for input(s): DDIMER in the last 72 hours. Hgb A1c: No results for input(s): HGBA1C in the last 72 hours. Lipid Profile: No results for input(s): CHOL, HDL, LDLCALC, TRIG, CHOLHDL, LDLDIRECT in the last 72 hours. Thyroid function studies: No results for input(s): TSH, T4TOTAL, T3FREE, THYROIDAB in the last 72 hours.  Invalid input(s): FREET3 Anemia work up: No results for input(s): VITAMINB12, FOLATE, FERRITIN, TIBC, IRON, RETICCTPCT in the last 72 hours. Sepsis Labs: Recent Labs  Lab 03/23/21 0355  WBC 11.7*    Microbiology No results found for this or any previous visit (from the past 240 hour(s)).    Medications:    atorvastatin  20 mg Oral QHS   carvedilol  3.125 mg Oral  BID WC   darifenacin  7.5 mg Oral Daily   feeding supplement  237 mL Oral BID BM   iron polysaccharides  150 mg Oral Daily   pantoprazole  40 mg Oral Daily   sodium chloride flush  3 mL Intravenous Q12H   sodium chloride flush  3 mL Intravenous Q12H   verapamil  180 mg Oral QHS   Continuous Infusions:  sodium chloride 250 mL (03/11/21 2033)      LOS: 15 days   Charlynne Cousins  [facility]  03/27/2021, 9:46 AM

## 2021-03-28 DIAGNOSIS — S0636AA Traumatic hemorrhage of cerebrum, unspecified, with loss of consciousness status unknown, initial encounter: Secondary | ICD-10-CM

## 2021-03-28 NOTE — Progress Notes (Signed)
[facility] PROGRESS NOTE    Progress Note  Amanda [name]  TIR:[id number] DOB: 1931/02/24 DOA: 03/11/2021 PCP: [name], DO     Brief Narrative:   Amanda [name] is an 87 y.o. female past medical history significant for prior stroke, dyslipidemia essential hypertension advanced dementia comes into the ED after a significant fall with facial contusion and ecchymosis.  In the ED CT of the head without contrast was done that showed acute bifrontal hemorrhage contusion and small volume subarachnoid hemorrhage CT of the C-spine and maxillofacial showed no fracture or dislocation, the case was discussed with neurosurgery who recommended repeat CT scan on 03/12/2021.  She was also started on IV Keppra and controlling her blood pressure for MAP of 70. The case was discussed with neurosurgery who recommended to repeat a CT scan on 03/12/2021 that showed stable bilateral frontal lobe hemorrhagic intraparenchymal confusion associated edema but no mass-effect.No new infarcts.  Awaiting insurance authorization Assessment/Plan:   Traumatic intracranial hemorrhage: Likely due to recurrent falls, the patient has had several falls at home. Physical therapy evaluated the patient recommended skilled nursing facility. Discharge planning is awaiting insurance authorization.  Advance dementia/frequent falls: Patient lives with his son despite this she continues to have recurrent falls. She is at high risk of acute confusional state and aspiration pneumonia. Use Haldol IV as needed for agitation. Cont keppra.  Essential hypertension: Continue Norvasc and Coreg pressures been relatively well controlled MAP has been between 70-100  History of prior CVAs: Holding aspirin due to [place] continue Lipitor.  Ethics/goals of care: Patient is a DNR.  Chronic diastolic heart failure : with an EF of 70% and grade 1 diastolic heart failure, she appears to be compensated continue Coreg. Diuretics have been  discontinued.  Acute kidney injury: Baseline creatinine blood around 0.9, recheck is 1.2, likely hemodynamically mediated. Give a bolus recheck tomorrow morning.  Acute urinary retention: Having trouble urinating, will place Foley.   DVT prophylaxis: scd Family Communication:son Status is: Observation  The patient remains OBS appropriate and will d/c before 2 midnights.  Dispo: The patient is from: Home              Anticipated d/c is to: SNF              Patient currently is not medically stable to d/c.   Difficult to place patient No   Code Status:     Code Status Orders  (From admission, onward)           Start     Ordered   03/11/21 1615  Do not attempt resuscitation (DNR)  Continuous       Question Answer Comment  In the event of cardiac or respiratory ARREST Do not call a "code blue"   In the event of cardiac or respiratory ARREST Do not perform Intubation, CPR, defibrillation or ACLS   In the event of cardiac or respiratory ARREST Use medication by any route, position, wound care, and other measures to relive pain and suffering. May use oxygen, suction and manual treatment of airway obstruction as needed for comfort.   Comments D/w Son and Daughter      03/11/21 1616           Code Status History     Date Active Date Inactive Code Status Order ID Comments User Context   03/11/2021 1138 03/11/2021 1616 DNR [id number]  Carmin Muskrat, MD ED   12/31/2020 2305 01/01/2021 2218 Full Code [id number]  Emokpae, [place]  E, MD ED   04/09/2013 1918 04/11/2013 1559 Full Code [id number]  Hosie Poisson, MD Inpatient         IV Access:   Peripheral IV   Procedures and diagnostic studies:   No results found.   Medical Consultants:   None.   Subjective:    Amanda [name] she is had no new complaints this morning. Objective:    Vitals:   03/27/21 2008 03/27/21 2348 03/28/21 0323 03/28/21 0743  BP: 123/64 (!) 134/52 (!) 119/51 (!) 133/59  Pulse: 77 67 60  62  Resp: 18 17 18 17   Temp: 98.9 F (37.2 C) 97.8 F (36.6 C) 98 F (36.7 C) 97.6 F (36.4 C)  TempSrc: Oral Oral Oral Oral  SpO2: 93% 93% 98% 94%  Weight:      Height:       SpO2: 94 %   Intake/Output Summary (Last 24 hours) at 03/28/2021 0836 Last data filed at 03/28/2021 0324 Gross per 24 hour  Intake 300 ml  Output 1750 ml  Net -1450 ml    Filed Weights   03/11/21 0737  Weight: 60 kg    Exam: General exam: In no acute distress. Respiratory system: Good air movement and clear to auscultation. Cardiovascular system: S1 & S2 heard, RRR. No JVD. Gastrointestinal system: Abdomen is nondistended, soft and nontender.  Extremities: No pedal edema. Skin: No rashes, lesions or ulcers  Data Reviewed:    Labs: Basic Metabolic Panel: Recent Labs  Lab 03/23/21 0355 03/25/21 0211  NA 140 138  K 3.9 4.0  CL 105 104  CO2 25 27  GLUCOSE 109* 133*  BUN 44* 38*  CREATININE 1.22* 1.09*  CALCIUM 11.8* 11.6*    GFR Estimated Creatinine Clearance: 29.4 mL/min (A) (by C-G formula based on SCr of 1.09 mg/dL (H)). Liver Function Tests: No results for input(s): AST, ALT, ALKPHOS, BILITOT, PROT, ALBUMIN in the last 168 hours. No results for input(s): LIPASE, AMYLASE in the last 168 hours. No results for input(s): AMMONIA in the last 168 hours. Coagulation profile No results for input(s): INR, PROTIME in the last 168 hours. COVID-19 Labs  No results for input(s): DDIMER, FERRITIN, LDH, CRP in the last 72 hours.  Lab Results  Component Value Date   SARSCOV2NAA NEGATIVE 03/11/2021   [place] NEGATIVE 12/31/2020   [place] NEGATIVE 04/17/2019    CBC: Recent Labs  Lab 03/23/21 0355  WBC 11.7*  HGB 11.8*  HCT 36.1  MCV 94.5  PLT 149*    Cardiac Enzymes: No results for input(s): CKTOTAL, CKMB, CKMBINDEX, TROPONINI in the last 168 hours. BNP (last 3 results) No results for input(s): PROBNP in the last 8760 hours. CBG: No results for input(s): GLUCAP in  the last 168 hours.  D-Dimer: No results for input(s): DDIMER in the last 72 hours. Hgb A1c: No results for input(s): HGBA1C in the last 72 hours. Lipid Profile: No results for input(s): CHOL, HDL, LDLCALC, TRIG, CHOLHDL, LDLDIRECT in the last 72 hours. Thyroid function studies: No results for input(s): TSH, T4TOTAL, T3FREE, THYROIDAB in the last 72 hours.  Invalid input(s): FREET3 Anemia work up: No results for input(s): VITAMINB12, FOLATE, FERRITIN, TIBC, IRON, RETICCTPCT in the last 72 hours. Sepsis Labs: Recent Labs  Lab 03/23/21 0355  WBC 11.7*    Microbiology No results found for this or any previous visit (from the past 240 hour(s)).    Medications:    atorvastatin  20 mg Oral QHS   carvedilol  3.125 mg Oral BID  WC   Chlorhexidine Gluconate Cloth  6 each Topical Daily   darifenacin  7.5 mg Oral Daily   feeding supplement  237 mL Oral BID BM   iron polysaccharides  150 mg Oral Daily   pantoprazole  40 mg Oral Daily   sodium chloride flush  3 mL Intravenous Q12H   sodium chloride flush  3 mL Intravenous Q12H   verapamil  180 mg Oral QHS   Continuous Infusions:  sodium chloride 250 mL (03/11/21 2033)      LOS: 16 days   Charlynne Cousins  [facility]  03/28/2021, 8:36 AM

## 2021-03-29 DIAGNOSIS — S066XAD Traumatic subarachnoid hemorrhage with loss of consciousness status unknown, subsequent encounter: Secondary | ICD-10-CM | POA: Diagnosis not present

## 2021-03-29 DIAGNOSIS — D649 Anemia, unspecified: Secondary | ICD-10-CM | POA: Diagnosis not present

## 2021-03-29 DIAGNOSIS — R69 Illness, unspecified: Secondary | ICD-10-CM | POA: Diagnosis not present

## 2021-03-29 DIAGNOSIS — S066XAA Traumatic subarachnoid hemorrhage with loss of consciousness status unknown, initial encounter: Secondary | ICD-10-CM | POA: Diagnosis not present

## 2021-03-29 DIAGNOSIS — M6281 Muscle weakness (generalized): Secondary | ICD-10-CM | POA: Diagnosis not present

## 2021-03-29 DIAGNOSIS — R296 Repeated falls: Secondary | ICD-10-CM | POA: Diagnosis not present

## 2021-03-29 DIAGNOSIS — R4182 Altered mental status, unspecified: Secondary | ICD-10-CM | POA: Diagnosis not present

## 2021-03-29 DIAGNOSIS — I609 Nontraumatic subarachnoid hemorrhage, unspecified: Secondary | ICD-10-CM | POA: Diagnosis not present

## 2021-03-29 DIAGNOSIS — K219 Gastro-esophageal reflux disease without esophagitis: Secondary | ICD-10-CM | POA: Diagnosis not present

## 2021-03-29 DIAGNOSIS — N179 Acute kidney failure, unspecified: Secondary | ICD-10-CM | POA: Diagnosis not present

## 2021-03-29 DIAGNOSIS — F0390 Unspecified dementia without behavioral disturbance: Secondary | ICD-10-CM | POA: Diagnosis not present

## 2021-03-29 DIAGNOSIS — I5032 Chronic diastolic (congestive) heart failure: Secondary | ICD-10-CM | POA: Diagnosis not present

## 2021-03-29 DIAGNOSIS — I10 Essential (primary) hypertension: Secondary | ICD-10-CM | POA: Diagnosis not present

## 2021-03-29 DIAGNOSIS — I2510 Atherosclerotic heart disease of native coronary artery without angina pectoris: Secondary | ICD-10-CM | POA: Diagnosis not present

## 2021-03-29 DIAGNOSIS — E7800 Pure hypercholesterolemia, unspecified: Secondary | ICD-10-CM | POA: Diagnosis not present

## 2021-03-29 DIAGNOSIS — Z7401 Bed confinement status: Secondary | ICD-10-CM | POA: Diagnosis not present

## 2021-03-29 DIAGNOSIS — Z9181 History of falling: Secondary | ICD-10-CM | POA: Diagnosis not present

## 2021-03-29 DIAGNOSIS — Z743 Need for continuous supervision: Secondary | ICD-10-CM | POA: Diagnosis not present

## 2021-03-29 DIAGNOSIS — J189 Pneumonia, unspecified organism: Secondary | ICD-10-CM | POA: Diagnosis not present

## 2021-03-29 DIAGNOSIS — Z8673 Personal history of transient ischemic attack (TIA), and cerebral infarction without residual deficits: Secondary | ICD-10-CM | POA: Diagnosis not present

## 2021-03-29 DIAGNOSIS — E039 Hypothyroidism, unspecified: Secondary | ICD-10-CM | POA: Diagnosis not present

## 2021-03-29 DIAGNOSIS — S066X0D Traumatic subarachnoid hemorrhage without loss of consciousness, subsequent encounter: Secondary | ICD-10-CM | POA: Diagnosis not present

## 2021-03-29 DIAGNOSIS — E119 Type 2 diabetes mellitus without complications: Secondary | ICD-10-CM | POA: Diagnosis not present

## 2021-03-29 DIAGNOSIS — S066X0A Traumatic subarachnoid hemorrhage without loss of consciousness, initial encounter: Secondary | ICD-10-CM | POA: Diagnosis not present

## 2021-03-29 DIAGNOSIS — G319 Degenerative disease of nervous system, unspecified: Secondary | ICD-10-CM | POA: Diagnosis not present

## 2021-03-29 DIAGNOSIS — E785 Hyperlipidemia, unspecified: Secondary | ICD-10-CM | POA: Diagnosis not present

## 2021-03-29 DIAGNOSIS — S0636AA Traumatic hemorrhage of cerebrum, unspecified, with loss of consciousness status unknown, initial encounter: Secondary | ICD-10-CM | POA: Diagnosis not present

## 2021-03-29 DIAGNOSIS — E559 Vitamin D deficiency, unspecified: Secondary | ICD-10-CM | POA: Diagnosis not present

## 2021-03-29 DIAGNOSIS — R339 Retention of urine, unspecified: Secondary | ICD-10-CM | POA: Diagnosis not present

## 2021-03-29 DIAGNOSIS — D518 Other vitamin B12 deficiency anemias: Secondary | ICD-10-CM | POA: Diagnosis not present

## 2021-03-29 DIAGNOSIS — N189 Chronic kidney disease, unspecified: Secondary | ICD-10-CM | POA: Diagnosis not present

## 2021-03-29 DIAGNOSIS — Z79899 Other long term (current) drug therapy: Secondary | ICD-10-CM | POA: Diagnosis not present

## 2021-03-29 DIAGNOSIS — W19XXXA Unspecified fall, initial encounter: Secondary | ICD-10-CM | POA: Diagnosis not present

## 2021-03-29 DIAGNOSIS — Z87891 Personal history of nicotine dependence: Secondary | ICD-10-CM | POA: Diagnosis not present

## 2021-03-29 DIAGNOSIS — R918 Other nonspecific abnormal finding of lung field: Secondary | ICD-10-CM | POA: Diagnosis not present

## 2021-03-29 LAB — RESP PANEL BY RT-PCR (FLU A&B, COVID) ARPGX2
Influenza A by PCR: NEGATIVE
Influenza B by PCR: NEGATIVE
SARS Coronavirus 2 by RT PCR: NEGATIVE

## 2021-03-29 MED ORDER — ENSURE ENLIVE PO LIQD: 237.0000 mL | Freq: Two times a day (BID) | ORAL | 12 refills | Status: AC

## 2021-03-29 NOTE — Progress Notes (Signed)
Physical Therapy Treatment Patient Details Name: Amanda [name] MRN: [id number] DOB: 08-01-1930 Today's Date: 03/29/2021   History of Present Illness This 87 y.o. female admitted 03/11/21 after a significant fall with facial contusion. CT of head showed acute bifrontal hemorrhage, and small volume SAH.   PMH includes: Dementia, CAD, HTN, CVA.    PT Comments    Pt received in supine, agreeable to therapy session and oriented to self/situation somewhat. Pt c/o dizziness with transfers/seated and standing tasks and BP slightly soft 97/61 seated EOB. Emphasis on safety with bed mobility/seated posture due to impulsivity, safety with transfers especially safe hand placement, safe use of RW for pivotal transfers and importance of continued mobility. Session time limited due to arrival of PTAR for transport to post-acute setting. Pt continues to benefit from PT services to progress toward functional mobility goals.    Recommendations for follow up therapy are one component of a multi-disciplinary discharge planning process, led by the attending physician.  Recommendations may be updated based on patient status, additional functional criteria and insurance authorization.  Follow Up Recommendations  Skilled nursing-short term rehab (<3 hours/day)     Assistance Recommended at Discharge Frequent or constant Supervision/Assistance  Equipment Recommendations  Other (comment) (defer to post-acute)    Recommendations for Other Services       Precautions / Restrictions Precautions Precautions: Fall Precaution Comments: h/o falls per chart Restrictions Weight Bearing Restrictions: No     Mobility  Bed Mobility Overal bed mobility: Needs Assistance Bed Mobility: Supine to Sit Rolling: Min guard Sidelying to sit: Min assist       General bed mobility comments: Assistance with bil LE to bring to EOB, verbal/tactile cues for hand placement, use of bed rail/HOB elevation    Transfers Overall  transfer level: Needs assistance Equipment used: Rolling walker (2 wheels) Transfers: Sit to/from [facility] Sit to Stand: Min assist;+2 safety/equipment Stand pivot transfers: Min assist;+2 physical assistance (to bsc)         General transfer comment: Two person min assist to come standing, hand over hand assist with hand placement and to keep trunk upright, pt needs max postural cues once standing    Ambulation/Gait Ambulation/Gait assistance: Min assist;+2 safety/equipment Gait Distance (Feet): 4 Feet Assistive device: Rolling walker (2 wheels) Gait Pattern/deviations: Trunk flexed;Step-through pattern;Decreased stride length;Shuffle Gait velocity: Decreased   General Gait Details: Assist needed at trunk for balance, verbal cues to move walker ahead and max postural cues, pt with crouched posture     Modified Rankin (Stroke Patients Only) Modified Rankin (Stroke Patients Only) Pre-Morbid Rankin Score: Moderate disability Modified Rankin: Moderately severe disability     Balance Overall balance assessment: Needs assistance Sitting-balance support: Feet supported;Bilateral upper extremity supported Sitting balance-Leahy Scale: Fair Sitting balance - Comments: Close supervision for static sitting at EOB and variable min guard to minA trunk support for dynamic seated tasks including putting socks on   Standing balance support: Bilateral upper extremity supported Standing balance-Leahy Scale: Poor Standing balance comment: Requires modA from RW and therapist to maintain balance              Cognition Arousal/Alertness: Awake/alert Behavior During Therapy: Impulsive Overall Cognitive Status: Impaired/Different from baseline Area of Impairment: Orientation;Attention;Memory;Following commands;Safety/judgement;Awareness;Problem solving      Following Commands: Follows one step commands inconsistently;Follows one step commands with increased  time Safety/Judgement: Decreased awareness of safety;Decreased awareness of deficits   Problem Solving: Slow processing;Difficulty sequencing;Requires verbal cues;Requires tactile cues General Comments: Patient complained of dizziness  when seated EOB, seated BP 97/61           General Comments General comments (skin integrity, edema, etc.): BP 97/61 seated EOB      Pertinent Vitals/Pain Pain Assessment: Faces Faces Pain Scale: Hurts a little bit Pain Location: pt c/o discomfort during hygiene otherwise unable to localize Pain Descriptors / Indicators: Grimacing Pain Intervention(s): Limited activity within patient's tolerance;Monitored during session;Repositioned     PT Goals (current goals can now be found in the care plan section) Acute Rehab PT Goals Patient Stated Goal: pt unable to state -cognitive deficit PT Goal Formulation: Patient unable to participate in goal setting Time For Goal Achievement: 04/09/21 Progress towards PT goals: Progressing toward goals    Frequency    Min 3X/week      PT Plan Current plan remains appropriate       AM-PAC PT "6 Clicks" Mobility   Outcome Measure  Help needed turning from your back to your side while in a flat bed without using bedrails?: A Little Help needed moving from lying on your back to sitting on the side of a flat bed without using bedrails?: A Little Help needed moving to and from a bed to a chair (including a wheelchair)?: A Lot Help needed standing up from a chair using your arms (e.g., wheelchair or bedside chair)?: A Lot Help needed to walk in hospital room?: A Lot Help needed climbing 3-5 steps with a railing? : Total 6 Click Score: 13    End of Session Equipment Utilized During Treatment: Gait belt Activity Tolerance: Patient tolerated treatment well;Patient limited by fatigue Patient left: Other (comment);with call bell/phone within reach (on stretcher in care of RN and Teacher, adult education) Nurse  Communication: Mobility status;Other (comment) (needs IV removed for discharge, RN in room/aware) PT Visit Diagnosis: Unsteadiness on feet (R26.81);Repeated falls (R29.6)     Time: 6503-5465 PT Time Calculation (min) (ACUTE ONLY): 20 min  Charges:  $Therapeutic Activity: 8-22 mins                     Naira Standiford P., PTA Acute Rehabilitation Services Pager: [phone] Office: [place] 03/29/2021, 12:25 PM

## 2021-03-29 NOTE — Plan of Care (Signed)
  Problem: Education: Goal: Knowledge of General Education information will improve Description: Including pain rating scale, medication(s)/side effects and non-pharmacologic comfort measures Outcome: Adequate for Discharge   Problem: Clinical Measurements: Goal: Ability to maintain clinical measurements within normal limits will improve Outcome: Adequate for Discharge Goal: Will remain free from infection Outcome: Adequate for Discharge Goal: Diagnostic test results will improve Outcome: Adequate for Discharge Goal: Respiratory complications will improve Outcome: Adequate for Discharge Goal: Cardiovascular complication will be avoided Outcome: Adequate for Discharge   Problem: Safety: Goal: Ability to remain free from injury will improve Outcome: Adequate for Discharge   Problem: Pain Managment: Goal: General experience of comfort will improve Outcome: Adequate for Discharge   Problem: Skin Integrity: Goal: Risk for impaired skin integrity will decrease Outcome: Adequate for Discharge   Problem: Elimination: Goal: Will not experience complications related to bowel motility Outcome: Adequate for Discharge Goal: Will not experience complications related to urinary retention Outcome: Adequate for Discharge   Problem: Clinical Measurements: Goal: Ability to maintain clinical measurements within normal limits will improve Outcome: Adequate for Discharge Goal: Will remain free from infection Outcome: Adequate for Discharge Goal: Diagnostic test results will improve Outcome: Adequate for Discharge Goal: Respiratory complications will improve Outcome: Adequate for Discharge Goal: Cardiovascular complication will be avoided Outcome: Adequate for Discharge   Problem: Education: Goal: Knowledge of General Education information will improve Description: Including pain rating scale, medication(s)/side effects and non-pharmacologic comfort measures Outcome: Adequate for  Discharge   Problem: Health Behavior/Discharge Planning: Goal: Ability to manage health-related needs will improve Outcome: Adequate for Discharge

## 2021-03-29 NOTE — Progress Notes (Signed)
call  placed to SNF at 1115 to give report to receiving nurse with no success. Will try back.

## 2021-03-29 NOTE — TOC Transition Note (Signed)
Transition of Care [facility]) - CM/SW Discharge Note   Patient Details  Name: Amanda [name] MRN: [id number] Date of Birth: 1930/12/06  Transition of Care [facility] CM/SW Contact:  Geralynn Ochs, LCSW Phone Number: 03/29/2021, 10:41 AM   Clinical Narrative:   Nurse to call report to [phone], Rm [phone].    Final next level of care: Skilled Nursing Facility Barriers to Discharge: Barriers Resolved   Patient Goals and CMS Choice Patient states their goals for this hospitalization and ongoing recovery are:: patient unable to participate in goal setting, only oriented to self CMS Medicare.gov Compare Post Acute Care list provided to:: Patient Represenative (must comment) Choice offered to / list presented to : Adult Children  Discharge Placement              Patient [facility] bed at: [facility] Patient to be transferred to facility by: [place] Name of family member notified: Clarise Cruz, attempted to call Ulice Dash but no answer and unable to leave voicemail Patient and family notified of of transfer: 03/29/21  Discharge Plan and Services     Post Acute Care Choice: [place]                               Social Determinants of Health (SDOH) Interventions     Readmission Risk Interventions No flowsheet data found.

## 2021-03-29 NOTE — Discharge Summary (Addendum)
Physician Discharge Summary  Parminder T Forester WHQ:[id number] DOB: August 08, 1930 DOA: 03/11/2021  PCP: [name], DO  Admit date: 03/11/2021 Discharge date: 03/29/2021  Admitted From: Home (Home, ALF, ILF, SNF) Disposition:  SNF  Recommendations for Outpatient Follow-up:  Follow up with PCP in 1-2 weeks Please obtain BMP/CBC in one week Voiding trail in 1 week, follow up with urology.   Home Health:No Equipment/Devices:None  Discharge Condition:Stable CODE STATUS:DNR Diet recommendation: Heart Healthy   Brief/Interim Summary: 87 y.o. female past medical history significant for prior stroke, dyslipidemia essential hypertension advanced dementia comes into the ED after a significant fall with facial contusion and ecchymosis.  In the ED CT of the head without contrast was done that showed acute bifrontal hemorrhage contusion and small volume subarachnoid hemorrhage CT of the C-spine and maxillofacial showed no fracture or dislocation, the case was discussed with neurosurgery who recommended repeat CT scan on 03/12/2021.  She was also started on IV Keppra and controlling her blood pressure for MAP of 70. The case was discussed with neurosurgery who recommended to repeat a CT scan on 03/12/2021 that showed stable bilateral frontal lobe hemorrhagic intraparenchymal confusion associated edema but no mass-effect.No new infarcts.  Discharge Diagnoses:  Principal Problem:   Subarachnoid hematoma Active Problems:   Hyperlipidemia   History of CVA (cerebrovascular accident)   Cerebral atrophy (HCC)   Subarachnoid hemorrhage (HCC)  Traumatic intracranial hemorrhage due to recurrent falls neurosurgery: Likely due to recurrent falls patient has had several recurrent falls at home, the imaging showed no acute findings. Physical therapy evaluated the patient and recommended skilled nursing facility. She was transferred to skilled.  Advance dementia/frequent falls: Patient lives with son despite  this she continues to have recurrent falls as he cares for her. She was continued on Keppra for 7 days which she completed her treatment.  Essential hypertension: Continue Norvasc and Coreg blood pressure is well controlled no changes made.  History of CVA: Aspirin discontinued due to [place] continue Lipitor.  Follow-up with neurology as an outpatient 2 to 4 weeks they will dictate when to start aspirin.  Ethics/goals of care: The patient was made a DNR.  Chronic diastolic heart failure chronic/appears to be compensated, diuretics has been discontinued.  Acute kidney injury: Baseline creatinine around 0.9 on admission 1.2 she was started on IV fluids her creatinine returned to baseline.  Acute urinary retention: Having trouble urinating,  Foley placed, voiding trial in 2 weeks.Marland Kitchen  Discharge Instructions  Discharge Instructions     Diet - low sodium heart healthy   Complete by: As directed    Increase activity slowly   Complete by: As directed       Allergies as of 03/29/2021       Reactions   Livalo [pitavastatin] Other (See Comments)   Causes dizziness   Simvastatin Other (See Comments)   Causes dizziness   Lisinopril    Hallucinations, resolved on ARB        Medication List     STOP taking these medications    loratadine 10 MG tablet Commonly known as: CLARITIN   PreserVision AREDS 2 Caps       TAKE these medications    aspirin EC 81 MG tablet Take 1 tablet (81 mg total) by mouth daily with breakfast. Swallow whole.   feeding supplement Liqd Take 237 mLs by mouth 2 (two) times daily between meals.   iron polysaccharides 150 MG capsule Commonly known as: NIFEREX Take 150 mg by mouth daily.  Lipitor 20 MG tablet Generic drug: atorvastatin TAKE 1 TABLET AT BEDTIME.   omeprazole 20 MG capsule Commonly known as: PRILOSEC TAKE 1 CAPSULE (20 MG TOTAL) BY MOUTH 2 (TWO) TIMES DAILY BEFORE A MEAL.   solifenacin 5 MG tablet Commonly known as:  VESICARE Take 1 tablet (5 mg total) by mouth daily.   verapamil 180 MG CR tablet Commonly known as: CALAN-SR Take 1 tablet (180 mg total) by mouth at bedtime. What changed: See the new instructions.   VITAMIN D PO 1,000 Int'l Units/day.        Allergies  Allergen Reactions   Livalo [Pitavastatin] Other (See Comments)    Causes dizziness   Simvastatin Other (See Comments)    Causes dizziness   Lisinopril     Hallucinations, resolved on ARB    Consultations: Neurosurgery   Procedures/Studies: CT HEAD WO CONTRAST (5MM)  Result Date: 03/12/2021 CLINICAL DATA:  Head trauma, intracranial hemorrhage EXAM: CT HEAD WITHOUT CONTRAST TECHNIQUE: Contiguous axial images were obtained from the base of the skull through the vertex without intravenous contrast. COMPARISON:  03/11/2021 FINDINGS: Brain: Multiple areas of intraparenchymal hemorrhage are seen within the bilateral frontal lobes, not appreciably changed since prior study. Largest area of hemorrhage in the right frontal lobe measures up to 1.5 cm, grossly stable since prior study. There is mild surrounding edema. Small areas of subarachnoid hemorrhage are again seen within the anterior inter hemispheric fissure, left choroidal fissure, and left ambient cistern unchanged. No new areas of hemorrhage or infarct. Lateral ventricles and remaining midline structures are unremarkable. No mass effect. Vascular: No hyperdense vessel or unexpected calcification. Skull: Normal. Negative for fracture or focal lesion. Sinuses/Orbits: Minimal mucosal thickening left maxillary sinus unchanged. Remaining paranasal sinuses are clear. Other: None. IMPRESSION: 1. Stable bilateral frontal lobe hemorrhagic intraparenchymal contusions and associated edema. No mass effect. 2. Stable small volume subarachnoid hemorrhage. 3. No new infarct or additional hemorrhage. Electronically Signed   By: Randa Ngo M.D.   On: 03/12/2021 18:58   CT Head Wo  Contrast  Result Date: 03/11/2021 CLINICAL DATA:  Polytrauma, critical, head/C-spine injury suspected; Facial trauma. Fall. EXAM: CT HEAD WITHOUT CONTRAST CT MAXILLOFACIAL WITHOUT CONTRAST CT CERVICAL SPINE WITHOUT CONTRAST TECHNIQUE: Multidetector CT imaging of the head, cervical spine, and maxillofacial structures were performed using the standard protocol without intravenous contrast. Multiplanar CT image reconstructions of the cervical spine and maxillofacial structures were also generated. COMPARISON:  Head CT, head and neck CTA, and head MRI 12/31/2020 FINDINGS: CT HEAD FINDINGS Brain: There are multiple small acute parenchymal hemorrhages anteriorly in the frontal lobes measuring up to 1.9 cm in size with minimal surrounding edema consistent with hemorrhagic contusions with this history. There is a small amount of acute subarachnoid hemorrhage in the left choroidal fissure, left ambient cistern, and inter hemispheric fissure. No acute cortically based infarct, midline shift, or extra-axial fluid collection is identified. There is moderate cerebral atrophy. Patchy hypodensities in the cerebral white matter bilaterally are unchanged and nonspecific but compatible with moderate to severe chronic small vessel ischemic disease. Vascular: Calcified atherosclerosis at the skull base. No hyperdense vessel. Skull: No fracture or suspicious osseous lesion. Other: None. CT MAXILLOFACIAL FINDINGS Osseous: No acute fracture, mandibular dislocation, or suspicious osseous lesion. Edentulous. Orbits: Bilateral cataract extraction. Mild left periorbital soft tissue swelling. No retrobulbar hematoma. Sinuses: Scattered mild mucosal thickening in the paranasal sinuses. Small volume fluid in the left maxillary sinus. Clear mastoid air cells. Soft tissues: Left facial contusion. CT CERVICAL SPINE FINDINGS Alignment:  Chronic mild reversal of the normal cervical lordosis with grade 1 anterolisthesis C3 on C4 in C4 on C5 and grade  1 retrolisthesis of C5 on C6. Skull base and vertebrae: No acute fracture or suspicious osseous lesion. Median C1-2 arthropathy with mild ligamentous thickening and calcification about the dens. Soft tissues and spinal canal: No prevertebral fluid or swelling. No visible canal hematoma. Disc levels: Diffuse cervical disc degeneration, most severe from C5-6 to C7-T1. Advanced facet arthrosis in the upper cervical spine. Advanced neural foraminal stenosis on the left at C4-5 and on the right at C5-6. Upper chest: No apical lung consolidation or mass. Other: 8 mm right thyroid nodule for which no imaging follow-up is recommended. IMPRESSION: 1. Acute bifrontal hemorrhagic contusions and small volume subarachnoid hemorrhage. 2. No acute cervical spine or maxillofacial fracture. 3. Moderate to severe chronic small vessel ischemic disease. Critical Value/emergent results were called by telephone at the time of interpretation on 03/11/2021 at 11:28 am to Dr. Carmin Muskrat, who verbally acknowledged these results. Electronically Signed   By: Logan Bores M.D.   On: 03/11/2021 11:32   CT Cervical Spine Wo Contrast  Result Date: 03/11/2021 CLINICAL DATA:  Polytrauma, critical, head/C-spine injury suspected; Facial trauma. Fall. EXAM: CT HEAD WITHOUT CONTRAST CT MAXILLOFACIAL WITHOUT CONTRAST CT CERVICAL SPINE WITHOUT CONTRAST TECHNIQUE: Multidetector CT imaging of the head, cervical spine, and maxillofacial structures were performed using the standard protocol without intravenous contrast. Multiplanar CT image reconstructions of the cervical spine and maxillofacial structures were also generated. COMPARISON:  Head CT, head and neck CTA, and head MRI 12/31/2020 FINDINGS: CT HEAD FINDINGS Brain: There are multiple small acute parenchymal hemorrhages anteriorly in the frontal lobes measuring up to 1.9 cm in size with minimal surrounding edema consistent with hemorrhagic contusions with this history. There is a small amount  of acute subarachnoid hemorrhage in the left choroidal fissure, left ambient cistern, and inter hemispheric fissure. No acute cortically based infarct, midline shift, or extra-axial fluid collection is identified. There is moderate cerebral atrophy. Patchy hypodensities in the cerebral white matter bilaterally are unchanged and nonspecific but compatible with moderate to severe chronic small vessel ischemic disease. Vascular: Calcified atherosclerosis at the skull base. No hyperdense vessel. Skull: No fracture or suspicious osseous lesion. Other: None. CT MAXILLOFACIAL FINDINGS Osseous: No acute fracture, mandibular dislocation, or suspicious osseous lesion. Edentulous. Orbits: Bilateral cataract extraction. Mild left periorbital soft tissue swelling. No retrobulbar hematoma. Sinuses: Scattered mild mucosal thickening in the paranasal sinuses. Small volume fluid in the left maxillary sinus. Clear mastoid air cells. Soft tissues: Left facial contusion. CT CERVICAL SPINE FINDINGS Alignment: Chronic mild reversal of the normal cervical lordosis with grade 1 anterolisthesis C3 on C4 in C4 on C5 and grade 1 retrolisthesis of C5 on C6. Skull base and vertebrae: No acute fracture or suspicious osseous lesion. Median C1-2 arthropathy with mild ligamentous thickening and calcification about the dens. Soft tissues and spinal canal: No prevertebral fluid or swelling. No visible canal hematoma. Disc levels: Diffuse cervical disc degeneration, most severe from C5-6 to C7-T1. Advanced facet arthrosis in the upper cervical spine. Advanced neural foraminal stenosis on the left at C4-5 and on the right at C5-6. Upper chest: No apical lung consolidation or mass. Other: 8 mm right thyroid nodule for which no imaging follow-up is recommended. IMPRESSION: 1. Acute bifrontal hemorrhagic contusions and small volume subarachnoid hemorrhage. 2. No acute cervical spine or maxillofacial fracture. 3. Moderate to severe chronic small vessel  ischemic disease. Critical Value/emergent results were  called by telephone at the time of interpretation on 03/11/2021 at 11:28 am to Dr. Carmin Muskrat, who verbally acknowledged these results. Electronically Signed   By: Logan Bores M.D.   On: 03/11/2021 11:32   CT Maxillofacial WO CM  Result Date: 03/11/2021 CLINICAL DATA:  Polytrauma, critical, head/C-spine injury suspected; Facial trauma. Fall. EXAM: CT HEAD WITHOUT CONTRAST CT MAXILLOFACIAL WITHOUT CONTRAST CT CERVICAL SPINE WITHOUT CONTRAST TECHNIQUE: Multidetector CT imaging of the head, cervical spine, and maxillofacial structures were performed using the standard protocol without intravenous contrast. Multiplanar CT image reconstructions of the cervical spine and maxillofacial structures were also generated. COMPARISON:  Head CT, head and neck CTA, and head MRI 12/31/2020 FINDINGS: CT HEAD FINDINGS Brain: There are multiple small acute parenchymal hemorrhages anteriorly in the frontal lobes measuring up to 1.9 cm in size with minimal surrounding edema consistent with hemorrhagic contusions with this history. There is a small amount of acute subarachnoid hemorrhage in the left choroidal fissure, left ambient cistern, and inter hemispheric fissure. No acute cortically based infarct, midline shift, or extra-axial fluid collection is identified. There is moderate cerebral atrophy. Patchy hypodensities in the cerebral white matter bilaterally are unchanged and nonspecific but compatible with moderate to severe chronic small vessel ischemic disease. Vascular: Calcified atherosclerosis at the skull base. No hyperdense vessel. Skull: No fracture or suspicious osseous lesion. Other: None. CT MAXILLOFACIAL FINDINGS Osseous: No acute fracture, mandibular dislocation, or suspicious osseous lesion. Edentulous. Orbits: Bilateral cataract extraction. Mild left periorbital soft tissue swelling. No retrobulbar hematoma. Sinuses: Scattered mild mucosal thickening in  the paranasal sinuses. Small volume fluid in the left maxillary sinus. Clear mastoid air cells. Soft tissues: Left facial contusion. CT CERVICAL SPINE FINDINGS Alignment: Chronic mild reversal of the normal cervical lordosis with grade 1 anterolisthesis C3 on C4 in C4 on C5 and grade 1 retrolisthesis of C5 on C6. Skull base and vertebrae: No acute fracture or suspicious osseous lesion. Median C1-2 arthropathy with mild ligamentous thickening and calcification about the dens. Soft tissues and spinal canal: No prevertebral fluid or swelling. No visible canal hematoma. Disc levels: Diffuse cervical disc degeneration, most severe from C5-6 to C7-T1. Advanced facet arthrosis in the upper cervical spine. Advanced neural foraminal stenosis on the left at C4-5 and on the right at C5-6. Upper chest: No apical lung consolidation or mass. Other: 8 mm right thyroid nodule for which no imaging follow-up is recommended. IMPRESSION: 1. Acute bifrontal hemorrhagic contusions and small volume subarachnoid hemorrhage. 2. No acute cervical spine or maxillofacial fracture. 3. Moderate to severe chronic small vessel ischemic disease. Critical Value/emergent results were called by telephone at the time of interpretation on 03/11/2021 at 11:28 am to Dr. Carmin Muskrat, who verbally acknowledged these results. Electronically Signed   By: Logan Bores M.D.   On: 03/11/2021 11:32   (Echo, Carotid, EGD, Colonoscopy, ERCP)    Subjective: No complaints  Discharge Exam: Vitals:   03/29/21 0509 03/29/21 0828  BP: 126/60 131/70  Pulse: 64 (!) 58  Resp: 18 18  Temp: (!) 97.4 F (36.3 C) 97.9 F (36.6 C)  SpO2: 99% 94%   Vitals:   03/28/21 2021 03/29/21 0026 03/29/21 0509 03/29/21 0828  BP: 115/65 130/74 126/60 131/70  Pulse: 72 83 64 (!) 58  Resp: 16 16 18 18   Temp: 97.6 F (36.4 C) (!) 97.5 F (36.4 C) (!) 97.4 F (36.3 C) 97.9 F (36.6 C)  TempSrc: Oral Oral Oral Oral  SpO2: 97% 95% 99% 94%  Weight:  Height:         General: Pt is alert, awake, not in acute distress Cardiovascular: RRR, S1/S2 +, no rubs, no gallops Respiratory: CTA bilaterally, no wheezing, no rhonchi Abdominal: Soft, NT, ND, bowel sounds + Extremities: no edema, no cyanosis    The results of significant diagnostics from this hospitalization (including imaging, microbiology, ancillary and laboratory) are listed below for reference.     Microbiology: No results found for this or any previous visit (from the past 240 hour(s)).   Labs: BNP (last 3 results) No results for input(s): BNP in the last 8760 hours. Basic Metabolic Panel: Recent Labs  Lab 03/23/21 0355 03/25/21 0211  NA 140 138  K 3.9 4.0  CL 105 104  CO2 25 27  GLUCOSE 109* 133*  BUN 44* 38*  CREATININE 1.22* 1.09*  CALCIUM 11.8* 11.6*   Liver Function Tests: No results for input(s): AST, ALT, ALKPHOS, BILITOT, PROT, ALBUMIN in the last 168 hours. No results for input(s): LIPASE, AMYLASE in the last 168 hours. No results for input(s): AMMONIA in the last 168 hours. CBC: Recent Labs  Lab 03/23/21 0355  WBC 11.7*  HGB 11.8*  HCT 36.1  MCV 94.5  PLT 149*   Cardiac Enzymes: No results for input(s): CKTOTAL, CKMB, CKMBINDEX, TROPONINI in the last 168 hours. BNP: Invalid input(s): POCBNP CBG: No results for input(s): GLUCAP in the last 168 hours. D-Dimer No results for input(s): DDIMER in the last 72 hours. Hgb A1c No results for input(s): HGBA1C in the last 72 hours. Lipid Profile No results for input(s): CHOL, HDL, LDLCALC, TRIG, CHOLHDL, LDLDIRECT in the last 72 hours. Thyroid function studies No results for input(s): TSH, T4TOTAL, T3FREE, THYROIDAB in the last 72 hours.  Invalid input(s): FREET3 Anemia work up No results for input(s): VITAMINB12, FOLATE, FERRITIN, TIBC, IRON, RETICCTPCT in the last 72 hours. Urinalysis    Component Value Date/Time   COLORURINE YELLOW 03/26/2021 0535   APPEARANCEUR HAZY (A) 03/26/2021 0535    APPEARANCEUR Clear 03/19/2020 0930   LABSPEC 1.012 03/26/2021 0535   PHURINE 7.0 03/26/2021 0535   GLUCOSEU NEGATIVE 03/26/2021 0535   HGBUR SMALL (A) 03/26/2021 0535   BILIRUBINUR NEGATIVE 03/26/2021 0535   BILIRUBINUR Negative 03/19/2020 0930   KETONESUR NEGATIVE 03/26/2021 0535   PROTEINUR NEGATIVE 03/26/2021 0535   UROBILINOGEN 0.2 12/18/2019 1009   UROBILINOGEN 1.0 04/10/2013 2115   NITRITE NEGATIVE 03/26/2021 0535   LEUKOCYTESUR LARGE (A) 03/26/2021 0535   Sepsis Labs Invalid input(s): PROCALCITONIN,  WBC,  LACTICIDVEN Microbiology No results found for this or any previous visit (from the past 240 hour(s)).  SIGNED:   Charlynne Cousins, MD  [facility] 03/29/2021, 9:39 AM Pager   If 7PM-7AM, please contact night-coverage [email or web address] Password [email or web address]

## 2021-03-29 NOTE — Progress Notes (Signed)
[facility] PROGRESS NOTE    Progress Note  Amanda [name]  MCN:[id number] DOB: 02/28/1931 DOA: 03/11/2021 PCP: [name], DO     Brief Narrative:   Amanda [name] is an 87 y.o. female past medical history significant for prior stroke, dyslipidemia essential hypertension advanced dementia comes into the ED after a significant fall with facial contusion and ecchymosis.  In the ED CT of the head without contrast was done that showed acute bifrontal hemorrhage contusion and small volume subarachnoid hemorrhage CT of the C-spine and maxillofacial showed no fracture or dislocation, the case was discussed with neurosurgery who recommended repeat CT scan on 03/12/2021.  She was also started on IV Keppra and controlling her blood pressure for MAP of 70. The case was discussed with neurosurgery who recommended to repeat a CT scan on 03/12/2021 that showed stable bilateral frontal lobe hemorrhagic intraparenchymal confusion associated edema but no mass-effect.No new infarcts.  Awaiting insurance authorization Assessment/Plan:   Traumatic intracranial hemorrhage: Likely due to recurrent falls, the patient has had several falls at home. Physical therapy evaluated the patient recommended skilled nursing facility. Discharge planning is awaiting insurance authorization.  Advance dementia/frequent falls: Patient lives with his son despite this she continues to have recurrent falls. She is at high risk of acute confusional state and aspiration pneumonia. Use Haldol IV as needed for agitation. Cont keppra.  Essential hypertension: Continue Norvasc and Coreg pressures been relatively well controlled MAP has been between 70-100  History of prior CVAs: Holding aspirin due to [place] continue Lipitor.  Ethics/goals of care: Patient is a DNR.  Chronic diastolic heart failure : with an EF of 70% and grade 1 diastolic heart failure, she appears to be compensated continue Coreg. Diuretics have been  discontinued.  Acute kidney injury: Baseline creatinine blood around 0.9, recheck is 1.2, likely hemodynamically mediated. Give a bolus recheck tomorrow morning.  Acute urinary retention: Having trouble urinating, will place Foley.   DVT prophylaxis: scd Family Communication:son Status is: Observation  The patient remains OBS appropriate and will d/c before 2 midnights.  Dispo: The patient is from: Home              Anticipated d/c is to: SNF              Patient currently is not medically stable to d/c.   Difficult to place patient No   Code Status:     Code Status Orders  (From admission, onward)           Start     Ordered   03/11/21 1615  Do not attempt resuscitation (DNR)  Continuous       Question Answer Comment  In the event of cardiac or respiratory ARREST Do not call a "code blue"   In the event of cardiac or respiratory ARREST Do not perform Intubation, CPR, defibrillation or ACLS   In the event of cardiac or respiratory ARREST Use medication by any route, position, wound care, and other measures to relive pain and suffering. May use oxygen, suction and manual treatment of airway obstruction as needed for comfort.   Comments D/w Son and Daughter      03/11/21 1616           Code Status History     Date Active Date Inactive Code Status Order ID Comments User Context   03/11/2021 1138 03/11/2021 1616 DNR [id number]  Carmin Muskrat, MD ED   12/31/2020 2305 01/01/2021 2218 Full Code [id number]  Emokpae, Ejiroghene  E, MD ED   04/09/2013 1918 04/11/2013 1559 Full Code [id number]  Hosie Poisson, MD Inpatient         IV Access:   Peripheral IV   Procedures and diagnostic studies:   No results found.   Medical Consultants:   None.   Subjective:    Amanda [name] no new complaints this morning. Objective:    Vitals:   03/28/21 1747 03/28/21 2021 03/29/21 0026 03/29/21 0509  BP: 121/65 115/65 130/74 126/60  Pulse: 64 72 83 64  Resp:  16 16 18    Temp:  97.6 F (36.4 C) (!) 97.5 F (36.4 C) (!) 97.4 F (36.3 C)  TempSrc:  Oral Oral Oral  SpO2:  97% 95% 99%  Weight:      Height:       SpO2: 99 %   Intake/Output Summary (Last 24 hours) at 03/29/2021 0738 Last data filed at 03/28/2021 2224 Gross per 24 hour  Intake --  Output 450 ml  Net -450 ml    Filed Weights   03/11/21 0737  Weight: 60 kg    Exam: General exam: In no acute distress. Respiratory system: Good air movement and clear to auscultation. Cardiovascular system: S1 & S2 heard, RRR. No JVD. Gastrointestinal system: Abdomen is nondistended, soft and nontender.  Extremities: No pedal edema. Skin: No rashes, lesions or ulcers  Data Reviewed:    Labs: Basic Metabolic Panel: Recent Labs  Lab 03/23/21 0355 03/25/21 0211  NA 140 138  K 3.9 4.0  CL 105 104  CO2 25 27  GLUCOSE 109* 133*  BUN 44* 38*  CREATININE 1.22* 1.09*  CALCIUM 11.8* 11.6*    GFR Estimated Creatinine Clearance: 29.4 mL/min (A) (by C-G formula based on SCr of 1.09 mg/dL (H)). Liver Function Tests: No results for input(s): AST, ALT, ALKPHOS, BILITOT, PROT, ALBUMIN in the last 168 hours. No results for input(s): LIPASE, AMYLASE in the last 168 hours. No results for input(s): AMMONIA in the last 168 hours. Coagulation profile No results for input(s): INR, PROTIME in the last 168 hours. COVID-19 Labs  No results for input(s): DDIMER, FERRITIN, LDH, CRP in the last 72 hours.  Lab Results  Component Value Date   SARSCOV2NAA NEGATIVE 03/11/2021   [place] NEGATIVE 12/31/2020   [place] NEGATIVE 04/17/2019    CBC: Recent Labs  Lab 03/23/21 0355  WBC 11.7*  HGB 11.8*  HCT 36.1  MCV 94.5  PLT 149*    Cardiac Enzymes: No results for input(s): CKTOTAL, CKMB, CKMBINDEX, TROPONINI in the last 168 hours. BNP (last 3 results) No results for input(s): PROBNP in the last 8760 hours. CBG: No results for input(s): GLUCAP in the last 168 hours.  D-Dimer: No results  for input(s): DDIMER in the last 72 hours. Hgb A1c: No results for input(s): HGBA1C in the last 72 hours. Lipid Profile: No results for input(s): CHOL, HDL, LDLCALC, TRIG, CHOLHDL, LDLDIRECT in the last 72 hours. Thyroid function studies: No results for input(s): TSH, T4TOTAL, T3FREE, THYROIDAB in the last 72 hours.  Invalid input(s): FREET3 Anemia work up: No results for input(s): VITAMINB12, FOLATE, FERRITIN, TIBC, IRON, RETICCTPCT in the last 72 hours. Sepsis Labs: Recent Labs  Lab 03/23/21 0355  WBC 11.7*    Microbiology No results found for this or any previous visit (from the past 240 hour(s)).    Medications:    atorvastatin  20 mg Oral QHS   carvedilol  3.125 mg Oral BID WC   Chlorhexidine Gluconate Cloth  6  each Topical Daily   darifenacin  7.5 mg Oral Daily   feeding supplement  237 mL Oral BID BM   iron polysaccharides  150 mg Oral Daily   pantoprazole  40 mg Oral Daily   sodium chloride flush  3 mL Intravenous Q12H   sodium chloride flush  3 mL Intravenous Q12H   verapamil  180 mg Oral QHS   Continuous Infusions:  sodium chloride 250 mL (03/11/21 2033)      LOS: 17 days   Charlynne Cousins  [facility]  03/29/2021, 7:38 AM

## 2021-03-31 DIAGNOSIS — R296 Repeated falls: Secondary | ICD-10-CM | POA: Diagnosis not present

## 2021-03-31 DIAGNOSIS — I10 Essential (primary) hypertension: Secondary | ICD-10-CM | POA: Diagnosis not present

## 2021-03-31 DIAGNOSIS — F0390 Unspecified dementia without behavioral disturbance: Secondary | ICD-10-CM | POA: Diagnosis not present

## 2021-03-31 DIAGNOSIS — R69 Illness, unspecified: Secondary | ICD-10-CM | POA: Diagnosis not present

## 2021-03-31 DIAGNOSIS — S066XAA Traumatic subarachnoid hemorrhage with loss of consciousness status unknown, initial encounter: Secondary | ICD-10-CM | POA: Diagnosis not present

## 2021-04-06 DIAGNOSIS — Z8673 Personal history of transient ischemic attack (TIA), and cerebral infarction without residual deficits: Secondary | ICD-10-CM | POA: Diagnosis not present

## 2021-04-06 DIAGNOSIS — D649 Anemia, unspecified: Secondary | ICD-10-CM | POA: Diagnosis not present

## 2021-04-06 DIAGNOSIS — I2510 Atherosclerotic heart disease of native coronary artery without angina pectoris: Secondary | ICD-10-CM | POA: Diagnosis not present

## 2021-04-06 DIAGNOSIS — E7800 Pure hypercholesterolemia, unspecified: Secondary | ICD-10-CM | POA: Diagnosis not present

## 2021-04-06 DIAGNOSIS — E119 Type 2 diabetes mellitus without complications: Secondary | ICD-10-CM | POA: Diagnosis not present

## 2021-04-06 DIAGNOSIS — D518 Other vitamin B12 deficiency anemias: Secondary | ICD-10-CM | POA: Diagnosis not present

## 2021-04-06 DIAGNOSIS — E559 Vitamin D deficiency, unspecified: Secondary | ICD-10-CM | POA: Diagnosis not present

## 2021-04-06 DIAGNOSIS — N189 Chronic kidney disease, unspecified: Secondary | ICD-10-CM | POA: Diagnosis not present

## 2021-04-06 DIAGNOSIS — J189 Pneumonia, unspecified organism: Secondary | ICD-10-CM | POA: Diagnosis not present

## 2021-04-06 DIAGNOSIS — E039 Hypothyroidism, unspecified: Secondary | ICD-10-CM | POA: Diagnosis not present

## 2021-04-06 DIAGNOSIS — Z79899 Other long term (current) drug therapy: Secondary | ICD-10-CM | POA: Diagnosis not present

## 2021-04-06 DIAGNOSIS — E785 Hyperlipidemia, unspecified: Secondary | ICD-10-CM | POA: Diagnosis not present

## 2021-04-09 DIAGNOSIS — S066X0D Traumatic subarachnoid hemorrhage without loss of consciousness, subsequent encounter: Secondary | ICD-10-CM | POA: Diagnosis not present

## 2021-04-09 DIAGNOSIS — M6281 Muscle weakness (generalized): Secondary | ICD-10-CM | POA: Diagnosis not present

## 2021-04-09 DIAGNOSIS — R296 Repeated falls: Secondary | ICD-10-CM | POA: Diagnosis not present

## 2021-04-12 DIAGNOSIS — S066X0D Traumatic subarachnoid hemorrhage without loss of consciousness, subsequent encounter: Secondary | ICD-10-CM | POA: Diagnosis not present

## 2021-04-12 DIAGNOSIS — M6281 Muscle weakness (generalized): Secondary | ICD-10-CM | POA: Diagnosis not present

## 2021-04-12 DIAGNOSIS — R296 Repeated falls: Secondary | ICD-10-CM | POA: Diagnosis not present

## 2021-04-13 DIAGNOSIS — Z79899 Other long term (current) drug therapy: Secondary | ICD-10-CM | POA: Diagnosis not present

## 2021-04-13 DIAGNOSIS — J189 Pneumonia, unspecified organism: Secondary | ICD-10-CM | POA: Diagnosis not present

## 2021-04-13 DIAGNOSIS — S066X0D Traumatic subarachnoid hemorrhage without loss of consciousness, subsequent encounter: Secondary | ICD-10-CM | POA: Diagnosis not present

## 2021-04-13 DIAGNOSIS — R051 Acute cough: Secondary | ICD-10-CM | POA: Diagnosis not present

## 2021-04-13 DIAGNOSIS — R296 Repeated falls: Secondary | ICD-10-CM | POA: Diagnosis not present

## 2021-04-13 DIAGNOSIS — R062 Wheezing: Secondary | ICD-10-CM | POA: Diagnosis not present

## 2021-04-13 DIAGNOSIS — M6281 Muscle weakness (generalized): Secondary | ICD-10-CM | POA: Diagnosis not present

## 2021-04-14 DIAGNOSIS — R296 Repeated falls: Secondary | ICD-10-CM | POA: Diagnosis not present

## 2021-04-14 DIAGNOSIS — M6281 Muscle weakness (generalized): Secondary | ICD-10-CM | POA: Diagnosis not present

## 2021-04-14 DIAGNOSIS — S066X0D Traumatic subarachnoid hemorrhage without loss of consciousness, subsequent encounter: Secondary | ICD-10-CM | POA: Diagnosis not present

## 2021-04-15 DIAGNOSIS — M6281 Muscle weakness (generalized): Secondary | ICD-10-CM | POA: Diagnosis not present

## 2021-04-15 DIAGNOSIS — R296 Repeated falls: Secondary | ICD-10-CM | POA: Diagnosis not present

## 2021-04-15 DIAGNOSIS — S066X0D Traumatic subarachnoid hemorrhage without loss of consciousness, subsequent encounter: Secondary | ICD-10-CM | POA: Diagnosis not present

## 2021-04-16 ENCOUNTER — Encounter: Payer: Self-pay | Admitting: Urology

## 2021-04-16 ENCOUNTER — Other Ambulatory Visit: Payer: Self-pay

## 2021-04-16 ENCOUNTER — Ambulatory Visit (INDEPENDENT_AMBULATORY_CARE_PROVIDER_SITE_OTHER): Payer: Medicare HMO | Admitting: Urology

## 2021-04-16 VITALS — BP 100/60 | HR 71

## 2021-04-16 DIAGNOSIS — R339 Retention of urine, unspecified: Secondary | ICD-10-CM

## 2021-04-16 DIAGNOSIS — J189 Pneumonia, unspecified organism: Secondary | ICD-10-CM | POA: Diagnosis not present

## 2021-04-16 DIAGNOSIS — N390 Urinary tract infection, site not specified: Secondary | ICD-10-CM | POA: Diagnosis not present

## 2021-04-16 DIAGNOSIS — R296 Repeated falls: Secondary | ICD-10-CM | POA: Diagnosis not present

## 2021-04-16 DIAGNOSIS — N3281 Overactive bladder: Secondary | ICD-10-CM

## 2021-04-16 DIAGNOSIS — M6281 Muscle weakness (generalized): Secondary | ICD-10-CM | POA: Diagnosis not present

## 2021-04-16 DIAGNOSIS — R062 Wheezing: Secondary | ICD-10-CM | POA: Diagnosis not present

## 2021-04-16 DIAGNOSIS — R051 Acute cough: Secondary | ICD-10-CM | POA: Diagnosis not present

## 2021-04-16 DIAGNOSIS — S066X0D Traumatic subarachnoid hemorrhage without loss of consciousness, subsequent encounter: Secondary | ICD-10-CM | POA: Diagnosis not present

## 2021-04-16 LAB — URINALYSIS, ROUTINE W REFLEX MICROSCOPIC
Bilirubin, UA: NEGATIVE
Glucose, UA: NEGATIVE
Ketones, UA: NEGATIVE
Nitrite, UA: NEGATIVE
Protein,UA: NEGATIVE
RBC, UA: NEGATIVE
Specific Gravity, UA: >1.03 — ABNORMAL HIGH (ref 1.005–1.030)
Urobilinogen, Ur: 0.2 mg/dL (ref 0.2–1.0)
pH, UA: 6 (ref 5.0–7.5)

## 2021-04-16 LAB — MICROSCOPIC EXAMINATION: RBC, Urine: NONE SEEN /hpf (ref 0–2)

## 2021-04-16 LAB — BLADDER SCAN AMB NON-IMAGING: Scan Result: 340

## 2021-04-16 MED ORDER — TRIMETHOPRIM 100 MG PO TABS: 100.0000 mg | ORAL_TABLET | Freq: Every day | ORAL | 3 refills | Status: DC

## 2021-04-16 MED ORDER — SOLIFENACIN SUCCINATE 5 MG PO TABS: 5.0000 mg | ORAL_TABLET | Freq: Every day | ORAL | 3 refills | Status: DC

## 2021-04-16 NOTE — Progress Notes (Signed)
04/16/2021 11:00 AM   Amanda [name] January 24, 1931 [id number]  Referring provider: [name], DO [place],  [place] [id number]  Followup recurrent UTI  HPI: Amanda [name] is a 87yo here for followuop for recurrent UTI and OAB. She is currently on trimethoprim 100mg  qhs and she reports no UTIs since her last visit 1 year ago. She has urinary urgency and frequency for which she takes vesicare 5mg  daily. PVR 340cc today. She urinates better when she urinates on a schedule. Urine stream strong.    PMH: Past Medical History:  Diagnosis Date   Allergy    Anxiety    Coronary artery disease    LAD 30% followed by 60% stenosis; pressure wire measurement demonstrated no significant gradient; circumflex had 30% stenosis, right coronary artery has 40% stenosed; EF 75-80%   Diverticulosis    GERD (gastroesophageal reflux disease)    Hematuria    Hyperlipidemia    Hypertension    Impaired renal function 04/23/2018   Stroke ([place]) 2003   With a left MCA occlusion   TIA (transient ischemic attack)     Surgical History: Past Surgical History:  Procedure Laterality Date   ABDOMINAL HYSTERECTOMY     partial and complete - x  surgeries   APPENDECTOMY     CHOLECYSTECTOMY     COLONOSCOPY N/A 04/19/2019   TI normal, four sessile polyps, 2-6 mm in size, multiple small and large-mouthed diverticula in entire colon, external and internal hemorrhoids, (tubular adenoma, sessile serrated polyp without dysplasia)   ESOPHAGOGASTRODUODENOSCOPY N/A 04/19/2019   mild schatzki ring s/p dilation, multiple gastric polyps (fundic), non-bleeding duodenal diverticulum, benign small bowel, no villous, no dysplasia)   EYE SURGERY Bilateral    cataracts   Hysterectomy-type unspecified     POLYPECTOMY  04/19/2019   Procedure: POLYPECTOMY;  Surgeon: Danie Binder, MD;  Location: [facility];  Service: Endoscopy;;  colon    TONSILLECTOMY      Home Medications:  Allergies as of 04/16/2021        Reactions   Livalo [pitavastatin] Other (See Comments)   Causes dizziness   Simvastatin Other (See Comments)   Causes dizziness   Lisinopril    Hallucinations, resolved on ARB        Medication List        Accurate as of April 16, 2021 11:00 AM. If you have any questions, ask your nurse or doctor.          feeding supplement Liqd Take 237 mLs by mouth 2 (two) times daily between meals.   iron polysaccharides 150 MG capsule Commonly known as: NIFEREX Take 150 mg by mouth daily.   Lipitor 20 MG tablet Generic drug: atorvastatin TAKE 1 TABLET AT BEDTIME.   omeprazole 20 MG capsule Commonly known as: PRILOSEC TAKE 1 CAPSULE (20 MG TOTAL) BY MOUTH 2 (TWO) TIMES DAILY BEFORE A MEAL.   solifenacin 5 MG tablet Commonly known as: VESICARE Take 1 tablet (5 mg total) by mouth daily.   verapamil 180 MG CR tablet Commonly known as: CALAN-SR Take 1 tablet (180 mg total) by mouth at bedtime.   VITAMIN D PO 1,000 Int'l Units/day.        Allergies:  Allergies  Allergen Reactions   Livalo [Pitavastatin] Other (See Comments)    Causes dizziness   Simvastatin Other (See Comments)    Causes dizziness   Lisinopril     Hallucinations, resolved on ARB    Family History: Family History  Problem Relation Age of Onset   Colon cancer Mother 54       dx on colonoscopy    Cancer Mother        colon   Cancer Father        unsure if cancer  --- tumor on brain   Heart attack Sister    Heart attack Brother    Diabetes Brother    GI problems Son    COPD Sister    Hypertension Brother    Heart disease Brother    Heart attack Brother    Heart disease Brother    Alcohol abuse Brother     Social History:  reports that she quit smoking about 35 years ago. Her smoking use included cigarettes. She has a 0.75 pack-year smoking history. She has never used smokeless tobacco. She reports that she does not drink alcohol and does not use drugs.  ROS: All other review of  systems were reviewed and are negative except what is noted above in HPI  Physical Exam: BP 100/60   Pulse 71   Constitutional:  Alert and oriented, No acute distress. HEENT: [place] AT, moist mucus membranes.  Trachea midline, no masses. Cardiovascular: No clubbing, cyanosis, or edema. Respiratory: Normal respiratory effort, no increased work of breathing. GI: Abdomen is soft, nontender, nondistended, no abdominal masses GU: No CVA tenderness.  Lymph: No cervical or inguinal lymphadenopathy. Skin: No rashes, bruises or suspicious lesions. Neurologic: Grossly intact, no focal deficits, moving all 4 extremities. Psychiatric: Normal mood and affect.  Laboratory Data: Lab Results  Component Value Date   WBC 11.7 (H) 03/23/2021   HGB 11.8 (L) 03/23/2021   HCT 36.1 03/23/2021   MCV 94.5 03/23/2021   PLT 149 (L) 03/23/2021    Lab Results  Component Value Date   CREATININE 1.09 (H) 03/25/2021    No results found for: PSA  No results found for: TESTOSTERONE  Lab Results  Component Value Date   HGBA1C 5.8 (H) 01/01/2021    Urinalysis    Component Value Date/Time   COLORURINE YELLOW 03/26/2021 0535   APPEARANCEUR HAZY (A) 03/26/2021 0535   APPEARANCEUR Clear 03/19/2020 0930   LABSPEC 1.012 03/26/2021 0535   PHURINE 7.0 03/26/2021 [place] 03/26/2021 0535   HGBUR SMALL (A) 03/26/2021 [place] 03/26/2021 0535   BILIRUBINUR Negative 03/19/2020 0930   KETONESUR NEGATIVE 03/26/2021 0535   PROTEINUR NEGATIVE 03/26/2021 0535   UROBILINOGEN 0.2 12/18/2019 1009   UROBILINOGEN 1.0 04/10/2013 2115   NITRITE NEGATIVE 03/26/2021 0535   LEUKOCYTESUR LARGE (A) 03/26/2021 0535    Lab Results  Component Value Date   LABMICR See below: 03/19/2020   WBCUA 6-10 (A) 03/19/2020   RBCUA 11-30 (A) 08/14/2018   LABEPIT 0-10 03/19/2020   MUCUS Present 05/07/2019   BACTERIA NONE SEEN 03/26/2021    Pertinent Imaging:  No results found for this or any  previous visit.  No results found for this or any previous visit.  No results found for this or any previous visit.  No results found for this or any previous visit.  Results for orders placed during the hospital encounter of 04/13/18  US RENAL  Narrative CLINICAL DATA:  Chronic kidney disease.  History of hypertension.  EXAM: RENAL / URINARY TRACT ULTRASOUND COMPLETE  COMPARISON:  None.  FINDINGS: Right Kidney:  Renal measurements: 9.9 x 4 x 4.9 cm = volume: 102 mL . Echogenicity within normal limits. No mass or hydronephrosis visualized.  Left  Kidney:  Renal measurements: 9.6 x 3.9 x 5.5 cm = volume: 108 mL. Echogenicity within normal limits. No mass or hydronephrosis visualized.  Bladder:  Appears normal for degree of bladder distention. Ureteral jets not identified.  Included liver appears mildly echogenic seen with hepatocellular disease/steatosis.  IMPRESSION: Negative renal ultrasound.   Electronically Signed By: Elon Alas M.D. On: 04/13/2018 14:24  No results found for this or any previous visit.  No results found for this or any previous visit.  No results found for this or any previous visit.   Assessment & Plan:    1. Urinary retention -We will start double voiding - Urinalysis, Routine w reflex microscopic - BLADDER SCAN AMB NON-IMAGING  2. Recurrent UTI -Continue trimethoprim 100mg  QHS  3. OAB (overactive bladder) -We will continue vesicare 5mg  daily -She should start timed voiding every 3-4 hours to imrpove her bladder emptying   No follow-ups on file.  Nicolette Bang, MD  [facility] [place]

## 2021-04-16 NOTE — Progress Notes (Signed)
post void residual=340  Urological Symptom Review  Patient is experiencing the following symptoms: Get up at night to urinate Blood in urine   Review of Systems  Gastrointestinal (upper)  : Negative for upper GI symptoms  Gastrointestinal (lower) : Negative for lower GI symptoms  Constitutional : Night Sweats  Skin: Negative for skin symptoms  Eyes: Negative for eye symptoms  Ear/Nose/Throat : Negative for Ear/Nose/Throat symptoms  Hematologic/Lymphatic: Easy bruising  Cardiovascular : Negative for cardiovascular symptoms  Respiratory : Negative for respiratory symptoms  Endocrine: Negative for endocrine symptoms  Musculoskeletal: Negative for musculoskeletal symptoms  Neurological: Negative for neurological symptoms  Psychologic: Negative for psychiatric symptoms

## 2021-04-16 NOTE — Patient Instructions (Signed)

## 2021-04-19 ENCOUNTER — Other Ambulatory Visit: Payer: Self-pay

## 2021-04-19 ENCOUNTER — Ambulatory Visit: Payer: Medicare HMO | Admitting: Diagnostic Neuroimaging

## 2021-04-19 ENCOUNTER — Encounter: Payer: Self-pay | Admitting: Diagnostic Neuroimaging

## 2021-04-19 VITALS — BP 110/69 | HR 69 | Ht 60.0 in

## 2021-04-19 DIAGNOSIS — R296 Repeated falls: Secondary | ICD-10-CM | POA: Diagnosis not present

## 2021-04-19 DIAGNOSIS — E785 Hyperlipidemia, unspecified: Secondary | ICD-10-CM | POA: Diagnosis not present

## 2021-04-19 DIAGNOSIS — N390 Urinary tract infection, site not specified: Secondary | ICD-10-CM

## 2021-04-19 DIAGNOSIS — R413 Other amnesia: Secondary | ICD-10-CM | POA: Diagnosis not present

## 2021-04-19 DIAGNOSIS — S066X9A Traumatic subarachnoid hemorrhage with loss of consciousness of unspecified duration, initial encounter: Secondary | ICD-10-CM | POA: Diagnosis not present

## 2021-04-19 DIAGNOSIS — S06331S Contusion and laceration of cerebrum, unspecified, with loss of consciousness of 30 minutes or less, sequela: Secondary | ICD-10-CM

## 2021-04-19 DIAGNOSIS — S066X0D Traumatic subarachnoid hemorrhage without loss of consciousness, subsequent encounter: Secondary | ICD-10-CM | POA: Diagnosis not present

## 2021-04-19 DIAGNOSIS — N3281 Overactive bladder: Secondary | ICD-10-CM

## 2021-04-19 DIAGNOSIS — R058 Other specified cough: Secondary | ICD-10-CM | POA: Diagnosis not present

## 2021-04-19 DIAGNOSIS — M6281 Muscle weakness (generalized): Secondary | ICD-10-CM | POA: Diagnosis not present

## 2021-04-19 DIAGNOSIS — Z8673 Personal history of transient ischemic attack (TIA), and cerebral infarction without residual deficits: Secondary | ICD-10-CM | POA: Diagnosis not present

## 2021-04-19 MED ORDER — SOLIFENACIN SUCCINATE 5 MG PO TABS: 5.0000 mg | ORAL_TABLET | Freq: Every day | ORAL | 3 refills | Status: AC

## 2021-04-19 MED ORDER — TRIMETHOPRIM 100 MG PO TABS: 100.0000 mg | ORAL_TABLET | Freq: Every day | ORAL | 3 refills | Status: DC

## 2021-04-19 NOTE — Progress Notes (Signed)
[facility]  PATIENT: Amanda [name] DOB: February 06, 1931  REFERRING CLINICIAN: [name], DO HISTORY FROM: patient  REASON FOR VISIT: new consult    HISTORICAL  CHIEF COMPLAINT:  Chief Complaint  Patient presents with   Head Injury    HISTORY OF PRESENT ILLNESS:   87 year old female here for evaluation of traumatic intracranial hemorrhage.  History of hypertension, dementia, hyperlipidemia.  Patient called at home and was found by son.  She was brought to the emergency room.  CT scan head showed bilateral frontal hemorrhagic contusions and small amount of subarachnoid hemorrhage.  Repeat CT scan showed stable contusions.  She was transferred to skilled nursing facility.  Since then patient is stable.  She is back to baseline.  REVIEW OF SYSTEMS: Full 14 system review of systems performed and negative with exception of: as per HPI.  ALLERGIES: Allergies  Allergen Reactions   Livalo [Pitavastatin] Other (See Comments)    Causes dizziness   Simvastatin Other (See Comments)    Causes dizziness   Lisinopril     Hallucinations, resolved on ARB    HOME MEDICATIONS: Outpatient Medications Prior to Visit  Medication Sig Dispense Refill   aspirin EC 81 MG tablet Take 81 mg by mouth daily. Swallow whole.     atorvastatin (LIPITOR) 20 MG tablet TAKE 1 TABLET AT BEDTIME. 90 tablet 0   feeding supplement (ENSURE ENLIVE / ENSURE PLUS) LIQD Take 237 mLs by mouth 2 (two) times daily between meals. 237 mL 12   guaiFENesin (MUCINEX) 600 MG 12 hr tablet Take by mouth 2 (two) times daily.     iron polysaccharides (NIFEREX) 150 MG capsule Take 150 mg by mouth daily.     levofloxacin (LEVAQUIN) 500 MG tablet Take 500 mg by mouth daily.     omeprazole (PRILOSEC) 20 MG capsule TAKE 1 CAPSULE (20 MG TOTAL) BY MOUTH 2 (TWO) TIMES DAILY BEFORE A MEAL. 180 capsule 3   solifenacin (VESICARE) 5 MG tablet Take 1 tablet (5 mg total) by mouth daily. 90 tablet 3   verapamil  (CALAN-SR) 180 MG CR tablet Take 1 tablet (180 mg total) by mouth at bedtime. 90 tablet 0   VITAMIN D PO 1,000 Int'l Units/day.     trimethoprim (TRIMPEX) 100 MG tablet Take 1 tablet (100 mg total) by mouth daily. (Patient not taking: Reported on 04/19/2021) 90 tablet 3   No facility-administered medications prior to visit.    PAST MEDICAL HISTORY: Past Medical History:  Diagnosis Date   Allergy    Anxiety    Coronary artery disease    LAD 30% followed by 60% stenosis; pressure wire measurement demonstrated no significant gradient; circumflex had 30% stenosis, right coronary artery has 40% stenosed; EF 75-80%   Diverticulosis    GERD (gastroesophageal reflux disease)    Hematuria    Hyperlipidemia    Hypertension    Impaired renal function 04/23/2018   Stroke ([place]) 2003   With a left MCA occlusion   TIA (transient ischemic attack)     PAST SURGICAL HISTORY: Past Surgical History:  Procedure Laterality Date   ABDOMINAL HYSTERECTOMY     partial and complete - x  surgeries   APPENDECTOMY     CHOLECYSTECTOMY     COLONOSCOPY N/A 04/19/2019   TI normal, four sessile polyps, 2-6 mm in size, multiple small and large-mouthed diverticula in entire colon, external and internal hemorrhoids, (tubular adenoma, sessile serrated polyp without dysplasia)   ESOPHAGOGASTRODUODENOSCOPY N/A 04/19/2019   mild schatzki  ring s/p dilation, multiple gastric polyps (fundic), non-bleeding duodenal diverticulum, benign small bowel, no villous, no dysplasia)   EYE SURGERY Bilateral    cataracts   Hysterectomy-type unspecified     POLYPECTOMY  04/19/2019   Procedure: POLYPECTOMY;  Surgeon: Danie Binder, MD;  Location: [facility];  Service: Endoscopy;;  colon    TONSILLECTOMY      FAMILY HISTORY: Family History  Problem Relation Age of Onset   Colon cancer Mother 49       dx on colonoscopy    Cancer Mother        colon   Cancer Father        unsure if cancer  --- tumor on brain   Heart  attack Sister    Heart attack Brother    Diabetes Brother    GI problems Son    COPD Sister    Hypertension Brother    Heart disease Brother    Heart attack Brother    Heart disease Brother    Alcohol abuse Brother     SOCIAL HISTORY: Social History   Socioeconomic History   Marital status: Widowed    Spouse name: Not on file   Number of children: 2   Years of education: 12   Highest education level: High school graduate  Occupational History   Occupation: Horticulturist, commercial: RETIRED    Comment: Retired  Tobacco Use   Smoking status: Former    Packs/day: 0.25    Years: 3.00    Pack years: 0.75    Types: Cigarettes    Quit date: 06/06/1985    Years since quitting: 35.8   Smokeless tobacco: Never   Tobacco comments:    Approximately 10-pack-year history  Vaping Use   Vaping Use: Never used  Substance and Sexual Activity   Alcohol use: No   Drug use: No   Sexual activity: Not Currently  Other Topics Concern   Not on file  Social History Narrative   04/19/21 residing at [facility] and  [place] in [place] with her son.Has been widowed for about 3 years.   Social Determinants of Health   Financial Resource Strain: Not on file  Food Insecurity: Not on file  Transportation Needs: Not on file  Physical Activity: Not on file  Stress: Not on file  Social Connections: Not on file  Intimate Partner Violence: Not on file     PHYSICAL EXAM  GENERAL EXAM/CONSTITUTIONAL: Vitals:  Vitals:   04/19/21 1053  BP: 110/69  Pulse: 69  Height: 5' (1.524 m)   Body mass index is 25.83 kg/m. Wt Readings from Last 3 Encounters:  03/11/21 132 lb 4.4 oz (60 kg)  01/12/21 124 lb 6.4 oz (56.4 kg)  12/31/20 130 lb (59 kg)   Patient is in no distress; well developed, nourished and groomed; neck is supple  CARDIOVASCULAR: Examination of carotid arteries is normal; no carotid bruits Regular rate and rhythm, no murmurs Examination of peripheral vascular system  by observation and palpation is normal  EYES: Ophthalmoscopic exam of optic discs and posterior segments is normal; no papilledema or hemorrhages No results found.  MUSCULOSKELETAL: Gait, strength, tone, movements noted in Neurologic exam below  NEUROLOGIC: MENTAL STATUS:  MMSE - Mini Mental State Exam 01/12/2021 08/07/2017  Orientation to time 5 5  Orientation to Place 5 5  Registration 3 3  Attention/ Calculation 5 5  Recall 1 3  Language- name 2 objects 2 2  Language- repeat 0 1  Language- follow 3 step command 1 3  Language- read & follow direction 1 1  Write a sentence 1 1  Copy design 0 1  Total score 24 30   awake, alert, oriented to person [place] attention and concentration language fluent, comprehension intact, naming intact fund of knowledge appropriate  CRANIAL NERVE:  2nd - no papilledema on fundoscopic exam 2nd, 3rd, 4th, 6th - pupils equal and reactive to light, visual fields full to confrontation, extraocular muscles intact, no nystagmus 5th - facial sensation symmetric 7th - facial strength symmetric 8th - hearing intact 9th - palate elevates symmetrically, uvula midline 11th - shoulder shrug symmetric 12th - tongue protrusion midline  MOTOR:  normal bulk and tone, DIFFUSE 4/5 strength in the BUE, BLE  SENSORY:  normal and symmetric to light touch  COORDINATION:  finger-nose-finger, fine finger movements normal  REFLEXES:  deep tendon reflexes TRACE and symmetric  GAIT/STATION:  IN WHEELCHAIR     DIAGNOSTIC DATA (LABS, IMAGING, TESTING) - I reviewed patient records, labs, notes, testing and imaging myself where available.  Lab Results  Component Value Date   WBC 11.7 (H) 03/23/2021   HGB 11.8 (L) 03/23/2021   HCT 36.1 03/23/2021   MCV 94.5 03/23/2021   PLT 149 (L) 03/23/2021      Component Value Date/Time   NA 138 03/25/2021 0211   NA 141 10/23/2020 1603   K 4.0 03/25/2021 0211   CL 104 03/25/2021 0211   CO2 27  03/25/2021 0211   GLUCOSE 133 (H) 03/25/2021 0211   BUN 38 (H) 03/25/2021 0211   BUN 30 (H) 10/23/2020 1603   CREATININE 1.09 (H) 03/25/2021 0211   CALCIUM 11.6 (H) 03/25/2021 0211   PROT 7.1 12/31/2020 1355   PROT 6.4 05/04/2020 0859   ALBUMIN 4.2 12/31/2020 1355   ALBUMIN 4.6 10/23/2020 1603   AST 15 12/31/2020 1355   ALT 11 12/31/2020 1355   ALKPHOS 85 12/31/2020 1355   BILITOT 0.7 12/31/2020 1355   BILITOT 0.5 05/04/2020 0859   GFRNONAA 49 (L) 03/25/2021 0211   GFRAA 52 (L) 05/04/2020 0859   Lab Results  Component Value Date   CHOL 109 01/01/2021   HDL 39 (L) 01/01/2021   LDLCALC 23 01/01/2021   LDLDIRECT 27 12/20/2016   TRIG 236 (H) 01/01/2021   CHOLHDL 2.8 01/01/2021   Lab Results  Component Value Date   HGBA1C 5.8 (H) 01/01/2021   No results found for: VITAMINB12 Lab Results  Component Value Date   TSH 3.330 07/18/2017    03/12/21 CT head [I reviewed images myself and agree with interpretation. -VRP]  1. Stable bilateral frontal lobe hemorrhagic intraparenchymal contusions and associated edema. No mass effect. 2. Stable small volume subarachnoid hemorrhage. 3. No new infarct or additional hemorrhage.    ASSESSMENT AND PLAN  87 y.o. year old female here with:   Dx:  1. Contusion of cerebrum with loss of consciousness of 30 minutes or less, unspecified laterality, sequela ([place])       PLAN:  TRAUMATIC CEREBRAL CONTUSION / [place] - repeat CT head; once acute blood products have cleared, then may restart aspirin 81mg  daily (due to history of TIA)  MILD DEMENTIA - supportive care / per SNF and PCP  Orders Placed This Encounter  Procedures   CT HEAD WO CONTRAST (5MM)   Return for pending if symptoms worsen or fail to improve, pending test results.    Penni Bombard, MD 04/19/2021, 11:50 AM  Certified in Neurology, Neurophysiology and [facility] [address] [place], [place] [address] [phone]

## 2021-04-19 NOTE — Patient Instructions (Signed)
TRAUMATIC CEREBRAL CONTUSION / [place] - repeat CT head; once acute blood products have cleared, then may restart aspirin 81mg  daily (due to history of TIA)  MILD DEMENTIA - supportive care / per SNF and PCP

## 2021-04-22 ENCOUNTER — Telehealth: Payer: Self-pay | Admitting: Diagnostic Neuroimaging

## 2021-04-22 NOTE — Telephone Encounter (Signed)
[employer] medicare Josem Kaufmann: [id number] (exp. 04/22/21 to 10/19/21) patient is scheduled at [facility] for 05/28/21.

## 2021-04-23 DIAGNOSIS — R0989 Other specified symptoms and signs involving the circulatory and respiratory systems: Secondary | ICD-10-CM | POA: Diagnosis not present

## 2021-04-26 ENCOUNTER — Ambulatory Visit: Payer: Medicare PPO | Admitting: Family Medicine

## 2021-04-26 DIAGNOSIS — R051 Acute cough: Secondary | ICD-10-CM | POA: Diagnosis not present

## 2021-04-26 DIAGNOSIS — J189 Pneumonia, unspecified organism: Secondary | ICD-10-CM | POA: Diagnosis not present

## 2021-04-28 ENCOUNTER — Encounter (HOSPITAL_COMMUNITY): Payer: Self-pay

## 2021-04-28 ENCOUNTER — Emergency Department (HOSPITAL_COMMUNITY): Payer: Medicare HMO

## 2021-04-28 ENCOUNTER — Other Ambulatory Visit: Payer: Self-pay

## 2021-04-28 ENCOUNTER — Inpatient Hospital Stay (HOSPITAL_COMMUNITY)
Admission: EM | Admit: 2021-04-28 | Discharge: 2021-05-07 | DRG: 082 | Disposition: A | Discharge status: Hospice | Payer: Medicare HMO | Attending: Family Medicine | Admitting: Family Medicine

## 2021-04-28 DIAGNOSIS — Z90710 Acquired absence of both cervix and uterus: Secondary | ICD-10-CM | POA: Diagnosis not present

## 2021-04-28 DIAGNOSIS — F419 Anxiety disorder, unspecified: Secondary | ICD-10-CM | POA: Diagnosis present

## 2021-04-28 DIAGNOSIS — Y929 Unspecified place or not applicable: Secondary | ICD-10-CM | POA: Diagnosis not present

## 2021-04-28 DIAGNOSIS — J9601 Acute respiratory failure with hypoxia: Secondary | ICD-10-CM | POA: Diagnosis present

## 2021-04-28 DIAGNOSIS — J9811 Atelectasis: Secondary | ICD-10-CM | POA: Diagnosis present

## 2021-04-28 DIAGNOSIS — I2694 Multiple subsegmental pulmonary emboli without acute cor pulmonale: Secondary | ICD-10-CM | POA: Diagnosis not present

## 2021-04-28 DIAGNOSIS — N183 Chronic kidney disease, stage 3 unspecified: Secondary | ICD-10-CM | POA: Diagnosis not present

## 2021-04-28 DIAGNOSIS — I609 Nontraumatic subarachnoid hemorrhage, unspecified: Secondary | ICD-10-CM

## 2021-04-28 DIAGNOSIS — R69 Illness, unspecified: Secondary | ICD-10-CM | POA: Diagnosis not present

## 2021-04-28 DIAGNOSIS — Z7982 Long term (current) use of aspirin: Secondary | ICD-10-CM | POA: Diagnosis not present

## 2021-04-28 DIAGNOSIS — I10 Essential (primary) hypertension: Secondary | ICD-10-CM | POA: Diagnosis present

## 2021-04-28 DIAGNOSIS — Z7401 Bed confinement status: Secondary | ICD-10-CM | POA: Diagnosis not present

## 2021-04-28 DIAGNOSIS — N898 Other specified noninflammatory disorders of vagina: Secondary | ICD-10-CM

## 2021-04-28 DIAGNOSIS — Z825 Family history of asthma and other chronic lower respiratory diseases: Secondary | ICD-10-CM | POA: Diagnosis not present

## 2021-04-28 DIAGNOSIS — S066XAA Traumatic subarachnoid hemorrhage with loss of consciousness status unknown, initial encounter: Secondary | ICD-10-CM | POA: Diagnosis not present

## 2021-04-28 DIAGNOSIS — S0631AA Contusion and laceration of right cerebrum with loss of consciousness status unknown, initial encounter: Secondary | ICD-10-CM | POA: Diagnosis not present

## 2021-04-28 DIAGNOSIS — Z87891 Personal history of nicotine dependence: Secondary | ICD-10-CM | POA: Diagnosis not present

## 2021-04-28 DIAGNOSIS — R64 Cachexia: Secondary | ICD-10-CM | POA: Diagnosis present

## 2021-04-28 DIAGNOSIS — R627 Adult failure to thrive: Secondary | ICD-10-CM | POA: Diagnosis present

## 2021-04-28 DIAGNOSIS — N1832 Chronic kidney disease, stage 3b: Secondary | ICD-10-CM | POA: Diagnosis present

## 2021-04-28 DIAGNOSIS — F0390 Unspecified dementia without behavioral disturbance: Secondary | ICD-10-CM | POA: Diagnosis not present

## 2021-04-28 DIAGNOSIS — J341 Cyst and mucocele of nose and nasal sinus: Secondary | ICD-10-CM | POA: Diagnosis not present

## 2021-04-28 DIAGNOSIS — I6782 Cerebral ischemia: Secondary | ICD-10-CM | POA: Diagnosis not present

## 2021-04-28 DIAGNOSIS — E785 Hyperlipidemia, unspecified: Secondary | ICD-10-CM | POA: Diagnosis present

## 2021-04-28 DIAGNOSIS — R0602 Shortness of breath: Secondary | ICD-10-CM | POA: Diagnosis not present

## 2021-04-28 DIAGNOSIS — N179 Acute kidney failure, unspecified: Secondary | ICD-10-CM | POA: Diagnosis not present

## 2021-04-28 DIAGNOSIS — Z833 Family history of diabetes mellitus: Secondary | ICD-10-CM | POA: Diagnosis not present

## 2021-04-28 DIAGNOSIS — I618 Other nontraumatic intracerebral hemorrhage: Secondary | ICD-10-CM | POA: Diagnosis not present

## 2021-04-28 DIAGNOSIS — Z7189 Other specified counseling: Secondary | ICD-10-CM | POA: Diagnosis not present

## 2021-04-28 DIAGNOSIS — Z515 Encounter for palliative care: Secondary | ICD-10-CM

## 2021-04-28 DIAGNOSIS — G929 Unspecified toxic encephalopathy: Secondary | ICD-10-CM | POA: Diagnosis not present

## 2021-04-28 DIAGNOSIS — N390 Urinary tract infection, site not specified: Secondary | ICD-10-CM | POA: Diagnosis present

## 2021-04-28 DIAGNOSIS — I619 Nontraumatic intracerebral hemorrhage, unspecified: Secondary | ICD-10-CM | POA: Diagnosis not present

## 2021-04-28 DIAGNOSIS — R319 Hematuria, unspecified: Secondary | ICD-10-CM | POA: Diagnosis present

## 2021-04-28 DIAGNOSIS — Z79899 Other long term (current) drug therapy: Secondary | ICD-10-CM | POA: Diagnosis not present

## 2021-04-28 DIAGNOSIS — W19XXXA Unspecified fall, initial encounter: Secondary | ICD-10-CM | POA: Diagnosis not present

## 2021-04-28 DIAGNOSIS — K219 Gastro-esophageal reflux disease without esophagitis: Secondary | ICD-10-CM | POA: Diagnosis present

## 2021-04-28 DIAGNOSIS — Z6821 Body mass index (BMI) 21.0-21.9, adult: Secondary | ICD-10-CM

## 2021-04-28 DIAGNOSIS — E876 Hypokalemia: Secondary | ICD-10-CM | POA: Diagnosis present

## 2021-04-28 DIAGNOSIS — Z743 Need for continuous supervision: Secondary | ICD-10-CM | POA: Diagnosis not present

## 2021-04-28 DIAGNOSIS — R58 Hemorrhage, not elsewhere classified: Secondary | ICD-10-CM | POA: Diagnosis not present

## 2021-04-28 DIAGNOSIS — R069 Unspecified abnormalities of breathing: Secondary | ICD-10-CM | POA: Diagnosis not present

## 2021-04-28 DIAGNOSIS — I629 Nontraumatic intracranial hemorrhage, unspecified: Secondary | ICD-10-CM

## 2021-04-28 DIAGNOSIS — Z20822 Contact with and (suspected) exposure to covid-19: Secondary | ICD-10-CM | POA: Diagnosis not present

## 2021-04-28 DIAGNOSIS — Z8249 Family history of ischemic heart disease and other diseases of the circulatory system: Secondary | ICD-10-CM

## 2021-04-28 DIAGNOSIS — Z8673 Personal history of transient ischemic attack (TIA), and cerebral infarction without residual deficits: Secondary | ICD-10-CM

## 2021-04-28 DIAGNOSIS — N3289 Other specified disorders of bladder: Secondary | ICD-10-CM | POA: Diagnosis not present

## 2021-04-28 DIAGNOSIS — I2510 Atherosclerotic heart disease of native coronary artery without angina pectoris: Secondary | ICD-10-CM | POA: Diagnosis present

## 2021-04-28 DIAGNOSIS — G319 Degenerative disease of nervous system, unspecified: Secondary | ICD-10-CM | POA: Diagnosis not present

## 2021-04-28 DIAGNOSIS — Z66 Do not resuscitate: Secondary | ICD-10-CM | POA: Diagnosis present

## 2021-04-28 DIAGNOSIS — R531 Weakness: Secondary | ICD-10-CM | POA: Diagnosis not present

## 2021-04-28 DIAGNOSIS — I129 Hypertensive chronic kidney disease with stage 1 through stage 4 chronic kidney disease, or unspecified chronic kidney disease: Secondary | ICD-10-CM | POA: Diagnosis present

## 2021-04-28 DIAGNOSIS — Z888 Allergy status to other drugs, medicaments and biological substances status: Secondary | ICD-10-CM

## 2021-04-28 DIAGNOSIS — R0902 Hypoxemia: Secondary | ICD-10-CM | POA: Diagnosis not present

## 2021-04-28 DIAGNOSIS — G9340 Encephalopathy, unspecified: Secondary | ICD-10-CM | POA: Diagnosis not present

## 2021-04-28 LAB — CBC WITH DIFFERENTIAL/PLATELET
Abs Immature Granulocytes: 0.02 10*3/uL (ref 0.00–0.07)
Basophils Absolute: 0 10*3/uL (ref 0.0–0.1)
Basophils Relative: 1 %
Eosinophils Absolute: 0.3 10*3/uL (ref 0.0–0.5)
Eosinophils Relative: 5 %
HCT: 35.2 % — ABNORMAL LOW (ref 36.0–46.0)
Hemoglobin: 11.3 g/dL — ABNORMAL LOW (ref 12.0–15.0)
Immature Granulocytes: 0 %
Lymphocytes Relative: 20 %
Lymphs Abs: 1.2 10*3/uL (ref 0.7–4.0)
MCH: 31.5 pg (ref 26.0–34.0)
MCHC: 32.1 g/dL (ref 30.0–36.0)
MCV: 98.1 fL (ref 80.0–100.0)
Monocytes Absolute: 0.6 10*3/uL (ref 0.1–1.0)
Monocytes Relative: 10 %
Neutro Abs: 4 10*3/uL (ref 1.7–7.7)
Neutrophils Relative %: 64 %
Platelets: 218 10*3/uL (ref 150–400)
RBC: 3.59 MIL/uL — ABNORMAL LOW (ref 3.87–5.11)
RDW: 14.5 % (ref 11.5–15.5)
WBC: 6.1 10*3/uL (ref 4.0–10.5)
nRBC: 0 % (ref 0.0–0.2)

## 2021-04-28 LAB — RESP PANEL BY RT-PCR (FLU A&B, COVID) ARPGX2
Influenza A by PCR: NEGATIVE
Influenza B by PCR: NEGATIVE
SARS Coronavirus 2 by RT PCR: NEGATIVE

## 2021-04-28 LAB — LACTIC ACID, PLASMA
Lactic Acid, Venous: 1.6 mmol/L (ref 0.5–1.9)
Lactic Acid, Venous: 0.9 mmol/L (ref 0.5–1.9)

## 2021-04-28 LAB — BASIC METABOLIC PANEL
Anion gap: 7 (ref 5–15)
BUN: 16 mg/dL (ref 8–23)
CO2: 26 mmol/L (ref 22–32)
Calcium: 10.5 mg/dL — ABNORMAL HIGH (ref 8.9–10.3)
Chloride: 108 mmol/L (ref 98–111)
Creatinine, Ser: 1.01 mg/dL — ABNORMAL HIGH (ref 0.44–1.00)
GFR, Estimated: 54 mL/min — ABNORMAL LOW (ref >60)
Glucose, Bld: 124 mg/dL — ABNORMAL HIGH (ref 70–99)
Potassium: 3.4 mmol/L — ABNORMAL LOW (ref 3.5–5.1)
Sodium: 141 mmol/L (ref 135–145)

## 2021-04-28 LAB — BRAIN NATRIURETIC PEPTIDE: B Natriuretic Peptide: 57 pg/mL (ref 0.0–100.0)

## 2021-04-28 MED ORDER — HEPARIN (PORCINE) 25000 UT/250ML-% IV SOLN: 700.0000 [IU]/h | INTRAVENOUS | Status: DC

## 2021-04-28 MED ORDER — ATORVASTATIN CALCIUM 40 MG PO TABS
40.0000 mg | ORAL_TABLET | Freq: Every day | ORAL | Status: DC
  Administered 2021-04-29 – 2021-05-05 (×7): 40 mg via ORAL
  Filled 2021-04-28 (×7): qty 1

## 2021-04-28 MED ORDER — IOHEXOL 350 MG/ML SOLN
100.0000 mL | Freq: Once | INTRAVENOUS | Status: AC | PRN
  Administered 2021-04-28: 75 mL via INTRAVENOUS

## 2021-04-28 MED ORDER — VERAPAMIL HCL ER 180 MG PO TBCR
180.0000 mg | EXTENDED_RELEASE_TABLET | Freq: Every day | ORAL | Status: DC
  Administered 2021-04-29 – 2021-05-06 (×8): 180 mg via ORAL
  Filled 2021-04-28 (×10): qty 1

## 2021-04-28 MED ORDER — PANTOPRAZOLE SODIUM 40 MG PO TBEC
40.0000 mg | DELAYED_RELEASE_TABLET | Freq: Every day | ORAL | Status: DC
  Administered 2021-04-29 – 2021-05-05 (×7): 40 mg via ORAL
  Filled 2021-04-28 (×7): qty 1

## 2021-04-28 MED ORDER — POTASSIUM CHLORIDE CRYS ER 20 MEQ PO TBCR
40.0000 meq | EXTENDED_RELEASE_TABLET | Freq: Once | ORAL | Status: AC
  Administered 2021-04-28: 40 meq via ORAL
  Filled 2021-04-28: qty 2

## 2021-04-28 MED ORDER — HEPARIN BOLUS VIA INFUSION: 2000.0000 [IU] | Freq: Once | INTRAVENOUS | Status: DC

## 2021-04-28 MED ORDER — DARIFENACIN HYDROBROMIDE ER 7.5 MG PO TB24
7.5000 mg | ORAL_TABLET | Freq: Every day | ORAL | Status: DC
  Administered 2021-04-29 – 2021-05-05 (×7): 7.5 mg via ORAL
  Filled 2021-04-28 (×9): qty 1

## 2021-04-28 NOTE — ED Notes (Signed)
Placed patient on 2L [place].

## 2021-04-28 NOTE — Progress Notes (Addendum)
ANTICOAGULATION CONSULT NOTE - Initial Consult  Pharmacy Consult for heparin Indication: pulmonary embolus  Allergies  Allergen Reactions   Livalo [Pitavastatin] Other (See Comments)    Causes dizziness   Simvastatin Other (See Comments)    Causes dizziness   Lisinopril     Hallucinations, resolved on ARB    Patient Measurements: Height: 5' (152.4 cm) Weight: 50.8 kg (112 lb) IBW/kg (Calculated) : 45.5 Heparin Dosing Weight: 51kg  Vital Signs: Temp: 98 F (36.7 C) (11/23 1514) Temp Source: Oral (11/23 1514) BP: 136/71 (11/23 1630) Pulse Rate: 66 (11/23 1630)  Labs: Recent Labs    04/28/21 1519  HGB 11.3*  HCT 35.2*  PLT 218  CREATININE 1.01*    Estimated Creatinine Clearance: 27.7 mL/min (A) (by C-G formula based on SCr of 1.01 mg/dL (H)).   Medical History: Past Medical History:  Diagnosis Date   Allergy    Anxiety    Coronary artery disease    LAD 30% followed by 60% stenosis; pressure wire measurement demonstrated no significant gradient; circumflex had 30% stenosis, right coronary artery has 40% stenosed; EF 75-80%   Diverticulosis    GERD (gastroesophageal reflux disease)    Hematuria    Hyperlipidemia    Hypertension    Impaired renal function 04/23/2018   Stroke ([place]) 2003   With a left MCA occlusion   TIA (transient ischemic attack)     Medications:  (Not in a hospital admission)  Scheduled:  Infusions:   Assessment: Pt presented from SNF with low O2 sats today. CT showed PE. Heparin ordered for anticoagulation. Hx SAH in 10/22 from traumatic fall. We will use a smaller bolus here due to bleeding risk.   Addendum: MD will clarify with NS before starting heparin   Hgb 11.3 plt wnl  Goal of Therapy:  Heparin level: 0.3-0.5 Monitor platelets by anticoagulation protocol: Yes   Plan:  F/u heparin plan post neurosurgery consult  Onnie Boer, PharmD, BCIDP, AAHIVP, CPP Infectious Disease Pharmacist 04/28/2021 5:55 PM

## 2021-04-28 NOTE — H&P (Addendum)
TRH H&P   Patient Demographics:    Amanda [name], is a 87 y.o. female  MRN: [id number]   DOB - [birth year]  Admit Date - 04/28/2021  Outpatient Primary MD for the patient is [name], DO  Referring MD/NP/PA: Dr Gilford Raid   Patient coming from: Subacute rehab [facility]  No chief complaint on file.     HPI:    Amanda [name]  is a 87 y.o. female,  past medical history significant for prior stroke, dyslipidemia essential, hypertension, dementia, with recent hospitalization secondary to intracranial hemorrhage from fall, where she was discharged on 10/24 to  rehab, overall patient is poor historian, but able to answer most of the questions, patient presents to ED secondary to complaints of shortness of breath, she was found to have saturation 88% on room air, this has improved on 2 L nasal cannula, patient was diagnosed with pneumonia recently, where she was treated with Levaquin, she denies any pain, but she does report cough, and there is some report of shortness of breath, otherwise she denies any other complaints. -in ED given her hypoxia, CTA chest was obtained which was significant for new tiny scattered pulmonary emboli, and atelectasis, CT head was obtained.  To full anticoagulation with evidence of new intracranial hemorrhage, ED physician discussed with Dr. Glenford Peers on-call, recommendation to admit to [facility] to be evaluated by neurosurgery prior to anticoagulation, [facility] consulted to admit.   Review of systems:    In addition to the HPI above,  No Fever-chills, No Headache, No changes with Vision or hearing, No problems swallowing food or Liquids, She reports shortness of breath No Abdominal pain, No Nausea or Vommitting, Bowel movements are regular, No Blood in stool or Urine, No dysuria, No new skin rashes or bruises, No new joints pains-aches,   No new weakness, tingling, numbness in any extremity, No recent weight gain or loss, No polyuria, polydypsia or polyphagia, No significant Mental Stressors.  A full 10 point Review of Systems was done, except as stated above, all other Review of Systems were negative.   With Past History of the following :    Past Medical History:  Diagnosis Date   Allergy    Anxiety    Coronary artery disease    LAD 30% followed by 60% stenosis; pressure wire measurement demonstrated no significant gradient; circumflex had 30% stenosis, right coronary artery has 40% stenosed; EF 75-80%   Diverticulosis    GERD (gastroesophageal reflux disease)    Hematuria    Hyperlipidemia    Hypertension    Impaired renal function 04/23/2018   Stroke ([place]) 2003   With a left MCA occlusion   TIA (transient ischemic attack)       Past Surgical History:  Procedure Laterality Date   ABDOMINAL HYSTERECTOMY     partial and complete - x  surgeries   APPENDECTOMY  CHOLECYSTECTOMY     COLONOSCOPY N/A 04/19/2019   TI normal, four sessile polyps, 2-6 mm in size, multiple small and large-mouthed diverticula in entire colon, external and internal hemorrhoids, (tubular adenoma, sessile serrated polyp without dysplasia)   ESOPHAGOGASTRODUODENOSCOPY N/A 04/19/2019   mild schatzki ring s/p dilation, multiple gastric polyps (fundic), non-bleeding duodenal diverticulum, benign small bowel, no villous, no dysplasia)   EYE SURGERY Bilateral    cataracts   Hysterectomy-type unspecified     POLYPECTOMY  04/19/2019   Procedure: POLYPECTOMY;  Surgeon: Danie Binder, MD;  Location: [facility];  Service: Endoscopy;;  colon    TONSILLECTOMY        Social History:     Social History   Tobacco Use   Smoking status: Former    Packs/day: 0.25    Years: 3.00    Pack years: 0.75    Types: Cigarettes    Quit date: 06/06/1985    Years since quitting: 35.9   Smokeless tobacco: Never   Tobacco comments:     Approximately 10-pack-year history  Substance Use Topics   Alcohol use: No       Family History :     Family History  Problem Relation Age of Onset   Colon cancer Mother 26       dx on colonoscopy    Cancer Mother        colon   Cancer Father        unsure if cancer  --- tumor on brain   Heart attack Sister    Heart attack Brother    Diabetes Brother    GI problems Son    COPD Sister    Hypertension Brother    Heart disease Brother    Heart attack Brother    Heart disease Brother    Alcohol abuse Brother       Home Medications:   Prior to Admission medications   Medication Sig Start Date End Date Taking? Authorizing Provider  atorvastatin (LIPITOR) 40 MG tablet Take 40 mg by mouth daily. 04/27/21  Yes [provider]  feeding supplement (ENSURE ENLIVE / ENSURE PLUS) LIQD Take 237 mLs by mouth 2 (two) times daily between meals. 03/29/21  Yes Charlynne Cousins, MD  guaiFENesin (MUCINEX) 600 MG 12 hr tablet Take by mouth 2 (two) times daily.   Yes [provider]  iron polysaccharides (NIFEREX) 150 MG capsule Take 150 mg by mouth daily.   Yes [provider]  omeprazole (PRILOSEC) 20 MG capsule TAKE 1 CAPSULE (20 MG TOTAL) BY MOUTH 2 (TWO) TIMES DAILY BEFORE A MEAL. 03/26/20  Yes Annitta Needs, NP  solifenacin (VESICARE) 5 MG tablet Take 1 tablet (5 mg total) by mouth daily. 04/19/21  Yes McKenzie, Candee Furbish, MD  verapamil (CALAN-SR) 180 MG CR tablet Take 1 tablet (180 mg total) by mouth at bedtime. 03/11/21  Yes Gottschalk, Ashly M, DO  VITAMIN D PO 1,000 Int'l Units/day. 07/22/20  Yes [provider]  aspirin EC 81 MG tablet Take 81 mg by mouth daily. Swallow whole.    [provider]  atorvastatin (LIPITOR) 20 MG tablet TAKE 1 TABLET AT BEDTIME. Patient not taking: Reported on 04/28/2021 03/15/21   [name], DO  levofloxacin (LEVAQUIN) 500 MG tablet Take 500 mg by mouth daily. Patient not taking: Reported on  04/28/2021    [provider]  trimethoprim (TRIMPEX) 100 MG tablet Take 1 tablet (100 mg total) by mouth daily. Patient not taking: Reported  on 04/28/2021 04/19/21   Cleon Gustin, MD     Allergies:     Allergies  Allergen Reactions   Livalo [Pitavastatin] Other (See Comments)    Causes dizziness   Simvastatin Other (See Comments)    Causes dizziness   Lisinopril     Hallucinations, resolved on ARB     Physical Exam:   Vitals  Blood pressure (!) 143/69, pulse 65, temperature 98 F (36.7 C), temperature source Oral, resp. rate 17, height 5' (1.524 m), weight 50.8 kg, SpO2 97 %.   1. General well female, laying in bed, no apparent distress  2. Normal affect   pleasant, answering most questions appropriately, she is oriented x2, could not remember the years , she is able to follow commands,  3. No F.N deficits, ALL C.Nerves Intact, Strength 5/5 all 4 extremities, Sensation intact all 4 extremities, Plantars down going.  4. Ears and Eyes appear Normal, Conjunctivae clear, PERRLA. Moist Oral Mucosa.  5. Supple Neck, No JVD, No cervical lymphadenopathy appriciated, No Carotid Bruits.  6. Symmetrical Chest wall movement, Good air movement bilaterally, CTAB.  7. RRR, No Gallops, Rubs or Murmurs, No Parasternal Heave.  8. Positive Bowel Sounds, Abdomen Soft, No tenderness, No organomegaly appriciated,No rebound -guarding or rigidity.  9.  No Cyanosis, Normal Skin Turgor, No Skin Rash or Bruise.  10. Good muscle tone,  joints appear normal , no effusions, Normal ROM.  11. No Palpable Lymph Nodes in Neck or Axillae     Data Review:    CBC Recent Labs  Lab 04/28/21 1519  WBC 6.1  HGB 11.3*  HCT 35.2*  PLT 218  MCV 98.1  MCH 31.5  MCHC 32.1  RDW 14.5  LYMPHSABS 1.2  MONOABS 0.6  EOSABS 0.3  BASOSABS 0.0   ------------------------------------------------------------------------------------------------------------------  Chemistries  Recent  Labs  Lab 04/28/21 1519  NA 141  K 3.4*  CL 108  CO2 26  GLUCOSE 124*  BUN 16  CREATININE 1.01*  CALCIUM 10.5*   ------------------------------------------------------------------------------------------------------------------ estimated creatinine clearance is 27.7 mL/min (A) (by C-G formula based on SCr of 1.01 mg/dL (H)). ------------------------------------------------------------------------------------------------------------------ No results for input(s): TSH, T4TOTAL, T3FREE, THYROIDAB in the last 72 hours.  Invalid input(s): FREET3  Coagulation profile No results for input(s): INR, PROTIME in the last 168 hours. ------------------------------------------------------------------------------------------------------------------- No results for input(s): DDIMER in the last 72 hours. -------------------------------------------------------------------------------------------------------------------  Cardiac Enzymes No results for input(s): CKMB, TROPONINI, MYOGLOBIN in the last 168 hours.  Invalid input(s): CK ------------------------------------------------------------------------------------------------------------------    Component Value Date/Time   BNP 57.0 04/28/2021 1519     ---------------------------------------------------------------------------------------------------------------  Urinalysis    Component Value Date/Time   COLORURINE YELLOW 03/26/2021 0535   APPEARANCEUR Clear 04/16/2021 1053   LABSPEC 1.012 03/26/2021 0535   PHURINE 7.0 03/26/2021 0535   GLUCOSEU Negative 04/16/2021 1053   HGBUR SMALL (A) 03/26/2021 0535   BILIRUBINUR Negative 04/16/2021 [place] 03/26/2021 0535   PROTEINUR Negative 04/16/2021 1053   PROTEINUR NEGATIVE 03/26/2021 0535   UROBILINOGEN 0.2 12/18/2019 1009   UROBILINOGEN 1.0 04/10/2013 2115   NITRITE Negative 04/16/2021 1053   NITRITE NEGATIVE 03/26/2021 0535   LEUKOCYTESUR 1+ (A) 04/16/2021 1053    LEUKOCYTESUR LARGE (A) 03/26/2021 0535    ----------------------------------------------------------------------------------------------------------------   Imaging Results:    CT HEAD WO CONTRAST (5MM)  Result Date: 04/28/2021 CLINICAL DATA:  Cerebral hemorrhage suspected EXAM: CT HEAD WITHOUT CONTRAST TECHNIQUE: Contiguous axial images were obtained from the base of the skull through the vertex without intravenous  contrast. COMPARISON:  03/12/2021. FINDINGS: Brain: Small area of hyperdense material in the anterior right frontal lobe (series 2, image 14). Additional previously noted hyperdense areas are no longer seen. No acute infarct. No mass, mass effect, or midline shift. Slight increase in size of lateral and third ventricles compared to 03/12/2021. Periventricular white matter changes, likely the sequela of chronic small vessel ischemic disease. Vascular: Contrast is noted within the vascular system after prior contrasted study. Skull: Normal. Negative for fracture or focal lesion. Sinuses/Orbits: Mild mucosal thickening in the ethmoid air cells. Status post bilateral lens replacements. Other: The mastoids are well aerated. IMPRESSION: 1. Hyperdensity in the anterior right frontal lobe. Although the patient received intravenous contrast today, this is felt most likely to be acute hemorrhage. This is in an area that previously demonstrated parenchymal hemorrhage/contusion, although that hemorrhage has resolved, as has hemorrhage in the left anterior frontal lobe. No mass effect or midline shift. 2. Slight increase in size of the ventricular system, without definite hydrocephalus. Attention on follow-up. These results were called by telephone at the time of interpretation on 04/28/2021 at 7:11 pm to provider Dr. Gilford Raid, Who verbally acknowledged these results. Electronically Signed   By: Merilyn Baba M.D.   On: 04/28/2021 19:13   CT Angio Chest PE W and/or Wo Contrast  Result Date:  04/28/2021 CLINICAL DATA:  Low oxygen saturation question pulmonary embolism, recent pneumonia EXAM: CT ANGIOGRAPHY CHEST WITH CONTRAST TECHNIQUE: Multidetector CT imaging of the chest was performed using the standard protocol during bolus administration of intravenous contrast. Multiplanar CT image reconstructions and MIPs were obtained to evaluate the vascular anatomy. CONTRAST:  62mL OMNIPAQUE IOHEXOL 350 MG/ML SOLN COMPARISON:  None FINDINGS: Cardiovascular: Atherosclerotic calcifications aorta, coronary arteries, and proximal great vessels. Aberrant origin of the RIGHT subclavian artery. No evidence of aortic aneurysm or dissection. Heart appears mildly enlarged. Minimal pericardial fluid. Pulmonary arteries adequately opacified. Several tiny filling defects are seen within RIGHT upper and RIGHT middle lobe pulmonary arteries. In addition, eccentric filling defects are seen within the LEFT lower lobe pulmonary artery and the RIGHT upper lobe pulmonary artery. Findings are consistent with pulmonary embolism, though this may represent a combination of acute and old emboli. Mediastinum/Nodes: Base of cervical region normal appearance. Esophagus unremarkable. No thoracic adenopathy. Lungs/Pleura: Dependent atelectasis bilaterally. No pulmonary infiltrate, pleural effusion, or pneumothorax. Upper Abdomen: Visualized upper abdomen unremarkable Musculoskeletal: Osseous structures normal appearance. Review of the MIP images confirms the above findings. IMPRESSION: Few scattered tiny pulmonary emboli are identified with additional eccentric filling defects which may represent sequela of new or old pulmonary emboli. Scattered atherosclerotic calcifications including coronary arteries. Aberrant origin of the RIGHT subclavian artery. Dependent atelectasis BILATERAL lower lobes. Aortic Atherosclerosis (ICD10-I70.0). Critical Value/emergent results were called by telephone at the time of interpretation on 04/28/2021 at 5:26  pm to provider Isla Pence MD, who verbally acknowledged these results. Electronically Signed   By: Lavonia Dana M.D.   On: 04/28/2021 17:26   DG Chest Port 1 View  Result Date: 04/28/2021 CLINICAL DATA:  Shortness of breath EXAM: PORTABLE CHEST 1 VIEW COMPARISON:  04/09/2013, 02/22/2016 report FINDINGS: No focal opacity or pleural effusion. Cardiomediastinal silhouette within normal limits. Aortic atherosclerosis. No pneumothorax. IMPRESSION: No active disease. Electronically Signed   By: Donavan Foil M.D.   On: 04/28/2021 15:32      Assessment & Plan:    Principal Problem:   Intracranial hemorrhage (HCC) Active Problems:   Essential hypertension   GERD   CKD (chronic kidney  disease) stage 3, GFR 30-59 ml/min (HCC)   Subarachnoid hemorrhage (HCC)  Intracranial hemorrhage -Recent admission due to similar problem, was with conservative management,. -Repeat CT head this admission showing evidence of new intracranial hemorrhage, ED physician discussed with Dr. Glenford Peers from neurosurgery, who requested admission to [facility] to be evaluated by neurosurgery prior decision about full anticoagulation in the setting of new PE.   Pulmonary embolism -This is new diagnosis, evidence of few scattered tiny pulmonary emboli extending filling defects. -for now  will hold on full anticoagulation given evidence of new intracranial hemorrhage pending further neurosurgical recommendation.  Especially those pulmonary emboli appears to be few and tiny in size. -if patient deemed high risk for anticoagulation by neuro surgery, then would proceed with venous Dopplers, and if positive then will consider IVC filter.  Acute hypoxic respiratory failure -Due to above, as well as evidence of atelectasis on imaging, will encourage to use incentive spirometer.  Hyperlipidemia -Continue with statin  GERD - Continue with PPI  Hypokalemia -Mild, repleted  Hypertension -continue with  verapamil  DVT Prophylaxis will avoid SCDs until DVT is ruled out.  AM Labs Ordered, also please review Full Orders  Family Communication: Admission, patients condition and plan of care including tests being ordered have been discussed with the patient and daughter by phone who indicate understanding and agree with the plan and Code Status.  Code Status DNR, confirmed by daughter  Likely DC to  back to SNF  Condition GUARDED    Consults called: ed called dr Glenford Peers from neurosurgery    Admission status: inpatient    Time spent in minutes : 60 minutes   Phillips Climes M.D on 04/28/2021 at 9:14 PM   [facility] - Office  [phone]

## 2021-04-28 NOTE — ED Provider Notes (Signed)
[facility] EMERGENCY DEPARTMENT Provider Note   CSN: [id number] Arrival date & time: 04/28/21  1505     History No chief complaint on file.   Amanda [name] is a 87 y.o. female.  Pt presents to the ED today with low oxygen from the SN[facility].  Pt is from [facility].  Per EMS, her O2 sat was 88% over night.  Her sat was 91% on RA when EMS arrived.  She was put on 2L.  Pt was diagnosed with pna recently.  It is unclear when or where.  She was or is on Levaquin.  Pt has dementia and is a poor historian.  She was not aware she had pna.  She denies any pain.  She does have a cough.      Past Medical History:  Diagnosis Date   Allergy    Anxiety    Coronary artery disease    LAD 30% followed by 60% stenosis; pressure wire measurement demonstrated no significant gradient; circumflex had 30% stenosis, right coronary artery has 40% stenosed; EF 75-80%   Diverticulosis    GERD (gastroesophageal reflux disease)    Hematuria    Hyperlipidemia    Hypertension    Impaired renal function 04/23/2018   Stroke ([place]) 2003   With a left MCA occlusion   TIA (transient ischemic attack)     Patient Active Problem List   Diagnosis Date Noted   Subarachnoid hemorrhage ([place]) 03/12/2021   Subarachnoid hematoma 03/11/2021   Cerebral atrophy ([place]) 01/12/2021   AMS (altered mental status) 12/31/2020   Dysphagia 07/28/2020   Abdominal pain 07/28/2020   Recurrent UTI 03/19/2020   Leg pain, posterior, right 10/08/2019   OAB (overactive bladder) 09/18/2019   Chronic cystitis with hematuria 06/19/2019   Hematochezia 02/13/2019   Melena 02/13/2019   Anemia 02/13/2019   Constipation 02/13/2019   Absolute anemia 01/31/2019   Disorder of phosphorus metabolism 01/31/2019   CKD (chronic kidney disease) stage 3, GFR 30-59 ml/min (HCC) 07/20/2018   Postural dizziness 04/23/2018   Osteoporosis 01/15/2018   Transient cerebral ischemia 04/10/2013   Hyperlipidemia 09/22/2008   Essential hypertension  09/22/2008   Coronary atherosclerosis 09/22/2008   History of CVA (cerebrovascular accident) 09/22/2008   GERD 09/22/2008   Diverticulosis of colon 09/22/2008   Cerebral infarction due to unspecified occlusion or stenosis of unspecified cerebral artery ([place]) 09/22/2008    Past Surgical History:  Procedure Laterality Date   ABDOMINAL HYSTERECTOMY     partial and complete - x  surgeries   APPENDECTOMY     CHOLECYSTECTOMY     COLONOSCOPY N/A 04/19/2019   TI normal, four sessile polyps, 2-6 mm in size, multiple small and large-mouthed diverticula in entire colon, external and internal hemorrhoids, (tubular adenoma, sessile serrated polyp without dysplasia)   ESOPHAGOGASTRODUODENOSCOPY N/A 04/19/2019   mild schatzki ring s/p dilation, multiple gastric polyps (fundic), non-bleeding duodenal diverticulum, benign small bowel, no villous, no dysplasia)   EYE SURGERY Bilateral    cataracts   Hysterectomy-type unspecified     POLYPECTOMY  04/19/2019   Procedure: POLYPECTOMY;  Surgeon: Danie Binder, MD;  Location: [facility] ENDO SUITE;  Service: Endoscopy;;  colon    TONSILLECTOMY       OB History   No obstetric history on file.     Family History  Problem Relation Age of Onset   Colon cancer Mother 62       dx on colonoscopy    Cancer Mother  colon   Cancer Father        unsure if cancer  --- tumor on brain   Heart attack Sister    Heart attack Brother    Diabetes Brother    GI problems Son    COPD Sister    Hypertension Brother    Heart disease Brother    Heart attack Brother    Heart disease Brother    Alcohol abuse Brother     Social History   Tobacco Use   Smoking status: Former    Packs/day: 0.25    Years: 3.00    Pack years: 0.75    Types: Cigarettes    Quit date: 06/06/1985    Years since quitting: 35.9   Smokeless tobacco: Never   Tobacco comments:    Approximately 10-pack-year history  Vaping Use   Vaping Use: Never used  Substance Use Topics    Alcohol use: No   Drug use: No    Home Medications Prior to Admission medications   Medication Sig Start Date End Date Taking? Authorizing Provider  atorvastatin (LIPITOR) 40 MG tablet Take 40 mg by mouth daily. 04/27/21  Yes [provider]  feeding supplement (ENSURE ENLIVE / ENSURE PLUS) LIQD Take 237 mLs by mouth 2 (two) times daily between meals. 03/29/21  Yes Charlynne Cousins, MD  omeprazole (PRILOSEC) 20 MG capsule TAKE 1 CAPSULE (20 MG TOTAL) BY MOUTH 2 (TWO) TIMES DAILY BEFORE A MEAL. 03/26/20  Yes Annitta Needs, NP  solifenacin (VESICARE) 5 MG tablet Take 1 tablet (5 mg total) by mouth daily. 04/19/21  Yes McKenzie, Candee Furbish, MD  verapamil (CALAN-SR) 180 MG CR tablet Take 1 tablet (180 mg total) by mouth at bedtime. 03/11/21  Yes Gottschalk, Ashly M, DO  VITAMIN D PO 1,000 Int'l Units/day. 07/22/20  Yes [provider]  aspirin EC 81 MG tablet Take 81 mg by mouth daily. Swallow whole.    [provider]  atorvastatin (LIPITOR) 20 MG tablet TAKE 1 TABLET AT BEDTIME. Patient not taking: Reported on 04/28/2021 03/15/21   [name], DO  guaiFENesin (MUCINEX) 600 MG 12 hr tablet Take by mouth 2 (two) times daily.    [provider]  iron polysaccharides (NIFEREX) 150 MG capsule Take 150 mg by mouth daily.    [provider]  levofloxacin (LEVAQUIN) 500 MG tablet Take 500 mg by mouth daily.    [provider]  trimethoprim (TRIMPEX) 100 MG tablet Take 1 tablet (100 mg total) by mouth daily. 04/19/21   McKenzie, Candee Furbish, MD    Allergies    Livalo [pitavastatin], Simvastatin, and Lisinopril  Review of Systems   Review of Systems  Unable to perform ROS: Dementia  Respiratory:  Positive for cough.   All other systems reviewed and are negative.  Physical Exam Updated Vital Signs BP (!) 143/69   Pulse 65   Temp 98 F (36.7 C) (Oral)   Resp 17   Ht 5' (1.524 m)   Wt 50.8 kg   SpO2 97%   BMI 21.87 kg/m    Physical Exam Vitals and nursing note reviewed.  Constitutional:      Appearance: Normal appearance.  HENT:     Head: Normocephalic and atraumatic.     Right Ear: External ear normal.     Left Ear: External ear normal.     Nose: Nose normal.     Mouth/Throat:     Mouth: Mucous membranes are moist.  Pharynx: Oropharynx is clear.  Eyes:     Extraocular Movements: Extraocular movements intact.     Conjunctiva/sclera: Conjunctivae normal.     Pupils: Pupils are equal, round, and reactive to light.  Cardiovascular:     Rate and Rhythm: Normal rate and regular rhythm.     Pulses: Normal pulses.     Heart sounds: Normal heart sounds.  Pulmonary:     Effort: Pulmonary effort is normal.     Breath sounds: Normal breath sounds.  Abdominal:     General: Abdomen is flat. Bowel sounds are normal.     Palpations: Abdomen is soft.  Musculoskeletal:        General: Normal range of motion.     Cervical back: Normal range of motion and neck supple.  Skin:    General: Skin is warm.     Capillary Refill: Capillary refill takes less than 2 seconds.  Neurological:     General: No focal deficit present.     Mental Status: She is alert and oriented to person, place, and time.  Psychiatric:        Mood and Affect: Mood normal.        Behavior: Behavior normal.    ED Results / Procedures / Treatments   Labs (all labs ordered are listed, but only abnormal results are displayed) Labs Reviewed  BASIC METABOLIC PANEL - Abnormal; Notable for the following components:      Result Value   Potassium 3.4 (*)    Glucose, Bld 124 (*)    Creatinine, Ser 1.01 (*)    Calcium 10.5 (*)    GFR, Estimated 54 (*)    All other components within normal limits  CBC WITH DIFFERENTIAL/PLATELET - Abnormal; Notable for the following components:   RBC 3.59 (*)    Hemoglobin 11.3 (*)    HCT 35.2 (*)    All other components within normal limits  RESP PANEL BY RT-PCR (FLU A&B, COVID) ARPGX2  CULTURE, BLOOD  (ROUTINE X 2)  CULTURE, BLOOD (ROUTINE X 2)  BRAIN NATRIURETIC PEPTIDE  LACTIC ACID, PLASMA  LACTIC ACID, PLASMA  CBC  CBC    EKG EKG Interpretation  Date/Time:  Wednesday April 28 2021 15:13:47 EST Ventricular Rate:  67 PR Interval:  165 QRS Duration: 137 QT Interval:  443 QTC Calculation: 468 R Axis:   28 Text Interpretation: Sinus rhythm Right bundle branch block Inferior infarct, old No significant change since last tracing Confirmed by Isla Pence [phone]) on 04/28/2021 3:18:46 PM  Radiology CT HEAD WO CONTRAST (5MM)  Result Date: 04/28/2021 CLINICAL DATA:  Cerebral hemorrhage suspected EXAM: CT HEAD WITHOUT CONTRAST TECHNIQUE: Contiguous axial images were obtained from the base of the skull through the vertex without intravenous contrast. COMPARISON:  03/12/2021. FINDINGS: Brain: Small area of hyperdense material in the anterior right frontal lobe (series 2, image 14). Additional previously noted hyperdense areas are no longer seen. No acute infarct. No mass, mass effect, or midline shift. Slight increase in size of lateral and third ventricles compared to 03/12/2021. Periventricular white matter changes, likely the sequela of chronic small vessel ischemic disease. Vascular: Contrast is noted within the vascular system after prior contrasted study. Skull: Normal. Negative for fracture or focal lesion. Sinuses/Orbits: Mild mucosal thickening in the ethmoid air cells. Status post bilateral lens replacements. Other: The mastoids are well aerated. IMPRESSION: 1. Hyperdensity in the anterior right frontal lobe. Although the patient received intravenous contrast today, this is felt most likely to be acute hemorrhage. This  is in an area that previously demonstrated parenchymal hemorrhage/contusion, although that hemorrhage has resolved, as has hemorrhage in the left anterior frontal lobe. No mass effect or midline shift. 2. Slight increase in size of the ventricular system, without  definite hydrocephalus. Attention on follow-up. These results were called by telephone at the time of interpretation on 04/28/2021 at 7:11 pm to provider Dr. Gilford Raid, Who verbally acknowledged these results. Electronically Signed   By: Merilyn Baba M.D.   On: 04/28/2021 19:13   CT Angio Chest PE W and/or Wo Contrast  Result Date: 04/28/2021 CLINICAL DATA:  Low oxygen saturation question pulmonary embolism, recent pneumonia EXAM: CT ANGIOGRAPHY CHEST WITH CONTRAST TECHNIQUE: Multidetector CT imaging of the chest was performed using the standard protocol during bolus administration of intravenous contrast. Multiplanar CT image reconstructions and MIPs were obtained to evaluate the vascular anatomy. CONTRAST:  22mL OMNIPAQUE IOHEXOL 350 MG/ML SOLN COMPARISON:  None FINDINGS: Cardiovascular: Atherosclerotic calcifications aorta, coronary arteries, and proximal great vessels. Aberrant origin of the RIGHT subclavian artery. No evidence of aortic aneurysm or dissection. Heart appears mildly enlarged. Minimal pericardial fluid. Pulmonary arteries adequately opacified. Several tiny filling defects are seen within RIGHT upper and RIGHT middle lobe pulmonary arteries. In addition, eccentric filling defects are seen within the LEFT lower lobe pulmonary artery and the RIGHT upper lobe pulmonary artery. Findings are consistent with pulmonary embolism, though this may represent a combination of acute and old emboli. Mediastinum/Nodes: Base of cervical region normal appearance. Esophagus unremarkable. No thoracic adenopathy. Lungs/Pleura: Dependent atelectasis bilaterally. No pulmonary infiltrate, pleural effusion, or pneumothorax. Upper Abdomen: Visualized upper abdomen unremarkable Musculoskeletal: Osseous structures normal appearance. Review of the MIP images confirms the above findings. IMPRESSION: Few scattered tiny pulmonary emboli are identified with additional eccentric filling defects which may represent sequela of  new or old pulmonary emboli. Scattered atherosclerotic calcifications including coronary arteries. Aberrant origin of the RIGHT subclavian artery. Dependent atelectasis BILATERAL lower lobes. Aortic Atherosclerosis (ICD10-I70.0). Critical Value/emergent results were called by telephone at the time of interpretation on 04/28/2021 at 5:26 pm to provider Isla Pence MD, who verbally acknowledged these results. Electronically Signed   By: Lavonia Dana M.D.   On: 04/28/2021 17:26   DG Chest Port 1 View  Result Date: 04/28/2021 CLINICAL DATA:  Shortness of breath EXAM: PORTABLE CHEST 1 VIEW COMPARISON:  04/09/2013, 02/22/2016 report FINDINGS: No focal opacity or pleural effusion. Cardiomediastinal silhouette within normal limits. Aortic atherosclerosis. No pneumothorax. IMPRESSION: No active disease. Electronically Signed   By: Donavan Foil M.D.   On: 04/28/2021 15:32    Procedures Procedures   Medications Ordered in ED Medications  iohexol (OMNIPAQUE) 350 MG/ML injection 100 mL (75 mLs Intravenous Contrast Given 04/28/21 1634)    ED Course  I have reviewed the triage vital signs and the nursing notes.  Pertinent labs & imaging results that were available during my care of the patient were reviewed by me and considered in my medical decision making (see chart for details).    MDM Rules/Calculators/A&P                           Covid/flu neg.  CXR clear.    While here, her O2 sat dropped to 88% while awake and so she was put on 2L oxygen via [place] and her oxygen level is now in the mid-90s.  Due to hypoxia, CT chest was done which showed multiple PEs.    Pt had a head bleed in  October, so I spoke initially with Dr. Reatha Armour (NS) then with Dr. Glenford Peers (NS).  We did a repeat CT which shows a hemorrhage in the same place.  The radiologist thinks this is a new bleed.  Dr. Glenford Peers recommended admission to [facility] and holding off on heparin until she gets there so he can deal with any potential  complications.  Pt's daughter Amanda [name] [facility]) notified.  Pt d/w Dr. Waldron Labs (triad) for admission.  CRITICAL CARE Performed by: Isla Pence   Total critical care time: 30 minutes  Critical care time was exclusive of separately billable procedures and treating other patients.  Critical care was necessary to treat or prevent imminent or life-threatening deterioration.  Critical care was time spent personally by me on the following activities: development of treatment plan with patient and/or surrogate as well as nursing, discussions with consultants, evaluation of patient's response to treatment, examination of patient, obtaining history from patient or surrogate, ordering and performing treatments and interventions, ordering and review of laboratory studies, ordering and review of radiographic studies, pulse oximetry and re-evaluation of patient's condition.    Final Clinical Impression(s) / ED Diagnoses Final diagnoses:  Multiple subsegmental pulmonary emboli without acute cor pulmonale (HCC)  Intraparenchymal hematoma of brain, right, with unknown loss of consciousness status, initial encounter    Rx / DC Orders ED Discharge Orders     None        Isla Pence, MD 04/28/21 2324

## 2021-04-28 NOTE — ED Notes (Signed)
Lab notified to draw blood cultures and lactic

## 2021-04-28 NOTE — Consult Note (Addendum)
Reason for Consult: Intracranial hemorrhage with PE Referring Physician: Isla Pence, MD   HPI: Amanda [name] is an 87 y.o. female with a PmHx significant for dementia, CVA, HLD, HTN who was hospitalized 03/11/2021 - 03/29/2021 due to a significant fall with facial contusion and acute bifrontal hemorrhage contusions and small subarachnoid hemorrhage. She was ultimately discharged to a SNF for convalescence. She presented to the [facility] today via EMS due to complaints O2 sats in the upper 80's overnight. She was apparently recently diagnosed with PNA. The patient is unaware of this and is cooperative with questioning, but is a poor historian. While in the ED, she was sent for a CTA chest which revealed PE. Due to her recent h/o ICH, a repeat CTH was performed prior to starting anticoagulation. Her CTH was concerning for acute hemorrhage in the anterior right frontal lobe. It was requested that the patient be transferred to [facility] for close neurological monitoring in the setting of anticoagulation for PE.   Past Medical History:  Diagnosis Date   Allergy    Anxiety    Coronary artery disease    LAD 30% followed by 60% stenosis; pressure wire measurement demonstrated no significant gradient; circumflex had 30% stenosis, right coronary artery has 40% stenosed; EF 75-80%   Diverticulosis    GERD (gastroesophageal reflux disease)    Hematuria    Hyperlipidemia    Hypertension    Impaired renal function 04/23/2018   Stroke ([place]) 2003   With a left MCA occlusion   TIA (transient ischemic attack)     Past Surgical History:  Procedure Laterality Date   ABDOMINAL HYSTERECTOMY     partial and complete - x  surgeries   APPENDECTOMY     CHOLECYSTECTOMY     COLONOSCOPY N/A 04/19/2019   TI normal, four sessile polyps, 2-6 mm in size, multiple small and large-mouthed diverticula in entire colon, external and internal hemorrhoids, (tubular adenoma, sessile serrated polyp without dysplasia)    ESOPHAGOGASTRODUODENOSCOPY N/A 04/19/2019   mild schatzki ring s/p dilation, multiple gastric polyps (fundic), non-bleeding duodenal diverticulum, benign small bowel, no villous, no dysplasia)   EYE SURGERY Bilateral    cataracts   Hysterectomy-type unspecified     POLYPECTOMY  04/19/2019   Procedure: POLYPECTOMY;  Surgeon: Danie Binder, MD;  Location: [facility];  Service: Endoscopy;;  colon    TONSILLECTOMY      Family History  Problem Relation Age of Onset   Colon cancer Mother 55       dx on colonoscopy    Cancer Mother        colon   Cancer Father        unsure if cancer  --- tumor on brain   Heart attack Sister    Heart attack Brother    Diabetes Brother    GI problems Son    COPD Sister    Hypertension Brother    Heart disease Brother    Heart attack Brother    Heart disease Brother    Alcohol abuse Brother     Social History:  reports that she quit smoking about 35 years ago. Her smoking use included cigarettes. She has a 0.75 pack-year smoking history. She has never used smokeless tobacco. She reports that she does not drink alcohol and does not use drugs.  Allergies:  Allergies  Allergen Reactions   Livalo [Pitavastatin] Other (See Comments)    Causes dizziness   Simvastatin Other (See Comments)    Causes dizziness  Lisinopril     Hallucinations, resolved on ARB    Medications: I have reviewed the patient's current medications.  Results for orders placed or performed during the hospital encounter of 04/28/21 (from the past 48 hour(s))  Resp Panel by RT-PCR (Flu A&B, Covid) Nasopharyngeal Swab     Status: None   Collection Time: 04/28/21  3:17 PM   Specimen: Nasopharyngeal Swab; Nasopharyngeal(NP) swabs in vial transport medium  Result Value Ref Range   SARS Coronavirus 2 by RT PCR NEGATIVE NEGATIVE    Comment: (NOTE) SARS-CoV-2 target nucleic acids are NOT DETECTED.  The SARS-CoV-2 RNA is generally detectable in upper respiratory specimens  during the acute phase of infection. The lowest concentration of SARS-CoV-2 viral copies this assay can detect is 138 copies/mL. A negative result does not preclude SARS-Cov-2 infection and should not be used as the sole basis for treatment or other patient management decisions. A negative result may occur with  improper specimen collection/handling, submission of specimen other than nasopharyngeal swab, presence of viral mutation(s) within the areas targeted by this assay, and inadequate number of viral copies(<138 copies/mL). A negative result must be combined with clinical observations, patient history, and epidemiological information. The expected result is Negative.  Fact Sheet for Patients:  EntrepreneurPulse.com.au  Fact Sheet for Healthcare Providers:  IncredibleEmployment.be  This test is no t yet approved or cleared by the [place] FDA and  has been authorized for detection and/or diagnosis of SARS-CoV-2 by FDA under an Emergency Use Authorization (EUA). This EUA will remain  in effect (meaning this test can be used) for the duration of the COVID-19 declaration under Section 564(b)(1) of the Act, 21 U.S.C.section 360bbb-3(b)(1), unless the authorization is terminated  or revoked sooner.       Influenza A by PCR NEGATIVE NEGATIVE   Influenza B by PCR NEGATIVE NEGATIVE    Comment: (NOTE) The Xpert Xpress SARS-CoV-2/FLU/RSV plus assay is intended as an aid in the diagnosis of influenza from Nasopharyngeal swab specimens and should not be used as a sole basis for treatment. Nasal washings and aspirates are unacceptable for Xpert Xpress SARS-CoV-2/FLU/RSV testing.  Fact Sheet for Patients: EntrepreneurPulse.com.au  Fact Sheet for Healthcare Providers: IncredibleEmployment.be  This test is not yet approved or cleared by the [place] FDA and has been authorized for detection and/or diagnosis  of SARS-CoV-2 by FDA under an Emergency Use Authorization (EUA). This EUA will remain in effect (meaning this test can be used) for the duration of the COVID-19 declaration under Section 564(b)(1) of the Act, 21 U.S.C. section 360bbb-3(b)(1), unless the authorization is terminated or revoked.  Performed at [facility], [address] [place], [place] [address]   Basic metabolic panel     Status: Abnormal   Collection Time: 04/28/21  3:19 PM  Result Value Ref Range   Sodium 141 135 - 145 mmol/L   Potassium 3.4 (L) 3.5 - 5.1 mmol/L   Chloride 108 98 - 111 mmol/L   CO2 26 22 - 32 mmol/L   Glucose, Bld 124 (H) 70 - 99 mg/dL    Comment: Glucose reference range applies only to samples taken after fasting for at least 8 hours.   BUN 16 8 - 23 mg/dL   Creatinine, Ser 1.01 (H) 0.44 - 1.00 mg/dL   Calcium 10.5 (H) 8.9 - 10.3 mg/dL   GFR, Estimated 54 (L) >60 mL/min    Comment: (NOTE) Calculated using the CKD-EPI Creatinine Equation (2021)    Anion gap 7 5 - 15  Comment: Performed at [facility], [address] [place], [place] [address]  Brain natriuretic peptide     Status: None   Collection Time: 04/28/21  3:19 PM  Result Value Ref Range   B Natriuretic Peptide 57.0 0.0 - 100.0 pg/mL    Comment: Performed at [facility], [address] [place], [place] [address]  CBC with Differential     Status: Abnormal   Collection Time: 04/28/21  3:19 PM  Result Value Ref Range   WBC 6.1 4.0 - 10.5 K/uL   RBC 3.59 (L) 3.87 - 5.11 MIL/uL   Hemoglobin 11.3 (L) 12.0 - 15.0 g/dL   HCT 35.2 (L) 36.0 - 46.0 %   MCV 98.1 80.0 - 100.0 fL   MCH 31.5 26.0 - 34.0 pg   MCHC 32.1 30.0 - 36.0 g/dL   RDW 14.5 11.5 - 15.5 %   Platelets 218 150 - 400 K/uL   nRBC 0.0 0.0 - 0.2 %   Neutrophils Relative % 64 %   Neutro Abs 4.0 1.7 - 7.7 K/uL   Lymphocytes Relative 20 %   Lymphs Abs 1.2 0.7 - 4.0 K/uL   Monocytes Relative 10 %   Monocytes Absolute 0.6 0.1 - 1.0 K/uL   Eosinophils Relative 5 %    Eosinophils Absolute 0.3 0.0 - 0.5 K/uL   Basophils Relative 1 %   Basophils Absolute 0.0 0.0 - 0.1 K/uL   Immature Granulocytes 0 %   Abs Immature Granulocytes 0.02 0.00 - 0.07 K/uL    Comment: Performed at [facility], [address] [place], [place] [address]  Culture, blood (routine x 2)     Status: None (Preliminary result)   Collection Time: 04/28/21  3:56 PM   Specimen: BLOOD RIGHT ARM  Result Value Ref Range   Specimen Description BLOOD RIGHT ARM    Special Requests      BOTTLES DRAWN AEROBIC AND ANAEROBIC Blood Culture adequate volume Performed at [facility], [address], [place], [place] [address]    Culture PENDING    Report Status PENDING   Culture, blood (routine x 2)     Status: None (Preliminary result)   Collection Time: 04/28/21  3:56 PM   Specimen: BLOOD LEFT HAND  Result Value Ref Range   Specimen Description BLOOD LEFT HAND    Special Requests      BOTTLES DRAWN AEROBIC AND ANAEROBIC Blood Culture adequate volume Performed at [facility], [address], [place], [place] [address]    Culture PENDING    Report Status PENDING   Lactic acid, plasma     Status: None   Collection Time: 04/28/21  3:56 PM  Result Value Ref Range   Lactic Acid, Venous 1.6 0.5 - 1.9 mmol/L    Comment: Performed at [facility], [address] [place], [place] [address]  Lactic acid, plasma     Status: None   Collection Time: 04/28/21  5:52 PM  Result Value Ref Range   Lactic Acid, Venous 0.9 0.5 - 1.9 mmol/L    Comment: Performed at [facility], [address] [place], [place] [address]    CT HEAD WO CONTRAST (5MM)  Result Date: 04/28/2021 CLINICAL DATA:  Cerebral hemorrhage suspected EXAM: CT HEAD WITHOUT CONTRAST TECHNIQUE: Contiguous axial images were obtained from the base of the skull through the vertex without intravenous contrast. COMPARISON:  03/12/2021. FINDINGS: Brain: Small area of hyperdense material in the anterior right frontal lobe (series 2, image 14).  Additional previously noted hyperdense  areas are no longer seen. No acute infarct. No mass, mass effect, or midline shift. Slight increase in size of lateral and third ventricles compared to 03/12/2021. Periventricular white matter changes, likely the sequela of chronic small vessel ischemic disease. Vascular: Contrast is noted within the vascular system after prior contrasted study. Skull: Normal. Negative for fracture or focal lesion. Sinuses/Orbits: Mild mucosal thickening in the ethmoid air cells. Status post bilateral lens replacements. Other: The mastoids are well aerated. IMPRESSION: 1. Hyperdensity in the anterior right frontal lobe. Although the patient received intravenous contrast today, this is felt most likely to be acute hemorrhage. This is in an area that previously demonstrated parenchymal hemorrhage/contusion, although that hemorrhage has resolved, as has hemorrhage in the left anterior frontal lobe. No mass effect or midline shift. 2. Slight increase in size of the ventricular system, without definite hydrocephalus. Attention on follow-up. These results were called by telephone at the time of interpretation on 04/28/2021 at 7:11 pm to provider Dr. Gilford Raid, Who verbally acknowledged these results. Electronically Signed   By: Merilyn Baba M.D.   On: 04/28/2021 19:13   CT Angio Chest PE W and/or Wo Contrast  Result Date: 04/28/2021 CLINICAL DATA:  Low oxygen saturation question pulmonary embolism, recent pneumonia EXAM: CT ANGIOGRAPHY CHEST WITH CONTRAST TECHNIQUE: Multidetector CT imaging of the chest was performed using the standard protocol during bolus administration of intravenous contrast. Multiplanar CT image reconstructions and MIPs were obtained to evaluate the vascular anatomy. CONTRAST:  9mL OMNIPAQUE IOHEXOL 350 MG/ML SOLN COMPARISON:  None FINDINGS: Cardiovascular: Atherosclerotic calcifications aorta, coronary arteries, and proximal great vessels. Aberrant origin of the RIGHT  subclavian artery. No evidence of aortic aneurysm or dissection. Heart appears mildly enlarged. Minimal pericardial fluid. Pulmonary arteries adequately opacified. Several tiny filling defects are seen within RIGHT upper and RIGHT middle lobe pulmonary arteries. In addition, eccentric filling defects are seen within the LEFT lower lobe pulmonary artery and the RIGHT upper lobe pulmonary artery. Findings are consistent with pulmonary embolism, though this may represent a combination of acute and old emboli. Mediastinum/Nodes: Base of cervical region normal appearance. Esophagus unremarkable. No thoracic adenopathy. Lungs/Pleura: Dependent atelectasis bilaterally. No pulmonary infiltrate, pleural effusion, or pneumothorax. Upper Abdomen: Visualized upper abdomen unremarkable Musculoskeletal: Osseous structures normal appearance. Review of the MIP images confirms the above findings. IMPRESSION: Few scattered tiny pulmonary emboli are identified with additional eccentric filling defects which may represent sequela of new or old pulmonary emboli. Scattered atherosclerotic calcifications including coronary arteries. Aberrant origin of the RIGHT subclavian artery. Dependent atelectasis BILATERAL lower lobes. Aortic Atherosclerosis (ICD10-I70.0). Critical Value/emergent results were called by telephone at the time of interpretation on 04/28/2021 at 5:26 pm to provider Isla Pence MD, who verbally acknowledged these results. Electronically Signed   By: Lavonia Dana M.D.   On: 04/28/2021 17:26   DG Chest Port 1 View  Result Date: 04/28/2021 CLINICAL DATA:  Shortness of breath EXAM: PORTABLE CHEST 1 VIEW COMPARISON:  04/09/2013, 02/22/2016 report FINDINGS: No focal opacity or pleural effusion. Cardiomediastinal silhouette within normal limits. Aortic atherosclerosis. No pneumothorax. IMPRESSION: No active disease. Electronically Signed   By: Donavan Foil M.D.   On: 04/28/2021 15:32    ROS: Per HPI Blood pressure (!)  161/82, pulse 76, temperature 97.6 F (36.4 C), temperature source Oral, resp. rate 18, height 5' (1.524 m), weight 50.8 kg, SpO2 92 %.  Physical Exam: Patient is awake, A/O X 3, conversant, and in good spirits. She is  in NAD and VSS. Doing well. Speech  is fluent and appropriate. MAEW with good strength. Sensation to light touch is intact. PERLA, EOMI. CNs grossly intact.    Assessment/Plan: 87 y.o. female with recent h/o of acute bifrontal hemorrhagic contusions and subarachnoid hemorrhage. Today, she was found to have a PE and concurrent acute hemorrhage in the anterior right frontal lobe. She was transferred to [facility] due to the need for anticoagulation and findings of acute hemorrhage in the anterior right frontal lobe. Her neurological examination is stable and appears to be at her baseline. Heparin gtt per pharmacy. Would recommend limiting bolus. Follow up [facility] for stability purposes. Frequent neuro checks as patient is high risk for neurological deterioration.    Marvis Moeller, DNP, NP-C 04/28/2021, 11:32 PM    Addendum:  Patient seen and examined.  Agree with above.  87 year old female with  Small superficial subarachnoid hemorrhage, recent history of frontal contusions Pulmonary embolism   -Recommend heparin drip, low-dose, without bolus -Once therapeutic on heparin, recommend repeat CT brain for stability.  If stable then can start oral anticoagulation. -No acute neurosurgical intervention recommended.   Thank you for allowing me to participate in this patient's care.  Please do not hesitate to call with questions or concerns.   Elwin Sleight, [facility] Cell: [phone]

## 2021-04-28 NOTE — ED Triage Notes (Signed)
Brought in from [place] due to low oxygen sats.  Dx with pna last week.  88% per SNF with EMS 91% RA and placed on 2L

## 2021-04-29 DIAGNOSIS — I629 Nontraumatic intracranial hemorrhage, unspecified: Secondary | ICD-10-CM | POA: Diagnosis not present

## 2021-04-29 LAB — CBC
HCT: 32.4 % — ABNORMAL LOW (ref 36.0–46.0)
HCT: 35.6 % — ABNORMAL LOW (ref 36.0–46.0)
Hemoglobin: 10.7 g/dL — ABNORMAL LOW (ref 12.0–15.0)
Hemoglobin: 11.6 g/dL — ABNORMAL LOW (ref 12.0–15.0)
MCH: 31.4 pg (ref 26.0–34.0)
MCH: 31.1 pg (ref 26.0–34.0)
MCHC: 33 g/dL (ref 30.0–36.0)
MCHC: 32.6 g/dL (ref 30.0–36.0)
MCV: 95 fL (ref 80.0–100.0)
MCV: 95.4 fL (ref 80.0–100.0)
Platelets: 208 10*3/uL (ref 150–400)
Platelets: 215 10*3/uL (ref 150–400)
RBC: 3.41 MIL/uL — ABNORMAL LOW (ref 3.87–5.11)
RBC: 3.73 MIL/uL — ABNORMAL LOW (ref 3.87–5.11)
RDW: 14.5 % (ref 11.5–15.5)
RDW: 14.2 % (ref 11.5–15.5)
WBC: 6.6 10*3/uL (ref 4.0–10.5)
WBC: 5.4 10*3/uL (ref 4.0–10.5)
nRBC: 0 % (ref 0.0–0.2)
nRBC: 0 % (ref 0.0–0.2)

## 2021-04-29 LAB — HEPARIN LEVEL (UNFRACTIONATED)
Heparin Unfractionated: 0.24 IU/mL — ABNORMAL LOW (ref 0.30–0.70)
Heparin Unfractionated: 0.38 IU/mL (ref 0.30–0.70)

## 2021-04-29 LAB — BASIC METABOLIC PANEL
Anion gap: 6 (ref 5–15)
BUN: 12 mg/dL (ref 8–23)
CO2: 25 mmol/L (ref 22–32)
Calcium: 10.6 mg/dL — ABNORMAL HIGH (ref 8.9–10.3)
Chloride: 109 mmol/L (ref 98–111)
Creatinine, Ser: 0.89 mg/dL (ref 0.44–1.00)
GFR, Estimated: >60 mL/min (ref >60)
Glucose, Bld: 81 mg/dL (ref 70–99)
Potassium: 3.7 mmol/L (ref 3.5–5.1)
Sodium: 140 mmol/L (ref 135–145)

## 2021-04-29 MED ORDER — HEPARIN (PORCINE) 25000 UT/250ML-% IV SOLN
700.0000 [IU]/h | INTRAVENOUS | Status: DC
  Administered 2021-04-29: 01:00:00 700 [IU]/h via INTRAVENOUS
  Administered 2021-04-30: 750 [IU]/h via INTRAVENOUS
  Filled 2021-04-29 (×2): qty 250

## 2021-04-29 NOTE — Progress Notes (Signed)
ANTICOAGULATION CONSULT NOTE - Follow Up Consult  Pharmacy Consult for heparin Indication: pulmonary embolus in setting of acute bifrontal hemorrhagic contusions and subarachnoid hemorrhage.  Heparin DW: 50.8 kg  Labs: Recent Labs    04/28/21 1519 04/29/21 0924 04/29/21 1122 04/29/21 1643  HGB 11.3*  --  11.6*  --   HCT 35.2*  --  35.6*  --   PLT 218  --  215  --   HEPARINUNFRC  --  0.24*  --  0.38  CREATININE 1.01*  --   --   --      Estimated Creatinine Clearance: 27.7 mL/min (A) (by C-G formula based on SCr of 1.01 mg/dL (H)).   Medications:  Medications Prior to Admission  Medication Sig Dispense Refill Last Dose   atorvastatin (LIPITOR) 40 MG tablet Take 40 mg by mouth daily.   04/27/2021   feeding supplement (ENSURE ENLIVE / ENSURE PLUS) LIQD Take 237 mLs by mouth 2 (two) times daily between meals. 237 mL 12 04/28/2021   guaiFENesin (MUCINEX) 600 MG 12 hr tablet Take by mouth 2 (two) times daily.      iron polysaccharides (NIFEREX) 150 MG capsule Take 150 mg by mouth daily.   04/28/2021   omeprazole (PRILOSEC) 20 MG capsule TAKE 1 CAPSULE (20 MG TOTAL) BY MOUTH 2 (TWO) TIMES DAILY BEFORE A MEAL. 180 capsule 3 04/28/2021   solifenacin (VESICARE) 5 MG tablet Take 1 tablet (5 mg total) by mouth daily. 90 tablet 3 04/28/2021   verapamil (CALAN-SR) 180 MG CR tablet Take 1 tablet (180 mg total) by mouth at bedtime. 90 tablet 0 04/27/2021   VITAMIN D PO 1,000 Int'l Units/day.   04/28/2021   aspirin EC 81 MG tablet Take 81 mg by mouth daily. Swallow whole.      atorvastatin (LIPITOR) 20 MG tablet TAKE 1 TABLET AT BEDTIME. (Patient not taking: Reported on 04/28/2021) 90 tablet 0 Not Taking   levofloxacin (LEVAQUIN) 500 MG tablet Take 500 mg by mouth daily. (Patient not taking: Reported on 04/28/2021)   Completed Course   trimethoprim (TRIMPEX) 100 MG tablet Take 1 tablet (100 mg total) by mouth daily. (Patient not taking: Reported on 04/28/2021) 90 tablet 3 Not Taking    Scheduled:   atorvastatin  40 mg Oral Daily   darifenacin  7.5 mg Oral Daily   pantoprazole  40 mg Oral Daily   verapamil  180 mg Oral QHS    Assessment: 87yo female found to have scattered small PE complicated by recent traumatic ICH and now w/ CT concerning for acute anterior right frontal lobe hemorrhage; neurosurgery has reviewed pt and agrees with low-goal heparin infusion for PE.  Heparin level of 0.38 is therapeutic on heparin 750 units/hr. Level drawn appropriately. Per RN no issues with IV infusion or access. No bleeding noted.   Goal of Therapy:  Heparin level 0.3-0.5 units/ml Monitor platelets by anticoagulation protocol: Yes   Plan:  Continue heparin 750 units/hr Check 8 hr confirmatory heparin level   Cristela Felt, PharmD, BCPS Clinical Pharmacist 04/29/2021 5:27 PM

## 2021-04-29 NOTE — Progress Notes (Signed)
PROGRESS NOTE    Amanda [name]  QZE:[id number] DOB: 05-14-1931 DOA: 04/28/2021 PCP: [name], DO  Brief Narrative: 89/F with history of dementia, hypertension tension, dyslipidemia who was recently discharged to SNF from [facility] following hospitalization for traumatic intracranial hemorrhage following a fall she presented to the ED with complaints of shortness of breath, was noted to be mildly hypoxic to 88% on room air on arrival -She had a CTA chest in the ED which noted small scattered bilateral pulmonary emboli and atelectasis, CT head noted acute hemorrhage in the anterior right frontal lobe, she was subsequently transferred to [facility] for neurosurgical evaluation. -Seen by neurosurgery yesterday and started on low-dose heparin drip without bolus  Assessment & Plan:   Intracranial hemorrhage -Recent admission secondary to traumatic small SAH following a fall, unclear if she had another fall again or if it was spontaneous this time, ?  Amyloid angiopathy -Neurosurgery consulting, low started on low-dose heparin infusion for PE last evening, plan for repeat CT head tomorrow, if stable transition to oral anticoagulation  -Neurochecks -Called and updated daughter regarding the risk of rebleeding, clinical deterioration, she confirms DNR    Pulmonary embolism -new diagnosis, CTA chest in the ED yesterday noted few scattered tiny pulmonary emboli extending filling defects. -Started on low-dose heparin drip without bolus yesterday per neurosurgical recommendations, repeat CT head tomorrow if stable could transition to oral anticoagulation soon   Acute hypoxic respiratory failure -Due to above, as well as evidence of atelectasis on imaging,  -encourage incentive spirometry -Out of bed as tolerated  Dementia -Stable   Hyperlipidemia -Continue with statin   GERD - Continue with PPI   Hypokalemia -Mild, repleted -Labs tomorrow   Hypertension -continue with verapamil  DVT  prophylaxis: SCDs Code Status: DNR Family Communication: No family at bedside, called and updated patient's daughter Disposition Plan: Back to SNF if stable in 3 to 4 days Status is: Inpatient  Remains inpatient appropriate because: Severity of illness  Consultants:  Neurosurgery  Procedures:   Antimicrobials:    Subjective: -Feels well, denies any complaints  Objective: Vitals:   04/28/21 1930 04/28/21 2259 04/29/21 0311 04/29/21 0745  BP: (!) 143/69 (!) 161/82 134/62 129/71  Pulse: 65 76 63 61  Resp: 17 18 17 17   Temp:  97.6 F (36.4 C) 97.9 F (36.6 C) 98 F (36.7 C)  TempSrc:  Oral Oral Oral  SpO2: 97% 92% 93% 94%  Weight:      Height:       No intake or output data in the 24 hours ending 04/29/21 1042 Filed Weights   04/28/21 1507  Weight: 50.8 kg    Examination:  General exam: Pleasant elderly female laying in bed, AAOx2, no distress, cognitive deficits noted CVS: S1-S2, regular rate rhythm Lungs: Clear bilaterally Abdomen: Soft, nontender, bowel sounds present Extremities: No edema Neuro: Moves all extremities, no localizing signs, pupils equal and reactive Skin: No rashes Psychiatry: Judgement and insight appear normal. Mood & affect appropriate.     Data Reviewed:   CBC: Recent Labs  Lab 04/28/21 1519  WBC 6.1  NEUTROABS 4.0  HGB 11.3*  HCT 35.2*  MCV 98.1  PLT 226   Basic Metabolic Panel: Recent Labs  Lab 04/28/21 1519  NA 141  K 3.4*  CL 108  CO2 26  GLUCOSE 124*  BUN 16  CREATININE 1.01*  CALCIUM 10.5*   GFR: Estimated Creatinine Clearance: 27.7 mL/min (A) (by C-G formula based on SCr of 1.01 mg/dL (H)). Liver  Function Tests: No results for input(s): AST, ALT, ALKPHOS, BILITOT, PROT, ALBUMIN in the last 168 hours. No results for input(s): LIPASE, AMYLASE in the last 168 hours. No results for input(s): AMMONIA in the last 168 hours. Coagulation Profile: No results for input(s): INR, PROTIME in the last 168  hours. Cardiac Enzymes: No results for input(s): CKTOTAL, CKMB, CKMBINDEX, TROPONINI in the last 168 hours. BNP (last 3 results) No results for input(s): PROBNP in the last 8760 hours. HbA1C: No results for input(s): HGBA1C in the last 72 hours. CBG: No results for input(s): GLUCAP in the last 168 hours. Lipid Profile: No results for input(s): CHOL, HDL, LDLCALC, TRIG, CHOLHDL, LDLDIRECT in the last 72 hours. Thyroid Function Tests: No results for input(s): TSH, T4TOTAL, FREET4, T3FREE, THYROIDAB in the last 72 hours. Anemia Panel: No results for input(s): VITAMINB12, FOLATE, FERRITIN, TIBC, IRON, RETICCTPCT in the last 72 hours. Urine analysis:    Component Value Date/Time   COLORURINE YELLOW 03/26/2021 0535   APPEARANCEUR Clear 04/16/2021 1053   LABSPEC 1.012 03/26/2021 0535   PHURINE 7.0 03/26/2021 0535   GLUCOSEU Negative 04/16/2021 1053   HGBUR SMALL (A) 03/26/2021 0535   BILIRUBINUR Negative 04/16/2021 [place] 03/26/2021 0535   PROTEINUR Negative 04/16/2021 1053   PROTEINUR NEGATIVE 03/26/2021 0535   UROBILINOGEN 0.2 12/18/2019 1009   UROBILINOGEN 1.0 04/10/2013 2115   NITRITE Negative 04/16/2021 1053   NITRITE NEGATIVE 03/26/2021 0535   LEUKOCYTESUR 1+ (A) 04/16/2021 1053   LEUKOCYTESUR LARGE (A) 03/26/2021 0535   Sepsis Labs: @LABRCNTIP (procalcitonin:4,lacticidven:4)  ) Recent Results (from the past 240 hour(s))  Resp Panel by RT-PCR (Flu A&B, Covid) Nasopharyngeal Swab     Status: None   Collection Time: 04/28/21  3:17 PM   Specimen: Nasopharyngeal Swab; Nasopharyngeal(NP) swabs in vial transport medium  Result Value Ref Range Status   SARS Coronavirus 2 by RT PCR NEGATIVE NEGATIVE Final    Comment: (NOTE) SARS-CoV-2 target nucleic acids are NOT DETECTED.  The SARS-CoV-2 RNA is generally detectable in upper respiratory specimens during the acute phase of infection. The lowest concentration of SARS-CoV-2 viral copies this assay can detect  is 138 copies/mL. A negative result does not preclude SARS-Cov-2 infection and should not be used as the sole basis for treatment or other patient management decisions. A negative result may occur with  improper specimen collection/handling, submission of specimen other than nasopharyngeal swab, presence of viral mutation(s) within the areas targeted by this assay, and inadequate number of viral copies(<138 copies/mL). A negative result must be combined with clinical observations, patient history, and epidemiological information. The expected result is Negative.  Fact Sheet for Patients:  EntrepreneurPulse.com.au  Fact Sheet for Healthcare Providers:  IncredibleEmployment.be  This test is no t yet approved or cleared by the [place] FDA and  has been authorized for detection and/or diagnosis of SARS-CoV-2 by FDA under an Emergency Use Authorization (EUA). This EUA will remain  in effect (meaning this test can be used) for the duration of the COVID-19 declaration under Section 564(b)(1) of the Act, 21 U.S.C.section 360bbb-3(b)(1), unless the authorization is terminated  or revoked sooner.       Influenza A by PCR NEGATIVE NEGATIVE Final   Influenza B by PCR NEGATIVE NEGATIVE Final    Comment: (NOTE) The Xpert Xpress SARS-CoV-2/FLU/RSV plus assay is intended as an aid in the diagnosis of influenza from Nasopharyngeal swab specimens and should not be used as a sole basis for treatment. Nasal washings and aspirates are unacceptable  for Xpert Xpress SARS-CoV-2/FLU/RSV testing.  Fact Sheet for Patients: EntrepreneurPulse.com.au  Fact Sheet for Healthcare Providers: IncredibleEmployment.be  This test is not yet approved or cleared by the [place] FDA and has been authorized for detection and/or diagnosis of SARS-CoV-2 by FDA under an Emergency Use Authorization (EUA). This EUA will remain in effect  (meaning this test can be used) for the duration of the COVID-19 declaration under Section 564(b)(1) of the Act, 21 U.S.C. section 360bbb-3(b)(1), unless the authorization is terminated or revoked.  Performed at [facility], [address] [place], [place] [address]   Culture, blood (routine x 2)     Status: None (Preliminary result)   Collection Time: 04/28/21  3:56 PM   Specimen: BLOOD RIGHT ARM  Result Value Ref Range Status   Specimen Description BLOOD RIGHT ARM  Final   Special Requests   Final    BOTTLES DRAWN AEROBIC AND ANAEROBIC Blood Culture adequate volume   Culture   Final    NO GROWTH < 12 HOURS Performed at [facility], [address], [place], [place] [address]    Report Status PENDING  Incomplete  Culture, blood (routine x 2)     Status: None (Preliminary result)   Collection Time: 04/28/21  3:56 PM   Specimen: BLOOD LEFT HAND  Result Value Ref Range Status   Specimen Description BLOOD LEFT HAND  Final   Special Requests   Final    BOTTLES DRAWN AEROBIC AND ANAEROBIC Blood Culture adequate volume   Culture   Final    NO GROWTH < 12 HOURS Performed at [facility], [address] [place],  [address]    Report Status PENDING  Incomplete         Radiology Studies: CT HEAD WO CONTRAST (5MM)  Result Date: 04/28/2021 CLINICAL DATA:  Cerebral hemorrhage suspected EXAM: CT HEAD WITHOUT CONTRAST TECHNIQUE: Contiguous axial images were obtained from the base of the skull through the vertex without intravenous contrast. COMPARISON:  03/12/2021. FINDINGS: Brain: Small area of hyperdense material in the anterior right frontal lobe (series 2, image 14). Additional previously noted hyperdense areas are no longer seen. No acute infarct. No mass, mass effect, or midline shift. Slight increase in size of lateral and third ventricles compared to 03/12/2021. Periventricular white matter changes, likely the sequela of chronic small vessel ischemic disease. Vascular:  Contrast is noted within the vascular system after prior contrasted study. Skull: Normal. Negative for fracture or focal lesion. Sinuses/Orbits: Mild mucosal thickening in the ethmoid air cells. Status post bilateral lens replacements. Other: The mastoids are well aerated. IMPRESSION: 1. Hyperdensity in the anterior right frontal lobe. Although the patient received intravenous contrast today, this is felt most likely to be acute hemorrhage. This is in an area that previously demonstrated parenchymal hemorrhage/contusion, although that hemorrhage has resolved, as has hemorrhage in the left anterior frontal lobe. No mass effect or midline shift. 2. Slight increase in size of the ventricular system, without definite hydrocephalus. Attention on follow-up. These results were called by telephone at the time of interpretation on 04/28/2021 at 7:11 pm to provider Dr. Gilford Raid, Who verbally acknowledged these results. Electronically Signed   By: Merilyn Baba M.D.   On: 04/28/2021 19:13   CT Angio Chest PE W and/or Wo Contrast  Result Date: 04/28/2021 CLINICAL DATA:  Low oxygen saturation question pulmonary embolism, recent pneumonia EXAM: CT ANGIOGRAPHY CHEST WITH CONTRAST TECHNIQUE: Multidetector CT imaging of the chest was performed using the standard protocol during bolus administration of  intravenous contrast. Multiplanar CT image reconstructions and MIPs were obtained to evaluate the vascular anatomy. CONTRAST:  46mL OMNIPAQUE IOHEXOL 350 MG/ML SOLN COMPARISON:  None FINDINGS: Cardiovascular: Atherosclerotic calcifications aorta, coronary arteries, and proximal great vessels. Aberrant origin of the RIGHT subclavian artery. No evidence of aortic aneurysm or dissection. Heart appears mildly enlarged. Minimal pericardial fluid. Pulmonary arteries adequately opacified. Several tiny filling defects are seen within RIGHT upper and RIGHT middle lobe pulmonary arteries. In addition, eccentric filling defects are seen  within the LEFT lower lobe pulmonary artery and the RIGHT upper lobe pulmonary artery. Findings are consistent with pulmonary embolism, though this may represent a combination of acute and old emboli. Mediastinum/Nodes: Base of cervical region normal appearance. Esophagus unremarkable. No thoracic adenopathy. Lungs/Pleura: Dependent atelectasis bilaterally. No pulmonary infiltrate, pleural effusion, or pneumothorax. Upper Abdomen: Visualized upper abdomen unremarkable Musculoskeletal: Osseous structures normal appearance. Review of the MIP images confirms the above findings. IMPRESSION: Few scattered tiny pulmonary emboli are identified with additional eccentric filling defects which may represent sequela of new or old pulmonary emboli. Scattered atherosclerotic calcifications including coronary arteries. Aberrant origin of the RIGHT subclavian artery. Dependent atelectasis BILATERAL lower lobes. Aortic Atherosclerosis (ICD10-I70.0). Critical Value/emergent results were called by telephone at the time of interpretation on 04/28/2021 at 5:26 pm to provider Isla Pence MD, who verbally acknowledged these results. Electronically Signed   By: Lavonia Dana M.D.   On: 04/28/2021 17:26   DG Chest Port 1 View  Result Date: 04/28/2021 CLINICAL DATA:  Shortness of breath EXAM: PORTABLE CHEST 1 VIEW COMPARISON:  04/09/2013, 02/22/2016 report FINDINGS: No focal opacity or pleural effusion. Cardiomediastinal silhouette within normal limits. Aortic atherosclerosis. No pneumothorax. IMPRESSION: No active disease. Electronically Signed   By: Donavan Foil M.D.   On: 04/28/2021 15:32        Scheduled Meds:  atorvastatin  40 mg Oral Daily   darifenacin  7.5 mg Oral Daily   pantoprazole  40 mg Oral Daily   verapamil  180 mg Oral QHS   Continuous Infusions:  heparin 700 Units/hr (04/29/21 0116)     LOS: 1 day    Time spent: 69min    Domenic Polite, MD [facility]   04/29/2021, 10:42 AM

## 2021-04-29 NOTE — Progress Notes (Signed)
ANTICOAGULATION CONSULT NOTE - Follow Up Consult  Pharmacy Consult for heparin Indication: pulmonary embolus in setting of acute bifrontal hemorrhagic contusions and subarachnoid hemorrhage.  Heparin DW: 50.8 kg  Labs: Recent Labs    04/28/21 1519 04/29/21 0924  HGB 11.3*  --   HCT 35.2*  --   PLT 218  --   HEPARINUNFRC  --  0.24*  CREATININE 1.01*  --      Estimated Creatinine Clearance: 27.7 mL/min (A) (by C-G formula based on SCr of 1.01 mg/dL (H)).   Medications:  Medications Prior to Admission  Medication Sig Dispense Refill Last Dose   atorvastatin (LIPITOR) 40 MG tablet Take 40 mg by mouth daily.   04/27/2021   feeding supplement (ENSURE ENLIVE / ENSURE PLUS) LIQD Take 237 mLs by mouth 2 (two) times daily between meals. 237 mL 12 04/28/2021   guaiFENesin (MUCINEX) 600 MG 12 hr tablet Take by mouth 2 (two) times daily.      iron polysaccharides (NIFEREX) 150 MG capsule Take 150 mg by mouth daily.   04/28/2021   omeprazole (PRILOSEC) 20 MG capsule TAKE 1 CAPSULE (20 MG TOTAL) BY MOUTH 2 (TWO) TIMES DAILY BEFORE A MEAL. 180 capsule 3 04/28/2021   solifenacin (VESICARE) 5 MG tablet Take 1 tablet (5 mg total) by mouth daily. 90 tablet 3 04/28/2021   verapamil (CALAN-SR) 180 MG CR tablet Take 1 tablet (180 mg total) by mouth at bedtime. 90 tablet 0 04/27/2021   VITAMIN D PO 1,000 Int'l Units/day.   04/28/2021   aspirin EC 81 MG tablet Take 81 mg by mouth daily. Swallow whole.      atorvastatin (LIPITOR) 20 MG tablet TAKE 1 TABLET AT BEDTIME. (Patient not taking: Reported on 04/28/2021) 90 tablet 0 Not Taking   levofloxacin (LEVAQUIN) 500 MG tablet Take 500 mg by mouth daily. (Patient not taking: Reported on 04/28/2021)   Completed Course   trimethoprim (TRIMPEX) 100 MG tablet Take 1 tablet (100 mg total) by mouth daily. (Patient not taking: Reported on 04/28/2021) 90 tablet 3 Not Taking   Scheduled:   atorvastatin  40 mg Oral Daily   darifenacin  7.5 mg Oral Daily    pantoprazole  40 mg Oral Daily   verapamil  180 mg Oral QHS    Assessment: 87yo female found to have scattered small PE complicated by recent traumatic ICH and now w/ CT concerning for acute anterior right frontal lobe hemorrhage; neurosurgery has reviewed pt and agrees with low-goal heparin infusion for PE.  Patient is on heparin gtt at 700 units/hr. Heparin level is subtherapeutic at 0.24. Per RN, no signs or symptoms of bleeding. Hemoglobin and plts stable at 11.6 and 215 today.  Goal of Therapy:  Heparin level 0.3-0.5 units/ml Monitor platelets by anticoagulation protocol: Yes   Plan:  Increase heparin infusion to 750 units/hr and monitor heparin levels and CBC. No bolus  and tighter goal given recent ICH HL in 8 hours (11/24 1700)  Cathrine Muster, PharmD PGY2 Cardiology Pharmacy Resident Phone: [phone] 04/29/2021  10:57 AM  Please check [email or web address] for unit-specific pharmacy phone numbers.

## 2021-04-29 NOTE — Progress Notes (Signed)
ANTICOAGULATION CONSULT NOTE - Follow Up Consult  Pharmacy Consult for heparin Indication: pulmonary embolus in setting of acute bifrontal hemorrhagic contusions and subarachnoid hemorrhage  Labs: Recent Labs    04/28/21 1519  HGB 11.3*  HCT 35.2*  PLT 218  CREATININE 1.01*    Estimated Creatinine Clearance: 27.7 mL/min (A) (by C-G formula based on SCr of 1.01 mg/dL (H)).   Medications:  Medications Prior to Admission  Medication Sig Dispense Refill Last Dose   atorvastatin (LIPITOR) 40 MG tablet Take 40 mg by mouth daily.   04/27/2021   feeding supplement (ENSURE ENLIVE / ENSURE PLUS) LIQD Take 237 mLs by mouth 2 (two) times daily between meals. 237 mL 12 04/28/2021   guaiFENesin (MUCINEX) 600 MG 12 hr tablet Take by mouth 2 (two) times daily.      iron polysaccharides (NIFEREX) 150 MG capsule Take 150 mg by mouth daily.   04/28/2021   omeprazole (PRILOSEC) 20 MG capsule TAKE 1 CAPSULE (20 MG TOTAL) BY MOUTH 2 (TWO) TIMES DAILY BEFORE A MEAL. 180 capsule 3 04/28/2021   solifenacin (VESICARE) 5 MG tablet Take 1 tablet (5 mg total) by mouth daily. 90 tablet 3 04/28/2021   verapamil (CALAN-SR) 180 MG CR tablet Take 1 tablet (180 mg total) by mouth at bedtime. 90 tablet 0 04/27/2021   VITAMIN D PO 1,000 Int'l Units/day.   04/28/2021   aspirin EC 81 MG tablet Take 81 mg by mouth daily. Swallow whole.      atorvastatin (LIPITOR) 20 MG tablet TAKE 1 TABLET AT BEDTIME. (Patient not taking: Reported on 04/28/2021) 90 tablet 0 Not Taking   levofloxacin (LEVAQUIN) 500 MG tablet Take 500 mg by mouth daily. (Patient not taking: Reported on 04/28/2021)   Completed Course   trimethoprim (TRIMPEX) 100 MG tablet Take 1 tablet (100 mg total) by mouth daily. (Patient not taking: Reported on 04/28/2021) 90 tablet 3 Not Taking   Scheduled:   atorvastatin  40 mg Oral Daily   darifenacin  7.5 mg Oral Daily   pantoprazole  40 mg Oral Daily   verapamil  180 mg Oral QHS    Assessment: 87yo female  found to have scattered small PE complicated by recent traumatic ICH and now w/ CT concerning for acute anterior right frontal lobe hemorrhage; neurosurgery has reviewed pt and agrees with low-goal heparin infusion for PE.  Goal of Therapy:  Heparin level 0.3-0.5 units/ml Monitor platelets by anticoagulation protocol: Yes   Plan:  Heparin infusion at 700 units/hr and monitor heparin levels and CBC.  Wynona Neat, PharmD, BCPS  04/29/2021,12:34 AM

## 2021-04-30 ENCOUNTER — Inpatient Hospital Stay (HOSPITAL_COMMUNITY): Payer: Medicare HMO

## 2021-04-30 ENCOUNTER — Other Ambulatory Visit (HOSPITAL_COMMUNITY): Payer: Self-pay

## 2021-04-30 DIAGNOSIS — I629 Nontraumatic intracranial hemorrhage, unspecified: Secondary | ICD-10-CM | POA: Diagnosis not present

## 2021-04-30 LAB — BASIC METABOLIC PANEL
Anion gap: 7 (ref 5–15)
BUN: 13 mg/dL (ref 8–23)
CO2: 26 mmol/L (ref 22–32)
Calcium: 11.5 mg/dL — ABNORMAL HIGH (ref 8.9–10.3)
Chloride: 105 mmol/L (ref 98–111)
Creatinine, Ser: 1.05 mg/dL — ABNORMAL HIGH (ref 0.44–1.00)
GFR, Estimated: 51 mL/min — ABNORMAL LOW (ref >60)
Glucose, Bld: 87 mg/dL (ref 70–99)
Potassium: 4 mmol/L (ref 3.5–5.1)
Sodium: 138 mmol/L (ref 135–145)

## 2021-04-30 LAB — URINALYSIS, ROUTINE W REFLEX MICROSCOPIC
Bilirubin Urine: NEGATIVE
Glucose, UA: NEGATIVE mg/dL
Ketones, ur: NEGATIVE mg/dL
Nitrite: NEGATIVE
Protein, ur: 30 mg/dL — AB
RBC / HPF: >50 RBC/hpf — ABNORMAL HIGH (ref 0–5)
Specific Gravity, Urine: 1.018 (ref 1.005–1.030)
pH: 9 — ABNORMAL HIGH (ref 5.0–8.0)

## 2021-04-30 LAB — CBC
HCT: 36.2 % (ref 36.0–46.0)
Hemoglobin: 11.9 g/dL — ABNORMAL LOW (ref 12.0–15.0)
MCH: 31.2 pg (ref 26.0–34.0)
MCHC: 32.9 g/dL (ref 30.0–36.0)
MCV: 94.8 fL (ref 80.0–100.0)
Platelets: 247 10*3/uL (ref 150–400)
RBC: 3.82 MIL/uL — ABNORMAL LOW (ref 3.87–5.11)
RDW: 14 % (ref 11.5–15.5)
WBC: 6.5 10*3/uL (ref 4.0–10.5)
nRBC: 0 % (ref 0.0–0.2)

## 2021-04-30 LAB — HEPARIN LEVEL (UNFRACTIONATED): Heparin Unfractionated: 0.37 IU/mL (ref 0.30–0.70)

## 2021-04-30 MED ORDER — GUAIFENESIN-DM 100-10 MG/5ML PO SYRP
5.0000 mL | ORAL_SOLUTION | ORAL | Status: DC | PRN
  Administered 2021-04-30: 5 mL via ORAL
  Filled 2021-04-30: qty 5

## 2021-04-30 NOTE — Progress Notes (Signed)
No significant change in status.  Patient with follow-up head CT scan today which demonstrates a small left frontal contusion.  No evidence of mass effect or other worrisome bleeding.  Okay to continue low-dose heparin for pulmonary embolus.

## 2021-04-30 NOTE — Progress Notes (Signed)
PROGRESS NOTE    Amanda [name]  BTD:[id number] DOB: [birth year] DOA: 04/28/2021 PCP: [name], DO  Brief Narrative: 89/F with history of dementia, hypertension tension, dyslipidemia who was recently discharged to SNF from [facility] following hospitalization for traumatic intracranial hemorrhage following a fall she presented to the ED with complaints of shortness of breath, was noted to be mildly hypoxic to 88% on room air on arrival -She had a CTA chest in the ED which noted small scattered bilateral pulmonary emboli and atelectasis, CT head noted acute hemorrhage in the anterior right frontal lobe, she was subsequently transferred to [facility] for neurosurgical evaluation. -Seen by neurosurgery yesterday and started on low-dose heparin drip without bolus  Assessment & Plan:   Intracranial hemorrhage -Recent admission secondary to traumatic small SAH following a fall, unclear if she had another fall again or if it was spontaneous this time, ?  Amyloid angiopathy -Neurosurgery consulting, low started on low-dose heparin infusion for PE last evening, plan for repeat CT head today if stable transition to oral anticoagulation tomorrow -Remains stable clinically -updated daughter regarding the risk of rebleeding, clinical deterioration, she confirms DNR    Pulmonary embolism -new diagnosis, CTA chest in the ED yesterday noted few scattered tiny pulmonary emboli extending filling defects. -Started on low-dose heparin drip without bolus yesterday per neurosurgical recommendations, repeat CT head today if stable could transition to oral anticoagulation soon   Acute hypoxic respiratory failure -Due to above, as well as evidence of atelectasis on imaging,  -encourage incentive spirometry -Out of bed as tolerated  Dementia -Stable   Hyperlipidemia -Continue with statin   GERD - Continue with PPI   Hypokalemia -Mild, repleted -Labs tomorrow   Hypertension -continue with verapamil  DVT  prophylaxis: SCDs Code Status: DNR Family Communication: Updated daughter today Disposition Plan: Back to SNF if stable in 3 to 4 days Status is: Inpatient  Remains inpatient appropriate because: Severity of illness  Consultants:  Neurosurgery  Procedures:   Antimicrobials:    Subjective: -Feels well, denies any complaints  Objective: Vitals:   04/29/21 1946 04/29/21 2314 04/30/21 0320 04/30/21 0731  BP: 140/70 (!) 155/135 (!) 161/67 (!) 141/71  Pulse: 68 61 66 73  Resp: 16 16 16 14   Temp: 98 F (36.7 C) 98.1 F (36.7 C) 98.4 F (36.9 C) 97.6 F (36.4 C)  TempSrc: Oral Oral Oral Oral  SpO2: 93% 93% 96% 94%  Weight:      Height:        Intake/Output Summary (Last 24 hours) at 04/30/2021 1111 Last data filed at 04/30/2021 0732 Gross per 24 hour  Intake 713.09 ml  Output 850 ml  Net -136.91 ml   Filed Weights   04/28/21 1507  Weight: 50.8 kg    Examination:  General exam: Pleasant elderly female, laying in bed, AAO x2, no distress, Cognitive deficits noted  CVS: S1-S2, regular rate rhythm Lungs: Clear bilaterally Abdomen: Soft, nontender, bowel sounds present Extremities: No edema Neuro: Moves all extremities, no localizing signs, pupils are equal and reactive Skin: No rashes Psychiatry: Poor insight and judgment.   Data Reviewed:   CBC: Recent Labs  Lab 04/28/21 1519 04/29/21 0223 04/29/21 1122 04/30/21 0101  WBC 6.1 6.6 5.4 6.5  NEUTROABS 4.0  --   --   --   HGB 11.3* 10.7* 11.6* 11.9*  HCT 35.2* 32.4* 35.6* 36.2  MCV 98.1 95.0 95.4 94.8  PLT 218 208 215 106   Basic Metabolic Panel: Recent Labs  Lab 04/28/21  1519 04/29/21 0223 04/30/21 0101  NA 141 140 138  K 3.4* 3.7 4.0  CL 108 109 105  CO2 26 25 26   GLUCOSE 124* 81 87  BUN 16 12 13   CREATININE 1.01* 0.89 1.05*  CALCIUM 10.5* 10.6* 11.5*   GFR: Estimated Creatinine Clearance: 26.6 mL/min (A) (by C-G formula based on SCr of 1.05 mg/dL (H)). Liver Function Tests: No results  for input(s): AST, ALT, ALKPHOS, BILITOT, PROT, ALBUMIN in the last 168 hours. No results for input(s): LIPASE, AMYLASE in the last 168 hours. No results for input(s): AMMONIA in the last 168 hours. Coagulation Profile: No results for input(s): INR, PROTIME in the last 168 hours. Cardiac Enzymes: No results for input(s): CKTOTAL, CKMB, CKMBINDEX, TROPONINI in the last 168 hours. BNP (last 3 results) No results for input(s): PROBNP in the last 8760 hours. HbA1C: No results for input(s): HGBA1C in the last 72 hours. CBG: No results for input(s): GLUCAP in the last 168 hours. Lipid Profile: No results for input(s): CHOL, HDL, LDLCALC, TRIG, CHOLHDL, LDLDIRECT in the last 72 hours. Thyroid Function Tests: No results for input(s): TSH, T4TOTAL, FREET4, T3FREE, THYROIDAB in the last 72 hours. Anemia Panel: No results for input(s): VITAMINB12, FOLATE, FERRITIN, TIBC, IRON, RETICCTPCT in the last 72 hours. Urine analysis:    Component Value Date/Time   COLORURINE YELLOW 03/26/2021 0535   APPEARANCEUR Clear 04/16/2021 1053   LABSPEC 1.012 03/26/2021 0535   PHURINE 7.0 03/26/2021 0535   GLUCOSEU Negative 04/16/2021 1053   HGBUR SMALL (A) 03/26/2021 0535   BILIRUBINUR Negative 04/16/2021 [place] 03/26/2021 0535   PROTEINUR Negative 04/16/2021 1053   PROTEINUR NEGATIVE 03/26/2021 0535   UROBILINOGEN 0.2 12/18/2019 1009   UROBILINOGEN 1.0 04/10/2013 2115   NITRITE Negative 04/16/2021 1053   NITRITE NEGATIVE 03/26/2021 0535   LEUKOCYTESUR 1+ (A) 04/16/2021 1053   LEUKOCYTESUR LARGE (A) 03/26/2021 0535   Sepsis Labs: @LABRCNTIP (procalcitonin:4,lacticidven:4)  ) Recent Results (from the past 240 hour(s))  Resp Panel by RT-PCR (Flu A&B, Covid) Nasopharyngeal Swab     Status: None   Collection Time: 04/28/21  3:17 PM   Specimen: Nasopharyngeal Swab; Nasopharyngeal(NP) swabs in vial transport medium  Result Value Ref Range Status   SARS Coronavirus 2 by RT PCR NEGATIVE  NEGATIVE Final    Comment: (NOTE) SARS-CoV-2 target nucleic acids are NOT DETECTED.  The SARS-CoV-2 RNA is generally detectable in upper respiratory specimens during the acute phase of infection. The lowest concentration of SARS-CoV-2 viral copies this assay can detect is 138 copies/mL. A negative result does not preclude SARS-Cov-2 infection and should not be used as the sole basis for treatment or other patient management decisions. A negative result may occur with  improper specimen collection/handling, submission of specimen other than nasopharyngeal swab, presence of viral mutation(s) within the areas targeted by this assay, and inadequate number of viral copies(<138 copies/mL). A negative result must be combined with clinical observations, patient history, and epidemiological information. The expected result is Negative.  Fact Sheet for Patients:  EntrepreneurPulse.com.au  Fact Sheet for Healthcare Providers:  IncredibleEmployment.be  This test is no t yet approved or cleared by the [place] FDA and  has been authorized for detection and/or diagnosis of SARS-CoV-2 by FDA under an Emergency Use Authorization (EUA). This EUA will remain  in effect (meaning this test can be used) for the duration of the COVID-19 declaration under Section 564(b)(1) of the Act, 21 U.S.C.section 360bbb-3(b)(1), unless the authorization is terminated  or revoked  sooner.       Influenza A by PCR NEGATIVE NEGATIVE Final   Influenza B by PCR NEGATIVE NEGATIVE Final    Comment: (NOTE) The Xpert Xpress SARS-CoV-2/FLU/RSV plus assay is intended as an aid in the diagnosis of influenza from Nasopharyngeal swab specimens and should not be used as a sole basis for treatment. Nasal washings and aspirates are unacceptable for Xpert Xpress SARS-CoV-2/FLU/RSV testing.  Fact Sheet for Patients: EntrepreneurPulse.com.au  Fact Sheet for Healthcare  Providers: IncredibleEmployment.be  This test is not yet approved or cleared by the [place] FDA and has been authorized for detection and/or diagnosis of SARS-CoV-2 by FDA under an Emergency Use Authorization (EUA). This EUA will remain in effect (meaning this test can be used) for the duration of the COVID-19 declaration under Section 564(b)(1) of the Act, 21 U.S.C. section 360bbb-3(b)(1), unless the authorization is terminated or revoked.  Performed at [facility], [address], [place], [place] [address]   Culture, blood (routine x 2)     Status: None (Preliminary result)   Collection Time: 04/28/21  3:56 PM   Specimen: BLOOD RIGHT ARM  Result Value Ref Range Status   Specimen Description BLOOD RIGHT ARM  Final   Special Requests   Final    BOTTLES DRAWN AEROBIC AND ANAEROBIC Blood Culture adequate volume   Culture   Final    NO GROWTH 2 DAYS Performed at [facility], [address] [place], [place] [address]    Report Status PENDING  Incomplete  Culture, blood (routine x 2)     Status: None (Preliminary result)   Collection Time: 04/28/21  3:56 PM   Specimen: BLOOD LEFT HAND  Result Value Ref Range Status   Specimen Description BLOOD LEFT HAND  Final   Special Requests   Final    BOTTLES DRAWN AEROBIC AND ANAEROBIC Blood Culture adequate volume   Culture   Final    NO GROWTH 2 DAYS Performed at [facility], [address] [place],  [address]    Report Status PENDING  Incomplete         Radiology Studies: CT HEAD WO CONTRAST (5MM)  Result Date: 04/28/2021 CLINICAL DATA:  Cerebral hemorrhage suspected EXAM: CT HEAD WITHOUT CONTRAST TECHNIQUE: Contiguous axial images were obtained from the base of the skull through the vertex without intravenous contrast. COMPARISON:  03/12/2021. FINDINGS: Brain: Small area of hyperdense material in the anterior right frontal lobe (series 2, image 14). Additional previously noted hyperdense areas are  no longer seen. No acute infarct. No mass, mass effect, or midline shift. Slight increase in size of lateral and third ventricles compared to 03/12/2021. Periventricular white matter changes, likely the sequela of chronic small vessel ischemic disease. Vascular: Contrast is noted within the vascular system after prior contrasted study. Skull: Normal. Negative for fracture or focal lesion. Sinuses/Orbits: Mild mucosal thickening in the ethmoid air cells. Status post bilateral lens replacements. Other: The mastoids are well aerated. IMPRESSION: 1. Hyperdensity in the anterior right frontal lobe. Although the patient received intravenous contrast today, this is felt most likely to be acute hemorrhage. This is in an area that previously demonstrated parenchymal hemorrhage/contusion, although that hemorrhage has resolved, as has hemorrhage in the left anterior frontal lobe. No mass effect or midline shift. 2. Slight increase in size of the ventricular system, without definite hydrocephalus. Attention on follow-up. These results were called by telephone at the time of interpretation on 04/28/2021 at 7:11 pm to provider Dr. Gilford Raid, Who verbally acknowledged these  results. Electronically Signed   By: Merilyn Baba M.D.   On: 04/28/2021 19:13   CT Angio Chest PE W and/or Wo Contrast  Result Date: 04/28/2021 CLINICAL DATA:  Low oxygen saturation question pulmonary embolism, recent pneumonia EXAM: CT ANGIOGRAPHY CHEST WITH CONTRAST TECHNIQUE: Multidetector CT imaging of the chest was performed using the standard protocol during bolus administration of intravenous contrast. Multiplanar CT image reconstructions and MIPs were obtained to evaluate the vascular anatomy. CONTRAST:  30mL OMNIPAQUE IOHEXOL 350 MG/ML SOLN COMPARISON:  None FINDINGS: Cardiovascular: Atherosclerotic calcifications aorta, coronary arteries, and proximal great vessels. Aberrant origin of the RIGHT subclavian artery. No evidence of aortic aneurysm or  dissection. Heart appears mildly enlarged. Minimal pericardial fluid. Pulmonary arteries adequately opacified. Several tiny filling defects are seen within RIGHT upper and RIGHT middle lobe pulmonary arteries. In addition, eccentric filling defects are seen within the LEFT lower lobe pulmonary artery and the RIGHT upper lobe pulmonary artery. Findings are consistent with pulmonary embolism, though this may represent a combination of acute and old emboli. Mediastinum/Nodes: Base of cervical region normal appearance. Esophagus unremarkable. No thoracic adenopathy. Lungs/Pleura: Dependent atelectasis bilaterally. No pulmonary infiltrate, pleural effusion, or pneumothorax. Upper Abdomen: Visualized upper abdomen unremarkable Musculoskeletal: Osseous structures normal appearance. Review of the MIP images confirms the above findings. IMPRESSION: Few scattered tiny pulmonary emboli are identified with additional eccentric filling defects which may represent sequela of new or old pulmonary emboli. Scattered atherosclerotic calcifications including coronary arteries. Aberrant origin of the RIGHT subclavian artery. Dependent atelectasis BILATERAL lower lobes. Aortic Atherosclerosis (ICD10-I70.0). Critical Value/emergent results were called by telephone at the time of interpretation on 04/28/2021 at 5:26 pm to provider Isla Pence MD, who verbally acknowledged these results. Electronically Signed   By: Lavonia Dana M.D.   On: 04/28/2021 17:26   DG Chest Port 1 View  Result Date: 04/28/2021 CLINICAL DATA:  Shortness of breath EXAM: PORTABLE CHEST 1 VIEW COMPARISON:  04/09/2013, 02/22/2016 report FINDINGS: No focal opacity or pleural effusion. Cardiomediastinal silhouette within normal limits. Aortic atherosclerosis. No pneumothorax. IMPRESSION: No active disease. Electronically Signed   By: Donavan Foil M.D.   On: 04/28/2021 15:32        Scheduled Meds:  atorvastatin  40 mg Oral Daily   darifenacin  7.5 mg Oral  Daily   pantoprazole  40 mg Oral Daily   verapamil  180 mg Oral QHS   Continuous Infusions:  heparin 750 Units/hr (04/29/21 1210)     LOS: 2 days    Time spent: 69min  Domenic Polite, MD [facility]   04/30/2021, 11:11 AM

## 2021-04-30 NOTE — Progress Notes (Signed)
ANTICOAGULATION CONSULT NOTE - Follow Up Consult  Pharmacy Consult for heparin Indication: pulmonary embolus in setting of acute bifrontal hemorrhagic contusions and subarachnoid hemorrhage.  Heparin DW: 50.8 kg  Labs: Recent Labs    04/28/21 1519 04/29/21 0924 04/29/21 1122 04/29/21 1643 04/30/21 0101  HGB 11.3*  --  11.6*  --  11.9*  HCT 35.2*  --  35.6*  --  36.2  PLT 218  --  215  --  247  HEPARINUNFRC  --  0.24*  --  0.38 0.37  CREATININE 1.01*  --   --   --  1.05*     Estimated Creatinine Clearance: 26.6 mL/min (A) (by C-G formula based on SCr of 1.05 mg/dL (H)).   Medications:  Medications Prior to Admission  Medication Sig Dispense Refill Last Dose   atorvastatin (LIPITOR) 40 MG tablet Take 40 mg by mouth daily.   04/27/2021   feeding supplement (ENSURE ENLIVE / ENSURE PLUS) LIQD Take 237 mLs by mouth 2 (two) times daily between meals. 237 mL 12 04/28/2021   guaiFENesin (MUCINEX) 600 MG 12 hr tablet Take by mouth 2 (two) times daily.      iron polysaccharides (NIFEREX) 150 MG capsule Take 150 mg by mouth daily.   04/28/2021   omeprazole (PRILOSEC) 20 MG capsule TAKE 1 CAPSULE (20 MG TOTAL) BY MOUTH 2 (TWO) TIMES DAILY BEFORE A MEAL. 180 capsule 3 04/28/2021   solifenacin (VESICARE) 5 MG tablet Take 1 tablet (5 mg total) by mouth daily. 90 tablet 3 04/28/2021   verapamil (CALAN-SR) 180 MG CR tablet Take 1 tablet (180 mg total) by mouth at bedtime. 90 tablet 0 04/27/2021   VITAMIN D PO 1,000 Int'l Units/day.   04/28/2021   aspirin EC 81 MG tablet Take 81 mg by mouth daily. Swallow whole.      atorvastatin (LIPITOR) 20 MG tablet TAKE 1 TABLET AT BEDTIME. (Patient not taking: Reported on 04/28/2021) 90 tablet 0 Not Taking   levofloxacin (LEVAQUIN) 500 MG tablet Take 500 mg by mouth daily. (Patient not taking: Reported on 04/28/2021)   Completed Course   trimethoprim (TRIMPEX) 100 MG tablet Take 1 tablet (100 mg total) by mouth daily. (Patient not taking: Reported on  04/28/2021) 90 tablet 3 Not Taking   Scheduled:   atorvastatin  40 mg Oral Daily   darifenacin  7.5 mg Oral Daily   pantoprazole  40 mg Oral Daily   verapamil  180 mg Oral QHS    Assessment: 87yo female found to have scattered small PE complicated by recent traumatic ICH and now w/ CT concerning for acute anterior right frontal lobe hemorrhage; neurosurgery has reviewed pt and agrees with low-goal heparin infusion for PE.  Heparin level of 0.37 is therapeutic on heparin 750 units/hr. Level drawn appropriately. Per RN no issues with IV infusion or access. No bleeding noted. Hgb and plts stable.  Goal of Therapy:  Heparin level 0.3-0.5 units/ml Monitor platelets by anticoagulation protocol: Yes   Plan:  Continue heparin 750 units/hr Move to checking daily levels and CBC while on heparin Can transition to oral AC if repeat head CT stable per NSRGY- repeat CT scheduled for today 04/30/21  Cathrine Muster, PharmD PGY2 Cardiology Pharmacy Resident 04/30/2021  6:45 AM  Please check [email or web address] for unit-specific pharmacy phone numbers.

## 2021-05-01 DIAGNOSIS — I629 Nontraumatic intracranial hemorrhage, unspecified: Secondary | ICD-10-CM | POA: Diagnosis not present

## 2021-05-01 LAB — CBC
HCT: 35.6 % — ABNORMAL LOW (ref 36.0–46.0)
Hemoglobin: 11.5 g/dL — ABNORMAL LOW (ref 12.0–15.0)
MCH: 30.5 pg (ref 26.0–34.0)
MCHC: 32.3 g/dL (ref 30.0–36.0)
MCV: 94.4 fL (ref 80.0–100.0)
Platelets: 249 10*3/uL (ref 150–400)
RBC: 3.77 MIL/uL — ABNORMAL LOW (ref 3.87–5.11)
RDW: 14 % (ref 11.5–15.5)
WBC: 6.2 10*3/uL (ref 4.0–10.5)
nRBC: 0 % (ref 0.0–0.2)

## 2021-05-01 LAB — BASIC METABOLIC PANEL
Anion gap: 6 (ref 5–15)
BUN: 15 mg/dL (ref 8–23)
CO2: 26 mmol/L (ref 22–32)
Calcium: 11.4 mg/dL — ABNORMAL HIGH (ref 8.9–10.3)
Chloride: 106 mmol/L (ref 98–111)
Creatinine, Ser: 1.11 mg/dL — ABNORMAL HIGH (ref 0.44–1.00)
GFR, Estimated: 48 mL/min — ABNORMAL LOW (ref >60)
Glucose, Bld: 99 mg/dL (ref 70–99)
Potassium: 3.8 mmol/L (ref 3.5–5.1)
Sodium: 138 mmol/L (ref 135–145)

## 2021-05-01 LAB — HEPARIN LEVEL (UNFRACTIONATED): Heparin Unfractionated: 0.52 IU/mL (ref 0.30–0.70)

## 2021-05-01 MED ORDER — APIXABAN 5 MG PO TABS
5.0000 mg | ORAL_TABLET | Freq: Two times a day (BID) | ORAL | Status: DC
  Administered 2021-05-01 – 2021-05-06 (×9): 5 mg via ORAL
  Filled 2021-05-01 (×10): qty 1

## 2021-05-01 MED ORDER — HEPARIN (PORCINE) 25000 UT/250ML-% IV SOLN
700.0000 [IU]/h | INTRAVENOUS | Status: AC
  Filled 2021-05-01: qty 250

## 2021-05-01 NOTE — Discharge Instructions (Addendum)
Information on my medicine - ELIQUIS (apixaban)  This medication education was reviewed with me or my healthcare representative as part of my discharge preparation.    Why was Eliquis prescribed for you? Eliquis was prescribed to treat blood clots that may have been found in the veins of your legs (deep vein thrombosis) or in your lungs (pulmonary embolism) and to reduce the risk of them occurring again.  What do You need to know about Eliquis ? The dose is ONE 5 mg tablet taken TWICE daily.  Eliquis may be taken with or without food.   Try to take the dose about the same time in the morning and in the evening. If you have difficulty swallowing the tablet whole please discuss with your pharmacist how to take the medication safely.  Take Eliquis exactly as prescribed and DO NOT stop taking Eliquis without talking to the doctor who prescribed the medication.  Stopping may increase your risk of developing a new blood clot.  Refill your prescription before you run out.  After discharge, you should have regular check-up appointments with your healthcare provider that is prescribing your Eliquis.    What do you do if you miss a dose? If a dose of ELIQUIS is not taken at the scheduled time, take it as soon as possible on the same day and twice-daily administration should be resumed. The dose should not be doubled to make up for a missed dose.  Important Safety Information A possible side effect of Eliquis is bleeding. You should call your healthcare provider right away if you experience any of the following: Bleeding from an injury or your nose that does not stop. Unusual colored urine (red or dark brown) or unusual colored stools (red or black). Unusual bruising for unknown reasons. A serious fall or if you hit your head (even if there is no bleeding).  Some medicines may interact with Eliquis and might increase your risk of bleeding or clotting while on Eliquis. To help avoid this,  consult your healthcare provider or pharmacist prior to using any new prescription or non-prescription medications, including herbals, vitamins, non-steroidal anti-inflammatory drugs (NSAIDs) and supplements.  This website has more information on Eliquis (apixaban): http://www.eliquis.com/eliquis/home  ============================================  Pulmonary Embolism    A pulmonary embolism (PE) is a sudden blockage or decrease of blood flow in one or both lungs. Most blockages come from a blood clot that forms in the vein of a lower leg, thigh, or arm (deep vein thrombosis, DVT) and travels to the lungs. A clot is blood that has thickened into a gel or solid. PE is a dangerous and life-threatening condition that needs to be treated right away.  What are the causes? This condition is usually caused by a blood clot that forms in a vein and moves to the lungs. In rare cases, it may be caused by air, fat, part of a tumor, or other tissue that moves through the veins and into the lungs.  What increases the risk? The following factors may make you more likely to develop this condition: Experiencing a traumatic injury, such as breaking a hip or leg. Having: A spinal cord injury. Orthopedic surgery, especially hip or knee replacement. Any major surgery. A stroke. DVT. Blood clots or blood clotting disease. Long-term (chronic) lung or heart disease. Cancer treated with chemotherapy. A central venous catheter. Taking medicines that contain estrogen. These include birth control pills and hormone replacement therapy. Being: Pregnant. In the period of time after your baby is delivered (  postpartum). Older than age 8. Overweight. A smoker, especially if you have other risks.  What are the signs or symptoms? Symptoms of this condition usually start suddenly and include: Shortness of breath during activity or at rest. Coughing, coughing up blood, or coughing up blood-tinged mucus. Chest pain  that is often worse with deep breaths. Rapid or irregular heartbeat. Feeling light-headed or dizzy. Fainting. Feeling anxious. Fever. Sweating. Pain and swelling in a leg. This is a symptom of DVT, which can lead to PE. How is this diagnosed? This condition may be diagnosed based on: Your medical history. A physical exam. Blood tests. CT pulmonary angiogram. This test checks blood flow in and around your lungs. Ventilation-perfusion scan, also called a lung VQ scan. This test measures air flow and blood flow to the lungs. An ultrasound of the legs.  How is this treated? Treatment for this condition depends on many factors, such as the cause of your PE, your risk for bleeding or developing more clots, and other medical conditions you have. Treatment aims to remove, dissolve, or stop blood clots from forming or growing larger. Treatment may include: Medicines, such as: Blood thinning medicines (anticoagulants) to stop clots from forming. Medicines that dissolve clots (thrombolytics). Procedures, such as: Using a flexible tube to remove a blood clot (embolectomy) or to deliver medicine to destroy it (catheter-directed thrombolysis). Inserting a filter into a large vein that carries blood to the heart (inferior vena cava). This filter (vena cava filter) catches blood clots before they reach the lungs. Surgery to remove the clot (surgical embolectomy). This is rare. You may need a combination of immediate, long-term (up to 3 months after diagnosis), and extended (more than 3 months after diagnosis) treatments. Your treatment may continue for several months (maintenance therapy). You and your health care provider will work together to choose the treatment program that is best for you.  Follow these instructions at home: Medicines Take over-the-counter and prescription medicines only as told by your health care provider. If you are taking an anticoagulant medicine: Take the medicine every  day at the same time each day. Understand what foods and drugs interact with your medicine. Understand the side effects of this medicine, including excessive bruising or bleeding. Ask your health care provider or pharmacist about other side effects.  General instructions Wear a medical alert bracelet or carry a medical alert card that says you have had a PE and lists what medicines you take. Ask your health care provider when you may return to your normal activities. Avoid sitting or lying for a long time without moving. Maintain a healthy weight. Ask your health care provider what weight is healthy for you. Do not use any products that contain nicotine or tobacco, such as cigarettes, e-cigarettes, and chewing tobacco. If you need help quitting, ask your health care provider. Talk with your health care provider about any travel plans. It is important to make sure that you are still able to take your medicine while on trips. Keep all follow-up visits as told by your health care provider. This is important.  Contact a health care provider if: You missed a dose of your blood thinner medicine.  Get help right away if: You have: New or increased pain, swelling, warmth, or redness in an arm or leg. Numbness or tingling in an arm or leg. Shortness of breath during activity or at rest. A fever. Chest pain. A rapid or irregular heartbeat. A severe headache. Vision changes. A serious fall or accident,  or you hit your head. Stomach (abdominal) pain. Blood in your vomit, stool, or urine. A cut that will not stop bleeding. You cough up blood. You feel light-headed or dizzy. You cannot move your arms or legs. You are confused or have memory loss.  These symptoms may represent a serious problem that is an emergency. Do not wait to see if the symptoms will go away. Get medical help right away. Call your local emergency services (911 in the [place] Do not drive yourself to the hospital. Summary A  pulmonary embolism (PE) is a sudden blockage or decrease of blood flow in one or both lungs. PE is a dangerous and life-threatening condition that needs to be treated right away. Treatments for this condition usually include medicines to thin your blood (anticoagulants) or medicines to break apart blood clots (thrombolytics). If you are given blood thinners, it is important to take the medicine every day at the same time each day. Understand what foods and drugs interact with any medicines that you are taking. If you have signs of PE or DVT, call your local emergency services (911 in the [place] This information is not intended to replace advice given to you by your health care provider. Make sure you discuss any questions you have with your health care provider. Document Revised: 02/28/2018 Document Reviewed: 02/28/2018 Elsevier Patient Education  2020 [employer].

## 2021-05-01 NOTE — Evaluation (Signed)
Physical Therapy Evaluation Patient Details Name: Amanda [name] MRN: [id number] DOB: 02-02-1931 Today's Date: 05/01/2021  History of Present Illness  87 y/o female presented to [facility] ED on 11/23 for complaints of O2 sats in upper 80's overnight. Recently diagnosed with PNA. Admitted 10/6-10/24/22 for signifcant fall with acute bifrontal hemorrhage and small SAH and discharged to SNF. CTA chest revealed small scatter pulmonary emboli. [place] concerning for acute hemorrhage in anterior R frontal lobe. Repeat CTH showed interval resolution of hyperdensity in R frontal lobe which may be resolving SAH. Transferred to [facility]. PMH: dementia, HTN, CVA, CAD  Clinical Impression  Patient admitted with above diagnosis. Patient presents with generalized weakness, decreased activity tolerance, impaired balance, impaired functional mobility and impaired cognition. Patient requires maxA+2 for bed mobility and modA to maintain static sitting. MaxA+2 to attempt standing but patient immediately wanting to return to sitting. Requires up to 3L O2 [place] to maintain spO2 >90% with mobility. Patient will benefit from skilled PT services during acute stay to address listed deficits. Patient admitted from SNF and recommend return to SNF for further therapies to maximize functional mobility.        Recommendations for follow up therapy are one component of a multi-disciplinary discharge planning process, led by the attending physician.  Recommendations may be updated based on patient status, additional functional criteria and insurance authorization.  Follow Up Recommendations Skilled nursing-short term rehab (<3 hours/day)    Assistance Recommended at Discharge Frequent or constant Supervision/Assistance  Functional Status Assessment Patient has had a recent decline in their functional status and/or demonstrates limited ability to make significant improvements in function in a reasonable and predictable amount of time  Equipment  Recommendations  Other (comment) (defer to next venue of care)    Recommendations for Other Services       Precautions / Restrictions Precautions Precautions: Fall Restrictions Weight Bearing Restrictions: No      Mobility  Bed Mobility Overal bed mobility: Needs Assistance Bed Mobility: Supine to Sit;Sit to Supine     Supine to sit: Max assist;+2 for safety/equipment Sit to supine: Max assist;+2 for safety/equipment   General bed mobility comments: pt able to initiate lifting trunk and lowering trunk    Transfers Overall transfer level: Needs assistance   Transfers: Sit to/from Stand Sit to Stand: Max assist;+2 physical assistance;+2 safety/equipment           General transfer comment: Pt stood with max A +2, but unable to maintain standing and attempted to spontaneously return to sitting    Ambulation/Gait                  Stairs            Wheelchair Mobility    Modified Rankin (Stroke Patients Only)       Balance Overall balance assessment: Needs assistance Sitting-balance support: Feet supported Sitting balance-Leahy Scale: Poor Sitting balance - Comments: required mod A to sit EOB statically Postural control: Posterior lean;Right lateral lean Standing balance support: Bilateral upper extremity supported Standing balance-Leahy Scale: Zero Standing balance comment: max A +2 for very brief moment                             Pertinent Vitals/Pain Pain Assessment: Faces Faces Pain Scale: No hurt Pain Intervention(s): Monitored during session    Home Living Family/patient expects to be discharged to:: Skilled nursing facility  Additional Comments: Pt admitted from SNF where she went after last admission for rehab.  Unsure of plans at discharge    Prior Function Prior Level of Function : Patient poor historian/Family not available             Mobility Comments: Pt reports she was walking at  SNF, but no family present to confirm info ADLs Comments: Pt unable to provide info re: ADLs     Hand Dominance   Dominant Hand: Right    Extremity/Trunk Assessment   Upper Extremity Assessment Upper Extremity Assessment: Defer to OT evaluation    Lower Extremity Assessment Lower Extremity Assessment: Generalized weakness    Cervical / Trunk Assessment Cervical / Trunk Assessment: Kyphotic  Communication   Communication: No difficulties  Cognition Arousal/Alertness: Lethargic Behavior During Therapy: Flat affect Overall Cognitive Status: Impaired/Different from baseline Area of Impairment: Orientation;Attention;Memory;Following commands;Safety/judgement;Awareness;Problem solving                 Orientation Level: Disoriented to;Time;Situation Current Attention Level: Focused Memory: Decreased short-term memory Following Commands: Follows one step commands inconsistently;Follows one step commands with increased time   Awareness: Intellectual Problem Solving: Slow processing;Decreased initiation;Difficulty sequencing;Requires verbal cues;Requires tactile cues General Comments: Pt lethargic throughout session        General Comments General comments (skin integrity, edema, etc.): sp02 95% on 1L at rest.   Fi02 84% on RA while seated EOB.  she required 3L to improve sp02 to mid 90s while EOB.  DOE 3/4    Exercises     Assessment/Plan    PT Assessment Patient needs continued PT services  PT Problem List Decreased strength;Decreased balance;Decreased activity tolerance;Decreased mobility;Decreased cognition;Decreased safety awareness;Decreased knowledge of use of DME;Decreased knowledge of precautions;Cardiopulmonary status limiting activity       PT Treatment Interventions DME instruction;Gait training;Functional mobility training;Therapeutic activities;Therapeutic exercise;Balance training;Patient/family education    PT Goals (Current goals can be found in the  Care Plan section)  Acute Rehab PT Goals Patient Stated Goal: did not state PT Goal Formulation: Patient unable to participate in goal setting Time For Goal Achievement: 05/15/21 Potential to Achieve Goals: Fair    Frequency Min 2X/week   Barriers to discharge        Co-evaluation PT/OT/SLP Co-Evaluation/Treatment: Yes Reason for Co-Treatment: Complexity of the patient's impairments (multi-system involvement);Necessary to address cognition/behavior during functional activity;For patient/therapist safety;To address functional/ADL transfers PT goals addressed during session: Mobility/safety with mobility;Balance OT goals addressed during session: Strengthening/ROM;ADL's and self-care       AM-PAC PT "6 Clicks" Mobility  Outcome Measure Help needed turning from your back to your side while in a flat bed without using bedrails?: Total Help needed moving from lying on your back to sitting on the side of a flat bed without using bedrails?: Total Help needed moving to and from a bed to a chair (including a wheelchair)?: Total Help needed standing up from a chair using your arms (e.g., wheelchair or bedside chair)?: Total Help needed to walk in hospital room?: Total Help needed climbing 3-5 steps with a railing? : Total 6 Click Score: 6    End of Session Equipment Utilized During Treatment: Oxygen Activity Tolerance: Patient limited by lethargy Patient left: in bed;with call bell/phone within reach;with bed alarm set Nurse Communication: Mobility status;Other (comment) (oxygen requirements) PT Visit Diagnosis: Muscle weakness (generalized) (M62.81);Unsteadiness on feet (R26.81);Difficulty in walking, not elsewhere classified (R26.2)    Time: 1203-1222 PT Time Calculation (min) (ACUTE ONLY): 19 min   Charges:  PT Evaluation $PT Eval Moderate Complexity: 1 Mod          Deniqua Perry A. Gilford Rile PT, DPT Acute Rehabilitation Services Pager [phone] Office  [phone]   Linna Hoff 05/01/2021, 1:40 PM

## 2021-05-01 NOTE — Progress Notes (Signed)
NEUROSURGERY PROGRESS NOTE   Doing well, sitting up in bed eating and conversing. S/p CHI on heparin for PE. No acute events overnight.   Temp:  [97.6 F (36.4 C)-98.2 F (36.8 C)] 97.6 F (36.4 C) (11/26 0808) Pulse Rate:  [60-63] 63 (11/26 0808) Resp:  [13-18] 16 (11/26 0808) BP: (117-136)/(64-82) 122/70 (11/26 0808) SpO2:  [91 %-95 %] 91 % (11/26 0808)   Eleonore Chiquito, NP 05/01/2021 10:27 AM

## 2021-05-01 NOTE — Evaluation (Signed)
Occupational Therapy Evaluation Patient Details Name: Amanda KIMMI ACOCELLA MRN: [id number] DOB: 07-Jan-1931 Today's Date: 05/01/2021   History of Present Illness 87 y/o female presented to [facility] ED on 11/23 for complaints of O2 sats in upper 80's overnight. Recently diagnosed with PNA. Admitted 10/6-10/24/22 for signifcant fall with acute bifrontal hemorrhage and small SAH and discharged to SNF. CTA chest revealed small scatter pulmonary emboli. [place] concerning for acute hemorrhage in anterior R frontal lobe. Repeat CTH showed interval resolution of hyperdensity in R frontal lobe which may be resolving SAH. Transferred to [facility]. PMH: dementia, HTN, CVA, CAD   Clinical Impression   Pt admitted with above. She demonstrates the below listed deficits and will benefit from continued OT to maximize safety and independence with BADLs.  Pt presents to OT with generalized weakness, decreased activity tolerance, impaired balance, impaired cognition.  She required max  A for bed mobility, and mod A to maintain static sitting EOB.  She is able to feed self with min A, total A for remainder of ADLs due to fatigue/lethargy.  She follows simple commands inconsistently.  She was admitted from SNF and recommend return to SNF.        Recommendations for follow up therapy are one component of a multi-disciplinary discharge planning process, led by the attending physician.  Recommendations may be updated based on patient status, additional functional criteria and insurance authorization.   Follow Up Recommendations  Skilled nursing-short term rehab (<3 hours/day)    Assistance Recommended at Discharge Frequent or constant Supervision/Assistance  Functional Status Assessment  Patient has had a recent decline in their functional status and demonstrates the ability to make significant improvements in function in a reasonable and predictable amount of time.  Equipment Recommendations  BSC/3in1;Wheelchair (measurements OT)     Recommendations for Other Services       Precautions / Restrictions Precautions Precautions: Fall      Mobility Bed Mobility Overal bed mobility: Needs Assistance Bed Mobility: Sit to Supine;Supine to Sit     Supine to sit: Max assist;+2 for safety/equipment Sit to supine: Max assist;+2 for safety/equipment   General bed mobility comments: pt able to initiate lifting trunk and lowering trunk    Transfers Overall transfer level: Needs assistance   Transfers: Sit to/from Stand Sit to Stand: Max assist;+2 physical assistance;+2 safety/equipment           General transfer comment: Pt stood with max A +2, but unable to maintain standing and attempted to spontaneously return to sitting      Balance Overall balance assessment: Needs assistance Sitting-balance support: Feet supported Sitting balance-Leahy Scale: Poor Sitting balance - Comments: required mod A to sit EOB statically Postural control: Posterior lean;Right lateral lean Standing balance support: Bilateral upper extremity supported Standing balance-Leahy Scale: Zero Standing balance comment: max A +2 for very brief moment                           ADL either performed or assessed with clinical judgement   ADL Overall ADL's : Needs assistance/impaired Eating/Feeding: Minimal assistance;Bed level   Grooming: Wash/dry hands;Wash/dry face;Brushing hair;Maximal assistance;Sitting   Upper Body Bathing: Total assistance;Sitting   Lower Body Bathing: Total assistance;Bed level   Upper Body Dressing : Total assistance;Sitting   Lower Body Dressing: Total assistance;Bed level   Toilet Transfer: Total assistance Toilet Transfer Details (indicate cue type and reason): pt unable Toileting- Clothing Manipulation and Hygiene: Total assistance;Bed level  Functional mobility during ADLs: Maximal assistance;Total assistance;+2 for physical assistance;+2 for safety/equipment (bed mobility and sit to  stand only) General ADL Comments: limited by lethargy     Vision Patient Visual Report: No change from baseline Additional Comments: will benefit from testing once ability to participate improves     Perception     Praxis      Pertinent Vitals/Pain Pain Assessment: Faces Faces Pain Scale: No hurt     Hand Dominance Right   Extremity/Trunk Assessment Upper Extremity Assessment Upper Extremity Assessment: Generalized weakness   Lower Extremity Assessment Lower Extremity Assessment: Defer to PT evaluation   Cervical / Trunk Assessment Cervical / Trunk Assessment: Kyphotic   Communication Communication Communication: No difficulties   Cognition Arousal/Alertness: Lethargic Behavior During Therapy: Flat affect Overall Cognitive Status: Impaired/Different from baseline Area of Impairment: Orientation;Attention;Memory;Following commands;Safety/judgement;Awareness;Problem solving                 Orientation Level: Disoriented to;Time;Situation Current Attention Level: Focused Memory: Decreased short-term memory Following Commands: Follows one step commands inconsistently;Follows one step commands with increased time     Problem Solving: Slow processing;Decreased initiation;Difficulty sequencing;Requires verbal cues;Requires tactile cues General Comments: Pt lethargic throughout session     General Comments  sp02 95% on 1L at rest.   Fi02 84% on RA while seated EOB.  she required 3L to improve sp02 to mid 90s while EOB.  DOE 3/4    Exercises     Shoulder Instructions      Home Living Family/patient expects to be discharged to:: Skilled nursing facility                                 Additional Comments: Pt admitted from SNF where she went after last admission for rehab.  Unsure of plans at discharge      Prior Functioning/Environment Prior Level of Function : Patient poor historian/Family not available             Mobility Comments: Pt  reports she was walking at SNF, but no family present to confirm info ADLs Comments: Pt unable to provide info re: ADLs        OT Problem List: Decreased strength;Decreased range of motion;Decreased activity tolerance;Impaired balance (sitting and/or standing);Decreased cognition;Decreased safety awareness;Decreased knowledge of use of DME or AE;Cardiopulmonary status limiting activity      OT Treatment/Interventions: Self-care/ADL training;Therapeutic exercise;Energy conservation;DME and/or AE instruction;Therapeutic activities;Cognitive remediation/compensation;Patient/family education;Balance training    OT Goals(Current goals can be found in the care plan section) Acute Rehab OT Goals Patient Stated Goal: to lie back down OT Goal Formulation: With patient Time For Goal Achievement: 05/14/21 Potential to Achieve Goals: Good ADL Goals Pt Will Perform Eating: with modified independence;sitting Pt Will Perform Grooming: with mod assist Pt Will Perform Upper Body Bathing: with min assist;sitting Pt Will Perform Lower Body Bathing: with mod assist;sit to/from stand Pt Will Transfer to Toilet: stand pivot transfer;with mod assist;bedside commode Pt Will Perform Toileting - Clothing Manipulation and hygiene: with mod assist;sit to/from stand  OT Frequency: Min 2X/week   Barriers to D/C:            Co-evaluation PT/OT/SLP Co-Evaluation/Treatment: Yes Reason for Co-Treatment: For patient/therapist safety;To address functional/ADL transfers;Complexity of the patient's impairments (multi-system involvement);Necessary to address cognition/behavior during functional activity   OT goals addressed during session: Strengthening/ROM;ADL's and self-care      AM-PAC OT "6 Clicks" Daily Activity  Outcome Measure Help from another person eating meals?: A Little Help from another person taking care of personal grooming?: A Lot Help from another person toileting, which includes using toliet,  bedpan, or urinal?: Total Help from another person bathing (including washing, rinsing, drying)?: Total Help from another person to put on and taking off regular upper body clothing?: Total Help from another person to put on and taking off regular lower body clothing?: Total 6 Click Score: 9   End of Session Equipment Utilized During Treatment: Oxygen Nurse Communication: Mobility status  Activity Tolerance: Patient limited by lethargy Patient left: in bed;with call bell/phone within reach;with bed alarm set  OT Visit Diagnosis: Unsteadiness on feet (R26.81);Cognitive communication deficit (R41.841)                Time: 8101-7510 OT Time Calculation (min): 19 min Charges:  OT General Charges $OT Visit: 1 Visit OT Evaluation $OT Eval Moderate Complexity: 1 Mod  Nilsa Nutting., OTR/L Acute Rehabilitation Services Pager [phone] Office [phone]   Lucille Passy M 05/01/2021, 1:09 PM

## 2021-05-01 NOTE — Progress Notes (Signed)
ANTICOAGULATION CONSULT NOTE - Follow Up Consult  Pharmacy Consult for heparin Indication: pulmonary embolus in setting of acute bifrontal hemorrhagic contusions and recent subarachnoid hemorrhage.  Heparin DW: 50.8 kg  Labs: Recent Labs    04/29/21 0223 04/29/21 0924 04/29/21 1122 04/29/21 1643 04/30/21 0101 05/01/21 0224  HGB 10.7*  --  11.6*  --  11.9* 11.5*  HCT 32.4*  --  35.6*  --  36.2 35.6*  PLT 208  --  215  --  247 249  HEPARINUNFRC  --    < >  --  0.38 0.37 0.52  CREATININE 0.89  --   --   --  1.05* 1.11*   < > = values in this interval not displayed.     Estimated Creatinine Clearance: 25.2 mL/min (A) (by C-G formula based on SCr of 1.11 mg/dL (H)).   Medications:  Medications Prior to Admission  Medication Sig Dispense Refill Last Dose   atorvastatin (LIPITOR) 40 MG tablet Take 40 mg by mouth daily.   04/27/2021   feeding supplement (ENSURE ENLIVE / ENSURE PLUS) LIQD Take 237 mLs by mouth 2 (two) times daily between meals. 237 mL 12 04/28/2021   guaiFENesin (MUCINEX) 600 MG 12 hr tablet Take by mouth 2 (two) times daily.      iron polysaccharides (NIFEREX) 150 MG capsule Take 150 mg by mouth daily.   04/28/2021   omeprazole (PRILOSEC) 20 MG capsule TAKE 1 CAPSULE (20 MG TOTAL) BY MOUTH 2 (TWO) TIMES DAILY BEFORE A MEAL. 180 capsule 3 04/28/2021   solifenacin (VESICARE) 5 MG tablet Take 1 tablet (5 mg total) by mouth daily. 90 tablet 3 04/28/2021   verapamil (CALAN-SR) 180 MG CR tablet Take 1 tablet (180 mg total) by mouth at bedtime. 90 tablet 0 04/27/2021   VITAMIN D PO 1,000 Int'l Units/day.   04/28/2021   aspirin EC 81 MG tablet Take 81 mg by mouth daily. Swallow whole.      atorvastatin (LIPITOR) 20 MG tablet TAKE 1 TABLET AT BEDTIME. (Patient not taking: Reported on 04/28/2021) 90 tablet 0 Not Taking   levofloxacin (LEVAQUIN) 500 MG tablet Take 500 mg by mouth daily. (Patient not taking: Reported on 04/28/2021)   Completed Course   trimethoprim (TRIMPEX)  100 MG tablet Take 1 tablet (100 mg total) by mouth daily. (Patient not taking: Reported on 04/28/2021) 90 tablet 3 Not Taking   Scheduled:   atorvastatin  40 mg Oral Daily   darifenacin  7.5 mg Oral Daily   pantoprazole  40 mg Oral Daily   verapamil  180 mg Oral QHS    Assessment: 87yo female found to have scattered small PE complicated by 38/1 BL frontal lobe hemorrhagic intraparenchymal and small SAH due to fall.  11/23  CT concerning for acute anterior right frontal lobe hemorrhage; neurosurgery has reviewed pt and agrees with low-goal heparin infusion for PE.  Heparin level 0.52 is just supratherapeutic on 750 units/hr. H/H, plt stable.  No issues with infusion or bleeding per RN.  Goal of Therapy:  Heparin level 0.3-0.5 units/ml Monitor platelets by anticoagulation protocol: Yes   Plan:  Decrease heparin to 700 units/hr Monitor daily HL, CBC/plt Monitor for signs/symptoms of bleeding  F/u transition to oral AC if CTH stable per [place], PharmD, BCPS, [place] Pharmacist  Please check AMION for all [place] phone numbers After 10:00 PM, call [place]

## 2021-05-01 NOTE — Progress Notes (Addendum)
PROGRESS NOTE    Amanda [name]  ZOX:[id number] DOB: [birth year] DOA: 04/28/2021 PCP: [name], DO  Brief Narrative: 89/F with history of dementia, hypertension tension, dyslipidemia who was recently discharged to SNF from [facility] following hospitalization for traumatic intracranial hemorrhage following a fall she presented to the ED with complaints of shortness of breath, was noted to be mildly hypoxic to 88% on room air on arrival -She had a CTA chest in the ED which noted small scattered bilateral pulmonary emboli and atelectasis, CT head noted acute hemorrhage in the anterior right frontal lobe, she was subsequently transferred to [facility] for neurosurgical evaluation. -Seen by neurosurgery yesterday and started on low-dose heparin drip without bolus  Assessment & Plan:   Intracranial hemorrhage -Recent admission secondary to traumatic small SAH following a fall, unclear if she had another fall again or if it was spontaneous this time, ?  Amyloid angiopathy -Neurosurgery consulting, low started on low-dose heparin infusion for PE last evening, repeat CT head noted to be stable/improving, will transition to oral eliquis today, d/w neurosurgery, will use 5mg  BID and skip higher first week doing in this setting ( tiny PE and SAH) -Remains stable clinically -updated daughter regarding the risk of rebleeding, clinical deterioration, she confirms DNR    Pulmonary embolism -new diagnosis, CTA chest in the ED yesterday noted few scattered tiny pulmonary emboli extending filling defects. -Started on low-dose heparin drip without bolus on admission per neurosurgical recommendations, repeat CT head is stable, transition to oral anticoagulation as above   Acute hypoxic respiratory failure -Due to above, as well as evidence of atelectasis on imaging,  -encourage incentive spirometry -Out of bed as tolerated  Dementia -Stable   Hyperlipidemia -Continue with statin   GERD - Continue with PPI    Hypokalemia -Mild, repleted   Hypertension -continue with verapamil  DVT prophylaxis: SCDs Code Status: DNR Family Communication: Updated daughter yesterday Disposition Plan: Possibly home with home health in 48 hours Status is: Inpatient  Remains inpatient appropriate because: Severity of illness  Consultants:  Neurosurgery  Procedures:   Antimicrobials:    Subjective: -Feels okay, denies any complaints  Objective: Vitals:   04/30/21 2128 04/30/21 2327 05/01/21 0347 05/01/21 0808  BP: 135/82 117/64 120/70 122/70  Pulse:  60 63 63  Resp:  14 13 16   Temp:  97.9 F (36.6 C) 97.7 F (36.5 C) 97.6 F (36.4 C)  TempSrc:  Oral Oral Oral  SpO2:  95% 95% 91%  Weight:      Height:        Intake/Output Summary (Last 24 hours) at 05/01/2021 1140 Last data filed at 05/01/2021 0435 Gross per 24 hour  Intake 400 ml  Output 650 ml  Net -250 ml   Filed Weights   04/28/21 1507  Weight: 50.8 kg    Examination:  General exam: Pleasant elderly female laying in bed, AAO x2, no distress, moderate cognitive deficits noted CVS: S1-S2, regular rate rhythm Lungs: Clear bilaterally Abdomen: Soft, nontender, bowel sounds present Extremities: No edema  Neuro: Moves all extremities, no localizing signs, pupils are equal and reactive Skin: No rashes Psychiatry: Poor insight and judgment.   Data Reviewed:   CBC: Recent Labs  Lab 04/28/21 1519 04/29/21 0223 04/29/21 1122 04/30/21 0101 05/01/21 0224  WBC 6.1 6.6 5.4 6.5 6.2  NEUTROABS 4.0  --   --   --   --   HGB 11.3* 10.7* 11.6* 11.9* 11.5*  HCT 35.2* 32.4* 35.6* 36.2 35.6*  MCV 98.1  95.0 95.4 94.8 94.4  PLT 218 208 215 247 496   Basic Metabolic Panel: Recent Labs  Lab 04/28/21 1519 04/29/21 0223 04/30/21 0101 05/01/21 0224  NA 141 140 138 138  K 3.4* 3.7 4.0 3.8  CL 108 109 105 106  CO2 26 25 26 26   GLUCOSE 124* 81 87 99  BUN 16 12 13 15   CREATININE 1.01* 0.89 1.05* 1.11*  CALCIUM 10.5* 10.6* 11.5*  11.4*   GFR: Estimated Creatinine Clearance: 25.2 mL/min (A) (by C-G formula based on SCr of 1.11 mg/dL (H)). Liver Function Tests: No results for input(s): AST, ALT, ALKPHOS, BILITOT, PROT, ALBUMIN in the last 168 hours. No results for input(s): LIPASE, AMYLASE in the last 168 hours. No results for input(s): AMMONIA in the last 168 hours. Coagulation Profile: No results for input(s): INR, PROTIME in the last 168 hours. Cardiac Enzymes: No results for input(s): CKTOTAL, CKMB, CKMBINDEX, TROPONINI in the last 168 hours. BNP (last 3 results) No results for input(s): PROBNP in the last 8760 hours. HbA1C: No results for input(s): HGBA1C in the last 72 hours. CBG: No results for input(s): GLUCAP in the last 168 hours. Lipid Profile: No results for input(s): CHOL, HDL, LDLCALC, TRIG, CHOLHDL, LDLDIRECT in the last 72 hours. Thyroid Function Tests: No results for input(s): TSH, T4TOTAL, FREET4, T3FREE, THYROIDAB in the last 72 hours. Anemia Panel: No results for input(s): VITAMINB12, FOLATE, FERRITIN, TIBC, IRON, RETICCTPCT in the last 72 hours. Urine analysis:    Component Value Date/Time   COLORURINE YELLOW 04/30/2021 1355   APPEARANCEUR HAZY (A) 04/30/2021 1355   APPEARANCEUR Clear 04/16/2021 1053   LABSPEC 1.018 04/30/2021 1355   PHURINE 9.0 (H) 04/30/2021 1355   GLUCOSEU NEGATIVE 04/30/2021 1355   HGBUR MODERATE (A) 04/30/2021 1355   BILIRUBINUR NEGATIVE 04/30/2021 1355   BILIRUBINUR Negative 04/16/2021 [place] 04/30/2021 1355   PROTEINUR 30 (A) 04/30/2021 1355   UROBILINOGEN 0.2 12/18/2019 1009   UROBILINOGEN 1.0 04/10/2013 2115   NITRITE NEGATIVE 04/30/2021 1355   LEUKOCYTESUR TRACE (A) 04/30/2021 1355   Sepsis Labs: @LABRCNTIP (procalcitonin:4,lacticidven:4)  ) Recent Results (from the past 240 hour(s))  Resp Panel by RT-PCR (Flu A&B, Covid) Nasopharyngeal Swab     Status: None   Collection Time: 04/28/21  3:17 PM   Specimen: Nasopharyngeal Swab;  Nasopharyngeal(NP) swabs in vial transport medium  Result Value Ref Range Status   SARS Coronavirus 2 by RT PCR NEGATIVE NEGATIVE Final    Comment: (NOTE) SARS-CoV-2 target nucleic acids are NOT DETECTED.  The SARS-CoV-2 RNA is generally detectable in upper respiratory specimens during the acute phase of infection. The lowest concentration of SARS-CoV-2 viral copies this assay can detect is 138 copies/mL. A negative result does not preclude SARS-Cov-2 infection and should not be used as the sole basis for treatment or other patient management decisions. A negative result may occur with  improper specimen collection/handling, submission of specimen other than nasopharyngeal swab, presence of viral mutation(s) within the areas targeted by this assay, and inadequate number of viral copies(<138 copies/mL). A negative result must be combined with clinical observations, patient history, and epidemiological information. The expected result is Negative.  Fact Sheet for Patients:  EntrepreneurPulse.com.au  Fact Sheet for Healthcare Providers:  IncredibleEmployment.be  This test is no t yet approved or cleared by the [place] FDA and  has been authorized for detection and/or diagnosis of SARS-CoV-2 by FDA under an Emergency Use Authorization (EUA). This EUA will remain  in effect (meaning this test  can be used) for the duration of the COVID-19 declaration under Section 564(b)(1) of the Act, 21 U.S.C.section 360bbb-3(b)(1), unless the authorization is terminated  or revoked sooner.       Influenza A by PCR NEGATIVE NEGATIVE Final   Influenza B by PCR NEGATIVE NEGATIVE Final    Comment: (NOTE) The Xpert Xpress SARS-CoV-2/FLU/RSV plus assay is intended as an aid in the diagnosis of influenza from Nasopharyngeal swab specimens and should not be used as a sole basis for treatment. Nasal washings and aspirates are unacceptable for Xpert Xpress  SARS-CoV-2/FLU/RSV testing.  Fact Sheet for Patients: EntrepreneurPulse.com.au  Fact Sheet for Healthcare Providers: IncredibleEmployment.be  This test is not yet approved or cleared by the [place] FDA and has been authorized for detection and/or diagnosis of SARS-CoV-2 by FDA under an Emergency Use Authorization (EUA). This EUA will remain in effect (meaning this test can be used) for the duration of the COVID-19 declaration under Section 564(b)(1) of the Act, 21 U.S.C. section 360bbb-3(b)(1), unless the authorization is terminated or revoked.  Performed at [facility], [address], [place], [place] [address]   Culture, blood (routine x 2)     Status: None (Preliminary result)   Collection Time: 04/28/21  3:56 PM   Specimen: BLOOD RIGHT ARM  Result Value Ref Range Status   Specimen Description BLOOD RIGHT ARM  Final   Special Requests   Final    BOTTLES DRAWN AEROBIC AND ANAEROBIC Blood Culture adequate volume   Culture   Final    NO GROWTH 3 DAYS Performed at [facility], [address], [place], [place] [address]    Report Status PENDING  Incomplete  Culture, blood (routine x 2)     Status: None (Preliminary result)   Collection Time: 04/28/21  3:56 PM   Specimen: BLOOD LEFT HAND  Result Value Ref Range Status   Specimen Description BLOOD LEFT HAND  Final   Special Requests   Final    BOTTLES DRAWN AEROBIC AND ANAEROBIC Blood Culture adequate volume   Culture   Final    NO GROWTH 3 DAYS Performed at [facility], [address] [place],  [address]    Report Status PENDING  Incomplete         Radiology Studies: CT HEAD WO CONTRAST (5MM)  Result Date: 04/30/2021 CLINICAL DATA:  Cerebral hemorrhage suspected EXAM: CT HEAD WITHOUT CONTRAST TECHNIQUE: Contiguous axial images were obtained from the base of the skull through the vertex without intravenous contrast. COMPARISON:  04/28/2021 FINDINGS: Brain: Moderate  atrophy. Moderate chronic microvascular ischemic changes. Negative for acute infarct, or mass. No acute hemorrhage. Interval resolution of small amount of residual subarachnoid hemorrhage in the right frontal lobe. Vascular: Negative for hyperdense vessel Skull: Negative Sinuses/Orbits: Mucosal edema paranasal sinuses. Retention cyst left maxillary sinus. Bilateral cataract extraction Other: None IMPRESSION: No acute abnormality Interval resolution of hyperdensity in the right frontal lobe which may be resolving subarachnoid hemorrhage. Electronically Signed   By: Franchot Gallo M.D.   On: 04/30/2021 14:12        Scheduled Meds:  atorvastatin  40 mg Oral Daily   darifenacin  7.5 mg Oral Daily   pantoprazole  40 mg Oral Daily   verapamil  180 mg Oral QHS   Continuous Infusions:  heparin 700 Units/hr (05/01/21 1056)     LOS: 3 days    Time spent: 62min  Domenic Polite, MD [facility]   05/01/2021, 11:40 AM

## 2021-05-02 DIAGNOSIS — I629 Nontraumatic intracranial hemorrhage, unspecified: Secondary | ICD-10-CM | POA: Diagnosis not present

## 2021-05-02 LAB — BASIC METABOLIC PANEL
Anion gap: 7 (ref 5–15)
BUN: 19 mg/dL (ref 8–23)
CO2: 25 mmol/L (ref 22–32)
Calcium: 11.7 mg/dL — ABNORMAL HIGH (ref 8.9–10.3)
Chloride: 107 mmol/L (ref 98–111)
Creatinine, Ser: 1.05 mg/dL — ABNORMAL HIGH (ref 0.44–1.00)
GFR, Estimated: 51 mL/min — ABNORMAL LOW (ref >60)
Glucose, Bld: 98 mg/dL (ref 70–99)
Potassium: 3.7 mmol/L (ref 3.5–5.1)
Sodium: 139 mmol/L (ref 135–145)

## 2021-05-02 LAB — CBC
HCT: 35.4 % — ABNORMAL LOW (ref 36.0–46.0)
Hemoglobin: 11.4 g/dL — ABNORMAL LOW (ref 12.0–15.0)
MCH: 30.5 pg (ref 26.0–34.0)
MCHC: 32.2 g/dL (ref 30.0–36.0)
MCV: 94.7 fL (ref 80.0–100.0)
Platelets: 244 10*3/uL (ref 150–400)
RBC: 3.74 MIL/uL — ABNORMAL LOW (ref 3.87–5.11)
RDW: 14.1 % (ref 11.5–15.5)
WBC: 6.6 10*3/uL (ref 4.0–10.5)
nRBC: 0 % (ref 0.0–0.2)

## 2021-05-02 NOTE — Progress Notes (Signed)
PROGRESS NOTE    Amanda [name]  GYI:[id number] DOB: [birth year] DOA: 04/28/2021 PCP: [name], DO  Brief Narrative: 89/F with history of dementia, hypertension tension, dyslipidemia who was recently discharged to SNF from [facility] following hospitalization for traumatic intracranial hemorrhage following a fall she presented to the ED with complaints of shortness of breath, was noted to be mildly hypoxic to 88% on room air on arrival -She had a CTA chest in the ED which noted small scattered bilateral pulmonary emboli and atelectasis, CT head noted acute hemorrhage in the anterior right frontal lobe, she was subsequently transferred to [facility] for neurosurgical evaluation. -Seen by neurosurgery yesterday and started on low-dose heparin drip without bolus  Assessment & Plan:   Intracranial hemorrhage -Recent admission secondary to traumatic small SAH following a fall, unclear if she had another fall again or if it was spontaneous this time, ?  Amyloid angiopathy -Neurosurgery consulting, low started on low-dose heparin infusion for PE last evening, repeat CT head noted to be stable/improving, will transition to oral eliquis today, d/w neurosurgery, will use 5mg  BID and skip higher first week doing in this setting ( tiny PE and SAH) -Remains stable clinically -updated daughter regarding the risk of rebleeding, clinical deterioration, she confirms DNR    Pulmonary embolism -new diagnosis, CTA chest in the ED yesterday noted few scattered tiny pulmonary emboli extending filling defects. -Started on low-dose heparin drip without bolus on admission per neurosurgical recommendations, repeat CT head is stable, transition to oral anticoagulation as above   Acute hypoxic respiratory failure -Due to above, as well as evidence of atelectasis on imaging,  -encourage incentive spirometry -Out of bed as tolerated  Dementia -Stable   Hyperlipidemia -Continue with statin   GERD - Continue with PPI    Hypokalemia -Mild, repleted   Hypertension -continue with verapamil  DVT prophylaxis: SCDs Code Status: DNR Family Communication: Updated daughter 11/25, left message on 11/27 Disposition Plan: Possibly home with home health in 48 hours Status is: Inpatient  Remains inpatient appropriate because: Severity of illness  Consultants:  Neurosurgery  Procedures:   Antimicrobials:    Subjective: -Feels okay, no events overnight, denies any specific complaints  Objective: Vitals:   05/01/21 2111 05/01/21 2315 05/02/21 0310 05/02/21 0720  BP: 128/68 129/64 126/61 106/60  Pulse:  63 60 61  Resp:  14 13 16   Temp:  97.6 F (36.4 C) 97.8 F (36.6 C) 98.3 F (36.8 C)  TempSrc:  Oral Oral Axillary  SpO2:  97% 94% 93%  Weight:      Height:        Intake/Output Summary (Last 24 hours) at 05/02/2021 1102 Last data filed at 05/02/2021 0456 Gross per 24 hour  Intake --  Output 600 ml  Net -600 ml   Filed Weights   04/28/21 1507  Weight: 50.8 kg    Examination:  General exam: Pleasant elderly female laying in bed, AAO x2, no distress, moderate cognitive deficits noted CVS: S1-S2, regular rate rhythm Lungs: Clear bilaterally Abdomen: Soft, nontender, bowel sounds present Extremities: No edema  Neuro: Moves all extremities, no localizing signs, pupils are equal and reactive Skin: No rashes Psychiatry: Poor insight and judgment.   Data Reviewed:   CBC: Recent Labs  Lab 04/28/21 1519 04/29/21 0223 04/29/21 1122 04/30/21 0101 05/01/21 0224 05/02/21 0239  WBC 6.1 6.6 5.4 6.5 6.2 6.6  NEUTROABS 4.0  --   --   --   --   --   HGB 11.3* 10.7*  11.6* 11.9* 11.5* 11.4*  HCT 35.2* 32.4* 35.6* 36.2 35.6* 35.4*  MCV 98.1 95.0 95.4 94.8 94.4 94.7  PLT 218 208 215 247 249 856   Basic Metabolic Panel: Recent Labs  Lab 04/28/21 1519 04/29/21 0223 04/30/21 0101 05/01/21 0224 05/02/21 0239  NA 141 140 138 138 139  K 3.4* 3.7 4.0 3.8 3.7  CL 108 109 105 106 107   CO2 26 25 26 26 25   GLUCOSE 124* 81 87 99 98  BUN 16 12 13 15 19   CREATININE 1.01* 0.89 1.05* 1.11* 1.05*  CALCIUM 10.5* 10.6* 11.5* 11.4* 11.7*   GFR: Estimated Creatinine Clearance: 26.6 mL/min (A) (by C-G formula based on SCr of 1.05 mg/dL (H)). Liver Function Tests: No results for input(s): AST, ALT, ALKPHOS, BILITOT, PROT, ALBUMIN in the last 168 hours. No results for input(s): LIPASE, AMYLASE in the last 168 hours. No results for input(s): AMMONIA in the last 168 hours. Coagulation Profile: No results for input(s): INR, PROTIME in the last 168 hours. Cardiac Enzymes: No results for input(s): CKTOTAL, CKMB, CKMBINDEX, TROPONINI in the last 168 hours. BNP (last 3 results) No results for input(s): PROBNP in the last 8760 hours. HbA1C: No results for input(s): HGBA1C in the last 72 hours. CBG: No results for input(s): GLUCAP in the last 168 hours. Lipid Profile: No results for input(s): CHOL, HDL, LDLCALC, TRIG, CHOLHDL, LDLDIRECT in the last 72 hours. Thyroid Function Tests: No results for input(s): TSH, T4TOTAL, FREET4, T3FREE, THYROIDAB in the last 72 hours. Anemia Panel: No results for input(s): VITAMINB12, FOLATE, FERRITIN, TIBC, IRON, RETICCTPCT in the last 72 hours. Urine analysis:    Component Value Date/Time   COLORURINE YELLOW 04/30/2021 1355   APPEARANCEUR HAZY (A) 04/30/2021 1355   APPEARANCEUR Clear 04/16/2021 1053   LABSPEC 1.018 04/30/2021 1355   PHURINE 9.0 (H) 04/30/2021 1355   GLUCOSEU NEGATIVE 04/30/2021 1355   HGBUR MODERATE (A) 04/30/2021 1355   BILIRUBINUR NEGATIVE 04/30/2021 1355   BILIRUBINUR Negative 04/16/2021 [place] 04/30/2021 1355   PROTEINUR 30 (A) 04/30/2021 1355   UROBILINOGEN 0.2 12/18/2019 1009   UROBILINOGEN 1.0 04/10/2013 2115   NITRITE NEGATIVE 04/30/2021 1355   LEUKOCYTESUR TRACE (A) 04/30/2021 1355   Sepsis Labs: @LABRCNTIP (procalcitonin:4,lacticidven:4)  ) Recent Results (from the past 240 hour(s))  Resp  Panel by RT-PCR (Flu A&B, Covid) Nasopharyngeal Swab     Status: None   Collection Time: 04/28/21  3:17 PM   Specimen: Nasopharyngeal Swab; Nasopharyngeal(NP) swabs in vial transport medium  Result Value Ref Range Status   SARS Coronavirus 2 by RT PCR NEGATIVE NEGATIVE Final    Comment: (NOTE) SARS-CoV-2 target nucleic acids are NOT DETECTED.  The SARS-CoV-2 RNA is generally detectable in upper respiratory specimens during the acute phase of infection. The lowest concentration of SARS-CoV-2 viral copies this assay can detect is 138 copies/mL. A negative result does not preclude SARS-Cov-2 infection and should not be used as the sole basis for treatment or other patient management decisions. A negative result may occur with  improper specimen collection/handling, submission of specimen other than nasopharyngeal swab, presence of viral mutation(s) within the areas targeted by this assay, and inadequate number of viral copies(<138 copies/mL). A negative result must be combined with clinical observations, patient history, and epidemiological information. The expected result is Negative.  Fact Sheet for Patients:  EntrepreneurPulse.com.au  Fact Sheet for Healthcare Providers:  IncredibleEmployment.be  This test is no t yet approved or cleared by the [place] and  has been authorized for detection and/or diagnosis of SARS-CoV-2 by FDA under an Emergency Use Authorization (EUA). This EUA will remain  in effect (meaning this test can be used) for the duration of the COVID-19 declaration under Section 564(b)(1) of the Act, 21 U.S.C.section 360bbb-3(b)(1), unless the authorization is terminated  or revoked sooner.       Influenza A by PCR NEGATIVE NEGATIVE Final   Influenza B by PCR NEGATIVE NEGATIVE Final    Comment: (NOTE) The Xpert Xpress SARS-CoV-2/FLU/RSV plus assay is intended as an aid in the diagnosis of influenza from Nasopharyngeal  swab specimens and should not be used as a sole basis for treatment. Nasal washings and aspirates are unacceptable for Xpert Xpress SARS-CoV-2/FLU/RSV testing.  Fact Sheet for Patients: EntrepreneurPulse.com.au  Fact Sheet for Healthcare Providers: IncredibleEmployment.be  This test is not yet approved or cleared by the [place] FDA and has been authorized for detection and/or diagnosis of SARS-CoV-2 by FDA under an Emergency Use Authorization (EUA). This EUA will remain in effect (meaning this test can be used) for the duration of the COVID-19 declaration under Section 564(b)(1) of the Act, 21 U.S.C. section 360bbb-3(b)(1), unless the authorization is terminated or revoked.  Performed at [facility], [address], [place], [place] [address]   Culture, blood (routine x 2)     Status: None (Preliminary result)   Collection Time: 04/28/21  3:56 PM   Specimen: BLOOD RIGHT ARM  Result Value Ref Range Status   Specimen Description BLOOD RIGHT ARM  Final   Special Requests   Final    BOTTLES DRAWN AEROBIC AND ANAEROBIC Blood Culture adequate volume   Culture   Final    NO GROWTH 3 DAYS Performed at [facility], [address] [place], [place] [address]    Report Status PENDING  Incomplete  Culture, blood (routine x 2)     Status: None (Preliminary result)   Collection Time: 04/28/21  3:56 PM   Specimen: BLOOD LEFT HAND  Result Value Ref Range Status   Specimen Description BLOOD LEFT HAND  Final   Special Requests   Final    BOTTLES DRAWN AEROBIC AND ANAEROBIC Blood Culture adequate volume   Culture   Final    NO GROWTH 3 DAYS Performed at [facility], [address] [place], [place] [address]    Report Status PENDING  Incomplete         Radiology Studies: CT HEAD WO CONTRAST (5MM)  Result Date: 04/30/2021 CLINICAL DATA:  Cerebral hemorrhage suspected EXAM: CT HEAD WITHOUT CONTRAST TECHNIQUE: Contiguous axial images were  obtained from the base of the skull through the vertex without intravenous contrast. COMPARISON:  04/28/2021 FINDINGS: Brain: Moderate atrophy. Moderate chronic microvascular ischemic changes. Negative for acute infarct, or mass. No acute hemorrhage. Interval resolution of small amount of residual subarachnoid hemorrhage in the right frontal lobe. Vascular: Negative for hyperdense vessel Skull: Negative Sinuses/Orbits: Mucosal edema paranasal sinuses. Retention cyst left maxillary sinus. Bilateral cataract extraction Other: None IMPRESSION: No acute abnormality Interval resolution of hyperdensity in the right frontal lobe which may be resolving subarachnoid hemorrhage. Electronically Signed   By: Franchot Gallo M.D.   On: 04/30/2021 14:12     Scheduled Meds:  apixaban  5 mg Oral BID   atorvastatin  40 mg Oral Daily   darifenacin  7.5 mg Oral Daily   pantoprazole  40 mg Oral Daily   verapamil  180 mg Oral QHS   Continuous Infusions:     LOS:  4 days    Time spent: 76min  Domenic Polite, MD [facility]   05/02/2021, 11:02 AM

## 2021-05-03 DIAGNOSIS — I629 Nontraumatic intracranial hemorrhage, unspecified: Secondary | ICD-10-CM | POA: Diagnosis not present

## 2021-05-03 LAB — CBC
HCT: 38.6 % (ref 36.0–46.0)
Hemoglobin: 12.7 g/dL (ref 12.0–15.0)
MCH: 30.9 pg (ref 26.0–34.0)
MCHC: 32.9 g/dL (ref 30.0–36.0)
MCV: 93.9 fL (ref 80.0–100.0)
Platelets: 253 10*3/uL (ref 150–400)
RBC: 4.11 MIL/uL (ref 3.87–5.11)
RDW: 14.2 % (ref 11.5–15.5)
WBC: 9.2 10*3/uL (ref 4.0–10.5)
nRBC: 0 % (ref 0.0–0.2)

## 2021-05-03 LAB — CULTURE, BLOOD (ROUTINE X 2)
Culture: NO GROWTH
Culture: NO GROWTH
Special Requests: ADEQUATE
Special Requests: ADEQUATE

## 2021-05-03 LAB — BASIC METABOLIC PANEL
Anion gap: 7 (ref 5–15)
BUN: 21 mg/dL (ref 8–23)
CO2: 27 mmol/L (ref 22–32)
Calcium: 12.3 mg/dL — ABNORMAL HIGH (ref 8.9–10.3)
Chloride: 105 mmol/L (ref 98–111)
Creatinine, Ser: 1.2 mg/dL — ABNORMAL HIGH (ref 0.44–1.00)
GFR, Estimated: 44 mL/min — ABNORMAL LOW (ref >60)
Glucose, Bld: 103 mg/dL — ABNORMAL HIGH (ref 70–99)
Potassium: 3.9 mmol/L (ref 3.5–5.1)
Sodium: 139 mmol/L (ref 135–145)

## 2021-05-03 MED ORDER — POLYETHYLENE GLYCOL 3350 17 G PO PACK: 17.0000 g | PACK | Freq: Every day | ORAL | Status: DC | PRN

## 2021-05-03 MED ORDER — SENNOSIDES-DOCUSATE SODIUM 8.6-50 MG PO TABS
1.0000 | ORAL_TABLET | Freq: Two times a day (BID) | ORAL | Status: DC
  Administered 2021-05-03 – 2021-05-05 (×5): 1 via ORAL
  Filled 2021-05-03 (×5): qty 1

## 2021-05-03 NOTE — Progress Notes (Signed)
Physical Therapy Treatment Patient Details Name: Amanda [name] MRN: [id number] DOB: 11-Jan-1931 Today's Date: 05/03/2021   History of Present Illness 87 y/o female presented to [facility] ED on 11/23 for complaints of O2 sats in upper 80's overnight. Recently diagnosed with PNA. Admitted 10/6-10/24/22 for signifcant fall with acute bifrontal hemorrhage and small SAH and discharged to SNF. CTA chest revealed small scatter pulmonary emboli. [facility] concerning for acute hemorrhage in anterior R frontal lobe. Repeat CTH showed interval resolution of hyperdensity in R frontal lobe which may be resolving SAH. Transferred to [facility]. PMH: dementia, HTN, CVA, CAD    PT Comments    Pt resting upon arrival to room, requires mod encouragement to participate. Pt max assist for to/from EOB, and requiring mod truncal assist to maintain EOB sitting. SNF remains appropriate, pt's daughter at bedside and supportive. Will continue to follow.    Recommendations for follow up therapy are one component of a multi-disciplinary discharge planning process, led by the attending physician.  Recommendations may be updated based on patient status, additional functional criteria and insurance authorization.  Follow Up Recommendations  Skilled nursing-short term rehab (<3 hours/day)     Assistance Recommended at Discharge Frequent or constant Supervision/Assistance  Equipment Recommendations  Other (comment) (defer)    Recommendations for Other Services       Precautions / Restrictions Precautions Precautions: Fall Restrictions Weight Bearing Restrictions: No     Mobility  Bed Mobility Overal bed mobility: Needs Assistance Bed Mobility: Sit to Supine;Supine to Sit     Supine to sit: Max assist;HOB elevated Sit to supine: Max assist;HOB elevated   General bed mobility comments: assist for trunk and LE management, scooting to/from EOB.    Transfers                   General transfer comment: unable today given  level of arousal and poor sitting balance    Ambulation/Gait                   Stairs             Wheelchair Mobility    Modified Rankin (Stroke Patients Only)       Balance Overall balance assessment: Needs assistance Sitting-balance support: Feet supported Sitting balance-Leahy Scale: Poor Sitting balance - Comments: required mod A to sit EOB statically, heavy R lateral lean. PT facilitating upright sitting truncal and lateral pelvic input Postural control: Posterior lean;Right lateral lean                                  Cognition Arousal/Alertness: Lethargic Behavior During Therapy: Flat affect Overall Cognitive Status: Impaired/Different from baseline Area of Impairment: Attention;Memory;Following commands;Safety/judgement;Awareness;Problem solving                   Current Attention Level: Focused Memory: Decreased short-term memory Following Commands: Follows one step commands inconsistently;Follows one step commands with increased time   Awareness: Intellectual Problem Solving: Slow processing;Decreased initiation;Difficulty sequencing;Requires verbal cues;Requires tactile cues General Comments: pt lethargic, very inconsistently follows multimodal commands. Pt's daughter at bedside attempting to motivate pt for mobility        Exercises General Exercises - Lower Extremity Ankle Circles/Pumps: PROM;Both;10 reps;Supine Heel Slides: AAROM;Both;10 reps;Supine (cues for "push my hand" when moving from hip and knee flexion to extension) Straight Leg Raises: AAROM;Both;10 reps;Supine    General Comments General comments (skin integrity, edema, etc.): placed on 2LO2  for sats sustained in mid-80s, RN notified      Pertinent Vitals/Pain Pain Assessment: Faces Faces Pain Scale: Hurts little more Pain Location: generalized, during mobility Pain Descriptors / Indicators: Discomfort;Grimacing Pain Intervention(s): Limited activity  within patient's tolerance;Monitored during session;Repositioned    Home Living                          Prior Function            PT Goals (current goals can now be found in the care plan section) Acute Rehab PT Goals Patient Stated Goal: did not state PT Goal Formulation: Patient unable to participate in goal setting Time For Goal Achievement: 05/15/21 Potential to Achieve Goals: Fair Progress towards PT goals: Progressing toward goals    Frequency    Min 2X/week      PT Plan Current plan remains appropriate    Co-evaluation              AM-PAC PT "6 Clicks" Mobility   Outcome Measure  Help needed turning from your back to your side while in a flat bed without using bedrails?: Total Help needed moving from lying on your back to sitting on the side of a flat bed without using bedrails?: Total Help needed moving to and from a bed to a chair (including a wheelchair)?: Total   Help needed to walk in hospital room?: Total Help needed climbing 3-5 steps with a railing? : Total 6 Click Score: 5    End of Session Equipment Utilized During Treatment: Oxygen Activity Tolerance: Patient limited by lethargy Patient left: in bed;with call bell/phone within reach;with bed alarm set;with family/visitor present Nurse Communication: Mobility status;Other (comment) (O2 needs) PT Visit Diagnosis: Muscle weakness (generalized) (M62.81);Unsteadiness on feet (R26.81);Difficulty in walking, not elsewhere classified (R26.2)     Time: 6378-5885 PT Time Calculation (min) (ACUTE ONLY): 21 min  Charges:  $Therapeutic Activity: 8-22 mins                     Stacie Glaze, PT DPT Acute Rehabilitation Services Pager [phone]  Office [phone]    Roxine Caddy E Ruffin Pyo 05/03/2021, 11:36 AM

## 2021-05-03 NOTE — Progress Notes (Signed)
PROGRESS NOTE    Shakaria T Kercheval  WUJ:[id number] DOB: [birth year] DOA: 04/28/2021 PCP: [name], DO  Brief Narrative: 89/F with history of dementia, hypertension tension, dyslipidemia who was recently discharged to SNF from [facility] following hospitalization for traumatic intracranial hemorrhage following a fall she presented to the ED with complaints of shortness of breath, was noted to be mildly hypoxic to 88% on room air on arrival -She had a CTA chest in the ED which noted small scattered bilateral pulmonary emboli and atelectasis, CT head noted acute hemorrhage in the anterior right frontal lobe, she was subsequently transferred to [facility] for neurosurgical evaluation. -Seen by neurosurgery yesterday and started on low-dose heparin drip without bolus  Assessment & Plan:   Intracranial hemorrhage -Recent admission secondary to traumatic small SAH following a fall, unclear if she had another fall again or if it was spontaneous this time, ?  Amyloid angiopathy -Neurosurgery consulting, low started on low-dose heparin infusion for PE last on admission, repeat CT head noted to be stable/improving, transitioned to oral Eliquis 11/27, d/w neurosurgery, will use 5mg  BID and skip higher first week doing in this setting ( tiny PE and SAH) -Remains stable clinically -updated daughter regarding the risk of rebleeding, clinical deterioration, she confirms DNR -PT OT eval completed, SNF recommended -Discussed with daughter, will consult TOC    Pulmonary embolism -new diagnosis, CTA chest in the ED yesterday noted few scattered tiny pulmonary emboli extending filling defects. -Treated with low-dose heparin drip without bolus on admission per neurosurgical recommendations, repeat CT head is stable, transitioned to oral anticoagulation as above   Acute hypoxic respiratory failure -Due to above, as well as evidence of atelectasis on imaging,  -encourage incentive spirometry -Out of bed as  tolerated  Dementia -Stable   Hyperlipidemia -Continue with statin   GERD - Continue with PPI   Hypokalemia -Mild, repleted   Hypertension -continue with verapamil  DVT prophylaxis: Eliquis Code Status: DNR Family Communication: Updated daughter Disposition Plan: Likely SNF Status is: Inpatient  Remains inpatient appropriate because: Severity of illness  Consultants:  Neurosurgery  Procedures:   Antimicrobials:    Subjective: -Feels okay, no events overnight, oral intake is poor  Objective: Vitals:   05/02/21 2325 05/03/21 0324 05/03/21 0722 05/03/21 0900  BP: 119/64 114/74  128/64  Pulse: 76 72    Resp: 15 15    Temp: 97.7 F (36.5 C) (!) 96.4 F (35.8 C) (!) 97.3 F (36.3 C) (!) 97 F (36.1 C)  TempSrc: Axillary Axillary Oral Oral  SpO2: 94% 93%    Weight:      Height:        Intake/Output Summary (Last 24 hours) at 05/03/2021 1139 Last data filed at 05/03/2021 0900 Gross per 24 hour  Intake 120 ml  Output 500 ml  Net -380 ml   Filed Weights   04/28/21 1507  Weight: 50.8 kg    Examination:  General exam: Pleasant elderly female sitting up in bed, AAO x2, flat affect, no distress, moderate cognitive deficits CVS: S1-S2, regular rate rhythm Lungs: Clear anteriorly Abdomen: Soft, nontender, bowel sounds present Extremities: No edema  Neuro: Moves all extremities, no localizing signs, pupils are equal and reactive Skin: No rashes Psychiatry: Poor insight and judgment.   Data Reviewed:   CBC: Recent Labs  Lab 04/28/21 1519 04/29/21 0223 04/29/21 1122 04/30/21 0101 05/01/21 0224 05/02/21 0239 05/03/21 0556  WBC 6.1   < > 5.4 6.5 6.2 6.6 9.2  NEUTROABS 4.0  --   --   --   --   --   --  HGB 11.3*   < > 11.6* 11.9* 11.5* 11.4* 12.7  HCT 35.2*   < > 35.6* 36.2 35.6* 35.4* 38.6  MCV 98.1   < > 95.4 94.8 94.4 94.7 93.9  PLT 218   < > 215 247 249 244 253   < > = values in this interval not displayed.   Basic Metabolic  Panel: Recent Labs  Lab 04/29/21 0223 04/30/21 0101 05/01/21 0224 05/02/21 0239 05/03/21 0556  NA 140 138 138 139 139  K 3.7 4.0 3.8 3.7 3.9  CL 109 105 106 107 105  CO2 25 26 26 25 27   GLUCOSE 81 87 99 98 103*  BUN 12 13 15 19 21   CREATININE 0.89 1.05* 1.11* 1.05* 1.20*  CALCIUM 10.6* 11.5* 11.4* 11.7* 12.3*   GFR: Estimated Creatinine Clearance: 23.3 mL/min (A) (by C-G formula based on SCr of 1.2 mg/dL (H)). Liver Function Tests: No results for input(s): AST, ALT, ALKPHOS, BILITOT, PROT, ALBUMIN in the last 168 hours. No results for input(s): LIPASE, AMYLASE in the last 168 hours. No results for input(s): AMMONIA in the last 168 hours. Coagulation Profile: No results for input(s): INR, PROTIME in the last 168 hours. Cardiac Enzymes: No results for input(s): CKTOTAL, CKMB, CKMBINDEX, TROPONINI in the last 168 hours. BNP (last 3 results) No results for input(s): PROBNP in the last 8760 hours. HbA1C: No results for input(s): HGBA1C in the last 72 hours. CBG: No results for input(s): GLUCAP in the last 168 hours. Lipid Profile: No results for input(s): CHOL, HDL, LDLCALC, TRIG, CHOLHDL, LDLDIRECT in the last 72 hours. Thyroid Function Tests: No results for input(s): TSH, T4TOTAL, FREET4, T3FREE, THYROIDAB in the last 72 hours. Anemia Panel: No results for input(s): VITAMINB12, FOLATE, FERRITIN, TIBC, IRON, RETICCTPCT in the last 72 hours. Urine analysis:    Component Value Date/Time   COLORURINE YELLOW 04/30/2021 1355   APPEARANCEUR HAZY (A) 04/30/2021 1355   APPEARANCEUR Clear 04/16/2021 1053   LABSPEC 1.018 04/30/2021 1355   PHURINE 9.0 (H) 04/30/2021 1355   GLUCOSEU NEGATIVE 04/30/2021 1355   HGBUR MODERATE (A) 04/30/2021 1355   BILIRUBINUR NEGATIVE 04/30/2021 1355   BILIRUBINUR Negative 04/16/2021 [place] 04/30/2021 1355   PROTEINUR 30 (A) 04/30/2021 1355   UROBILINOGEN 0.2 12/18/2019 1009   UROBILINOGEN 1.0 04/10/2013 2115   NITRITE NEGATIVE  04/30/2021 1355   LEUKOCYTESUR TRACE (A) 04/30/2021 1355   Sepsis Labs: @LABRCNTIP (procalcitonin:4,lacticidven:4)  ) Recent Results (from the past 240 hour(s))  Resp Panel by RT-PCR (Flu A&B, Covid) Nasopharyngeal Swab     Status: None   Collection Time: 04/28/21  3:17 PM   Specimen: Nasopharyngeal Swab; Nasopharyngeal(NP) swabs in vial transport medium  Result Value Ref Range Status   SARS Coronavirus 2 by RT PCR NEGATIVE NEGATIVE Final    Comment: (NOTE) SARS-CoV-2 target nucleic acids are NOT DETECTED.  The SARS-CoV-2 RNA is generally detectable in upper respiratory specimens during the acute phase of infection. The lowest concentration of SARS-CoV-2 viral copies this assay can detect is 138 copies/mL. A negative result does not preclude SARS-Cov-2 infection and should not be used as the sole basis for treatment or other patient management decisions. A negative result may occur with  improper specimen collection/handling, submission of specimen other than nasopharyngeal swab, presence of viral mutation(s) within the areas targeted by this assay, and inadequate number of viral copies(<138 copies/mL). A negative result must be combined with clinical observations, patient history, and epidemiological information. The expected result is Negative.  Fact  Sheet for Patients:  EntrepreneurPulse.com.au  Fact Sheet for Healthcare Providers:  IncredibleEmployment.be  This test is no t yet approved or cleared by the [place] FDA and  has been authorized for detection and/or diagnosis of SARS-CoV-2 by FDA under an Emergency Use Authorization (EUA). This EUA will remain  in effect (meaning this test can be used) for the duration of the COVID-19 declaration under Section 564(b)(1) of the Act, 21 U.S.C.section 360bbb-3(b)(1), unless the authorization is terminated  or revoked sooner.       Influenza A by PCR NEGATIVE NEGATIVE Final   Influenza  B by PCR NEGATIVE NEGATIVE Final    Comment: (NOTE) The Xpert Xpress SARS-CoV-2/FLU/RSV plus assay is intended as an aid in the diagnosis of influenza from Nasopharyngeal swab specimens and should not be used as a sole basis for treatment. Nasal washings and aspirates are unacceptable for Xpert Xpress SARS-CoV-2/FLU/RSV testing.  Fact Sheet for Patients: EntrepreneurPulse.com.au  Fact Sheet for Healthcare Providers: IncredibleEmployment.be  This test is not yet approved or cleared by the [place] FDA and has been authorized for detection and/or diagnosis of SARS-CoV-2 by FDA under an Emergency Use Authorization (EUA). This EUA will remain in effect (meaning this test can be used) for the duration of the COVID-19 declaration under Section 564(b)(1) of the Act, 21 U.S.C. section 360bbb-3(b)(1), unless the authorization is terminated or revoked.  Performed at [facility], [address] [place], [place] [address]   Culture, blood (routine x 2)     Status: None   Collection Time: 04/28/21  3:56 PM   Specimen: BLOOD RIGHT ARM  Result Value Ref Range Status   Specimen Description BLOOD RIGHT ARM  Final   Special Requests   Final    BOTTLES DRAWN AEROBIC AND ANAEROBIC Blood Culture adequate volume   Culture   Final    NO GROWTH 5 DAYS Performed at [facility], [address] [place], [place] [address]    Report Status 05/03/2021 FINAL  Final  Culture, blood (routine x 2)     Status: None   Collection Time: 04/28/21  3:56 PM   Specimen: BLOOD LEFT HAND  Result Value Ref Range Status   Specimen Description BLOOD LEFT HAND  Final   Special Requests   Final    BOTTLES DRAWN AEROBIC AND ANAEROBIC Blood Culture adequate volume   Culture   Final    NO GROWTH 5 DAYS Performed at [facility], [address], [place], [place] [address]    Report Status 05/03/2021 FINAL  Final    Radiology Studies: No results found.   Scheduled Meds:   apixaban  5 mg Oral BID   atorvastatin  40 mg Oral Daily   darifenacin  7.5 mg Oral Daily   pantoprazole  40 mg Oral Daily   senna-docusate  1 tablet Oral BID   verapamil  180 mg Oral QHS   Continuous Infusions:     LOS: 5 days    Time spent: 93min  Domenic Polite, MD [facility]   05/03/2021, 11:39 AM

## 2021-05-03 NOTE — TOC Initial Note (Signed)
Transition of Care [facility]) - Initial/Assessment Note    Patient Details  Name: Amanda [name] MRN: [id number] Date of Birth: Dec 05, 1930  Transition of [facility]) CM/SW Contact:    Vinie Sill, LCSW Phone Number: 05/03/2021, 12:47 PM  Clinical Narrative:                  CSW met with patient and her daughter,Amanda [name]. She confirmed patient was expected to discharge form [facility] SNF on the day of admission at hospital. She informed CSW she would like for the patient to return to [facility] before discharging home. CSW explained she may be in copay status- she states understanding and is willing to do financially what is needed.  Melissa at [facility] confirmed patient can return - she will start insurance authorization today.  CSW will continue to follow and assist with discharge planning.  Thurmond Butts, MSW, LCSW Clinical Social Worker    Expected Discharge Plan: Skilled Nursing Facility Barriers to Discharge: Ship broker, Continued Medical Work up   Patient Goals and CMS Choice        Expected Discharge Plan and Services Expected Discharge Plan: [place] In-house Referral: Clinical Social Work     Living arrangements for the past 2 months: [place]                                      Prior Living Arrangements/Services Living arrangements for the past 2 months: [place] Lives with:: Self Patient language and need for interpreter reviewed:: No        Need for Family Participation in Patient Care: Yes (Comment) Care giver support system in place?: Yes (comment)   Criminal Activity/Legal Involvement Pertinent to Current Situation/Hospitalization: No - Comment as needed  Activities of Daily Living      Permission Sought/Granted Permission sought to share information with : Family Supports Permission granted to share information with : Yes, Verbal Permission Granted  Share  Information with NAME: Amanda [name]  Permission granted to share info w AGENCY: SNF  Permission granted to share info w Relationship: daughter  Permission granted to share info w Contact Information: [phone]  Emotional Assessment Appearance:: Appears stated age Attitude/Demeanor/Rapport:  (relaxed, resting in bed) Affect (typically observed): Quiet Orientation: : Oriented to Self Alcohol / Substance Use: Not Applicable Psych Involvement: No (comment)  Admission diagnosis:  Intracranial hemorrhage ([place]) [I62.9] Multiple subsegmental pulmonary emboli without acute cor pulmonale (HCC) [I26.94] Intraparenchymal hematoma of brain, right, with unknown loss of consciousness status, initial encounter [W54.62VO] Patient Active Problem List   Diagnosis Date Noted   Intracranial hemorrhage ([place]) 04/28/2021   Subarachnoid hemorrhage ([place]) 03/12/2021   Subarachnoid hematoma 03/11/2021   Cerebral atrophy ([place]) 01/12/2021   AMS (altered mental status) 12/31/2020   Dysphagia 07/28/2020   Abdominal pain 07/28/2020   Recurrent UTI 03/19/2020   Leg pain, posterior, right 10/08/2019   OAB (overactive bladder) 09/18/2019   Chronic cystitis with hematuria 06/19/2019   Hematochezia 02/13/2019   Melena 02/13/2019   Anemia 02/13/2019   Constipation 02/13/2019   Absolute anemia 01/31/2019   Disorder of phosphorus metabolism 01/31/2019   CKD (chronic kidney disease) stage 3, GFR 30-59 ml/min (HCC) 07/20/2018   Postural dizziness 04/23/2018   Osteoporosis 01/15/2018   Transient cerebral ischemia 04/10/2013   Hyperlipidemia 09/22/2008   Essential hypertension 09/22/2008   Coronary atherosclerosis 09/22/2008   History of CVA (cerebrovascular  accident) 09/22/2008   GERD 09/22/2008   Diverticulosis of colon 09/22/2008   Cerebral infarction due to unspecified occlusion or stenosis of unspecified cerebral artery ([place]) 09/22/2008   PCP:  [name], DO Pharmacy:   [facility], [place] [address] Phone: [phone] Fax: [phone]  [place], [place] [address] [place] [address] Phone: [phone] Fax: [phone]  [facility], [place] to Registered [address] [place] [address] Phone: [phone] Fax: [place], [place] [address] Phone: [phone] Fax: [phone]  [facility] [address] [place] [address] Phone: [phone] Fax: [phone]     Social Determinants of Health (SDOH) Interventions    Readmission Risk Interventions No flowsheet data found.

## 2021-05-03 NOTE — Care Management Important Message (Signed)
Important Message  Patient Details  Name: Amanda [name] MRN: [id number] Date of Birth: August 20, 1930   Medicare Important Message Given:  Yes     Camira Geidel 05/03/2021, 2:27 PM

## 2021-05-03 NOTE — NC FL2 (Signed)
[place] LEVEL OF CARE SCREENING TOOL     IDENTIFICATION  Patient Name: Amanda [name] City: Sep 27, 1930 Sex: female Admission Date (Current Location): 04/28/2021  [facility] and [place] Number:  Herbalist and Address:  [facility], [place] [address] [place], [place] [address]      Provider Number: [id number]  Attending Physician Name and Address:  Domenic Polite, MD  Relative Name and Phone Number:       Current Level of Care: Hospital Recommended Level of Care: [place] Prior Approval Number:    Date Approved/Denied:   PASRR Number: [id number]  Discharge Plan: SNF    Current Diagnoses: Patient Active Problem List   Diagnosis Date Noted   Intracranial hemorrhage ([place]) 04/28/2021   Subarachnoid hemorrhage ([place]) 03/12/2021   Subarachnoid hematoma 03/11/2021   Cerebral atrophy ([place]) 01/12/2021   AMS (altered mental status) 12/31/2020   Dysphagia 07/28/2020   Abdominal pain 07/28/2020   Recurrent UTI 03/19/2020   Leg pain, posterior, right 10/08/2019   OAB (overactive bladder) 09/18/2019   Chronic cystitis with hematuria 06/19/2019   Hematochezia 02/13/2019   Melena 02/13/2019   Anemia 02/13/2019   Constipation 02/13/2019   Absolute anemia 01/31/2019   Disorder of phosphorus metabolism 01/31/2019   CKD (chronic kidney disease) stage 3, GFR 30-59 ml/min ([place]) 07/20/2018   Postural dizziness 04/23/2018   Osteoporosis 01/15/2018   Transient cerebral ischemia 04/10/2013   Hyperlipidemia 09/22/2008   Essential hypertension 09/22/2008   Coronary atherosclerosis 09/22/2008   History of CVA (cerebrovascular accident) 09/22/2008   GERD 09/22/2008   Diverticulosis of colon 09/22/2008   Cerebral infarction due to unspecified occlusion or stenosis of unspecified cerebral artery ([place]) 09/22/2008    Orientation RESPIRATION BLADDER Height & Weight     Self  Normal External catheter, Incontinent Weight: 112  lb (50.8 kg) Height:  5' (152.4 cm)  BEHAVIORAL SYMPTOMS/MOOD NEUROLOGICAL BOWEL NUTRITION STATUS      Continent Diet (please see discharge summary)  AMBULATORY STATUS COMMUNICATION OF NEEDS Skin   Extensive Assist Verbally                         Personal Care Assistance Level of Assistance  Bathing, Feeding, Dressing Bathing Assistance: Maximum assistance Feeding assistance: Limited assistance Dressing Assistance: Maximum assistance     Functional Limitations Info  Sight, Hearing, Speech Sight Info: Adequate Hearing Info: Adequate Speech Info: Adequate    SPECIAL CARE FACTORS FREQUENCY  PT (By licensed PT), OT (By licensed OT)     PT Frequency: 5x per week OT Frequency: 5x per week            Contractures Contractures Info: Not present    Additional Factors Info  Code Status, Allergies Code Status Info: DNR Allergies Info: Livalo,Simvastatin,Lisinopril           Current Medications (05/03/2021):  This is the current hospital active medication list Current Facility-Administered Medications  Medication Dose Route Frequency Provider Last Rate Last Admin   apixaban (ELIQUIS) tablet 5 mg  5 mg Oral BID Domenic Polite, MD   5 mg at 05/03/21 0926   atorvastatin (LIPITOR) tablet 40 mg  40 mg Oral Daily Elgergawy, Silver Huguenin, MD   40 mg at 05/03/21 0925   darifenacin (ENABLEX) 24 hr tablet 7.5 mg  7.5 mg Oral Daily Elgergawy, Silver Huguenin, MD   7.5 mg at 05/03/21 0926   guaiFENesin-dextromethorphan (ROBITUSSIN DM) 100-10 MG/5ML syrup 5 mL  5 mL Oral Q4H PRN Elgergawy, Silver Huguenin, MD   5 mL at 04/30/21 0056   pantoprazole (PROTONIX) EC tablet 40 mg  40 mg Oral Daily Elgergawy, Silver Huguenin, MD   40 mg at 05/03/21 0925   polyethylene glycol (MIRALAX / GLYCOLAX) packet 17 g  17 g Oral Daily PRN Domenic Polite, MD       senna-docusate (Senokot-S) tablet 1 tablet  1 tablet Oral BID Domenic Polite, MD   1 tablet at 05/03/21 1029   verapamil (CALAN-SR) CR tablet 180 mg  180 mg Oral  QHS Elgergawy, Silver Huguenin, MD   180 mg at 05/02/21 2122     Discharge Medications: Please see discharge summary for a list of discharge medications.  Relevant Imaging Results:  Relevant Lab Results:   Additional Information SSN [id number]   Moderna COVID-19 Vaccine 05/26/2020 , 09/17/2019 , 08/20/2019  Vinie Sill, LCSW

## 2021-05-04 ENCOUNTER — Inpatient Hospital Stay (HOSPITAL_COMMUNITY): Payer: Medicare HMO

## 2021-05-04 DIAGNOSIS — I629 Nontraumatic intracranial hemorrhage, unspecified: Secondary | ICD-10-CM | POA: Diagnosis not present

## 2021-05-04 LAB — BASIC METABOLIC PANEL
Anion gap: 8 (ref 5–15)
BUN: 28 mg/dL — ABNORMAL HIGH (ref 8–23)
CO2: 25 mmol/L (ref 22–32)
Calcium: 12.3 mg/dL — ABNORMAL HIGH (ref 8.9–10.3)
Chloride: 106 mmol/L (ref 98–111)
Creatinine, Ser: 1.54 mg/dL — ABNORMAL HIGH (ref 0.44–1.00)
GFR, Estimated: 32 mL/min — ABNORMAL LOW (ref >60)
Glucose, Bld: 118 mg/dL — ABNORMAL HIGH (ref 70–99)
Potassium: 4.2 mmol/L (ref 3.5–5.1)
Sodium: 139 mmol/L (ref 135–145)

## 2021-05-04 LAB — URINALYSIS, ROUTINE W REFLEX MICROSCOPIC
Bilirubin Urine: NEGATIVE
Glucose, UA: NEGATIVE mg/dL
Ketones, ur: 5 mg/dL — AB
Nitrite: NEGATIVE
Protein, ur: 100 mg/dL — AB
RBC / HPF: >50 RBC/hpf — ABNORMAL HIGH (ref 0–5)
Specific Gravity, Urine: 1.014 (ref 1.005–1.030)
pH: 9 — ABNORMAL HIGH (ref 5.0–8.0)

## 2021-05-04 LAB — CBC
HCT: 41.5 % (ref 36.0–46.0)
Hemoglobin: 13.5 g/dL (ref 12.0–15.0)
MCH: 30.5 pg (ref 26.0–34.0)
MCHC: 32.5 g/dL (ref 30.0–36.0)
MCV: 93.7 fL (ref 80.0–100.0)
Platelets: 278 10*3/uL (ref 150–400)
RBC: 4.43 MIL/uL (ref 3.87–5.11)
RDW: 14.3 % (ref 11.5–15.5)
WBC: 13.2 10*3/uL — ABNORMAL HIGH (ref 4.0–10.5)
nRBC: 0 % (ref 0.0–0.2)

## 2021-05-04 MED ORDER — SODIUM CHLORIDE 0.9 % IV SOLN: INTRAVENOUS | Status: DC

## 2021-05-04 MED ORDER — SODIUM CHLORIDE 0.9 % IV SOLN
1.0000 g | INTRAVENOUS | Status: DC
  Administered 2021-05-04 – 2021-05-06 (×3): 1 g via INTRAVENOUS
  Filled 2021-05-04 (×4): qty 10

## 2021-05-04 NOTE — Progress Notes (Signed)
PROGRESS NOTE    Amanda [name]  AQT:[id number] DOB: [birth year] DOA: 04/28/2021 PCP: [name], DO  Brief Narrative: 89/F with history of dementia, hypertension tension, dyslipidemia who was recently discharged to SNF from [facility] following hospitalization for traumatic intracranial hemorrhage following a fall she presented to the ED with complaints of shortness of breath, was noted to be mildly hypoxic to 88% on room air on arrival -She had a CTA chest in the ED which noted small scattered bilateral pulmonary emboli and atelectasis, CT head noted acute hemorrhage in the anterior right frontal lobe, she was subsequently transferred to [facility] for neurosurgical evaluation. -Seen by neurosurgery yesterday and started on low-dose heparin drip without bolus  Assessment & Plan:   Intracranial hemorrhage -Recent admission secondary to traumatic small SAH following a fall, unclear if she had another fall again or if it was spontaneous this time, ?  Amyloid angiopathy -Neurosurgery consulting, low started on low-dose heparin infusion for PE last on admission, repeat CT head noted to be stable/improving, transitioned to oral Eliquis 11/27, d/w neurosurgery, will use 5mg  BID and skip higher first week doing in this setting ( tiny PE and SAH) -More lethargic today, plan to repeat CT head -Plan for SNF, short-term rehab when stable  Encephalopathy -More drowsy, poorly responsive and interactive today, minimal oral intake -Had some vague urinary symptoms 2 days ago, UA then was unremarkable will repeat UA and urine culture -Repeat CT brain rule out worsening ICH -Check CBC    Pulmonary embolism -new diagnosis, CTA chest in the ED yesterday noted few scattered tiny pulmonary emboli extending filling defects. -Treated with low-dose heparin drip without bolus on admission per neurosurgical recommendations, repeat CT head was stable, transitioned to oral anticoagulation as above   Acute hypoxic respiratory  failure -Due to above, as well as evidence of atelectasis on imaging,  -encourage incentive spirometry -Out of bed as tolerated  Dementia -Stable   Hyperlipidemia -Continue with statin   GERD - Continue with PPI   Hypokalemia -Mild, repleted   Hypertension -continue with verapamil  DVT prophylaxis: Eliquis Code Status: DNR Family Communication: No family at the bedside, updated daughter yesterday Disposition Plan: Likely SNF Status is: Inpatient  Remains inpatient appropriate because: Severity of illness  Consultants:  Neurosurgery  Procedures:   Antimicrobials:    Subjective: -Feels okay, no events overnight, oral intake is poor  Objective: Vitals:   05/04/21 0323 05/04/21 0709 05/04/21 0756 05/04/21 1300  BP: 137/75   117/79  Pulse: 91 76    Resp: 17 14    Temp: 98.5 F (36.9 C)  (!) 97.3 F (36.3 C) 97.6 F (36.4 C)  TempSrc: Oral  Oral Oral  SpO2: 95% 96%    Weight:      Height:        Intake/Output Summary (Last 24 hours) at 05/04/2021 1342 Last data filed at 05/04/2021 1300 Gross per 24 hour  Intake 840 ml  Output 250 ml  Net 590 ml   Filed Weights   04/28/21 1507  Weight: 50.8 kg    Examination:  General exam: Elderly female laying in bed, somnolent but arousable, appears more lethargic today CVS: S1-S2, regular rate rhythm Lungs: Clear anteriorly Abdomen: Soft, nontender, bowel sounds present Extremities: No edema  Neuro: Sluggish and lethargic, moves all extremities, no localizing signs, pupils are equal and reactive Skin: No rashes Psychiatry: Poor insight and judgment.   Data Reviewed:   CBC: Recent Labs  Lab 04/28/21 1519 04/29/21 0223 04/30/21  0101 05/01/21 0224 05/02/21 0239 05/03/21 0556 05/04/21 1258  WBC 6.1   < > 6.5 6.2 6.6 9.2 13.2*  NEUTROABS 4.0  --   --   --   --   --   --   HGB 11.3*   < > 11.9* 11.5* 11.4* 12.7 13.5  HCT 35.2*   < > 36.2 35.6* 35.4* 38.6 41.5  MCV 98.1   < > 94.8 94.4 94.7 93.9  93.7  PLT 218   < > 247 249 244 253 278   < > = values in this interval not displayed.   Basic Metabolic Panel: Recent Labs  Lab 04/29/21 0223 04/30/21 0101 05/01/21 0224 05/02/21 0239 05/03/21 0556  NA 140 138 138 139 139  K 3.7 4.0 3.8 3.7 3.9  CL 109 105 106 107 105  CO2 25 26 26 25 27   GLUCOSE 81 87 99 98 103*  BUN 12 13 15 19 21   CREATININE 0.89 1.05* 1.11* 1.05* 1.20*  CALCIUM 10.6* 11.5* 11.4* 11.7* 12.3*   GFR: Estimated Creatinine Clearance: 23.3 mL/min (A) (by C-G formula based on SCr of 1.2 mg/dL (H)). Liver Function Tests: No results for input(s): AST, ALT, ALKPHOS, BILITOT, PROT, ALBUMIN in the last 168 hours. No results for input(s): LIPASE, AMYLASE in the last 168 hours. No results for input(s): AMMONIA in the last 168 hours. Coagulation Profile: No results for input(s): INR, PROTIME in the last 168 hours. Cardiac Enzymes: No results for input(s): CKTOTAL, CKMB, CKMBINDEX, TROPONINI in the last 168 hours. BNP (last 3 results) No results for input(s): PROBNP in the last 8760 hours. HbA1C: No results for input(s): HGBA1C in the last 72 hours. CBG: No results for input(s): GLUCAP in the last 168 hours. Lipid Profile: No results for input(s): CHOL, HDL, LDLCALC, TRIG, CHOLHDL, LDLDIRECT in the last 72 hours. Thyroid Function Tests: No results for input(s): TSH, T4TOTAL, FREET4, T3FREE, THYROIDAB in the last 72 hours. Anemia Panel: No results for input(s): VITAMINB12, FOLATE, FERRITIN, TIBC, IRON, RETICCTPCT in the last 72 hours. Urine analysis:    Component Value Date/Time   COLORURINE AMBER (A) 05/04/2021 0949   APPEARANCEUR CLOUDY (A) 05/04/2021 0949   APPEARANCEUR Clear 04/16/2021 1053   LABSPEC 1.014 05/04/2021 0949   PHURINE 9.0 (H) 05/04/2021 0949   GLUCOSEU NEGATIVE 05/04/2021 0949   HGBUR SMALL (A) 05/04/2021 0949   BILIRUBINUR NEGATIVE 05/04/2021 0949   BILIRUBINUR Negative 04/16/2021 1053   KETONESUR 5 (A) 05/04/2021 0949   PROTEINUR 100  (A) 05/04/2021 0949   UROBILINOGEN 0.2 12/18/2019 1009   UROBILINOGEN 1.0 04/10/2013 2115   NITRITE NEGATIVE 05/04/2021 0949   LEUKOCYTESUR LARGE (A) 05/04/2021 0949   Sepsis Labs: @LABRCNTIP (procalcitonin:4,lacticidven:4)  ) Recent Results (from the past 240 hour(s))  Resp Panel by RT-PCR (Flu A&B, Covid) Nasopharyngeal Swab     Status: None   Collection Time: 04/28/21  3:17 PM   Specimen: Nasopharyngeal Swab; Nasopharyngeal(NP) swabs in vial transport medium  Result Value Ref Range Status   SARS Coronavirus 2 by RT PCR NEGATIVE NEGATIVE Final    Comment: (NOTE) SARS-CoV-2 target nucleic acids are NOT DETECTED.  The SARS-CoV-2 RNA is generally detectable in upper respiratory specimens during the acute phase of infection. The lowest concentration of SARS-CoV-2 viral copies this assay can detect is 138 copies/mL. A negative result does not preclude SARS-Cov-2 infection and should not be used as the sole basis for treatment or other patient management decisions. A negative result may occur with  improper specimen collection/handling, submission  of specimen other than nasopharyngeal swab, presence of viral mutation(s) within the areas targeted by this assay, and inadequate number of viral copies(<138 copies/mL). A negative result must be combined with clinical observations, patient history, and epidemiological information. The expected result is Negative.  Fact Sheet for Patients:  EntrepreneurPulse.com.au  Fact Sheet for Healthcare Providers:  IncredibleEmployment.be  This test is no t yet approved or cleared by the [place] FDA and  has been authorized for detection and/or diagnosis of SARS-CoV-2 by FDA under an Emergency Use Authorization (EUA). This EUA will remain  in effect (meaning this test can be used) for the duration of the COVID-19 declaration under Section 564(b)(1) of the Act, 21 U.S.C.section 360bbb-3(b)(1), unless the  authorization is terminated  or revoked sooner.       Influenza A by PCR NEGATIVE NEGATIVE Final   Influenza B by PCR NEGATIVE NEGATIVE Final    Comment: (NOTE) The Xpert Xpress SARS-CoV-2/FLU/RSV plus assay is intended as an aid in the diagnosis of influenza from Nasopharyngeal swab specimens and should not be used as a sole basis for treatment. Nasal washings and aspirates are unacceptable for Xpert Xpress SARS-CoV-2/FLU/RSV testing.  Fact Sheet for Patients: EntrepreneurPulse.com.au  Fact Sheet for Healthcare Providers: IncredibleEmployment.be  This test is not yet approved or cleared by the [place] FDA and has been authorized for detection and/or diagnosis of SARS-CoV-2 by FDA under an Emergency Use Authorization (EUA). This EUA will remain in effect (meaning this test can be used) for the duration of the COVID-19 declaration under Section 564(b)(1) of the Act, 21 U.S.C. section 360bbb-3(b)(1), unless the authorization is terminated or revoked.  Performed at [facility], [address] [place], [place] [address]   Culture, blood (routine x 2)     Status: None   Collection Time: 04/28/21  3:56 PM   Specimen: BLOOD RIGHT ARM  Result Value Ref Range Status   Specimen Description BLOOD RIGHT ARM  Final   Special Requests   Final    BOTTLES DRAWN AEROBIC AND ANAEROBIC Blood Culture adequate volume   Culture   Final    NO GROWTH 5 DAYS Performed at [facility], [address] [place], [place] [address]    Report Status 05/03/2021 FINAL  Final  Culture, blood (routine x 2)     Status: None   Collection Time: 04/28/21  3:56 PM   Specimen: BLOOD LEFT HAND  Result Value Ref Range Status   Specimen Description BLOOD LEFT HAND  Final   Special Requests   Final    BOTTLES DRAWN AEROBIC AND ANAEROBIC Blood Culture adequate volume   Culture   Final    NO GROWTH 5 DAYS Performed at [facility], [address], [place], [place]  [address]    Report Status 05/03/2021 FINAL  Final    Radiology Studies: No results found.   Scheduled Meds:  apixaban  5 mg Oral BID   atorvastatin  40 mg Oral Daily   darifenacin  7.5 mg Oral Daily   pantoprazole  40 mg Oral Daily   senna-docusate  1 tablet Oral BID   verapamil  180 mg Oral QHS   Continuous Infusions:   LOS: 6 days    Time spent: 46min  Domenic Polite, MD [facility]   05/04/2021, 1:42 PM

## 2021-05-04 NOTE — Progress Notes (Addendum)
Pt noted to be increasingly lethargic spoke with MD order placed for repeat CT head and urinalysis. Pt daughter made aware.   Renee Ramus, RN

## 2021-05-04 NOTE — TOC Progression Note (Signed)
Transition of Care [facility]) - Progression Note    Patient Details  Name: Amanda [name] MRN: [id number] Date of Birth: 1930/07/14  Transition of Care [facility]) CM/SW [place], [place] Phone Number: 05/04/2021, 3:33 PM  Clinical Narrative:     Received phone call from [place] has approved rehab.  CSW will continue to follow and assist with discharge planning.  Thurmond Butts, MSW, LCSW Clinical Social Worker    Expected Discharge Plan: Skilled Nursing Facility Barriers to Discharge: Ship broker, Continued Medical Work up  Expected Discharge Plan and Services Expected Discharge Plan: [place] In-house Referral: Clinical Social Work     Living arrangements for the past 2 months: [place]                                       Social Determinants of Health (SDOH) Interventions    Readmission Risk Interventions No flowsheet data found.

## 2021-05-05 ENCOUNTER — Inpatient Hospital Stay (HOSPITAL_COMMUNITY): Payer: Medicare HMO

## 2021-05-05 DIAGNOSIS — I629 Nontraumatic intracranial hemorrhage, unspecified: Secondary | ICD-10-CM | POA: Diagnosis not present

## 2021-05-05 LAB — CBC
HCT: 39.5 % (ref 36.0–46.0)
Hemoglobin: 13.1 g/dL (ref 12.0–15.0)
MCH: 31 pg (ref 26.0–34.0)
MCHC: 33.2 g/dL (ref 30.0–36.0)
MCV: 93.6 fL (ref 80.0–100.0)
Platelets: 247 10*3/uL (ref 150–400)
RBC: 4.22 MIL/uL (ref 3.87–5.11)
RDW: 14.6 % (ref 11.5–15.5)
WBC: 14.5 10*3/uL — ABNORMAL HIGH (ref 4.0–10.5)
nRBC: 0 % (ref 0.0–0.2)

## 2021-05-05 LAB — URINE CULTURE

## 2021-05-05 LAB — BASIC METABOLIC PANEL
Anion gap: 7 (ref 5–15)
BUN: 29 mg/dL — ABNORMAL HIGH (ref 8–23)
CO2: 22 mmol/L (ref 22–32)
Calcium: 11.4 mg/dL — ABNORMAL HIGH (ref 8.9–10.3)
Chloride: 107 mmol/L (ref 98–111)
Creatinine, Ser: 1.4 mg/dL — ABNORMAL HIGH (ref 0.44–1.00)
GFR, Estimated: 36 mL/min — ABNORMAL LOW (ref >60)
Glucose, Bld: 109 mg/dL — ABNORMAL HIGH (ref 70–99)
Potassium: 4.1 mmol/L (ref 3.5–5.1)
Sodium: 136 mmol/L (ref 135–145)

## 2021-05-05 MED ORDER — SODIUM CHLORIDE 0.9 % IV SOLN: INTRAVENOUS | Status: AC

## 2021-05-05 MED ORDER — ONDANSETRON HCL 4 MG/2ML IJ SOLN: 4.0000 mg | Freq: Four times a day (QID) | INTRAMUSCULAR | Status: DC | PRN

## 2021-05-05 NOTE — Progress Notes (Addendum)
PROGRESS NOTE    Amanda [name]  UVO:[id number] DOB: Jul 15, 1930 DOA: 04/28/2021 PCP: [name], DO  Brief Narrative: 89/F with history of dementia, hypertension tension, dyslipidemia who was recently discharged to SNF from [facility] following hospitalization for traumatic intracranial hemorrhage following a fall she presented to [facility] ED with complaints of shortness of breath, was noted to be mildly hypoxic to 88% on room air on arrival -She had a CTA chest in the ED which noted small scattered bilateral pulmonary emboli and CT head noted acute hemorrhage in the anterior right frontal lobe, she was subsequently transferred to [facility] for neurosurgical evaluation. -Seen by neurosurgery  and started on low-dose heparin drip without bolus -Remained stable on IV heparin, repeat imaging was stable/unremarkable, subsequently transitioned to oral Eliquis -11/29 was noted to be more lethargic, with worsening leukocytosis, urine was cloudy with some pelvic discomfort, started on IV ceftriaxone  Assessment & Plan:   Intracranial hemorrhage -Recent admission secondary to traumatic small SAH following a fall, unclear if she had another fall again or if it was spontaneous this time, ?  Amyloid angiopathy -Neurosurgery consulting, low started on low-dose heparin infusion for PE last on admission, repeat CT head noted to be stable/improving, transitioned to oral Eliquis 11/27, d/w neurosurgery, will use 5mg  BID and skip higher first week doing in this setting ( tiny PE and SAH) -More lethargic today, plan to repeat CT head -Plan for SNF, short-term rehab when stable  Acute toxic Encephalopathy Hematuria,?  UTI Scant bloody vaginal drainage -More drowsy, poorly responsive and interactive since 11/29 am with minimal oral intake -Repeat UA with hematuria, cloudy urine, staff noted some pelvic discomfort with urination, started IV ceftriaxone 11/29, follow-up urine cultures -Early this a.m. noted by RN to have  bloody vaginal drainage, exam noted scant dark bloody discharge -Will check pelvic ultrasound -Repeat CT head was negative    Pulmonary embolism -new diagnosis, CTA chest on admission noted few scattered tiny pulmonary emboli extending filling defects. -Treated with low-dose heparin drip without bolus on admission per neurosurgical recommendations, repeat CT head was stable, transitioned to oral anticoagulation as above   Acute hypoxic respiratory failure -Due to above, as well as evidence of atelectasis on imaging,  -encourage incentive spirometry -Out of bed as tolerated  Dementia -Stable   Hyperlipidemia -Continue with statin   GERD - Continue with PPI   Hypokalemia -Mild, repleted   Hypertension -continue with verapamil  DVT prophylaxis: Eliquis Code Status: DNR Family Communication: No family at the bedside, called and updated daughter Clarise Cruz Disposition Plan: SNF once stable, possibly 48 hours Status is: Inpatient  Remains inpatient appropriate because: Severity of illness  Consultants:  Neurosurgery  Procedures:   Antimicrobials:    Subjective: -More awake today, oral intake starting to improve Objective: Vitals:   05/04/21 2001 05/04/21 2309 05/05/21 0309 05/05/21 0710  BP: (!) 150/91 (!) 148/89 (!) 154/85 123/68  Pulse: 96 (!) 103 92 80  Resp: (!) 21 18 17 14   Temp: 98.6 F (37 C) 98.6 F (37 C) 98.4 F (36.9 C) 98.4 F (36.9 C)  TempSrc: Oral Oral Oral Oral  SpO2: 96% 96% 95% 95%  Weight:      Height:        Intake/Output Summary (Last 24 hours) at 05/05/2021 1035 Last data filed at 05/05/2021 0320 Gross per 24 hour  Intake 1434.38 ml  Output 2 ml  Net 1432.38 ml   Filed Weights   04/28/21 1507  Weight: 50.8 kg  Examination:  General exam: Elderly chronically ill female, laying in bed, awake, more alert today, oriented to self and place CVS: S1-S2, regular rate rhythm Lungs: Clear anteriorly Abdomen: Soft, nontender, bowel  sounds present Extremities: No edema Neuro: Moves all extremities, no localizing signs, more awake today GU: Old blood, scant amount in the perivaginal space Skin: No rashes Psychiatry: Poor insight and judgment.   Data Reviewed:   CBC: Recent Labs  Lab 04/28/21 1519 04/29/21 0223 05/01/21 0224 05/02/21 0239 05/03/21 0556 05/04/21 1258 05/05/21 0211  WBC 6.1   < > 6.2 6.6 9.2 13.2* 14.5*  NEUTROABS 4.0  --   --   --   --   --   --   HGB 11.3*   < > 11.5* 11.4* 12.7 13.5 13.1  HCT 35.2*   < > 35.6* 35.4* 38.6 41.5 39.5  MCV 98.1   < > 94.4 94.7 93.9 93.7 93.6  PLT 218   < > 249 244 253 278 247   < > = values in this interval not displayed.   Basic Metabolic Panel: Recent Labs  Lab 05/01/21 0224 05/02/21 0239 05/03/21 0556 05/04/21 1258 05/05/21 0211  NA 138 139 139 139 136  K 3.8 3.7 3.9 4.2 4.1  CL 106 107 105 106 107  CO2 26 25 27 25 22   GLUCOSE 99 98 103* 118* 109*  BUN 15 19 21  28* 29*  CREATININE 1.11* 1.05* 1.20* 1.54* 1.40*  CALCIUM 11.4* 11.7* 12.3* 12.3* 11.4*   GFR: Estimated Creatinine Clearance: 20 mL/min (A) (by C-G formula based on SCr of 1.4 mg/dL (H)). Liver Function Tests: No results for input(s): AST, ALT, ALKPHOS, BILITOT, PROT, ALBUMIN in the last 168 hours. No results for input(s): LIPASE, AMYLASE in the last 168 hours. No results for input(s): AMMONIA in the last 168 hours. Coagulation Profile: No results for input(s): INR, PROTIME in the last 168 hours. Cardiac Enzymes: No results for input(s): CKTOTAL, CKMB, CKMBINDEX, TROPONINI in the last 168 hours. BNP (last 3 results) No results for input(s): PROBNP in the last 8760 hours. HbA1C: No results for input(s): HGBA1C in the last 72 hours. CBG: No results for input(s): GLUCAP in the last 168 hours. Lipid Profile: No results for input(s): CHOL, HDL, LDLCALC, TRIG, CHOLHDL, LDLDIRECT in the last 72 hours. Thyroid Function Tests: No results for input(s): TSH, T4TOTAL, FREET4, T3FREE,  THYROIDAB in the last 72 hours. Anemia Panel: No results for input(s): VITAMINB12, FOLATE, FERRITIN, TIBC, IRON, RETICCTPCT in the last 72 hours. Urine analysis:    Component Value Date/Time   COLORURINE AMBER (A) 05/04/2021 0949   APPEARANCEUR CLOUDY (A) 05/04/2021 0949   APPEARANCEUR Clear 04/16/2021 1053   LABSPEC 1.014 05/04/2021 0949   PHURINE 9.0 (H) 05/04/2021 0949   GLUCOSEU NEGATIVE 05/04/2021 0949   HGBUR SMALL (A) 05/04/2021 0949   BILIRUBINUR NEGATIVE 05/04/2021 0949   BILIRUBINUR Negative 04/16/2021 1053   KETONESUR 5 (A) 05/04/2021 0949   PROTEINUR 100 (A) 05/04/2021 0949   UROBILINOGEN 0.2 12/18/2019 1009   UROBILINOGEN 1.0 04/10/2013 2115   NITRITE NEGATIVE 05/04/2021 0949   LEUKOCYTESUR LARGE (A) 05/04/2021 0949   Sepsis Labs: @LABRCNTIP (procalcitonin:4,lacticidven:4)  ) Recent Results (from the past 240 hour(s))  Resp Panel by RT-PCR (Flu A&B, Covid) Nasopharyngeal Swab     Status: None   Collection Time: 04/28/21  3:17 PM   Specimen: Nasopharyngeal Swab; Nasopharyngeal(NP) swabs in vial transport medium  Result Value Ref Range Status   SARS Coronavirus 2 by RT PCR NEGATIVE  NEGATIVE Final    Comment: (NOTE) SARS-CoV-2 target nucleic acids are NOT DETECTED.  The SARS-CoV-2 RNA is generally detectable in upper respiratory specimens during the acute phase of infection. The lowest concentration of SARS-CoV-2 viral copies this assay can detect is 138 copies/mL. A negative result does not preclude SARS-Cov-2 infection and should not be used as the sole basis for treatment or other patient management decisions. A negative result may occur with  improper specimen collection/handling, submission of specimen other than nasopharyngeal swab, presence of viral mutation(s) within the areas targeted by this assay, and inadequate number of viral copies(<138 copies/mL). A negative result must be combined with clinical observations, patient history, and  epidemiological information. The expected result is Negative.  Fact Sheet for Patients:  EntrepreneurPulse.com.au  Fact Sheet for Healthcare Providers:  IncredibleEmployment.be  This test is no t yet approved or cleared by the [place] FDA and  has been authorized for detection and/or diagnosis of SARS-CoV-2 by FDA under an Emergency Use Authorization (EUA). This EUA will remain  in effect (meaning this test can be used) for the duration of the COVID-19 declaration under Section 564(b)(1) of the Act, 21 U.S.C.section 360bbb-3(b)(1), unless the authorization is terminated  or revoked sooner.       Influenza A by PCR NEGATIVE NEGATIVE Final   Influenza B by PCR NEGATIVE NEGATIVE Final    Comment: (NOTE) The Xpert Xpress SARS-CoV-2/FLU/RSV plus assay is intended as an aid in the diagnosis of influenza from Nasopharyngeal swab specimens and should not be used as a sole basis for treatment. Nasal washings and aspirates are unacceptable for Xpert Xpress SARS-CoV-2/FLU/RSV testing.  Fact Sheet for Patients: EntrepreneurPulse.com.au  Fact Sheet for Healthcare Providers: IncredibleEmployment.be  This test is not yet approved or cleared by the [place] FDA and has been authorized for detection and/or diagnosis of SARS-CoV-2 by FDA under an Emergency Use Authorization (EUA). This EUA will remain in effect (meaning this test can be used) for the duration of the COVID-19 declaration under Section 564(b)(1) of the Act, 21 U.S.C. section 360bbb-3(b)(1), unless the authorization is terminated or revoked.  Performed at [facility], [address], [place], [place] [address]   Culture, blood (routine x 2)     Status: None   Collection Time: 04/28/21  3:56 PM   Specimen: BLOOD RIGHT ARM  Result Value Ref Range Status   Specimen Description BLOOD RIGHT ARM  Final   Special Requests   Final    BOTTLES DRAWN  AEROBIC AND ANAEROBIC Blood Culture adequate volume   Culture   Final    NO GROWTH 5 DAYS Performed at [facility], [address], [place], [place] [address]    Report Status 05/03/2021 FINAL  Final  Culture, blood (routine x 2)     Status: None   Collection Time: 04/28/21  3:56 PM   Specimen: BLOOD LEFT HAND  Result Value Ref Range Status   Specimen Description BLOOD LEFT HAND  Final   Special Requests   Final    BOTTLES DRAWN AEROBIC AND ANAEROBIC Blood Culture adequate volume   Culture   Final    NO GROWTH 5 DAYS Performed at [facility], [address] [place], [place] [address]    Report Status 05/03/2021 FINAL  Final    Radiology Studies: CT HEAD WO CONTRAST (5MM)  Result Date: 05/04/2021 CLINICAL DATA:  Encephalopathy EXAM: CT HEAD WITHOUT CONTRAST TECHNIQUE: Contiguous axial images were obtained from the base of the skull through the vertex without  intravenous contrast. COMPARISON:  04/30/2021 FINDINGS: Brain: There is no mass, hemorrhage or extra-axial collection. There is generalized atrophy without lobar predilection. Hypodensity of the white matter is most commonly associated with chronic microvascular disease. Vascular: No abnormal hyperdensity of the major intracranial arteries or dural venous sinuses. No intracranial atherosclerosis. Skull: The visualized skull base, calvarium and extracranial soft tissues are normal. Sinuses/Orbits: No fluid levels or advanced mucosal thickening of the visualized paranasal sinuses. No mastoid or middle ear effusion. The orbits are normal. IMPRESSION: Generalized atrophy and chronic microvascular ischemia without acute intracranial abnormality. Electronically Signed   By: Ulyses Jarred M.D.   On: 05/04/2021 19:01     Scheduled Meds:  apixaban  5 mg Oral BID   atorvastatin  40 mg Oral Daily   darifenacin  7.5 mg Oral Daily   pantoprazole  40 mg Oral Daily   senna-docusate  1 tablet Oral BID   verapamil  180 mg Oral QHS   Continuous  Infusions:  sodium chloride 75 mL/hr at 05/04/21 1624   cefTRIAXone (ROCEPHIN)  IV 1 g (05/04/21 1404)    LOS: 7 days    Time spent: 66min  Domenic Polite, MD [facility]   05/05/2021, 10:35 AM

## 2021-05-05 NOTE — Consult Note (Signed)
   [place] Inpatient Consult   05/05/2021  Kanya T Lightner [birth year] [id number]  [facility] [ACO] Patient: Bernadene Person  Primary Care Provider:  [name], DO, [place] is an embedded provider with a Chronic Care Management team and program, and is listed for the transition of care follow up and appointments.  Patient was screened for Embedded practice service needs for chronic care management and was active with Embedded RN Care Coordinator. Progress notes reveals patient was previously at a skilled nursing facility and LCSW notes for returning to SNF is the current disposition noted.  Plan: Follow  up for final disposition.  Notification to be sent to the [facility] and make aware of TOC needs for post hospital needs and disposition.  Please contact for further questions,  Natividad Brood, RN BSN [facility] Liaison  [phone] business mobile phone Toll free office [phone]  Fax number: [phone] [email or web address]

## 2021-05-05 NOTE — Progress Notes (Signed)
41- When RN was cleaning patient, RN noticed a significant amount of blood in brief coming from the vagina. When wiping area patient seems to be in a lot of pain. Patient did receive a urine culture yesterday morning and noted to have a UTI and being treated with Rocephin. RN looked through notes and did not see any documentation of blood in urine.   84- RN paged on call hospitalists to see if any new interventions needed to take place.   0600-At current time no new orders. Will pass information on to oncoming RN

## 2021-05-06 DIAGNOSIS — R627 Adult failure to thrive: Secondary | ICD-10-CM

## 2021-05-06 DIAGNOSIS — Z7189 Other specified counseling: Secondary | ICD-10-CM | POA: Diagnosis not present

## 2021-05-06 DIAGNOSIS — N183 Chronic kidney disease, stage 3 unspecified: Secondary | ICD-10-CM | POA: Diagnosis not present

## 2021-05-06 DIAGNOSIS — Z515 Encounter for palliative care: Secondary | ICD-10-CM

## 2021-05-06 DIAGNOSIS — F0390 Unspecified dementia without behavioral disturbance: Secondary | ICD-10-CM | POA: Diagnosis not present

## 2021-05-06 DIAGNOSIS — I629 Nontraumatic intracranial hemorrhage, unspecified: Secondary | ICD-10-CM | POA: Diagnosis not present

## 2021-05-06 DIAGNOSIS — I10 Essential (primary) hypertension: Secondary | ICD-10-CM | POA: Diagnosis not present

## 2021-05-06 LAB — CBC
HCT: 38.8 % (ref 36.0–46.0)
Hemoglobin: 12.8 g/dL (ref 12.0–15.0)
MCH: 30.9 pg (ref 26.0–34.0)
MCHC: 33 g/dL (ref 30.0–36.0)
MCV: 93.7 fL (ref 80.0–100.0)
Platelets: 250 10*3/uL (ref 150–400)
RBC: 4.14 MIL/uL (ref 3.87–5.11)
RDW: 14.4 % (ref 11.5–15.5)
WBC: 18.5 10*3/uL — ABNORMAL HIGH (ref 4.0–10.5)
nRBC: 0 % (ref 0.0–0.2)

## 2021-05-06 LAB — BASIC METABOLIC PANEL
Anion gap: 9 (ref 5–15)
BUN: 31 mg/dL — ABNORMAL HIGH (ref 8–23)
CO2: 19 mmol/L — ABNORMAL LOW (ref 22–32)
Calcium: 10.8 mg/dL — ABNORMAL HIGH (ref 8.9–10.3)
Chloride: 110 mmol/L (ref 98–111)
Creatinine, Ser: 1.76 mg/dL — ABNORMAL HIGH (ref 0.44–1.00)
GFR, Estimated: 27 mL/min — ABNORMAL LOW (ref >60)
Glucose, Bld: 133 mg/dL — ABNORMAL HIGH (ref 70–99)
Potassium: 3.8 mmol/L (ref 3.5–5.1)
Sodium: 138 mmol/L (ref 135–145)

## 2021-05-06 MED ORDER — BISACODYL 10 MG RE SUPP: 10.0000 mg | Freq: Every day | RECTAL | Status: DC | PRN

## 2021-05-06 MED ORDER — LACTATED RINGERS IV SOLN: INTRAVENOUS | Status: DC

## 2021-05-06 MED ORDER — MORPHINE SULFATE (CONCENTRATE) 10 MG/0.5ML PO SOLN: 5.0000 mg | ORAL | Status: DC | PRN

## 2021-05-06 NOTE — Progress Notes (Signed)
Occupational Therapy Treatment Patient Details Name: Amanda [name] MRN: [id number] DOB: 1931/04/16 Today's Date: 05/06/2021   History of present illness 87 y/o female presented to [facility] ED on 11/23 for complaints of O2 sats in upper 80's overnight. Recently diagnosed with PNA. Admitted 10/6-10/24/22 for signifcant fall with acute bifrontal hemorrhage and small SAH and discharged to SNF. CTA chest revealed small scatter pulmonary emboli. [place]doch concerning for acute hemorrhage in anterior R frontal lobe. Repeat CTH showed interval resolution of hyperdensity in R frontal lobe which may be resolving SAH. Transferred to [facility]. PMH: dementia, HTN, CVA, CAD   OT comments  Pt with lethargy limiting participation, so not progressing towards OT goals at this time. No verbalizations at all despite multiple and multi-modal attempts at engaging patient. Pt did exhbit improved sitting balance this session sustaining brief moments of min guard, R lateral lean and posterior lean still present. Despite multiple attempts at engaging Pt in ADL - grooming tasks etc Pt is currently total A for all aspects of ADL. Total A to return to supine. OT will continue to follow acutely - acknowledge meeting with Palliative services later today. And can adjust OT service recommendations appropriately.    Recommendations for follow up therapy are one component of a multi-disciplinary discharge planning process, led by the attending physician.  Recommendations may be updated based on patient status, additional functional criteria and insurance authorization.    Follow Up Recommendations  Skilled nursing-short term rehab (<3 hours/day)    Assistance Recommended at Discharge Frequent or constant Supervision/Assistance  Equipment Recommendations  BSC/3in1;Wheelchair (measurements OT)    Recommendations for Other Services      Precautions / Restrictions Precautions Precautions: Fall Restrictions Weight Bearing Restrictions: No        Mobility Bed Mobility Overal bed mobility: Needs Assistance Bed Mobility: Sit to Supine;Supine to Sit     Supine to sit: Max assist;+2 for physical assistance;+2 for safety/equipment;HOB elevated Sit to supine: Total assist;+2 for physical assistance;+2 for safety/equipment   General bed mobility comments: assist for trunk and LE management, scooting to/from EOB, heavy use of bed pad to assist    Transfers Overall transfer level: Needs assistance   Transfers: Bed to chair/wheelchair/BSC            Lateral/Scoot Transfers: +2 physical assistance;+2 safety/equipment;Max assist General transfer comment: unable today given level of arousal and poor sitting balance     Balance Overall balance assessment: Needs assistance Sitting-balance support: Feet supported Sitting balance-Leahy Scale: Poor Sitting balance - Comments: improved from last session, brief moments of min guard - most of the time min A, R lateral lean still present Postural control: Posterior lean;Right lateral lean Standing balance support: Bilateral upper extremity supported Standing balance-Leahy Scale: Zero Standing balance comment: unable to come to full stand today                           ADL either performed or assessed with clinical judgement   ADL Overall ADL's : Needs assistance/impaired     Grooming: Brushing hair;Sitting;Total assistance Grooming Details (indicate cue type and reason): would not verbalize what the comb was or what it was for, did not attempt to take comb when offered         Upper Body Dressing : Total assistance;Sitting Upper Body Dressing Details (indicate cue type and reason): to don gown     Toilet Transfer: Total assistance Toilet Transfer Details (indicate cue type and reason): squat lateral  up the bed simulated Toileting- Clothing Manipulation and Hygiene: Total assistance;Bed level Toileting - Clothing Manipulation Details (indicate cue type and  reason): bed level, rolling     Functional mobility during ADLs: Total assistance;+2 for physical assistance;+2 for safety/equipment (lateral squat scoot up the bed)      Extremity/Trunk Assessment Upper Extremity Assessment Upper Extremity Assessment: Generalized weakness (did not lift arms past 30 degrees flexion at elbow, or 20 degrees shoulder FF)   Lower Extremity Assessment Lower Extremity Assessment: Defer to PT evaluation        Vision       Perception     Praxis      Cognition Arousal/Alertness: Lethargic Behavior During Therapy: Flat affect Overall Cognitive Status: Impaired/Different from baseline Area of Impairment: Attention;Following commands;Safety/judgement;Awareness;Problem solving                   Current Attention Level: Focused   Following Commands: Follows one step commands inconsistently;Follows one step commands with increased time   Awareness: Intellectual Problem Solving: Slow processing;Decreased initiation;Difficulty sequencing;Requires verbal cues;Requires tactile cues General Comments: pt lethargic, very inconsistently follows commands. no verbal communication this session. raising eyebrows, and noddding yes/no. sometimes quirks a smile.          Exercises General Exercises - Lower Extremity Ankle Circles/Pumps: PROM;Both;10 reps;Supine Heel Slides: AAROM;Both;10 reps;Supine (cues for "push my hand" when moving from hip and knee flexion to extension)   Shoulder Instructions       General Comments      Pertinent Vitals/ Pain       Pain Assessment: Faces Faces Pain Scale: No hurt Breathing: normal Negative Vocalization: none Facial Expression: smiling or inexpressive Body Language: relaxed Consolability: no need to console PAINAD Score: 0 Pain Intervention(s): Monitored during session;Repositioned  Home Living                                          Prior Functioning/Environment               Frequency  Min 2X/week        Progress Toward Goals  OT Goals(current goals can now be found in the care plan section)  Progress towards OT goals: Not progressing toward goals - comment (minimally participative)  Acute Rehab OT Goals Patient Stated Goal: none stated OT Goal Formulation: Patient unable to participate in goal setting Time For Goal Achievement: 05/14/21 Potential to Achieve Goals: Good  Plan      Co-evaluation    PT/OT/SLP Co-Evaluation/Treatment: Yes Reason for Co-Treatment: Complexity of the patient's impairments (multi-system involvement);Necessary to address cognition/behavior during functional activity;To address functional/ADL transfers PT goals addressed during session: Mobility/safety with mobility;Balance;Strengthening/ROM OT goals addressed during session: ADL's and self-care;Strengthening/ROM      AM-PAC OT "6 Clicks" Daily Activity     Outcome Measure   Help from another person eating meals?: Total Help from another person taking care of personal grooming?: Total Help from another person toileting, which includes using toliet, bedpan, or urinal?: Total Help from another person bathing (including washing, rinsing, drying)?: Total Help from another person to put on and taking off regular upper body clothing?: Total Help from another person to put on and taking off regular lower body clothing?: Total 6 Click Score: 6    End of Session Equipment Utilized During Treatment: Gait belt  OT Visit Diagnosis: Unsteadiness on feet (R26.81);Cognitive communication deficit (R41.841)  Activity Tolerance Patient limited by lethargy   Patient Left in bed;with call bell/phone within reach;with bed alarm set   Nurse Communication Mobility status        Time: [id number] OT Time Calculation (min): 27 min  Charges: OT General Charges $OT Visit: 1 Visit OT Treatments $Self Care/Home Management : 8-22 mins  Jesse Sans OTR/L Acute Rehabilitation  Services Pager: [phone] Office: [place] 05/06/2021, 10:25 AM

## 2021-05-06 NOTE — Progress Notes (Addendum)
[facility]  PROGRESS NOTE  Amanda [name] OHY:[id number] DOB: 1930-12-23 DOA: 04/28/2021 PCP: [name], DO   Brief HPI:   87 year old female with history of dementia, hypertension, dyslipidemia recently discharged to skilled from [facility] following hospitalization for traumatic intracranial hemorrhage following a fall.  Presented to [facility] ED with complaints of shortness of breath.  Was found to mild hypoxic to 88% on room air on arrival.  CTA chest in the ED showed small scattered bilateral pulmonary emboli and CT head showed acute hemorrhage in the anterior right frontal lobe she was sent to the [facility] for further evaluation.  Seen by neurosurgery, started on low-dose heparin drip without bolus.  She remained stable on IV heparin, repeat imaging was stable, ultimately transitioned to oral Eliquis. She was found to be lethargic on 11/29, started on IV ceftriaxone for worsening leukocytosis and cloudy urine.    Subjective   Patient seen and examined, appears lethargic however following commands.  Daughter wants to discuss with palliative care regarding goals of care.   Assessment/Plan:    Intracranial hemorrhage -Secondary to fall -Neurosurgery consulted -Patient started on low-dose heparin for pulmonary embolism: Switched to  Eliquis -Repeat CT head unremarkable, neurosurgery has signed off -Recommend to continue with Eliquis  Acute toxic encephalopathy/UTI -Improved clinically -Patient was poorly responsive yesterday, was not interacting with minimal oral intake -UA obtained yesterday was cloudy, more than 50 RBCs per high-power field -Patient started on IV ceftriaxone; urine culture showed multiple species -Patient has clinically improved with IV ceftriaxone, will continue with IV ceftriaxone and treat for 3 days  Leukocytosis -WBC elevated at 18,000 -?  Infectious etiology -She is afebrile, started on IV ceftriaxone as above -Follow CBC in  a.m.  Pulmonary embolism New diagnosis, found on CTA which showed few scattered tiny pulmonary emboli extending filling defects -Started on low-dose heparin -Transitioned to p.o. apixaban  Acute kidney injury -Creatinine is elevated at 1.76, up from 1.50 yesterday -Start LR at 75 mill per hour -Avoid nephrotoxins  Acute hypoxemic respiratory failure -Secondary to PE as above -Resolved, patient not requiring oxygen  Dementia -Stable  Hyperlipidemia -Continue statin  Hypertension -Continue verapamil  Goals of care -Called and discussed with patient's daughter on phone, there was discussion regarding comfort measures.  I will consult palliative care for further clarification of goals of care.   Medications     apixaban  5 mg Oral BID   atorvastatin  40 mg Oral Daily   darifenacin  7.5 mg Oral Daily   pantoprazole  40 mg Oral Daily   senna-docusate  1 tablet Oral BID   verapamil  180 mg Oral QHS     Data Reviewed:   CBG:  No results for input(s): GLUCAP in the last 168 hours.  SpO2: 93 %    Vitals:   05/05/21 1948 05/05/21 2334 05/06/21 0326 05/06/21 0744  BP: (!) 176/80 (!) 167/90 136/82 (!) 147/81  Pulse: 92 88 97 96  Resp: 18 20 17 15   Temp: 98.4 F (36.9 C) 98.2 F (36.8 C)  99.1 F (37.3 C)  TempSrc: Oral Oral  Axillary  SpO2: 93% 94% 94% 93%  Weight:      Height:         Intake/Output Summary (Last 24 hours) at 05/06/2021 0835 Last data filed at 05/06/2021 0815 Gross per 24 hour  Intake 100 ml  Output --  Net 100 ml    11/29 1901 - 12/01 0700 In: 709.2 [I.V.:709.2]  Out: -   Filed Weights   04/28/21 1507  Weight: 50.8 kg    Data Reviewed: Basic Metabolic Panel: Recent Labs  Lab 05/02/21 0239 05/03/21 0556 05/04/21 1258 05/05/21 0211 05/06/21 0133  NA 139 139 139 136 138  K 3.7 3.9 4.2 4.1 3.8  CL 107 105 106 107 110  CO2 25 27 25 22  19*  GLUCOSE 98 103* 118* 109* 133*  BUN 19 21 28* 29* 31*  CREATININE 1.05* 1.20* 1.54*  1.40* 1.76*  CALCIUM 11.7* 12.3* 12.3* 11.4* 10.8*   Liver Function Tests: No results for input(s): AST, ALT, ALKPHOS, BILITOT, PROT, ALBUMIN in the last 168 hours. No results for input(s): LIPASE, AMYLASE in the last 168 hours. No results for input(s): AMMONIA in the last 168 hours. CBC: Recent Labs  Lab 05/02/21 0239 05/03/21 0556 05/04/21 1258 05/05/21 0211 05/06/21 0133  WBC 6.6 9.2 13.2* 14.5* 18.5*  HGB 11.4* 12.7 13.5 13.1 12.8  HCT 35.4* 38.6 41.5 39.5 38.8  MCV 94.7 93.9 93.7 93.6 93.7  PLT 244 253 278 247 250   Cardiac Enzymes: No results for input(s): CKTOTAL, CKMB, CKMBINDEX, TROPONINI in the last 168 hours. BNP (last 3 results) Recent Labs    04/28/21 1519  BNP 57.0    ProBNP (last 3 results) No results for input(s): PROBNP in the last 8760 hours.  CBG: No results for input(s): GLUCAP in the last 168 hours.     Radiology Reports  CT HEAD WO CONTRAST (5MM)  Result Date: 05/04/2021 CLINICAL DATA:  Encephalopathy EXAM: CT HEAD WITHOUT CONTRAST TECHNIQUE: Contiguous axial images were obtained from the base of the skull through the vertex without intravenous contrast. COMPARISON:  04/30/2021 FINDINGS: Brain: There is no mass, hemorrhage or extra-axial collection. There is generalized atrophy without lobar predilection. Hypodensity of the white matter is most commonly associated with chronic microvascular disease. Vascular: No abnormal hyperdensity of the major intracranial arteries or dural venous sinuses. No intracranial atherosclerosis. Skull: The visualized skull base, calvarium and extracranial soft tissues are normal. Sinuses/Orbits: No fluid levels or advanced mucosal thickening of the visualized paranasal sinuses. No mastoid or middle ear effusion. The orbits are normal. IMPRESSION: Generalized atrophy and chronic microvascular ischemia without acute intracranial abnormality. Electronically Signed   By: Ulyses Jarred M.D.   On: 05/04/2021 19:01   US PELVIS  (TRANSABDOMINAL ONLY)  Result Date: 05/05/2021 CLINICAL DATA:  Vaginal discharge. EXAM: TRANSABDOMINAL ULTRASOUND OF PELVIS TECHNIQUE: Transabdominal ultrasound examination of the pelvis was performed including evaluation of the uterus, ovaries, adnexal regions, and pelvic cul-de-sac. COMPARISON:  None. FINDINGS: Evaluation is very limited as the patient could not cooperate with exam. Uterus Hysterectomy. Endometrium Hysterectomy. Right ovary Not visualized. Left ovary Not visualized. Other findings: There is large amount of layering debris within the urinary bladder. Correlation with clinical exam and urinalysis recommended. IMPRESSION: 1. Large debris within the urinary bladder. 2. Hysterectomy. 3. Nonvisualization of the ovaries. Electronically Signed   By: Anner Crete M.D.   On: 05/05/2021 23:31       Antibiotics: Anti-infectives (From admission, onward)    Start     Dose/Rate Route Frequency Ordered Stop   05/04/21 1430  cefTRIAXone (ROCEPHIN) 1 g in sodium chloride 0.9 % 100 mL IVPB        1 g 200 mL/hr over 30 Minutes Intravenous Every 24 hours 05/04/21 1344           DVT prophylaxis: Apixaban  Code Status: DNR  Family Communication: Spoke with daughter on phone  Consultants: Neurosurgery  Procedures:     Objective    Physical Examination:   General: Lethargic Cardiovascular: S1-S2, regular, grade 3/6 systolic murmur mitral area Respiratory: Clear to auscultation bilaterally Abdomen: Abdomen is soft, nontender, no organomegaly daily Extremities: No edema in the lower extremities Neurologic: Alert, oriented to self, following commands   Status is: Inpatient  Dispo: The patient is from: Home              Anticipated d/c is to: To be decided              Anticipated d/c date is: 05/08/2021              Patient currently not stable for discharge  Barrier to discharge-ongoing management for pulmonary embolism, intracranial hemorrhage  COVID-19  Labs  No results for input(s): DDIMER, FERRITIN, LDH, CRP in the last 72 hours.  Lab Results  Component Value Date   SARSCOV2NAA NEGATIVE 04/28/2021   [place] NEGATIVE 03/29/2021   [place] NEGATIVE 03/11/2021   [place] NEGATIVE 12/31/2020            Recent Results (from the past 240 hour(s))  Resp Panel by RT-PCR (Flu A&B, Covid) Nasopharyngeal Swab     Status: None   Collection Time: 04/28/21  3:17 PM   Specimen: Nasopharyngeal Swab; Nasopharyngeal(NP) swabs in vial transport medium  Result Value Ref Range Status   SARS Coronavirus 2 by RT PCR NEGATIVE NEGATIVE Final    Comment: (NOTE) SARS-CoV-2 target nucleic acids are NOT DETECTED.  The SARS-CoV-2 RNA is generally detectable in upper respiratory specimens during the acute phase of infection. The lowest concentration of SARS-CoV-2 viral copies this assay can detect is 138 copies/mL. A negative result does not preclude SARS-Cov-2 infection and should not be used as the sole basis for treatment or other patient management decisions. A negative result may occur with  improper specimen collection/handling, submission of specimen other than nasopharyngeal swab, presence of viral mutation(s) within the areas targeted by this assay, and inadequate number of viral copies(<138 copies/mL). A negative result must be combined with clinical observations, patient history, and epidemiological information. The expected result is Negative.  Fact Sheet for Patients:  EntrepreneurPulse.com.au  Fact Sheet for Healthcare Providers:  IncredibleEmployment.be  This test is no t yet approved or cleared by the [place] FDA and  has been authorized for detection and/or diagnosis of SARS-CoV-2 by FDA under an Emergency Use Authorization (EUA). This EUA will remain  in effect (meaning this test can be used) for the duration of the COVID-19 declaration under Section 564(b)(1) of the Act,  21 U.S.C.section 360bbb-3(b)(1), unless the authorization is terminated  or revoked sooner.       Influenza A by PCR NEGATIVE NEGATIVE Final   Influenza B by PCR NEGATIVE NEGATIVE Final    Comment: (NOTE) The Xpert Xpress SARS-CoV-2/FLU/RSV plus assay is intended as an aid in the diagnosis of influenza from Nasopharyngeal swab specimens and should not be used as a sole basis for treatment. Nasal washings and aspirates are unacceptable for Xpert Xpress SARS-CoV-2/FLU/RSV testing.  Fact Sheet for Patients: EntrepreneurPulse.com.au  Fact Sheet for Healthcare Providers: IncredibleEmployment.be  This test is not yet approved or cleared by the [place] FDA and has been authorized for detection and/or diagnosis of SARS-CoV-2 by FDA under an Emergency Use Authorization (EUA). This EUA will remain in effect (meaning this test can be used) for the duration of the COVID-19 declaration under Section 564(b)(1) of the Act, 21 U.S.C. section 360bbb-3(b)(1),  unless the authorization is terminated or revoked.  Performed at [facility], [address] [place], [place] [address]   Culture, blood (routine x 2)     Status: None   Collection Time: 04/28/21  3:56 PM   Specimen: BLOOD RIGHT ARM  Result Value Ref Range Status   Specimen Description BLOOD RIGHT ARM  Final   Special Requests   Final    BOTTLES DRAWN AEROBIC AND ANAEROBIC Blood Culture adequate volume   Culture   Final    NO GROWTH 5 DAYS Performed at [facility], [address] [place], [place] [address]    Report Status 05/03/2021 FINAL  Final  Culture, blood (routine x 2)     Status: None   Collection Time: 04/28/21  3:56 PM   Specimen: BLOOD LEFT HAND  Result Value Ref Range Status   Specimen Description BLOOD LEFT HAND  Final   Special Requests   Final    BOTTLES DRAWN AEROBIC AND ANAEROBIC Blood Culture adequate volume   Culture   Final    NO GROWTH 5 DAYS Performed at [facility], [address], [place], [place] [address]    Report Status 05/03/2021 FINAL  Final  Urine Culture     Status: Abnormal   Collection Time: 05/04/21 12:39 PM   Specimen: Urine, Clean Catch  Result Value Ref Range Status   Specimen Description URINE, CLEAN CATCH  Final   Special Requests   Final    NONE Performed at [facility], [place] [address] [place], [place] [address]    Culture MULTIPLE SPECIES PRESENT, SUGGEST RECOLLECTION (A)  Final   Report Status 05/05/2021 FINAL  Final    Oswald Hillock   [facility] If 7PM-7AM, please contact night-coverage at [email or web address], Office  [phone]   05/06/2021, 8:35 AM  LOS: 8 days

## 2021-05-06 NOTE — Progress Notes (Signed)
Physical Therapy Treatment Patient Details Name: Amanda [name] MRN: [id number] DOB: 1930/08/06 Today's Date: 05/06/2021   History of Present Illness 87 y/o female presented to [facility] on 11/23 for complaints of O2 sats in upper 80's overnight. Recently diagnosed with PNA. Admitted 10/6-10/24/22 for signifcant fall with acute bifrontal hemorrhage and small SAH and discharged to SNF. CTA chest revealed small scatter pulmonary emboli. [place] concerning for acute hemorrhage in anterior R frontal lobe. Repeat CTH showed interval resolution of hyperdensity in R frontal lobe which may be resolving SAH. Transferred to [facility]. PMH: dementia, HTN, CVA, CAD    PT Comments    Pt drowsy throughout session, does nod yes/no to questions but inconsistently following commands. Pt requiring max-total +2 for bed-level mobility, unable to progress to standing today even with total +2. Pt not progressing towards PT goals at this time, recommend palliative consult to determine continued plan of care. PT to continue to follow in the meantime.      Recommendations for follow up therapy are one component of a multi-disciplinary discharge planning process, led by the attending physician.  Recommendations may be updated based on patient status, additional functional criteria and insurance authorization.  Follow Up Recommendations  Skilled nursing-short term rehab (<3 hours/day)     Assistance Recommended at Discharge Frequent or constant Supervision/Assistance  Equipment Recommendations  Other (comment) (defer to next venue)    Recommendations for Other Services       Precautions / Restrictions Precautions Precautions: Fall Restrictions Weight Bearing Restrictions: No     Mobility  Bed Mobility Overal bed mobility: Needs Assistance Bed Mobility: Sit to Supine;Supine to Sit     Supine to sit: Max assist;+2 for physical assistance;+2 for safety/equipment;HOB elevated Sit to supine: Total assist;+2 for physical  assistance;+2 for safety/equipment   General bed mobility comments: assist for trunk and LE management, scooting to/from EOB, heavy use of bed pad to assist    Transfers Overall transfer level: Needs assistance   Transfers: Bed to chair/wheelchair/BSC            Lateral/Scoot Transfers: +2 physical assistance;+2 safety/equipment;Max assist General transfer comment: unable today given level of arousal and poor sitting balance; scooting towards L with boost assist with pad and heavy +2    Ambulation/Gait               General Gait Details: unable   Stairs             Wheelchair Mobility    Modified Rankin (Stroke Patients Only)       Balance Overall balance assessment: Needs assistance Sitting-balance support: Feet supported Sitting balance-Leahy Scale: Poor Sitting balance - Comments: improved from last session, brief moments of min guard - most of the time min A, R lateral lean still present Postural control: Posterior lean;Right lateral lean Standing balance support: Bilateral upper extremity supported Standing balance-Leahy Scale: Zero Standing balance comment: unable to come to full stand today                            Cognition Arousal/Alertness: Lethargic Behavior During Therapy: Flat affect Overall Cognitive Status: Impaired/Different from baseline Area of Impairment: Attention;Following commands;Safety/judgement;Awareness;Problem solving                   Current Attention Level: Focused   Following Commands: Follows one step commands inconsistently;Follows one step commands with increased time   Awareness: Intellectual Problem Solving: Slow processing;Decreased initiation;Difficulty sequencing;Requires verbal  cues;Requires tactile cues General Comments: pt lethargic, very inconsistently follows commands. no verbal communication this session. raising eyebrows, and noddding yes/no. sometimes demonstrates a smile.         Exercises General Exercises - Lower Extremity Heel Slides: AAROM;Both;10 reps;Supine (cues for hip and knee extension moving from flex>ext) Hip ABduction/ADduction: AAROM;Both;10 reps;Supine    General Comments        Pertinent Vitals/Pain Pain Assessment: Faces Faces Pain Scale: No hurt Pain Intervention(s): Monitored during session    Home Living                          Prior Function            PT Goals (current goals can now be found in the care plan section) Acute Rehab PT Goals Patient Stated Goal: did not state PT Goal Formulation: Patient unable to participate in goal setting Time For Goal Achievement: 05/15/21 Potential to Achieve Goals: Fair Progress towards PT goals: Not progressing toward goals - comment (lethargy, limited command following)    Frequency    Min 2X/week      PT Plan Current plan remains appropriate    Co-evaluation PT/OT/SLP Co-Evaluation/Treatment: Yes Reason for Co-Treatment: For patient/therapist safety;To address functional/ADL transfers;Complexity of the patient's impairments (multi-system involvement);Necessary to address cognition/behavior during functional activity PT goals addressed during session: Mobility/safety with mobility;Balance;Strengthening/ROM        AM-PAC PT "6 Clicks" Mobility   Outcome Measure  Help needed turning from your back to your side while in a flat bed without using bedrails?: Total Help needed moving from lying on your back to sitting on the side of a flat bed without using bedrails?: Total Help needed moving to and from a bed to a chair (including a wheelchair)?: Total Help needed standing up from a chair using your arms (e.g., wheelchair or bedside chair)?: Total Help needed to walk in hospital room?: Total Help needed climbing 3-5 steps with a railing? : Total 6 Click Score: 6    End of Session Equipment Utilized During Treatment: Gait belt Activity Tolerance: Patient limited by  lethargy Patient left: in bed;with call bell/phone within reach;with bed alarm set Nurse Communication: Mobility status PT Visit Diagnosis: Muscle weakness (generalized) (M62.81);Unsteadiness on feet (R26.81);Difficulty in walking, not elsewhere classified (R26.2)     Time: 1610-9604 PT Time Calculation (min) (ACUTE ONLY): 29 min  Charges:  $Therapeutic Activity: 8-22 mins                     Stacie Glaze, PT DPT Acute Rehabilitation Services Pager [phone]  Office [phone]    Roxine Caddy E Ruffin Pyo 05/06/2021, 2:12 PM

## 2021-05-06 NOTE — Progress Notes (Signed)
CT brain reviewed, no new abnormality. Ok for anticoagulation as discussed.  No neurosurgery followup needed acutely. Please call with questions or concerns.     Thank you for allowing me to participate in this patient's care.  Please do not hesitate to call with questions or concerns.   Elwin Sleight, [facility] Cell: [phone]

## 2021-05-06 NOTE — Consult Note (Addendum)
Consultation Note Date: 05/06/2021   Patient Name: Amanda [name]  DOB: 09-30-1930  MRN: [id number]  Age / Sex: 87 y.o., female  PCP: [name], DO Referring Physician: Oswald Hillock, MD  Reason for Consultation: Establishing goals of care  HPI/Patient Profile: 87 y.o. female  with past medical history of dementia, stroke, hypertension, dyslipidemia, impaired renal function, diverticulosis, GERD, anxiety, recent admission for traumatic intracranial hemorrhage following fall -03/29/21 admitted on 04/28/2021 with shortness of breath with recently diagnosed pneumonia but also found new scattered small pulmonary emboli along with what is thought to be new SAH per neurosurgery. Began on low dose heparin infusion but some concern for signs of blood from urine or vagina areas. Appears to have failure to thrive in setting of acute illness and complications with underlying dementia.   Clinical Assessment and Goals of Care: I spoke with Franciso Bend, RN who has been caring for [name] since last Friday. She reports significant decline over hosptialization and now [name] is weak, not eating/drinking, not talking, not following commands although noted that she may at times but inconsistently. Unable to take pills. RN reports that daughter Clarise Cruz has indicated desire for comfort care. Ms. Scruggs is thin, cachectic, pale, and does not respond other than opening eyes to me.   I called and spoke with daughter Clarise Cruz. Clarise Cruz confirms that she is her mother's HCPOA. We discussed her mother's current status and ongoing decline despite interventions here in the hospital. I expressed concern that her mother is getting worse instead of better and appears to be approaching end of life. Sheilah verbalizes understanding and shares that her mother is at baseline a very active person and this is not her at all. Clarise Cruz does not want her mother  to suffer. We discussed options from here and I recommend consideration of hospice support to ensure her mother's comfort. We discussed hospice facility vs hospice at home and Clarise Cruz believes that her mother would be happiest in her home where she can be in her own bed and with her dog and family. Clarise Cruz has experience with other family members with hospice in the home. Amanda [name] shares that her mother volunteered with [facility] (among many other organizations she volunteered) and would like to work with them to get her mother home. We discussed no more sticking or poking or labs and just allowing her mother to rest and provide medication if she appears uncomfortable in any way. Clarise Cruz agrees with plan.   All questions/concerns addressed. Emotional support provided.   Primary Decision Maker HCPOA daughter Clarise Cruz    SUMMARY OF RECOMMENDATIONS   - Return home with hospice support  Code Status/Advance Care Planning: DNR   Symptom Management:  PRN medication to ensure comfort.   Palliative Prophylaxis:  Aspiration, Bowel Regimen, Delirium Protocol, Frequent Pain Assessment, Oral Care, and Turn Reposition  Additional Recommendations (Limitations, Scope, Preferences): Full Comfort Care  Psycho-social/Spiritual:  Desire for further Chaplaincy support:yes - [place] Additional Recommendations: Education on Hospice and Grief/Bereavement Support  Prognosis:  <  2 weeks  Discharge Planning: Home with Hospice      Primary Diagnoses: Present on Admission:  CKD (chronic kidney disease) stage 3, GFR 30-59 ml/min (HCC)  Essential hypertension  GERD   I have reviewed the medical record, interviewed the patient and family, and examined the patient. The following aspects are pertinent.  Past Medical History:  Diagnosis Date   Allergy    Anxiety    Coronary artery disease    LAD 30% followed by 60% stenosis; pressure wire measurement demonstrated no significant gradient;  circumflex had 30% stenosis, right coronary artery has 40% stenosed; EF 75-80%   Diverticulosis    GERD (gastroesophageal reflux disease)    Hematuria    Hyperlipidemia    Hypertension    Impaired renal function 04/23/2018   Stroke ([place]) 2003   With a left MCA occlusion   TIA (transient ischemic attack)    Social History   Socioeconomic History   Marital status: Widowed    Spouse name: Not on file   Number of children: 2   Years of education: 12   Highest education level: High school graduate  Occupational History   Occupation: Horticulturist, commercial: RETIRED    Comment: Retired  Tobacco Use   Smoking status: Former    Packs/day: 0.25    Years: 3.00    Pack years: 0.75    Types: Cigarettes    Quit date: 06/06/1985    Years since quitting: 35.9   Smokeless tobacco: Never   Tobacco comments:    Approximately 10-pack-year history  Vaping Use   Vaping Use: Never used  Substance and Sexual Activity   Alcohol use: No   Drug use: No   Sexual activity: Not Currently  Other Topics Concern   Not on file  Social History Narrative   04/19/21 residing at [facility] in [place] with her son.Has been widowed for about 3 years.   Social Determinants of Health   Financial Resource Strain: Not on file  Food Insecurity: Not on file  Transportation Needs: Not on file  Physical Activity: Not on file  Stress: Not on file  Social Connections: Not on file   Family History  Problem Relation Age of Onset   Colon cancer Mother 49       dx on colonoscopy    Cancer Mother        colon   Cancer Father        unsure if cancer  --- tumor on brain   Heart attack Sister    Heart attack Brother    Diabetes Brother    GI problems Son    COPD Sister    Hypertension Brother    Heart disease Brother    Heart attack Brother    Heart disease Brother    Alcohol abuse Brother    Scheduled Meds:  apixaban  5 mg Oral BID   atorvastatin  40 mg Oral Daily    darifenacin  7.5 mg Oral Daily   pantoprazole  40 mg Oral Daily   senna-docusate  1 tablet Oral BID   verapamil  180 mg Oral QHS   Continuous Infusions:  cefTRIAXone (ROCEPHIN)  IV 1 g (05/06/21 1417)   lactated ringers     PRN Meds:.guaiFENesin-dextromethorphan, ondansetron (ZOFRAN) IV, polyethylene glycol Allergies  Allergen Reactions   Livalo [Pitavastatin] Other (See Comments)    Causes dizziness   Simvastatin Other (See Comments)    Causes dizziness  Lisinopril     Hallucinations, resolved on ARB   Review of Systems  Unable to perform ROS: Acuity of condition   Physical Exam Vitals and nursing note reviewed.  Constitutional:      Appearance: She is cachectic. She is ill-appearing.  Cardiovascular:     Rate and Rhythm: Normal rate.  Pulmonary:     Effort: No tachypnea, accessory muscle usage or respiratory distress.  Skin:    Coloration: Skin is pale.  Neurological:     Mental Status: She is lethargic.     Comments: Only opens eyes; does not follow commands    Vital Signs: BP (!) 144/76 (BP Location: Right Arm)   Pulse 88   Temp 98.1 F (36.7 C) (Oral)   Resp 18   Ht 5' (1.524 m)   Wt 50.8 kg   SpO2 94%   BMI 21.87 kg/m  Pain Scale: CPOT   Pain Score: 0-No pain   SpO2: SpO2: 94 % O2 Device:SpO2: 94 % O2 Flow Rate: .   IO: Intake/output summary:  Intake/Output Summary (Last 24 hours) at 05/06/2021 1424 Last data filed at 05/06/2021 0815 Gross per 24 hour  Intake 100 ml  Output --  Net 100 ml    LBM: Last BM Date: 05/04/21 Baseline Weight: Weight: 50.8 kg Most recent weight: Weight: 50.8 kg     Palliative Assessment/Data:     Time In: 1415 Time Out: 1525 Time Total: 70 min Greater than 50%  of this time was spent counseling and coordinating care related to the above assessment and plan.  Signed by: Vinie Sill, NP Palliative Medicine Team Pager # [phone] (M-F 8a-5p) Team Phone # [phone] (Nights/Weekends)

## 2021-05-06 NOTE — TOC Progression Note (Addendum)
Transition of Care (TOC) - Progression Note  Marvetta Gibbons RN,BSN Transitions of Care Unit 4NP (Non Trauma)- RN Case Manager See Treatment Team for direct Phone #    Patient Details  Name: Amanda [name] MRN: [id number] Date of Birth: December 09, 1930  Transition of Care [facility]) CM/SW Contact  Dahlia Client, Romeo Rabon, RN Phone Number: 05/06/2021, 3:25 PM  Clinical Narrative:    Received msg from [facility] that daughter has made decision for comfort care and would like to take pt home w/ hospice services- would like to use [facility]   Call made to [facility] for [facility] referral- spoke with [facility]. Hospice referral pending review for eligibility and reaching out to daughter- will await return call from [place] to confirm services and any DME needs.   1535- received call back from [place] has been accepted for Hospice services- they have spoken with daughter and no DME needs at this time. Daughter is prepared for pt to return home. Hospice can have RN to admit tomorrow, plan will be to d/c in AM and transport home via EMS.  Pt will need G[place] DNR signed for transport.    Expected Discharge Plan: Skilled Nursing Facility Barriers to Discharge: Other (must enter comment) (Awaiting Hospice verification)  Expected Discharge Plan and Services Expected Discharge Plan: [place] In-house Referral: Clinical Social Work Discharge Planning Services: CM Consult Post Acute Care Choice: Hospice Living arrangements for the past 2 months: [place] Date [place]: 05/06/21 Time [place]: [place] Representative spoke with at [place]: [place] Determinants of Health ([place]) Interventions    Readmission Risk Interventions No flowsheet data found.

## 2021-05-07 DIAGNOSIS — I10 Essential (primary) hypertension: Secondary | ICD-10-CM | POA: Diagnosis not present

## 2021-05-07 DIAGNOSIS — S0631AA Contusion and laceration of right cerebrum with loss of consciousness status unknown, initial encounter: Secondary | ICD-10-CM | POA: Diagnosis not present

## 2021-05-07 DIAGNOSIS — Z7401 Bed confinement status: Secondary | ICD-10-CM | POA: Diagnosis not present

## 2021-05-07 DIAGNOSIS — R58 Hemorrhage, not elsewhere classified: Secondary | ICD-10-CM | POA: Diagnosis not present

## 2021-05-07 DIAGNOSIS — I629 Nontraumatic intracranial hemorrhage, unspecified: Secondary | ICD-10-CM | POA: Diagnosis not present

## 2021-05-07 DIAGNOSIS — N183 Chronic kidney disease, stage 3 unspecified: Secondary | ICD-10-CM | POA: Diagnosis not present

## 2021-05-07 DIAGNOSIS — W19XXXA Unspecified fall, initial encounter: Secondary | ICD-10-CM | POA: Diagnosis not present

## 2021-05-07 DIAGNOSIS — Z743 Need for continuous supervision: Secondary | ICD-10-CM | POA: Diagnosis not present

## 2021-05-07 MED ORDER — MORPHINE SULFATE (CONCENTRATE) 10 MG/0.5ML PO SOLN: 5.0000 mg | ORAL | 0 refills | Status: AC | PRN

## 2021-05-07 NOTE — Discharge Summary (Addendum)
Physician Discharge Summary  Amanda [name] AOZ:[id number] DOB: May 11, 1931 DOA: 04/28/2021  PCP: [name], DO  Admit date: 04/28/2021 Discharge date: 05/07/2021  Time spent: 60 minutes  Recommendations for Outpatient Follow-up:  Patient to go home with home hospice Discharge Diagnoses:  Principal Problem:   Intracranial hemorrhage ([place]) Active Problems:   Essential hypertension   GERD   CKD (chronic kidney disease) stage 3, GFR 30-59 ml/min (HCC)   Subarachnoid hemorrhage ([place])   Discharge Condition: Stable  Diet recommendation: Comfort diet  Filed Weights   04/28/21 1507  Weight: 50.8 kg    History of present illness:   87 year old female with history of dementia, hypertension, dyslipidemia recently discharged to skilled from [facility] following hospitalization for traumatic intracranial hemorrhage following a fall.  Presented to [facility] ED with complaints of shortness of breath.  Was found to mild hypoxic to 88% on room air on arrival.  CTA chest in the ED showed small scattered bilateral pulmonary emboli and CT head showed acute hemorrhage in the anterior right frontal lobe she was sent to the [facility] for further evaluation.  Seen by neurosurgery, started on low-dose heparin drip without bolus.  She remained stable on IV heparin, repeat imaging was stable, ultimately transitioned to oral Eliquis. She was found to be lethargic on 11/29, started on IV ceftriaxone for worsening leukocytosis and cloudy urine.   Hospital Course:  Intracranial hemorrhage -Secondary to fall -Neurosurgery consulted -Patient started on low-dose heparin for pulmonary embolism: Switched to  Eliquis -Repeat CT head unremarkable, neurosurgery has signed off -Recommend to continue with Eliquis -Since patient is on full comfort measures only, will discontinue Eliquis   Acute toxic encephalopathy/UTI -Improved clinically -Patient was poorly responsive yesterday, was not  interacting with minimal oral intake -UA obtained yesterday was cloudy, more than 50 RBCs per high-power field -Patient started on IV ceftriaxone; urine culture showed multiple species -Patient has clinically improved with IV ceftriaxone -Patient now comfort care, will discontinue IV antibiotics   Leukocytosis -WBC elevated at 18,000 -?  Infectious etiology -She is afebrile, started on IV ceftriaxone  -No antibiotics as above   Pulmonary embolism New diagnosis, found on CTA which showed few scattered tiny pulmonary emboli extending filling defects -Started on low-dose heparin -Transitioned to p.o. apixaban -Risk of bleeding outweighs benefits at this time, patient is going home with hospice will discontinue apixaban as above   Acute kidney injury -Creatinine is elevated at 1.76 -No further intervention recommended -   Acute hypoxemic respiratory failure -Secondary to PE as above    Dementia -Stable   Hyperlipidemia -Discontinue statin  Hypertension -Continue verapamil   Goals of care -Daughter discussed with palliative care, plan to send home patient with home hospice.  We will continue with morphine 5 mg every 4 hours as needed for pain/dyspnea  Procedures: None  Consultations: Palliative care  Discharge Exam: Vitals:   05/06/21 2124 05/07/21 0810  BP: 136/79 126/71  Pulse:  95  Resp:  14  Temp:  97.6 F (36.4 C)  SpO2:  97%    General: Appears in no acute distress Cardiovascular: S1-S2, regular, no murmur auscultated Respiratory: Clear to auscultation bilaterally  Discharge Instructions   Discharge Instructions     Diet - low sodium heart healthy   Complete by: As directed    Increase activity slowly   Complete by: As directed       Allergies as of 05/07/2021       Reactions   Livalo [  pitavastatin] Other (See Comments)   Causes dizziness   Simvastatin Other (See Comments)   Causes dizziness   Lisinopril    Hallucinations, resolved on  ARB        Medication List     STOP taking these medications    atorvastatin 40 MG tablet Commonly known as: LIPITOR   iron polysaccharides 150 MG capsule Commonly known as: NIFEREX   VITAMIN D PO       TAKE these medications    aspirin EC 81 MG tablet Take 81 mg by mouth daily. Swallow whole.   feeding supplement Liqd Take 237 mLs by mouth 2 (two) times daily between meals.   guaiFENesin 600 MG 12 hr tablet Commonly known as: MUCINEX Take by mouth 2 (two) times daily.   morphine CONCENTRATE 10 MG/0.5ML Soln concentrated solution Take 0.25 mLs (5 mg total) by mouth every 4 (four) hours as needed for severe pain or shortness of breath.   omeprazole 20 MG capsule Commonly known as: PRILOSEC TAKE 1 CAPSULE (20 MG TOTAL) BY MOUTH 2 (TWO) TIMES DAILY BEFORE A MEAL.   solifenacin 5 MG tablet Commonly known as: VESICARE Take 1 tablet (5 mg total) by mouth daily.   verapamil 180 MG CR tablet Commonly known as: CALAN-SR Take 1 tablet (180 mg total) by mouth at bedtime.       Allergies  Allergen Reactions   Livalo [Pitavastatin] Other (See Comments)    Causes dizziness   Simvastatin Other (See Comments)    Causes dizziness   Lisinopril     Hallucinations, resolved on ARB    Follow-up [place] Follow up.   Why: Referral made for Home Hospice needs Contact information: [address] [place] [address] [phone]                  The results of significant diagnostics from this hospitalization (including imaging, microbiology, ancillary and laboratory) are listed below for reference.    Significant Diagnostic Studies: CT HEAD WO CONTRAST (5MM)  Result Date: 05/04/2021 CLINICAL DATA:  Encephalopathy EXAM: CT HEAD WITHOUT CONTRAST TECHNIQUE: Contiguous axial images were obtained from the base of the skull through the vertex without intravenous contrast. COMPARISON:  04/30/2021 FINDINGS: Brain: There is no  mass, hemorrhage or extra-axial collection. There is generalized atrophy without lobar predilection. Hypodensity of the white matter is most commonly associated with chronic microvascular disease. Vascular: No abnormal hyperdensity of the major intracranial arteries or dural venous sinuses. No intracranial atherosclerosis. Skull: The visualized skull base, calvarium and extracranial soft tissues are normal. Sinuses/Orbits: No fluid levels or advanced mucosal thickening of the visualized paranasal sinuses. No mastoid or middle ear effusion. The orbits are normal. IMPRESSION: Generalized atrophy and chronic microvascular ischemia without acute intracranial abnormality. Electronically Signed   By: Ulyses Jarred M.D.   On: 05/04/2021 19:01   CT HEAD WO CONTRAST (5MM)  Result Date: 04/30/2021 CLINICAL DATA:  Cerebral hemorrhage suspected EXAM: CT HEAD WITHOUT CONTRAST TECHNIQUE: Contiguous axial images were obtained from the base of the skull through the vertex without intravenous contrast. COMPARISON:  04/28/2021 FINDINGS: Brain: Moderate atrophy. Moderate chronic microvascular ischemic changes. Negative for acute infarct, or mass. No acute hemorrhage. Interval resolution of small amount of residual subarachnoid hemorrhage in the right frontal lobe. Vascular: Negative for hyperdense vessel Skull: Negative Sinuses/Orbits: Mucosal edema paranasal sinuses. Retention cyst left maxillary sinus. Bilateral cataract extraction Other: None IMPRESSION: No acute abnormality Interval resolution of hyperdensity in the  right frontal lobe which may be resolving subarachnoid hemorrhage. Electronically Signed   By: Franchot Gallo M.D.   On: 04/30/2021 14:12   CT HEAD WO CONTRAST (5MM)  Result Date: 04/28/2021 CLINICAL DATA:  Cerebral hemorrhage suspected EXAM: CT HEAD WITHOUT CONTRAST TECHNIQUE: Contiguous axial images were obtained from the base of the skull through the vertex without intravenous contrast. COMPARISON:   03/12/2021. FINDINGS: Brain: Small area of hyperdense material in the anterior right frontal lobe (series 2, image 14). Additional previously noted hyperdense areas are no longer seen. No acute infarct. No mass, mass effect, or midline shift. Slight increase in size of lateral and third ventricles compared to 03/12/2021. Periventricular white matter changes, likely the sequela of chronic small vessel ischemic disease. Vascular: Contrast is noted within the vascular system after prior contrasted study. Skull: Normal. Negative for fracture or focal lesion. Sinuses/Orbits: Mild mucosal thickening in the ethmoid air cells. Status post bilateral lens replacements. Other: The mastoids are well aerated. IMPRESSION: 1. Hyperdensity in the anterior right frontal lobe. Although the patient received intravenous contrast today, this is felt most likely to be acute hemorrhage. This is in an area that previously demonstrated parenchymal hemorrhage/contusion, although that hemorrhage has resolved, as has hemorrhage in the left anterior frontal lobe. No mass effect or midline shift. 2. Slight increase in size of the ventricular system, without definite hydrocephalus. Attention on follow-up. These results were called by telephone at the time of interpretation on 04/28/2021 at 7:11 pm to provider Dr. Gilford Raid, Who verbally acknowledged these results. Electronically Signed   By: Merilyn Baba M.D.   On: 04/28/2021 19:13   CT Angio Chest PE W and/or Wo Contrast  Result Date: 04/28/2021 CLINICAL DATA:  Low oxygen saturation question pulmonary embolism, recent pneumonia EXAM: CT ANGIOGRAPHY CHEST WITH CONTRAST TECHNIQUE: Multidetector CT imaging of the chest was performed using the standard protocol during bolus administration of intravenous contrast. Multiplanar CT image reconstructions and MIPs were obtained to evaluate the vascular anatomy. CONTRAST:  21mL OMNIPAQUE IOHEXOL 350 MG/ML SOLN COMPARISON:  None FINDINGS:  Cardiovascular: Atherosclerotic calcifications aorta, coronary arteries, and proximal great vessels. Aberrant origin of the RIGHT subclavian artery. No evidence of aortic aneurysm or dissection. Heart appears mildly enlarged. Minimal pericardial fluid. Pulmonary arteries adequately opacified. Several tiny filling defects are seen within RIGHT upper and RIGHT middle lobe pulmonary arteries. In addition, eccentric filling defects are seen within the LEFT lower lobe pulmonary artery and the RIGHT upper lobe pulmonary artery. Findings are consistent with pulmonary embolism, though this may represent a combination of acute and old emboli. Mediastinum/Nodes: Base of cervical region normal appearance. Esophagus unremarkable. No thoracic adenopathy. Lungs/Pleura: Dependent atelectasis bilaterally. No pulmonary infiltrate, pleural effusion, or pneumothorax. Upper Abdomen: Visualized upper abdomen unremarkable Musculoskeletal: Osseous structures normal appearance. Review of the MIP images confirms the above findings. IMPRESSION: Few scattered tiny pulmonary emboli are identified with additional eccentric filling defects which may represent sequela of new or old pulmonary emboli. Scattered atherosclerotic calcifications including coronary arteries. Aberrant origin of the RIGHT subclavian artery. Dependent atelectasis BILATERAL lower lobes. Aortic Atherosclerosis (ICD10-I70.0). Critical Value/emergent results were called by telephone at the time of interpretation on 04/28/2021 at 5:26 pm to provider Isla Pence MD, who verbally acknowledged these results. Electronically Signed   By: Lavonia Dana M.D.   On: 04/28/2021 17:26   US PELVIS (TRANSABDOMINAL ONLY)  Result Date: 05/05/2021 CLINICAL DATA:  Vaginal discharge. EXAM: TRANSABDOMINAL ULTRASOUND OF PELVIS TECHNIQUE: Transabdominal ultrasound examination of the pelvis was performed including evaluation  of the uterus, ovaries, adnexal regions, and pelvic cul-de-sac.  COMPARISON:  None. FINDINGS: Evaluation is very limited as the patient could not cooperate with exam. Uterus Hysterectomy. Endometrium Hysterectomy. Right ovary Not visualized. Left ovary Not visualized. Other findings: There is large amount of layering debris within the urinary bladder. Correlation with clinical exam and urinalysis recommended. IMPRESSION: 1. Large debris within the urinary bladder. 2. Hysterectomy. 3. Nonvisualization of the ovaries. Electronically Signed   By: Anner Crete M.D.   On: 05/05/2021 23:31   DG Chest Port 1 View  Result Date: 04/28/2021 CLINICAL DATA:  Shortness of breath EXAM: PORTABLE CHEST 1 VIEW COMPARISON:  04/09/2013, 02/22/2016 report FINDINGS: No focal opacity or pleural effusion. Cardiomediastinal silhouette within normal limits. Aortic atherosclerosis. No pneumothorax. IMPRESSION: No active disease. Electronically Signed   By: Donavan Foil M.D.   On: 04/28/2021 15:32    Microbiology: Recent Results (from the past 240 hour(s))  Resp Panel by RT-PCR (Flu A&B, Covid) Nasopharyngeal Swab     Status: None   Collection Time: 04/28/21  3:17 PM   Specimen: Nasopharyngeal Swab; Nasopharyngeal(NP) swabs in vial transport medium  Result Value Ref Range Status   SARS Coronavirus 2 by RT PCR NEGATIVE NEGATIVE Final    Comment: (NOTE) SARS-CoV-2 target nucleic acids are NOT DETECTED.  The SARS-CoV-2 RNA is generally detectable in upper respiratory specimens during the acute phase of infection. The lowest concentration of SARS-CoV-2 viral copies this assay can detect is 138 copies/mL. A negative result does not preclude SARS-Cov-2 infection and should not be used as the sole basis for treatment or other patient management decisions. A negative result may occur with  improper specimen collection/handling, submission of specimen other than nasopharyngeal swab, presence of viral mutation(s) within the areas targeted by this assay, and inadequate number of  viral copies(<138 copies/mL). A negative result must be combined with clinical observations, patient history, and epidemiological information. The expected result is Negative.  Fact Sheet for Patients:  EntrepreneurPulse.com.au  Fact Sheet for Healthcare Providers:  IncredibleEmployment.be  This test is no t yet approved or cleared by the [place] FDA and  has been authorized for detection and/or diagnosis of SARS-CoV-2 by FDA under an Emergency Use Authorization (EUA). This EUA will remain  in effect (meaning this test can be used) for the duration of the COVID-19 declaration under Section 564(b)(1) of the Act, 21 U.S.C.section 360bbb-3(b)(1), unless the authorization is terminated  or revoked sooner.       Influenza A by PCR NEGATIVE NEGATIVE Final   Influenza B by PCR NEGATIVE NEGATIVE Final    Comment: (NOTE) The Xpert Xpress SARS-CoV-2/FLU/RSV plus assay is intended as an aid in the diagnosis of influenza from Nasopharyngeal swab specimens and should not be used as a sole basis for treatment. Nasal washings and aspirates are unacceptable for Xpert Xpress SARS-CoV-2/FLU/RSV testing.  Fact Sheet for Patients: EntrepreneurPulse.com.au  Fact Sheet for Healthcare Providers: IncredibleEmployment.be  This test is not yet approved or cleared by the [place] FDA and has been authorized for detection and/or diagnosis of SARS-CoV-2 by FDA under an Emergency Use Authorization (EUA). This EUA will remain in effect (meaning this test can be used) for the duration of the COVID-19 declaration under Section 564(b)(1) of the Act, 21 U.S.C. section 360bbb-3(b)(1), unless the authorization is terminated or revoked.  Performed at [facility], [address], [place], [place] [address]   Culture, blood (routine x 2)     Status: None   Collection Time: 04/28/21  3:56 PM   Specimen: BLOOD RIGHT ARM  Result  Value Ref Range Status   Specimen Description BLOOD RIGHT ARM  Final   Special Requests   Final    BOTTLES DRAWN AEROBIC AND ANAEROBIC Blood Culture adequate volume   Culture   Final    NO GROWTH 5 DAYS Performed at [facility], [address] [place], [place] [address]    Report Status 05/03/2021 FINAL  Final  Culture, blood (routine x 2)     Status: None   Collection Time: 04/28/21  3:56 PM   Specimen: BLOOD LEFT HAND  Result Value Ref Range Status   Specimen Description BLOOD LEFT HAND  Final   Special Requests   Final    BOTTLES DRAWN AEROBIC AND ANAEROBIC Blood Culture adequate volume   Culture   Final    NO GROWTH 5 DAYS Performed at [facility], [address], [place], [place] [address]    Report Status 05/03/2021 FINAL  Final  Urine Culture     Status: Abnormal   Collection Time: 05/04/21 12:39 PM   Specimen: Urine, Clean Catch  Result Value Ref Range Status   Specimen Description URINE, CLEAN CATCH  Final   Special Requests   Final    NONE Performed at [facility], [place] [address], [place], [place] [address]    Culture MULTIPLE SPECIES PRESENT, SUGGEST RECOLLECTION (A)  Final   Report Status 05/05/2021 FINAL  Final     Labs: Basic Metabolic Panel: Recent Labs  Lab 05/02/21 0239 05/03/21 0556 05/04/21 1258 05/05/21 0211 05/06/21 0133  NA 139 139 139 136 138  K 3.7 3.9 4.2 4.1 3.8  CL 107 105 106 107 110  CO2 25 27 25 22  19*  GLUCOSE 98 103* 118* 109* 133*  BUN 19 21 28* 29* 31*  CREATININE 1.05* 1.20* 1.54* 1.40* 1.76*  CALCIUM 11.7* 12.3* 12.3* 11.4* 10.8*   Liver Function Tests: No results for input(s): AST, ALT, ALKPHOS, BILITOT, PROT, ALBUMIN in the last 168 hours. No results for input(s): LIPASE, AMYLASE in the last 168 hours. No results for input(s): AMMONIA in the last 168 hours. CBC: Recent Labs  Lab 05/02/21 0239 05/03/21 0556 05/04/21 1258 05/05/21 0211 05/06/21 0133  WBC 6.6 9.2 13.2* 14.5* 18.5*  HGB 11.4* 12.7 13.5 13.1  12.8  HCT 35.4* 38.6 41.5 39.5 38.8  MCV 94.7 93.9 93.7 93.6 93.7  PLT 244 253 278 247 250   Cardiac Enzymes: No results for input(s): CKTOTAL, CKMB, CKMBINDEX, TROPONINI in the last 168 hours. BNP: BNP (last 3 results) Recent Labs    04/28/21 1519  BNP 57.0    ProBNP (last 3 results) No results for input(s): PROBNP in the last 8760 hours.  CBG: No results for input(s): GLUCAP in the last 168 hours.     Signed:  Oswald Hillock MD.  [facility] 05/07/2021, 9:50 AM

## 2021-05-07 NOTE — TOC Transition Note (Signed)
Transition of Care (TOC) - CM/SW Discharge Note Marvetta Gibbons RN,BSN Transitions of Care Unit 4NP (Non Trauma)- RN Case Manager See Treatment Team for direct Phone #    Patient Details  Name: Amanda [name] MRN: [id number] Date of Birth: 24-Mar-1931  Transition of Care [facility]) CM/SW Contact:  Dawayne Patricia, RN Phone Number: 05/07/2021, 11:04 AM   Clinical Narrative:    Pt stable for transition howe w/ Hospice. Services have been confirmed with [facility] per [place] on 12/1. No DME needs per daughter at this time.  Pt to transport home via EMS- [facility] has been called to place pt on list for transport (1100am). Paper work for transport as been placed on chart along with GOLD DNR.   Daughter called to confirm address and inform that transport has been called.    Final next level of care: Home w Hospice Care Barriers to Discharge: Barriers Resolved   Patient Goals and CMS Choice Patient states their goals for this hospitalization and ongoing recovery are:: Comfort care- home w/ Hospice CMS Medicare.gov Compare Post Acute Care list provided to:: Patient Represenative (must comment) (Daughter) Choice offered to / list presented to : Adult Children  Discharge Placement                 Home w/ Hospice.       Discharge Plan and Services In-house Referral: Clinical Social Work Discharge Planning Services: CM Consult Post Acute Care Choice: Hospice            DME Agency: NA         HH Agency: [facility] Date [place]: 05/06/21 Time [place]: 6269 Representative spoke with at [place]: [place] ([place]) Interventions     Readmission Risk Interventions Readmission Risk Prevention Plan 05/07/2021  Transportation Screening Complete  PCP or Specialist Appt within 5-7 Days Complete  Home Care Screening Complete  Medication Review (RN CM) Complete  Some recent data might be hidden

## 2021-05-07 NOTE — Consult Note (Signed)
   [facility] Inpatient Consult   05/07/2021  [name] May 24, 1931 [id number]  [place] [facility] [[facility]] Patient: Amanda [name]  Notification will be sent to the [place] Management  team regarding patient now for comfort care at home with Hospice. Please contact for further questions,  Natividad Brood, RN BSN [facility] Liaison  [phone] business mobile phone Toll free office [phone]  Fax number: [phone] [email or web address]

## 2021-05-07 NOTE — Care Management Important Message (Signed)
Important Message  Patient Details  Name: Amanda [name] MRN: [id number] Date of Birth: 04-20-1931   Medicare Important Message Given:  Yes     Katalaya Beel 05/07/2021, 1:15 PM

## 2021-05-07 NOTE — Progress Notes (Addendum)
Pt being discharged home via [facility] with hospice. Pt alert. VSS. Pt c/o no pain at this time. No signs of respiratory distress. Education complete and care plans resolved. IV removed with catheter intact and pt tolerated well. No further issues at this time. Daughter notified of transport on the way. Leanne Chang, RN

## 2021-05-10 ENCOUNTER — Telehealth: Payer: Self-pay | Admitting: Family Medicine

## 2021-05-18 ENCOUNTER — Ambulatory Visit: Payer: Medicare HMO | Admitting: Family Medicine

## 2021-05-28 ENCOUNTER — Other Ambulatory Visit (HOSPITAL_COMMUNITY): Payer: Medicare HMO

## 2021-06-06 DEATH — deceased

## 2022-04-18 ENCOUNTER — Ambulatory Visit: Payer: Medicare HMO | Admitting: Urology

## 2022-07-31 IMAGING — CT CT HEAD W/O CM
3 series · 15 of 47 positions shown, 18 images · non-contrast
Comparison: 04/28/2021

CLINICAL DATA: Cerebral hemorrhage suspected

EXAM:
CT HEAD WITHOUT CONTRAST
TECHNIQUE: Contiguous axial images were obtained from the base of the skull
through the vertex without intravenous contrast.

[Series 3: head 5.0 h30s · axial · 0.42mm/px · z∈[-177,-37]mm · 9 of 34 slices shown, 12 images]
[im 3/34  brain]
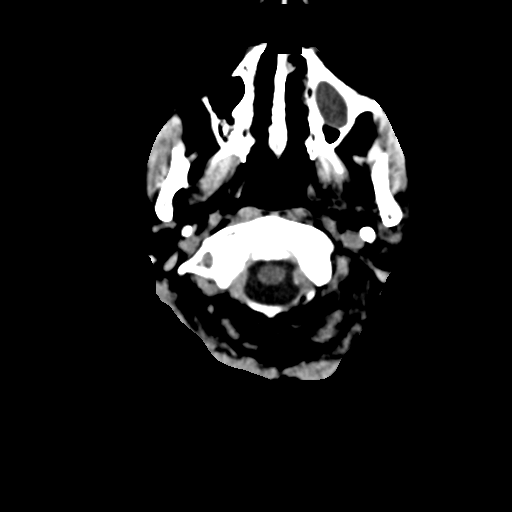
[im 3/34  bone]
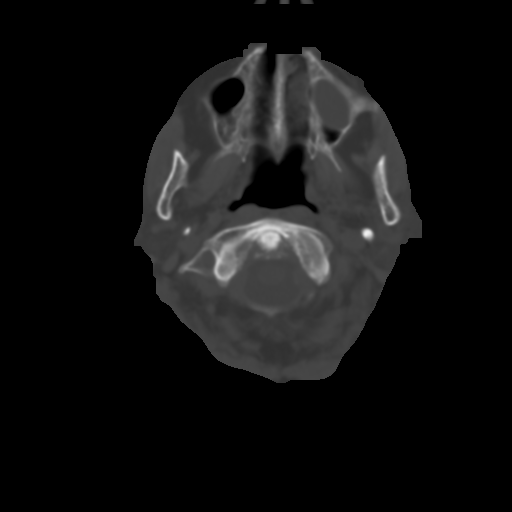
[im 6/34  brain]
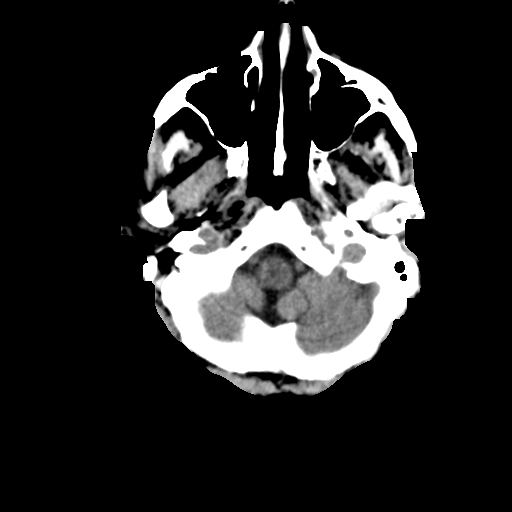
[im 10/34  brain]
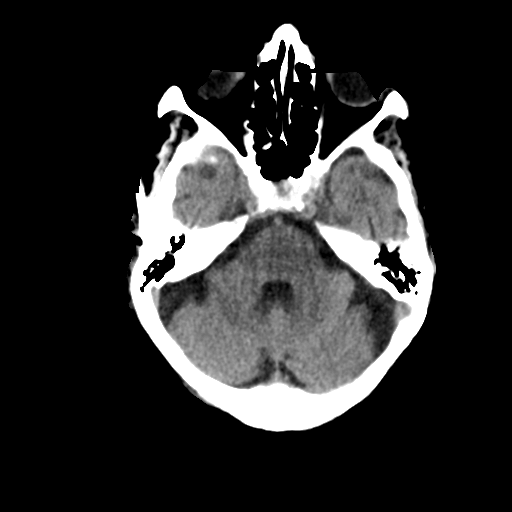
[im 13/34  brain]
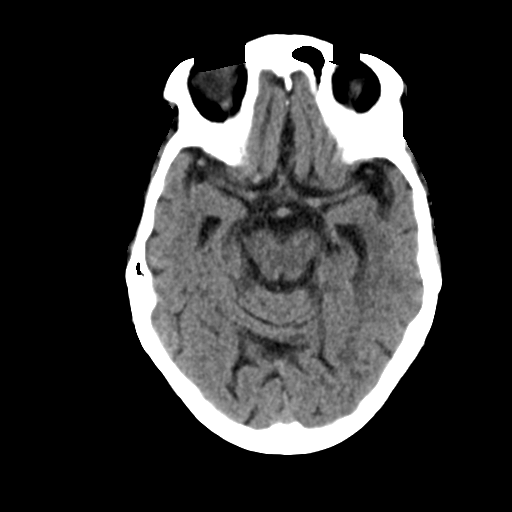
[im 18/34  brain]
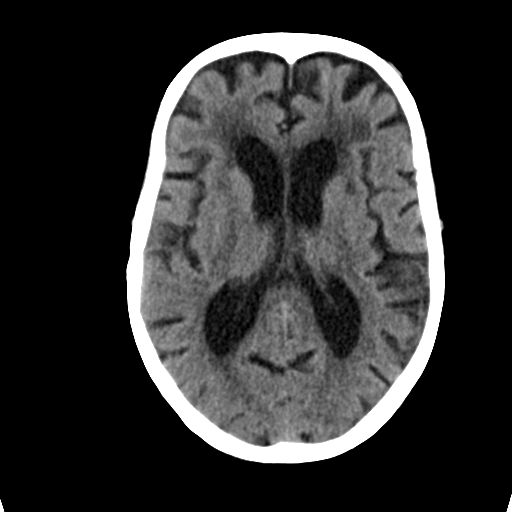
[im 18/34  bone]
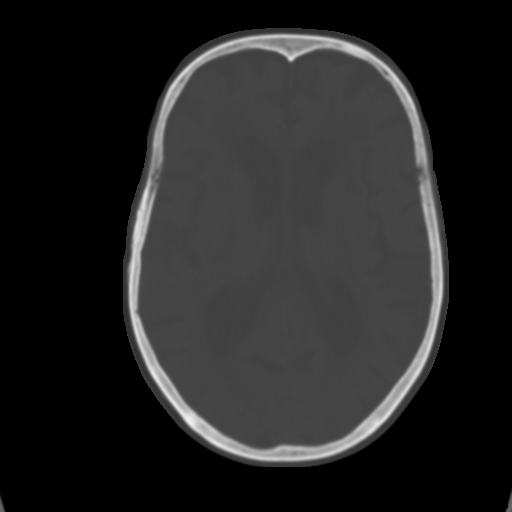
[im 21/34  brain]
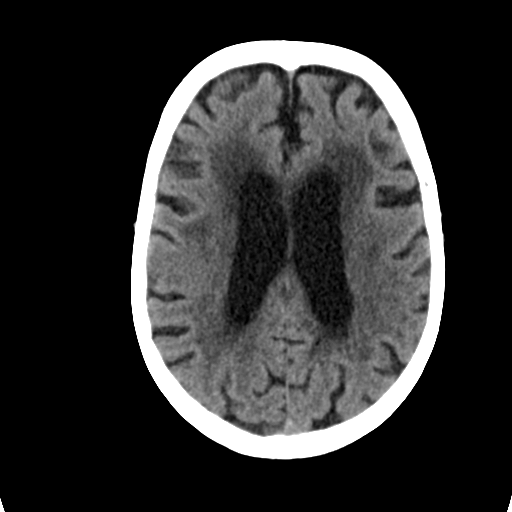
[im 24/34  brain]
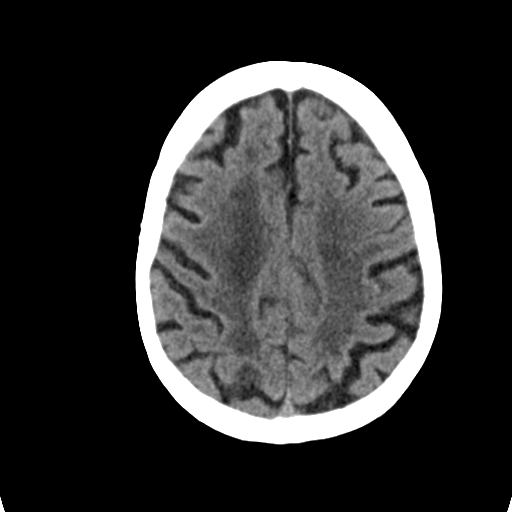
[im 28/34  brain]
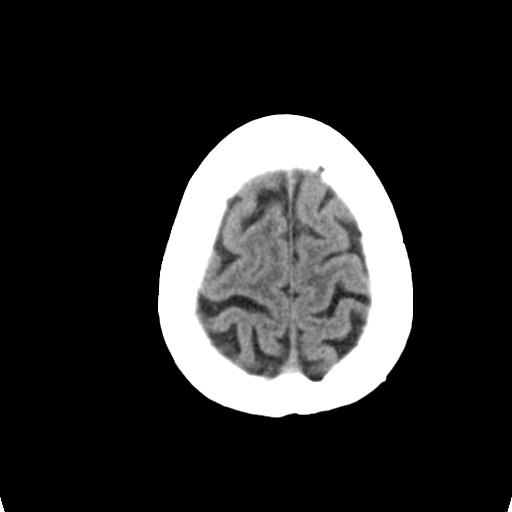
[im 31/34  brain]
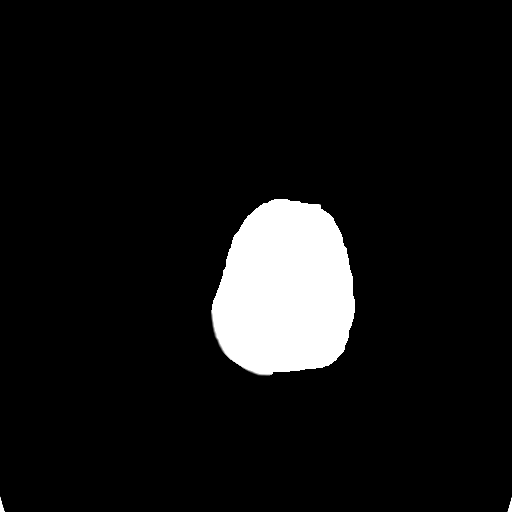
[im 31/34  bone]
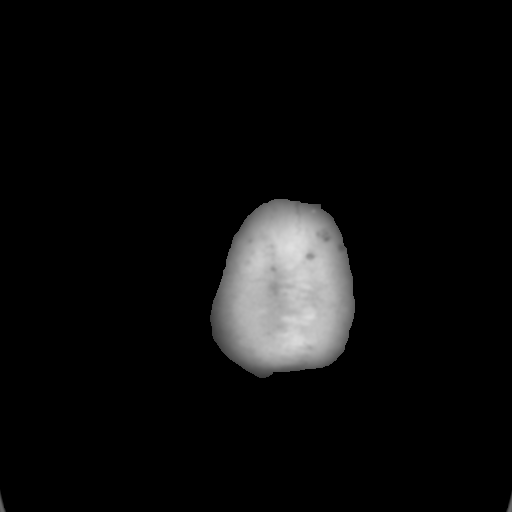

[Series 5: head 3.0 mpr cor · coronal · 0.32mm/px · 3 of 67 slices shown]
[im 23/67  brain]
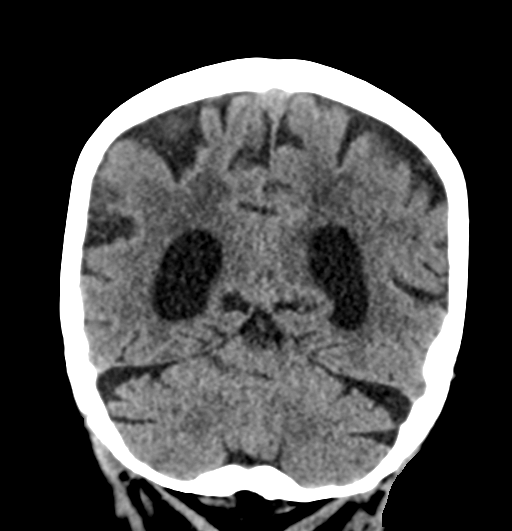
[im 30/67  brain]
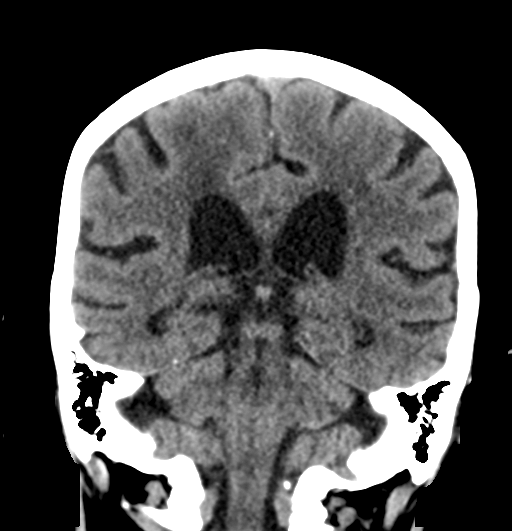
[im 37/67  brain]
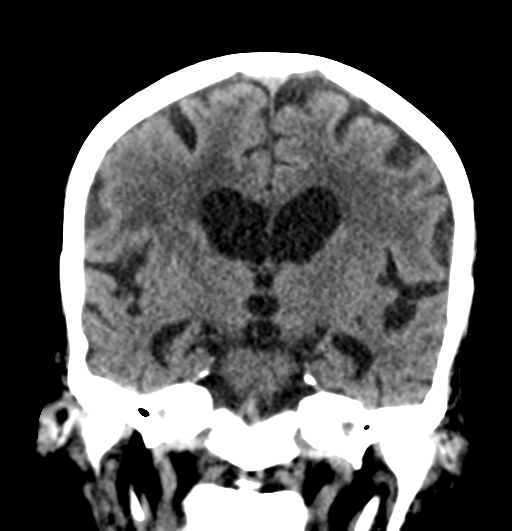

[Series 6: head 3.0 mpr sag · sagittal · 0.33mm/px · 3 of 67 slices shown]
[im 23/67  brain]
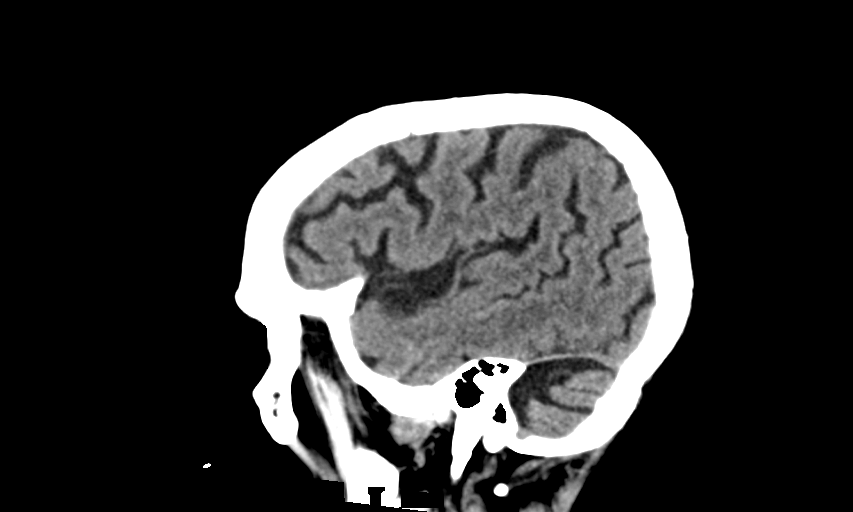
[im 34/67  brain]
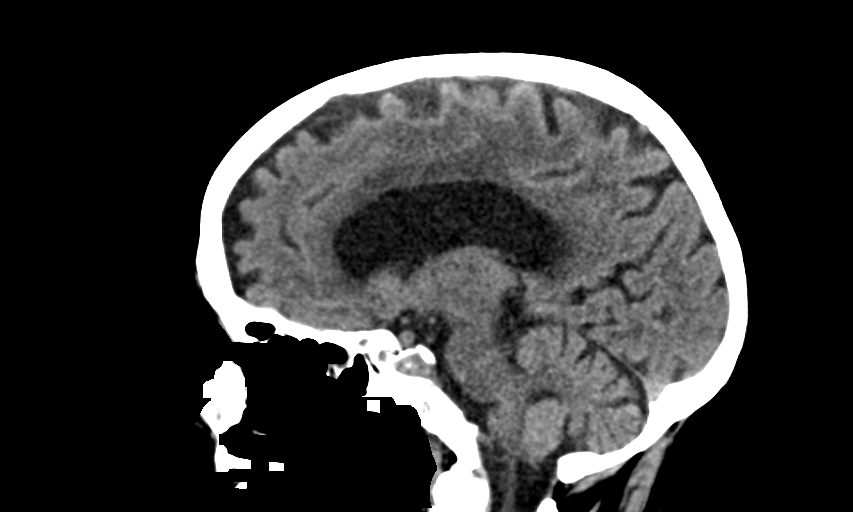
[im 45/67  brain]
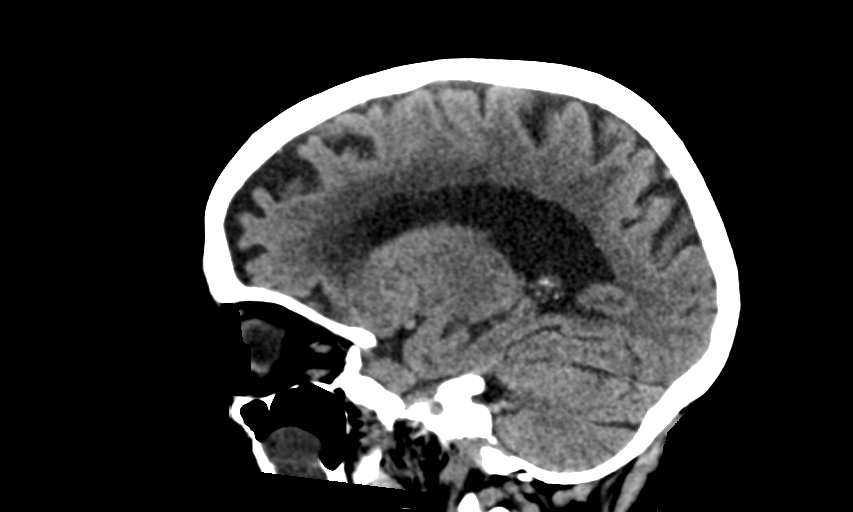

[15 of 47 positions shown; findings below may reference images not displayed]

FINDINGS: Brain: Moderate atrophy. Moderate chronic microvascular ischemic
changes.

Negative for acute infarct, or mass. No acute hemorrhage. Interval
resolution of small amount of residual subarachnoid hemorrhage in
the right frontal lobe.

Vascular: Negative for hyperdense vessel

Skull: Negative

Sinuses/Orbits: Mucosal edema paranasal sinuses. Retention cyst left
maxillary sinus. Bilateral cataract extraction

Other: None
IMPRESSION: No acute abnormality

Interval resolution of hyperdensity in the right frontal lobe which
may be resolving subarachnoid hemorrhage.
